# Patient Record
Sex: Male | Born: 1955 | Race: White | Hispanic: No | Marital: Married | Smoking: Former smoker
Health system: Southern US, Community
[De-identification: ages and names within clinical notes are randomized; demographics above are authoritative.]

## PROBLEM LIST (undated history)

## (undated) DIAGNOSIS — M1990 Unspecified osteoarthritis, unspecified site: Secondary | ICD-10-CM

## (undated) DIAGNOSIS — E039 Hypothyroidism, unspecified: Secondary | ICD-10-CM

## (undated) DIAGNOSIS — K6389 Other specified diseases of intestine: Secondary | ICD-10-CM

## (undated) DIAGNOSIS — C801 Malignant (primary) neoplasm, unspecified: Secondary | ICD-10-CM

## (undated) DIAGNOSIS — D1391 Familial adenomatous polyposis: Secondary | ICD-10-CM

## (undated) DIAGNOSIS — E785 Hyperlipidemia, unspecified: Secondary | ICD-10-CM

## (undated) DIAGNOSIS — I10 Essential (primary) hypertension: Secondary | ICD-10-CM

## (undated) DIAGNOSIS — R0981 Nasal congestion: Secondary | ICD-10-CM

## (undated) DIAGNOSIS — R011 Cardiac murmur, unspecified: Secondary | ICD-10-CM

## (undated) DIAGNOSIS — D126 Benign neoplasm of colon, unspecified: Secondary | ICD-10-CM

## (undated) DIAGNOSIS — D649 Anemia, unspecified: Secondary | ICD-10-CM

## (undated) HISTORY — DX: Benign neoplasm of colon, unspecified: D12.6

## (undated) HISTORY — PX: UPPER GASTROINTESTINAL ENDOSCOPY: SHX188

## (undated) HISTORY — PX: WISDOM TOOTH EXTRACTION: SHX21

## (undated) HISTORY — DX: Unspecified osteoarthritis, unspecified site: M19.90

## (undated) HISTORY — DX: Other specified diseases of intestine: K63.89

## (undated) HISTORY — DX: Familial adenomatous polyposis: D13.91

## (undated) HISTORY — DX: Nasal congestion: R09.81

## (undated) HISTORY — DX: Essential (primary) hypertension: I10

## (undated) HISTORY — DX: Hyperlipidemia, unspecified: E78.5

---

## 1996-08-06 HISTORY — PX: KNEE ARTHROSCOPY: SUR90

## 1996-12-04 ENCOUNTER — Encounter: Payer: Self-pay | Admitting: Family Medicine

## 1997-05-06 ENCOUNTER — Encounter: Payer: Self-pay | Admitting: Family Medicine

## 1998-05-06 ENCOUNTER — Encounter: Payer: Self-pay | Admitting: Family Medicine

## 1998-05-06 LAB — CONVERTED CEMR LAB: Hgb A1c MFr Bld: 7.3 %

## 1999-05-07 ENCOUNTER — Encounter: Payer: Self-pay | Admitting: Family Medicine

## 2000-03-06 ENCOUNTER — Encounter: Payer: Self-pay | Admitting: Family Medicine

## 2000-08-28 ENCOUNTER — Other Ambulatory Visit: Admission: RE | Admit: 2000-08-28 | Discharge: 2000-08-28 | Payer: Self-pay | Admitting: Gastroenterology

## 2000-08-28 ENCOUNTER — Encounter (INDEPENDENT_AMBULATORY_CARE_PROVIDER_SITE_OTHER): Payer: Self-pay

## 2000-09-06 ENCOUNTER — Encounter: Payer: Self-pay | Admitting: Family Medicine

## 2000-09-06 LAB — CONVERTED CEMR LAB
Hgb A1c MFr Bld: 6.3 %
Microalbumin U total vol: 3.1 mg/L

## 2001-03-06 ENCOUNTER — Encounter: Payer: Self-pay | Admitting: Family Medicine

## 2001-03-06 LAB — CONVERTED CEMR LAB: Hgb A1c MFr Bld: 6.1 %

## 2001-05-06 ENCOUNTER — Encounter: Payer: Self-pay | Admitting: Family Medicine

## 2001-05-06 LAB — CONVERTED CEMR LAB: Hgb A1c MFr Bld: 4.9 %

## 2001-11-04 ENCOUNTER — Encounter: Payer: Self-pay | Admitting: Family Medicine

## 2001-11-04 LAB — CONVERTED CEMR LAB: Microalbumin U total vol: 2.1 mg/L

## 2002-04-06 ENCOUNTER — Encounter: Payer: Self-pay | Admitting: Family Medicine

## 2003-03-07 ENCOUNTER — Encounter: Payer: Self-pay | Admitting: Family Medicine

## 2004-01-05 ENCOUNTER — Encounter: Payer: Self-pay | Admitting: Family Medicine

## 2004-06-23 ENCOUNTER — Ambulatory Visit: Payer: Self-pay | Admitting: Family Medicine

## 2004-07-06 ENCOUNTER — Encounter: Payer: Self-pay | Admitting: Family Medicine

## 2004-07-26 ENCOUNTER — Ambulatory Visit: Payer: Self-pay | Admitting: Family Medicine

## 2004-07-28 ENCOUNTER — Ambulatory Visit: Payer: Self-pay | Admitting: Family Medicine

## 2005-02-03 ENCOUNTER — Encounter: Payer: Self-pay | Admitting: Family Medicine

## 2005-02-03 LAB — CONVERTED CEMR LAB: Hgb A1c MFr Bld: 6.8 %

## 2005-02-13 ENCOUNTER — Ambulatory Visit: Payer: Self-pay | Admitting: Family Medicine

## 2005-11-04 ENCOUNTER — Encounter: Payer: Self-pay | Admitting: Family Medicine

## 2005-11-04 LAB — CONVERTED CEMR LAB: Microalbumin U total vol: 4.6 mg/L

## 2005-11-05 ENCOUNTER — Ambulatory Visit: Payer: Self-pay | Admitting: Family Medicine

## 2005-11-07 ENCOUNTER — Ambulatory Visit: Payer: Self-pay | Admitting: Family Medicine

## 2006-04-10 ENCOUNTER — Ambulatory Visit: Payer: Self-pay | Admitting: Family Medicine

## 2006-04-10 LAB — CONVERTED CEMR LAB
Hgb A1c MFr Bld: 6.6 %
PSA: 0.03 ng/mL

## 2006-04-12 ENCOUNTER — Ambulatory Visit: Payer: Self-pay | Admitting: Family Medicine

## 2006-06-26 ENCOUNTER — Ambulatory Visit: Payer: Self-pay | Admitting: Gastroenterology

## 2007-02-27 ENCOUNTER — Encounter: Payer: Self-pay | Admitting: Family Medicine

## 2007-02-27 DIAGNOSIS — E669 Obesity, unspecified: Secondary | ICD-10-CM

## 2007-02-27 DIAGNOSIS — L259 Unspecified contact dermatitis, unspecified cause: Secondary | ICD-10-CM | POA: Insufficient documentation

## 2007-02-27 DIAGNOSIS — D126 Benign neoplasm of colon, unspecified: Secondary | ICD-10-CM

## 2007-03-27 ENCOUNTER — Ambulatory Visit: Payer: Self-pay | Admitting: Family Medicine

## 2007-06-02 ENCOUNTER — Ambulatory Visit: Payer: Self-pay | Admitting: Family Medicine

## 2007-07-29 ENCOUNTER — Ambulatory Visit: Payer: Self-pay | Admitting: Family Medicine

## 2007-07-29 LAB — CONVERTED CEMR LAB: Hgb A1c MFr Bld: 7.4 % — ABNORMAL HIGH (ref 4.6–6.0)

## 2007-08-04 ENCOUNTER — Ambulatory Visit: Payer: Self-pay | Admitting: Family Medicine

## 2007-08-04 DIAGNOSIS — I10 Essential (primary) hypertension: Secondary | ICD-10-CM

## 2007-08-14 ENCOUNTER — Telehealth (INDEPENDENT_AMBULATORY_CARE_PROVIDER_SITE_OTHER): Payer: Self-pay | Admitting: *Deleted

## 2008-02-12 ENCOUNTER — Encounter: Admission: RE | Admit: 2008-02-12 | Discharge: 2008-02-12 | Payer: Self-pay | Admitting: Family Medicine

## 2008-02-12 ENCOUNTER — Ambulatory Visit: Payer: Self-pay | Admitting: Family Medicine

## 2008-04-08 ENCOUNTER — Ambulatory Visit: Payer: Self-pay | Admitting: Family Medicine

## 2008-04-08 LAB — CONVERTED CEMR LAB
Albumin: 3.5 g/dL (ref 3.5–5.2)
Alkaline Phosphatase: 65 units/L (ref 39–117)
BUN: 21 mg/dL (ref 6–23)
Cholesterol: 138 mg/dL (ref 0–200)
GFR calc Af Amer: 154 mL/min
Glucose, Bld: 144 mg/dL — ABNORMAL HIGH (ref 70–99)
HDL: 27.6 mg/dL — ABNORMAL LOW (ref >39.0)
Microalb Creat Ratio: 3.8 mg/g (ref 0.0–30.0)
Potassium: 4.2 meq/L (ref 3.5–5.1)
Total Protein: 7.9 g/dL (ref 6.0–8.3)
Triglycerides: 97 mg/dL (ref 0–149)
VLDL: 19 mg/dL (ref 0–40)

## 2008-04-13 ENCOUNTER — Ambulatory Visit: Payer: Self-pay | Admitting: Family Medicine

## 2008-04-30 ENCOUNTER — Ambulatory Visit: Payer: Self-pay | Admitting: Family Medicine

## 2008-04-30 LAB — CONVERTED CEMR LAB
OCCULT 1: NEGATIVE
OCCULT 2: NEGATIVE

## 2008-05-03 ENCOUNTER — Encounter (INDEPENDENT_AMBULATORY_CARE_PROVIDER_SITE_OTHER): Payer: Self-pay | Admitting: *Deleted

## 2008-06-07 ENCOUNTER — Ambulatory Visit: Payer: Self-pay | Admitting: Family Medicine

## 2008-07-09 ENCOUNTER — Ambulatory Visit: Payer: Self-pay | Admitting: Family Medicine

## 2008-07-09 LAB — CONVERTED CEMR LAB
GFR calc Af Amer: 154 mL/min
GFR calc non Af Amer: 127 mL/min
Glucose, Bld: 119 mg/dL — ABNORMAL HIGH (ref 70–99)
Potassium: 4.1 meq/L (ref 3.5–5.1)
Sodium: 139 meq/L (ref 135–145)

## 2008-07-22 ENCOUNTER — Ambulatory Visit: Payer: Self-pay | Admitting: Family Medicine

## 2008-09-03 ENCOUNTER — Encounter: Payer: Self-pay | Admitting: Family Medicine

## 2008-09-07 ENCOUNTER — Ambulatory Visit: Payer: Self-pay | Admitting: Family Medicine

## 2008-10-20 ENCOUNTER — Ambulatory Visit: Payer: Self-pay | Admitting: Family Medicine

## 2008-10-21 ENCOUNTER — Encounter: Payer: Self-pay | Admitting: Family Medicine

## 2009-03-02 ENCOUNTER — Ambulatory Visit: Payer: Self-pay | Admitting: Family Medicine

## 2009-03-02 LAB — CONVERTED CEMR LAB: Hgb A1c MFr Bld: 9.9 % — ABNORMAL HIGH (ref 4.6–6.5)

## 2009-03-08 ENCOUNTER — Ambulatory Visit: Payer: Self-pay | Admitting: Family Medicine

## 2009-04-19 ENCOUNTER — Ambulatory Visit: Payer: Self-pay | Admitting: Family Medicine

## 2009-04-24 LAB — CONVERTED CEMR LAB: ALT: 28 units/L (ref 0–53)

## 2009-06-09 ENCOUNTER — Ambulatory Visit: Payer: Self-pay | Admitting: Family Medicine

## 2009-06-16 ENCOUNTER — Ambulatory Visit: Payer: Self-pay | Admitting: Family Medicine

## 2009-09-13 ENCOUNTER — Ambulatory Visit: Payer: Self-pay | Admitting: Family Medicine

## 2009-09-13 LAB — CONVERTED CEMR LAB
Alkaline Phosphatase: 54 units/L (ref 39–117)
BUN: 11 mg/dL (ref 6–23)
Basophils Absolute: 0 10*3/uL (ref 0.0–0.1)
Basophils Relative: 0.2 % (ref 0.0–3.0)
Bilirubin, Direct: 0.1 mg/dL (ref 0.0–0.3)
CO2: 30 meq/L (ref 19–32)
Chloride: 107 meq/L (ref 96–112)
Creatinine, Ser: 0.7 mg/dL (ref 0.4–1.5)
Glucose, Bld: 133 mg/dL — ABNORMAL HIGH (ref 70–99)
HCT: 38.1 % — ABNORMAL LOW (ref 39.0–52.0)
Hemoglobin: 12.4 g/dL — ABNORMAL LOW (ref 13.0–17.0)
LDL Cholesterol: 56 mg/dL (ref 0–99)
Lymphs Abs: 1.3 10*3/uL (ref 0.7–4.0)
MCHC: 32.5 g/dL (ref 30.0–36.0)
Microalb Creat Ratio: 6.7 mg/g (ref 0.0–30.0)
Monocytes Relative: 8.2 % (ref 3.0–12.0)
Neutro Abs: 3 10*3/uL (ref 1.4–7.7)
RDW: 14 % (ref 11.5–14.6)
TSH: 2.62 microintl units/mL (ref 0.35–5.50)
Total CHOL/HDL Ratio: 3

## 2009-09-19 ENCOUNTER — Ambulatory Visit: Payer: Self-pay | Admitting: Family Medicine

## 2009-09-21 ENCOUNTER — Telehealth: Payer: Self-pay | Admitting: Family Medicine

## 2009-10-03 ENCOUNTER — Ambulatory Visit: Payer: Self-pay | Admitting: Family Medicine

## 2009-10-03 LAB — CONVERTED CEMR LAB
OCCULT 1: NEGATIVE
OCCULT 2: NEGATIVE

## 2009-10-10 ENCOUNTER — Encounter (INDEPENDENT_AMBULATORY_CARE_PROVIDER_SITE_OTHER): Payer: Self-pay | Admitting: *Deleted

## 2009-10-10 ENCOUNTER — Encounter: Payer: Self-pay | Admitting: Family Medicine

## 2009-12-19 ENCOUNTER — Ambulatory Visit: Payer: Self-pay | Admitting: Family Medicine

## 2009-12-22 ENCOUNTER — Ambulatory Visit: Payer: Self-pay | Admitting: Family Medicine

## 2010-03-14 ENCOUNTER — Encounter (INDEPENDENT_AMBULATORY_CARE_PROVIDER_SITE_OTHER): Payer: Self-pay | Admitting: *Deleted

## 2010-06-26 ENCOUNTER — Ambulatory Visit: Payer: Self-pay | Admitting: Family Medicine

## 2010-06-26 LAB — CONVERTED CEMR LAB: Hgb A1c MFr Bld: 10.9 % — ABNORMAL HIGH (ref 4.6–6.5)

## 2010-06-28 ENCOUNTER — Ambulatory Visit: Payer: Self-pay | Admitting: Family Medicine

## 2010-09-05 NOTE — Assessment & Plan Note (Signed)
Summary: F/U/CLE   Vital Signs:  Patient profile:   55 year old male Height:      69 inches Weight:      290.75 pounds BMI:     43.09 Temp:     76 degrees F oral Pulse rate:   76 / minute Pulse rhythm:   regular BP sitting:   118 / 74  (left arm) Cuff size:   large  Vitals Entered By: Sydell Axon LPN (June 28, 2010 8:54 AM) CC: follow-up visit   History of Present Illness: Pt here for recheck. He is still having difficulty with comitting to exercise and his diet is still not well controlled. A1C has gone up. He is down 20 pounds.  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 He has no complaints except a rash on his bottom. He has been using Lotrimin. He feels well although he has some congestion.  Allergies: 1)  Motrin (Ibuprofen)   Impression & Recommendations:  Problem # 1:  DIABETES MELLITUS, TYPE II (ICD-250.00)  His updated medication list for this problem includes:    Metformin Hcl 1000 Mg Tabs (Metformin hcl) .Marland Kitchen... 11/2  tabs by mouth two times a day    Amaryl 2 Mg Tabs (Glimepiride) .Marland Kitchen... Take 1 tablet by mouth two times a day    Lisinopril 5 Mg Tabs (Lisinopril) ..... One tab by mouth at night.    Onglyza 5 Mg Tabs (Saxagliptin hcl) .Marland Kitchen... Take one by mouth daily  Complete Medication List: 1)  Metformin Hcl 1000 Mg Tabs (Metformin hcl) .Marland Kitchen.. 11/2  tabs by mouth two times a day 2)  Crestor 10 Mg Tabs (Rosuvastatin calcium) .... Take 1 tablet by mouth at bedtime 3)  Amaryl 2 Mg Tabs (Glimepiride) .... Take 1 tablet by mouth two times a day 4)  Flonase 50 Mcg/act Susp (Fluticasone propionate) .... Spray 1 spray into both nostrils once a day 5)  Clotrimazole-betamethasone 1-0.05 % Crea (Clotrimazole-betamethasone) .... Use as directed two times a day 6)  Accu-chek Aviva Strp (Glucose blood) .... Use to test sugar two times a day and as needed 7)  Accu-chek Soft Touch Lancets Misc (Lancets) .... Use to test sugar two times a day  and as needed 8)  Lisinopril 5 Mg Tabs (Lisinopril) .... One tab by mouth at night. 9)  Onglyza 5 Mg Tabs (Saxagliptin hcl) .... Take one by mouth daily  Patient Instructions: 1)  RTC 2/12 for Comp Exam, labs prior Prescriptions: AMARYL 2 MG TABS (GLIMEPIRIDE) Take 1 tablet by mouth two times a day  #60 x 12   Entered and Authorized by:   Shaune Leeks MD   Signed by:   Shaune Leeks MD on 06/28/2010   Method used:   Electronically to        [facility]. [address] [phone]* (retail)       [address] [place], [place]  [id number]       Ph: [phone]       Fax: [phone]   RxID:   [id number] METFORMIN HCL 1000 MG TABS (METFORMIN HCL) 11/2  tabs by mouth two times a day  #90 x 12   Entered and Authorized by:   Shaune Leeks MD   Signed by:   Shaune Leeks MD on 06/28/2010   Method used:   Electronically to        [facility]. [facility] #  [id number]* (retail)       [address] [place], [place]  [id number]       Ph: [phone]       Fax: [phone]   RxID:   [id number]    Orders Added: 1)  Est. Patient Level III [37628]    Current Allergies (reviewed today): MOTRIN (IBUPROFEN)  Appended Document: F/U/CLE     Clinical Lists Changes  Problems: Assessed DIABETES MELLITUS, TYPE II as comment only - Increase Amaryl and Metformin over the next few weeks. Start monitoring sugar throughout the day as discussed. His updated medication list for this problem includes:    Metformin Hcl 1000 Mg Tabs (Metformin hcl) .Marland Kitchen... 11/2  tabs by mouth two times a day    Amaryl 2 Mg Tabs (Glimepiride) .Marland Kitchen... Take 1 tablet by mouth two times a day    Lisinopril 5 Mg Tabs (Lisinopril) ..... One tab by mouth at night.    Onglyza 5 Mg Tabs (Saxagliptin hcl) .Marland Kitchen... Take one by mouth daily  Observations: Added new observation of HEART EXAM: Normal rate and regular rhythm. S1 and S2 normal without gallop, murmur, click, rub or other extra  sounds. (06/28/2010 9:16) Added new observation of LUNG EXAM: Normal respiratory effort, chest expands symmetrically. Lungs are clear to auscultation, no crackles or wheezes. (06/28/2010 9:16) Added new observation of NECK EXAM: No deformities, masses, or tenderness noted. (06/28/2010 9:16) Added new observation of ORAL EXAM: Oral mucosa and oropharynx without lesions or exudates.  Teeth in good repair. (06/28/2010 9:16) Added new observation of NOSE EXAM: External nasal examination shows no deformity or inflammation. Nasal mucosa are pink and moist without lesions or exudates. (06/28/2010 9:16) Added new observation of EAR EXAM: External ear exam shows no significant lesions or deformities.  Otoscopic examination reveals clear canals, tympanic membranes are intact bilaterally without bulging, retraction, inflammation or discharge. Hearing is grossly normal bilaterally. R ear nml altho feels stopped up. (06/28/2010 9:16) Added new observation of EYE EXAM: Conjunctiva clear bilaterally.  (06/28/2010 9:16) Added new observation of HD/FACE INSP: Normocephalic and atraumatic without obvious abnormalities. No apparent alopecia or balding but thinning of the hair (06/28/2010 9:16) Added new observation of PEADULT: Shaune Leeks MD ~General`Gen appear ~Head`hd/face insp ~Eyes`Eye exam ~Ears`Ear exam ~Nose`Nose exam ~Mouth`Oral exam ~Neck`NECK EXAM ~Lungs`lung exam ~Heart`Heart exam (06/28/2010 9:16) Added new observation of GEN APPEAR: Well-developed,well-nourished,in no acute distress; alert,appropriate and cooperative throughout examination, obese but lighter. (06/28/2010 9:16)        Physical Exam  General:  Well-developed,well-nourished,in no acute distress; alert,appropriate and cooperative throughout examination, obese but lighter. Head:  Normocephalic and atraumatic without obvious abnormalities. No apparent alopecia or balding but thinning of the hair Eyes:  Conjunctiva clear  bilaterally.  Ears:  External ear exam shows no significant lesions or deformities.  Otoscopic examination reveals clear canals, tympanic membranes are intact bilaterally without bulging, retraction, inflammation or discharge. Hearing is grossly normal bilaterally. R ear nml altho feels stopped up. Nose:  External nasal examination shows no deformity or inflammation. Nasal mucosa are pink and moist without lesions or exudates. Mouth:  Oral mucosa and oropharynx without lesions or exudates.  Teeth in good repair. Neck:  No deformities, masses, or tenderness noted. Lungs:  Normal respiratory effort, chest expands symmetrically. Lungs are clear to auscultation, no crackles or wheezes. Heart:  Normal rate and regular rhythm. S1 and S2 normal without gallop, murmur, click, rub or other extra sounds.   Impression & Recommendations:  Problem #  1:  DIABETES MELLITUS, TYPE II (ICD-250.00) Increase Amaryl and Metformin over the next few weeks. Start monitoring sugar throughout the day as discussed. His updated medication list for this problem includes:    Metformin Hcl 1000 Mg Tabs (Metformin hcl) .Marland Kitchen... 11/2  tabs by mouth two times a day    Amaryl 2 Mg Tabs (Glimepiride) .Marland Kitchen... Take 1 tablet by mouth two times a day    Lisinopril 5 Mg Tabs (Lisinopril) ..... One tab by mouth at night.    Onglyza 5 Mg Tabs (Saxagliptin hcl) .Marland Kitchen... Take one by mouth daily  Complete Medication List: 1)  Metformin Hcl 1000 Mg Tabs (Metformin hcl) .Marland Kitchen.. 11/2  tabs by mouth two times a day 2)  Crestor 10 Mg Tabs (Rosuvastatin calcium) .... Take 1 tablet by mouth at bedtime 3)  Amaryl 2 Mg Tabs (Glimepiride) .... Take 1 tablet by mouth two times a day 4)  Flonase 50 Mcg/act Susp (Fluticasone propionate) .... Spray 1 spray into both nostrils once a day 5)  Clotrimazole-betamethasone 1-0.05 % Crea (Clotrimazole-betamethasone) .... Use as directed two times a day 6)  Accu-chek Aviva Strp (Glucose blood) .... Use to test sugar two  times a day and as needed 7)  Accu-chek Soft Touch Lancets Misc (Lancets) .... Use to test sugar two times a day and as needed 8)  Lisinopril 5 Mg Tabs (Lisinopril) .... One tab by mouth at night. 9)  Onglyza 5 Mg Tabs (Saxagliptin hcl) .... Take one by mouth daily

## 2010-09-05 NOTE — Assessment & Plan Note (Signed)
Summary: F/U AFTER LABS / LFW   Vital Signs:  Patient profile:   55 year old male Weight:      310 pounds Temp:     98.2 degrees F oral Pulse rate:   76 / minute Pulse rhythm:   regular BP sitting:   128 / 70  (left arm) Cuff size:   large  Vitals Entered By: Sydell Axon LPN (Dec 22, 2009 9:48 AM) CC: 3 Month follow-up after labs   History of Present Illness: Pt here for three month recheck. We stopped Actos and started Onglyza last time and he has indeed lost 8 pounds but has increased his A1C to 9.2. He feels better but sugar is needing to be  better. He feels well and has no complaints today.  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 One Tab By Mouth Two Times A Day 2)  Crestor 10 Mg Tabs (Rosuvastatin Calcium) .... Take 1 Tablet By Mouth At Bedtime 3)  Amaryl 2 Mg Tabs (Glimepiride) .... Take 1 /2tablet By Mouth At Supper 4)  Flonase 50 Mcg/act Susp (Fluticasone Propionate) .... Spray 1 Spray Into Both Nostrils Once A Day 5)  Clotrimazole-Betamethasone 1-0.05 %  Crea (Clotrimazole-Betamethasone) .... Use As Directed Two Times A Day 6)  Accu-Chek Aviva   Strp (Glucose Blood) .... Use To Test Sugar Two Times A Day and As Needed 7)  Accu-Chek Soft Touch Lancets   Misc (Lancets) .... Use To Test Sugar Two Times A Day and As Needed 8)  Lisinopril 5 Mg Tabs (Lisinopril) .... One Tab By Mouth At Night. 9)   Onglyza 5 Mg Tabs (Saxagliptin Hcl) .... 1/2 Tab By Mouth Once Daily For 6 Days and Then One Tab A Day.  Allergies: 1)  Motrin (Ibuprofen)  Physical Exam  General:  Well-developed,well-nourished,in no acute distress; alert,appropriate and cooperative throughout examination, obese. Head:  Normocephalic and atraumatic without obvious abnormalities. No apparent alopecia or balding but thinning of the hair Eyes:  Conjunctiva clear bilaterally.  Ears:  External ear exam shows no significant lesions or deformities.  Otoscopic examination reveals clear canals, tympanic membranes are intact bilaterally without bulging, retraction, inflammation or discharge. Hearing is grossly normal bilaterally. R ear nml altho feels stopped up. Nose:  External nasal examination shows no deformity or inflammation. Nasal mucosa are pink and moist without lesions or exudates. Mouth:  Oral mucosa and oropharynx without lesions or exudates.  Teeth in good repair. Neck:  No deformities, masses, or tenderness noted. Lungs:  Normal respiratory effort, chest expands symmetrically. Lungs are clear to auscultation, no crackles or wheezes. Heart:  Normal rate and regular rhythm. S1 and S2 normal without gallop, murmur, click, rub or other extra sounds.   Impression & Recommendations:  Problem # 1:  DIABETES MELLITUS, TYPE II (ICD-250.00) Has gone way up. Needs to be more careful with diet. Onglyza obviouslky not as powerful as Actos...will allow further time for wt  loss and assess in the Fall. Cont wt loss efforts. His updated medication list for this problem includes:    Metformin Hcl 1000 Mg Tabs (Metformin hcl) ..... One tab by mouth two times a day    Amaryl 2 Mg Tabs (Glimepiride) .Marland Kitchen... Take 1 /2tablet by mouth at supper    Lisinopril 5 Mg Tabs (Lisinopril) ..... One tab by mouth at night.    Onglyza 5 Mg Tabs (Saxagliptin hcl) .Marland Kitchen... Take one by mouth daily  Labs Reviewed: Creat: 0.7 (09/13/2009)   Microalbumin: 2.9  (04/10/2006)  Last Eye Exam: normal (10/10/2009) Reviewed HgBA1c results: 9.2 (12/19/2009)  7.7 (09/13/2009)  Problem # 2:  OBESITY (ICD-278.00) Assessment: Improved  8 pounds is good start. Cont efforts off Actos.  Ht: 69 (03/08/2009)   Wt: 310 (12/22/2009)   BMI: 47.13 (09/19/2009)  Problem # 3:  HYPERTENSION, BENIGN ESSENTIAL (ICD-401.1) Assessment: Unchanged Stable. His updated medication list for this problem includes:    Lisinopril 5 Mg Tabs (Lisinopril) ..... One tab by mouth at night.  BP today: 128/70 Prior BP: 130/68 (09/19/2009)  Labs Reviewed: K+: 4.2 (09/13/2009) Creat: : 0.7 (09/13/2009)   Chol: 116 (09/13/2009)   HDL: 39.60 (09/13/2009)   LDL: 56 (09/13/2009)   TG: 102.0 (09/13/2009)  Complete Medication List: 1)  Metformin Hcl 1000 Mg Tabs (Metformin hcl) .... One tab by mouth two times a day 2)  Crestor 10 Mg Tabs (Rosuvastatin calcium) .... Take 1 tablet by mouth at bedtime 3)  Amaryl 2 Mg Tabs (Glimepiride) .... Take 1 /2tablet by mouth at supper 4)  Flonase 50 Mcg/act Susp (Fluticasone propionate) .... Spray 1 spray into both nostrils once a day 5)  Clotrimazole-betamethasone 1-0.05 % Crea (Clotrimazole-betamethasone) .... Use as directed two times a day 6)  Accu-chek Aviva Strp (Glucose blood) .... Use to test sugar two times a day and as needed 7)  Accu-chek Soft Touch Lancets Misc (Lancets) .... Use to test sugar two times a day and as needed 8)  Lisinopril 5 Mg Tabs (Lisinopril) .... One tab by mouth at night. 9)  Onglyza 5 Mg Tabs (Saxagliptin hcl) .... Take one by mouth daily  Patient Instructions: 1)  Call in late Jun for appt in Nov. A1C prior.   Current Allergies (reviewed today): MOTRIN (IBUPROFEN)

## 2010-09-05 NOTE — Progress Notes (Signed)
Summary: Prior Authorization for Accu-check Aviva test strips  Phone Note From Pharmacy Call back at [phone]   Caller: [facility] [address] [phone]* Call For: Dr. Hetty Ely  Summary of Call: Received fax from pharmacy stating that PA is needed for Accu-Check Aviva Test Strips.  Called [employer] and was advised by Marian Sorrow that 459 test strips are covered per 90 days for the patient if he is on insulin, 153 test strips are covered per 90 days if the patient is not on insulin.  The patient is not on insulin.  No prior authorization offered for any additional quantity.  Patient and pharmacy notified. Initial call taken by: Linde Gillis CMA Duncan Dull),  September 21, 2009 4:26 PM

## 2010-09-05 NOTE — Letter (Signed)
Summary: Results Follow up Letter  [place] at [facility]  [address] [place], [place] [address]   Phone: [phone]  Fax: [phone]    10/10/2009 MRN: [id number]  Cory Harper [address] [place], [place]  [address]  Dear Cory Harper,  The following are the results of your recent test(s):  Test         Result    Pap Smear:        Normal _____  Not Normal _____ Comments: ______________________________________________________ Cholesterol: LDL(Bad cholesterol):         Your goal is less than:         HDL (Good cholesterol):       Your goal is more than: Comments:  ______________________________________________________ Mammogram:        Normal _____  Not Normal _____ Comments:  ___________________________________________________________________ Hemoccult:        Normal _X____  Not normal _______ Comments:  Please repeat in one year. _____________________________________________________________________ Other Tests:    We routinely do not discuss normal results over the telephone.  If you desire a copy of the results, or you have any questions about this information we can discuss them at your next office visit.   Sincerely,

## 2010-09-05 NOTE — Letter (Signed)
Summary: Homero Fellers Spaeth,O.D.,[facility] Greggory Keen Spaeth,O.D.,[facility],Note   Imported By: Beau Fanny 11/23/2009 16:00:16  _____________________________________________________________________  External Attachment:    Type:   Image     Comment:   External Document  Appended Document: [facility],Note     Clinical Lists Changes  Observations: Added new observation of DMEYEEXAMNXT: 10/2010 (11/23/2009 17:02) Added new observation of DMEYEEXMRES: normal (10/10/2009 17:03) Added new observation of EYE EXAM BY: Dr Marlyne Beards (10/10/2009 17:03) Added new observation of DIAB EYE EX: normal (10/10/2009 17:03)        Diabetes Management Exam:    Eye Exam:       Eye Exam done elsewhere          Date: 10/10/2009          Results: normal          Done by: Dr Marlyne Beards

## 2010-09-05 NOTE — Letter (Signed)
Summary: Nadara Eaton letter  [place] at [facility]  [address] [place], [place] [address]   Phone: [phone]  Fax: [phone]       03/14/2010 MRN: [id number]  KYRIAN STAGE [address] [place], [place]  [address]  Dear Mr. JAYSEN, [facility] and [facility] announce the retirement of Arta Silence, M.D., from full-time practice at the [facility] office effective February 02, 2010 and his plans of returning part-time.  It is important to Dr. Hetty Ely and to our practice that you understand that [facility] has seven physicians in our office for your health care needs.  We will continue to offer the same exceptional care that you have today.    Dr. Hetty Ely has spoken to many of you about his plans for retirement and returning part-time in the fall.   We will continue to work with you through the transition to schedule appointments for you in the office and meet the high standards that [facility] is committed to.   Again, it is with great pleasure that we share the news that Dr. Hetty Ely will return to [facility] in October of 2011 with a reduced schedule.    If you have any questions, or would like to request an appointment with one of our physicians, please call us at [phone] and press the option for Scheduling an appointment.  We take pleasure in providing you with excellent patient care and look forward to seeing you at your next office visit.  Our [facility] Physicians are:  Tillman Abide, M.D. Laurita Quint, M.D. Roxy Manns, M.D. Kerby Nora, M.D. Hannah Beat, M.D. Ruthe Mannan, M.D. We proudly welcomed Raechel Ache, M.D. and Eustaquio Boyden, M.D. to the practice in July/August 2011.  Sincerely,  [facility]

## 2010-09-05 NOTE — Assessment & Plan Note (Signed)
Summary: CPX/DLO   Vital Signs:  Patient profile:   55 year old male Weight:      318 pounds BMI:     47.13 Temp:     98.2 degrees F oral Pulse rate:   76 / minute Pulse rhythm:   regular BP sitting:   130 / 68  (left arm) Cuff size:   large  Vitals Entered By: Sydell Axon LPN (September 19, 2009 9:25 AM) CC: 30 Minute checkup, hemoccult cards given to patient, had a colonoscopy by Dr. Russella Dar several years ago per patient   History of Present Illness: Pt here for Comp Exam. He is concerned over weight gain. He is on Actos and knows weight loss is a possibility. He has an exercise bike. He uses it 15 mins a day. He does not get SOB however and does not sweat. He has no problems and no complaints.  He also has made no decision on colonoscopy.  He has poss familial polyposis.   Preventive Screening-Counseling & Management  Alcohol-Tobacco     Alcohol drinks/day: 0     Smoking Status: quit     Year Quit: 1993     Pack years: 2     Passive Smoke Exposure: no  Caffeine-Diet-Exercise     Caffeine use/day: 1     Does Patient Exercise: yes     Type of exercise: exercise bike     Exercise (avg: min/session):     Times/week: 3  Problems Prior to Update: 1)  Special Screening Malig Neoplasms Other Sites  (ICD-V76.49) 2)  Health Maintenance Exam  (ICD-V70.0) 3)  Special Screening Malignant Neoplasm of Prostate  (ICD-V76.44) 4)  Knee Pain, Right  (ICD-719.46) 5)  Hypertension, Benign Essential  (ICD-401.1) 6)  Obesity  (ICD-278.00) 7)  Eczema  (ICD-692.9) 8)  Colonic Polyps, Hx of  (ICD-V12.72) 9)  Hypercholesterolemia (205 TRIG 334)  (ICD-272.0) 10)  Adenocarcinoma, Colon, 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 One Tab By Mouth Two Times A Day 2)  Crestor 10 Mg Tabs (Rosuvastatin Calcium) .... Take 1 Tablet By Mouth At Bedtime 3)  Amaryl 2 Mg  Tabs (Glimepiride) .... Take 1 /2tablet By Mouth At Supper 4)  Flonase 50 Mcg/act Susp (Fluticasone Propionate) .... Spray 1 Spray Into Both Nostrils Once A Day 5)  Clotrimazole-Betamethasone 1-0.05 %  Crea (Clotrimazole-Betamethasone) .... Use As Directed Two Times A Day 6)  Accu-Chek Aviva   Strp (Glucose Blood) .... Use To Test Sugar Two Times A Day and As Needed 7)  Accu-Chek Soft Touch Lancets   Misc (Lancets) .... Use To Test Sugar Two Times A Day and As Needed 8)  Lisinopril 5 Mg Tabs (Lisinopril) .... One Tab By Mouth At Night. 9)  Actos 30 Mg Tabs (Pioglitazone Hcl) .... One Tab By Mouth Once Daily  Allergies: 1)  Motrin (Ibuprofen)  Past History:  Past Medical History: Last updated: 02/27/2007 Diabetes mellitus, type II Colonic polyps, hx of  Past Surgical History: Last updated: 02/27/2007 R Knee Arthroscopy  09/21/1998 Colonoscopy, multiple polyps, repeat 2 years 08/28/00 ECHO, LA mildly dilated, RV mildly dilated, 1+TR 10/11/2000 Doppler ECHO f/u wnl 09/30/00  Family History: Last updated: 09/19/2009 Father: A 85  Diabetes obese  Bilat Knee pain Mother:Died  60 with colon cancer  Sister A  53  divertics no polyps CV - HBP + Mother DM +  PGM, father, self Uterine cancer + Mother Colon cancer + Mother + Stroke- mother  Social History: Last updated: 09/07/2008 Marital Status: Married Children: None 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 Eyes:  Denies blurring, discharge, and eye pain. ENT:  Denies decreased hearing, earache, and ringing in ears. CV:  Denies chest pain or discomfort, difficulty breathing while lying down, fainting, palpitations, shortness of breath with exertion, and swelling of feet. Resp:  Denies cough, shortness of breath, and wheezing. GI:  Denies abdominal pain, bloody stools, change in bowel habits, constipation, dark tarry stools, diarrhea, indigestion, loss of appetite, nausea, vomiting, vomiting blood, and yellowish skin color. GU:  Denies discharge, dysuria, nocturia, and urinary frequency. MS:  Complains of joint pain; denies low back pain, muscle aches, cramps, and stiffness; knees. Derm:  Complains of dryness; denies itching; eczema. Neuro:  Complains of sensation of room spinning; denies numbness, poor balance, tingling, and tremors.  Physical Exam  General:  Well-developed,well-nourished,in no acute distress; alert,appropriate and cooperative throughout examination, obese. Head:  Normocephalic and atraumatic without obvious abnormalities. No apparent alopecia or balding but thinning of the hair Eyes:  Conjunctiva clear bilaterally.  Ears:  External ear exam shows no significant lesions or deformities.  Otoscopic examination reveals clear canals, tympanic membranes are intact bilaterally without bulging, retraction, inflammation or discharge. Hearing is grossly normal bilaterally. Nose:  External nasal examination shows no deformity or inflammation. Nasal mucosa are pink and moist without lesions or exudates. Mouth:  Oral mucosa and oropharynx without lesions or exudates.  Teeth in good repair. Neck:  No deformities, masses, or tenderness noted. Chest Wall:  No deformities, masses, tenderness or gynecomastia noted. Breasts:  No masses but mild physiologic  gynecomastia of obesity noted Lungs:  Normal respiratory effort, chest expands  symmetrically. Lungs are clear to auscultation, no crackles or wheezes. Heart:  Normal rate and regular rhythm. S1 and S2 normal without gallop, murmur, click, rub or other extra sounds. Abdomen:  Bowel sounds positive,abdomen soft and non-tender but obese and protuberant without masses, organomegaly or hernias noted. Significant panniculous. Rectal:  No external abnormalities noted. Normal sphincter tone. No rectal masses or tenderness. Gneg. Genitalia:  Testes bilaterally descended without nodularity, tenderness or masses. No scrotal masses or lesions. No penis lesions or urethral discharge. Prostate:  Prostate gland firm and smooth, no enlargement, nodularity, tenderness, mass, asymmetry or induration. 10gms. Msk:  No deformity or scoliosis noted of thoracic or lumbar spine.   Pulses:  R and L carotid,radial,femoral,dorsalis pedis and posterior tibial pulses are full and equal bilaterally Extremities:  No clubbing, cyanosis, edema, or deformity noted with normal full range of motion of all joints.  Mild hypertrophy of the superfuicial veins around the ankles bilat. Neurologic:  No cranial nerve deficits noted. Station and gait are normal. Sensory, motor and coordinative functions appear intact. Skin:  Intact without suspicious lesions or rashes Cervical Nodes:  No lymphadenopathy noted Inguinal Nodes:  No significant adenopathy Psych:  Cognition and judgment appear intact. Alert and cooperative with normal attention span and concentration. No apparent delusions, illusions, hallucinations  Diabetes Management Exam:    Foot Exam (with socks and/or shoes not present):       Sensory-Pinprick/Light touch:          Left medial foot (L-4): normal          Left dorsal foot (L-5): normal          Left lateral foot (S-1): normal          Right medial foot (L-4): normal          Right dorsal foot (L-5): normal          Right lateral foot (S-1): normal       Sensory-Monofilament:          Left foot:  normal          Right foot: normal       Inspection:          Left foot: normal          Right foot: normal       Nails:          Left foot: normal          Right foot: normal    Eye Exam:       Eye Exam done elsewhere          Date: 10/20/2008          Results: normal          Done by: Dr Marlyne Beards   Impression & Recommendations:  Problem # 1:  HEALTH MAINTENANCE EXAM (ICD-V70.0)  Encouraged to lose weight with longer more freq workouts and less intake.   Problem # 2:  DIABETES MELLITUS, TYPE II (ICD-250.00) Change Actos to Onglyza due to weight gain. He declined Byetta. His updated medication list for this problem includes:    Metformin Hcl 1000 Mg Tabs (Metformin hcl) ..... One tab by mouth two times a day    Amaryl 2 Mg Tabs (Glimepiride) .Marland Kitchen... Take 1 /2tablet by mouth at supper    Lisinopril 5 Mg Tabs (Lisinopril) ..... One tab by mouth at night.    Onglyza 5 Mg Tabs (Saxagliptin hcl) .Marland Kitchen... 1/2 tab by mouth once daily for 6 days and then one tab a day.  Labs Reviewed: Creat: 0.7 (09/13/2009)   Microalbumin: 2.9 (04/10/2006)  Last Eye Exam: normal (10/20/2008) Reviewed HgBA1c results: 7.7 (09/13/2009)  7.8 (06/09/2009)  Problem # 3:  SPECIAL SCREENING MALIGNANT NEOPLASM OF PROSTATE (ICD-V76.44) Assessment: Unchanged Stable PSA and exam.  Problem # 4:  KNEE PAIN, RIGHT (ICD-719.46) Assessment: Unchanged  Problem # 5:  HYPERTENSION, BENIGN ESSENTIAL (ICD-401.1) Assessment: Unchanged  His updated medication list for this problem includes:    Lisinopril 5 Mg Tabs (Lisinopril) ..... One tab by mouth at night.  BP today: 130/68 Prior BP: 110/64 (06/16/2009)  Labs Reviewed: K+: 4.2 (09/13/2009) Creat: : 0.7 (09/13/2009)   Chol: 116 (09/13/2009)   HDL: 39.60 (09/13/2009)   LDL: 56 (09/13/2009)   TG: 102.0 (09/13/2009)  Problem # 6:  OBESITY (ICD-278.00) Assessment: Deteriorated Discussed and Actos changed.  Problem # 7:  COLONIC POLYPS, HX OF  (ICD-V12.72) Assessment: Unchanged Again implored him to have colonoscopy.  Problem # 8:  HYPERCHOLESTEROLEMIA (205 TRIG 334) (ICD-272.0) Assessment: Unchanged Adequate. His updated medication list for this problem includes:    Crestor 10 Mg Tabs (Rosuvastatin calcium) .Marland Kitchen... Take 1 tablet  by mouth at bedtime  Labs Reviewed: SGOT: 25 (09/13/2009)   SGPT: 27 (09/13/2009)   HDL:39.60 (09/13/2009), 27.6 (04/08/2008)  LDL:56 (09/13/2009), 91 (04/08/2008)  Chol:116 (09/13/2009), 138 (04/08/2008)  Trig:102.0 (09/13/2009), 97 (04/08/2008)  Complete Medication List: 1)  Metformin Hcl 1000 Mg Tabs (Metformin hcl) .... One tab by mouth two times a day 2)  Crestor 10 Mg Tabs (Rosuvastatin calcium) .... Take 1 tablet by mouth at bedtime 3)  Amaryl 2 Mg Tabs (Glimepiride) .... Take 1 /2tablet by mouth at supper 4)  Flonase 50 Mcg/act Susp (Fluticasone propionate) .... Spray 1 spray into both nostrils once a day 5)  Clotrimazole-betamethasone 1-0.05 % Crea (Clotrimazole-betamethasone) .... Use as directed two times a day 6)  Accu-chek Aviva Strp (Glucose blood) .... Use to test sugar two times a day and as needed 7)  Accu-chek Soft Touch Lancets Misc (Lancets) .... Use to test sugar two times a day and as needed 8)  Lisinopril 5 Mg Tabs (Lisinopril) .... One tab by mouth at night. 9)  Onglyza 5 Mg Tabs (Saxagliptin hcl) .... 1/2 tab by mouth once daily for 6 days and then one tab a day.  Patient Instructions: 1)  RTC 3 mos, A1C prior 2)  STop Actos, start Onglyza 3)  Get eye exam in Mar 4)  hink about colonoscopy. Prescriptions: ONGLYZA 5 MG TABS (SAXAGLIPTIN HCL) 1/2 tab by mouth once daily for 6 days and then one tab a day.  #30 x 12   Entered and Authorized by:   Shaune Leeks MD   Signed by:   Shaune Leeks MD on 09/19/2009   Method used:   Electronically to        [facility]. [address] [phone]* (retail)       [address] [place], [place]  [id number]       Ph:  [phone]       Fax: [phone]   RxID:   [id number]   Current Allergies (reviewed today): MOTRIN (IBUPROFEN)

## 2010-09-22 ENCOUNTER — Other Ambulatory Visit: Payer: Self-pay | Admitting: Family Medicine

## 2010-09-22 ENCOUNTER — Encounter: Payer: Self-pay | Admitting: Family Medicine

## 2010-09-22 ENCOUNTER — Encounter (INDEPENDENT_AMBULATORY_CARE_PROVIDER_SITE_OTHER): Payer: Self-pay | Admitting: *Deleted

## 2010-09-22 ENCOUNTER — Other Ambulatory Visit (INDEPENDENT_AMBULATORY_CARE_PROVIDER_SITE_OTHER): Payer: BC Managed Care – PPO

## 2010-09-22 DIAGNOSIS — E7800 Pure hypercholesterolemia, unspecified: Secondary | ICD-10-CM

## 2010-09-22 DIAGNOSIS — M25569 Pain in unspecified knee: Secondary | ICD-10-CM

## 2010-09-22 DIAGNOSIS — Z125 Encounter for screening for malignant neoplasm of prostate: Secondary | ICD-10-CM

## 2010-09-22 DIAGNOSIS — I10 Essential (primary) hypertension: Secondary | ICD-10-CM

## 2010-09-22 DIAGNOSIS — E119 Type 2 diabetes mellitus without complications: Secondary | ICD-10-CM

## 2010-09-22 LAB — LIPID PANEL
Cholesterol: 109 mg/dL (ref 0–200)
HDL: 33.1 mg/dL — ABNORMAL LOW (ref >39.00)
Triglycerides: 111 mg/dL (ref 0.0–149.0)

## 2010-09-22 LAB — BASIC METABOLIC PANEL
BUN: 14 mg/dL (ref 6–23)
CO2: 28 mEq/L (ref 19–32)
Calcium: 9 mg/dL (ref 8.4–10.5)
Creatinine, Ser: 0.7 mg/dL (ref 0.4–1.5)
Glucose, Bld: 142 mg/dL — ABNORMAL HIGH (ref 70–99)

## 2010-09-22 LAB — HEPATIC FUNCTION PANEL
ALT: 34 U/L (ref 0–53)
Albumin: 3.5 g/dL (ref 3.5–5.2)
Bilirubin, Direct: 0.1 mg/dL (ref 0.0–0.3)
Total Protein: 6.5 g/dL (ref 6.0–8.3)

## 2010-09-22 LAB — HEMOGLOBIN A1C: Hgb A1c MFr Bld: 8.1 % — ABNORMAL HIGH (ref 4.6–6.5)

## 2010-09-22 LAB — PSA: PSA: 0.24 ng/mL (ref 0.10–4.00)

## 2010-09-22 LAB — MICROALBUMIN / CREATININE URINE RATIO
Creatinine,U: 26.8 mg/dL
Microalb Creat Ratio: 3.4 mg/g (ref 0.0–30.0)
Microalb, Ur: 0.9 mg/dL (ref 0.0–1.9)

## 2010-09-25 LAB — CONVERTED CEMR LAB: Vit D, 25-Hydroxy: 15 ng/mL — ABNORMAL LOW (ref 30–89)

## 2010-09-27 ENCOUNTER — Encounter (INDEPENDENT_AMBULATORY_CARE_PROVIDER_SITE_OTHER): Payer: BC Managed Care – PPO | Admitting: Family Medicine

## 2010-09-27 ENCOUNTER — Encounter: Payer: Self-pay | Admitting: Family Medicine

## 2010-09-27 DIAGNOSIS — E559 Vitamin D deficiency, unspecified: Secondary | ICD-10-CM | POA: Insufficient documentation

## 2010-09-27 DIAGNOSIS — Z0000 Encounter for general adult medical examination without abnormal findings: Secondary | ICD-10-CM

## 2010-09-27 DIAGNOSIS — E538 Deficiency of other specified B group vitamins: Secondary | ICD-10-CM | POA: Insufficient documentation

## 2010-10-03 NOTE — Assessment & Plan Note (Signed)
Summary: cpe [facility]   Vital Signs:  Patient profile:   55 year old male Weight:      292 pounds Temp:     98.1 degrees F oral Pulse rate:   84 / minute Pulse rhythm:   regular BP sitting:   120 / 72  (left arm) Cuff size:   large  Vitals Entered By: Sydell Axon LPN (September 27, 2010 9:45 AM) CC: 30 Minute checkup   History of Present Illness: Pt here for Comp Exam...he continues to exercise but has gained 2 pounds since last visit. He is trying hard to lose weight. He has been watching his sugar...was 26 when travelling to [employer].  His father has recently had pneumonia. He is having a tough time with breathing...on ventilator. 55 years old. 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  Preventive Screening-Counseling & Management  Alcohol-Tobacco     Alcohol drinks/day: 0     Smoking Status: quit     Year Quit: 1993     Pack years: 2     Passive Smoke Exposure: no  Caffeine-Diet-Exercise     Caffeine use/day: 1     Does Patient Exercise: yes     Type of exercise: exercise bike     Exercise (avg: min/session): <30     Times/week: 5  Problems Prior to Update: 1)  Special Screening Malig Neoplasms Other Sites  (ICD-V76.49) 2)  Health Maintenance Exam  (ICD-V70.0) 3)  Special Screening Malignant Neoplasm of Prostate  (ICD-V76.44) 4)  Knee Pain, Right  (ICD-719.46) 5)  Hypertension, Benign Essential  (ICD-401.1) 6)  Obesity  (ICD-278.00) 7)  Eczema  (ICD-692.9) 8)  Colonic Polyps, Hx of  (ICD-V12.72) 9)  Hypercholesterolemia (205 TRIG 334)  (ICD-272.0) 10)  Adenocarcinoma, Colon, Family Hx (MOTHER 60 YOA)  (ICD-V16.0) 11)  Diabetes Mellitus, Type II  (ICD-250.00)  Medications Prior to Update: 1)  Metformin Hcl 1000 Mg Tabs (Metformin Hcl) .Marland Kitchen.. 11/2  Tabs By Mouth Two Times A Day 2)  Crestor 10 Mg Tabs (Rosuvastatin Calcium) .... Take 1 Tablet By Mouth At Bedtime 3)  Amaryl 2 Mg Tabs (Glimepiride) .... Take 1 Tablet By Mouth Two  Times A Day 4)  Flonase 50 Mcg/act Susp (Fluticasone Propionate) .... Spray 1 Spray Into Both Nostrils Once A Day 5)  Clotrimazole-Betamethasone 1-0.05 %  Crea (Clotrimazole-Betamethasone) .... Use As Directed Two Times A Day 6)  Accu-Chek Aviva   Strp (Glucose Blood) .... Use To Test Sugar Two Times A Day and As Needed 7)  Accu-Chek Soft Touch Lancets   Misc (Lancets) .... Use To Test Sugar Two Times A Day and As Needed 8)  Lisinopril 5 Mg Tabs (Lisinopril) .... One Tab By Mouth At Night. 9)  Onglyza 5 Mg Tabs (Saxagliptin Hcl) .... Take One By Mouth Daily  Current Medications (verified): 1)  Metformin Hcl 1000 Mg Tabs (Metformin Hcl) .Marland Kitchen.. 11/2  Tabs By Mouth Two Times A Day 2)  Crestor 10 Mg Tabs (Rosuvastatin Calcium) .... Take 1 Tablet By Mouth At Bedtime 3)  Amaryl 2 Mg Tabs (Glimepiride) .... Take 1 Tablet By Mouth Two Times A Day 4)  Flonase 50 Mcg/act Susp (Fluticasone Propionate) .... Spray 1 Spray Into Both Nostrils Once A Day 5)  Clotrimazole-Betamethasone 1-0.05 %  Crea (Clotrimazole-Betamethasone) .... Use As Directed Two Times A Day 6)  Accu-Chek Aviva   Strp (Glucose Blood) .... Use To Test Sugar Two Times A Day and As Needed 7)  Accu-Chek Soft Touch Lancets   Misc (Lancets) .... Use To Test Sugar Two Times A Day and As Needed 8)  Lisinopril 5 Mg Tabs (Lisinopril) .... One Tab By Mouth At Night. 9)  Onglyza 5 Mg Tabs (Saxagliptin Hcl) .... Take One By Mouth Daily  Allergies: 1)  Motrin (Ibuprofen)  Past History:  Past Medical History: Last updated: 02/27/2007 Diabetes mellitus, type II Colonic polyps, hx of  Past Surgical History: Last updated: 02/27/2007 R Knee Arthroscopy  09/21/1998 Colonoscopy, multiple polyps, repeat 2 years 08/28/00 ECHO, LA mildly dilated, RV mildly dilated, 1+TR 10/11/2000 Doppler ECHO f/u wnl 09/30/00  Family History: Last updated: 09/27/2010 Father: A 86  Diabetes obese  Bilat Knee pain Mother:Died  60 with colon cancer  Sister A  54   divertics no polyps CV - HBP + Mother DM + PGM, father, self Uterine cancer + Mother Colon cancer + Mother + Stroke- mother  Social History: Last updated: 09/07/2008 Marital Status: Married Children: None 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 Eyes:  Denies blurring, discharge, and eye pain. ENT:  Denies decreased hearing, earache, and ringing in ears. CV:  Denies chest pain or discomfort, fainting, fatigue, palpitations, shortness of breath with exertion, swelling of feet, and swelling of hands. Resp:  Denies cough, shortness of breath, and wheezing. GI:  Denies abdominal pain, bloody stools, change in bowel habits, constipation, dark tarry stools, diarrhea, indigestion, loss of appetite, nausea, vomiting, vomiting blood, and yellowish skin color. GU:  Denies discharge, dysuria, incontinence, nocturia, and urinary frequency. MS:  Denies joint pain, low back pain, muscle aches, cramps, and stiffness. Derm:  Denies dryness, itching, and rash. Neuro:  Denies numbness, poor balance, tingling, and tremors.  Physical Exam  General:  Well-developed,well-nourished,in no acute distress; alert,appropriate and cooperative throughout examination, obese but lighter. Looks better. Head:  Normocephalic and atraumatic without obvious abnormalities. No apparent alopecia or balding but thinning of the hair Eyes:   Conjunctiva clear bilaterally.  Ears:  External ear exam shows no significant lesions or deformities.  Otoscopic examination reveals clear canals, tympanic membranes are intact bilaterally without bulging, retraction, inflammation or discharge. Hearing is grossly normal bilaterally. R ear nml altho feels stopped up. Nose:  External nasal examination shows no deformity or inflammation. Nasal mucosa are pink and moist without lesions or exudates. Mouth:  Oral mucosa and oropharynx without lesions or exudates.  Teeth in good repair. Neck:  No deformities, masses, or tenderness noted. Chest Wall:  No deformities, masses, tenderness or gynecomastia noted. Breasts:  No masses but mild physiologic  gynecomastia of obesity noted Lungs:  Normal respiratory effort, chest expands symmetrically. Lungs are clear to auscultation, no crackles or wheezes. Heart:  Normal rate and regular rhythm. S1 and S2 normal without gallop, murmur, click, rub or other extra sounds. Abdomen:  Bowel sounds positive,abdomen soft and non-tender but obese and protuberant without masses, organomegaly or hernias noted. Significant panniculous. Rectal:  No external abnormalities noted. Normal sphincter tone. No rectal masses or tenderness. Gneg. Genitalia:  Testes bilaterally descended without nodularity, tenderness or masses. No scrotal masses or lesions. No penis lesions or urethral discharge. Prostate:  Prostate gland firm and smooth, no enlargement, nodularity, tenderness, mass, asymmetry or induration. 10gms. Msk:  No deformity or scoliosis noted of thoracic or lumbar spine.   Pulses:  R and L carotid,radial,femoral,dorsalis pedis and posterior tibial pulses are full and equal bilaterally Extremities:  No clubbing, cyanosis, edema, or deformity noted with normal full range of motion of all joints.  Mild hypertrophy of the superfuicial veins around the ankles bilat. Neurologic:  No cranial nerve deficits noted. Station and gait are  normal. Sensory, motor and coordinative functions appear intact.  Diabetes Management Exam:    Foot Exam (with socks and/or shoes not present):       Sensory-Pinprick/Light touch:          Left medial foot (L-4): normal          Left dorsal foot (L-5): normal          Left lateral foot (S-1): normal          Right medial foot (L-4): normal          Right dorsal foot (L-5): normal          Right lateral foot (S-1): normal       Sensory-Monofilament:          Left foot: normal          Right foot: normal       Inspection:          Left foot: normal          Right foot: normal       Nails:          Left foot: thickened          Right foot: thickened   Impression & Recommendations:  Problem # 1:  HEALTH MAINTENANCE EXAM (ICD-V70.0) Assessment Comment Only  Reviewed preventive care protocols, scheduled due services, and updated immunizations.  Problem # 2:  SPECIAL SCREENING MALIGNANT NEOPLASM OF PROSTATE (ICD-V76.44) Assessment: Unchanged Stable PSA and exam.  Problem # 3:  HYPERTENSION, BENIGN ESSENTIAL (ICD-401.1) Assessment: Unchanged  His updated medication list for this problem includes:    Lisinopril 5 Mg Tabs (Lisinopril) ..... One tab by mouth at night.  BP today: 120/72 Prior BP: 118/74 (06/28/2010)  Labs Reviewed: K+: 4.5 (09/22/2010) Creat: : 0.7 (09/22/2010)   Chol: 109 (09/22/2010)   HDL: 33.10 (09/22/2010)   LDL: 54 (09/22/2010)   TG: 111.0 (09/22/2010)  Problem # 4:  OBESITY (ICD-278.00) Assessment: Unchanged  Stable since last visit (actually has regained 2 pounds) Encouraged to cont exercise and diet for weighy loss.  Ht: 69 (06/28/2010)   Wt: 292 (09/27/2010)   BMI: 43.09 (06/28/2010)  Problem # 5:  HYPERCHOLESTEROLEMIA (205 TRIG 334) (ICD-272.0) Assessment: Unchanged Good control on Crestor. Cont. His updated medication list for this problem includes:    Crestor 10 Mg Tabs (Rosuvastatin calcium) .Marland Kitchen... Take 1 tablet by mouth at bedtime  Labs  Reviewed: SGOT: 36 (09/22/2010)   SGPT: 34 (09/22/2010)   HDL:33.10 (09/22/2010), 39.60 (09/13/2009)  LDL:54 (09/22/2010), 56 (09/13/2009)  Chol:109 (09/22/2010), 116 (09/13/2009)  Trig:111.0 (09/22/2010), 102.0 (09/13/2009)  Problem # 6:  ADENOCARCINOMA, COLON, FAMILY HX (MOTHER 60 YOA) (ICD-V16.0) Assessment: Unchanged Still can't get him to get Colonoscopy.  Problem # 7:  DIABETES MELLITUS, TYPE II (ICD-250.00) Assessment: Improved Better  than last time but still high. Discussed approach. He wants to avoid insulin. His updated medication list for this problem includes:    Metformin Hcl 1000 Mg Tabs (Metformin hcl) .Marland Kitchen... 11/2  tabs by mouth two times a day    Amaryl 2 Mg Tabs (Glimepiride) .Marland Kitchen... Take 1 tablet by mouth two times a day    Lisinopril 5 Mg Tabs (Lisinopril) ..... One tab by mouth at night.    Onglyza 5 Mg Tabs (Saxagliptin hcl) .Marland Kitchen... Take one by mouth daily  Labs Reviewed: Creat: 0.7 (09/22/2010)   Microalbumin: 2.9 (04/10/2006)  Last Eye Exam: normal (10/10/2009) Reviewed HgBA1c results: 8.1 (09/22/2010)  10.9 (06/26/2010)  Complete Medication List: 1)  Metformin Hcl 1000 Mg Tabs (Metformin hcl) .Marland Kitchen.. 11/2  tabs by mouth two times a day 2)  Crestor 10 Mg Tabs (Rosuvastatin calcium) .... Take 1 tablet by mouth at bedtime 3)  Amaryl 2 Mg Tabs (Glimepiride) .... Take 1 tablet by mouth two times a day 4)  Flonase 50 Mcg/act Susp (Fluticasone propionate) .... Spray 1 spray into both nostrils once a day 5)  Clotrimazole-betamethasone 1-0.05 % Crea (Clotrimazole-betamethasone) .... Use as directed two times a day 6)  Accu-chek Aviva Strp (Glucose blood) .... Use to test sugar two times a day and as needed 7)  Accu-chek Soft Touch Lancets Misc (Lancets) .... Use to test sugar two times a day and as needed 8)  Lisinopril 5 Mg Tabs (Lisinopril) .... One tab by mouth at night. 9)  Onglyza 5 Mg Tabs (Saxagliptin hcl) .... Take one by mouth daily  Patient Instructions: 1)  RTC 6  mos, A1C prior 250.00 Vit D 268.9 2)  Encourage Colonoscopy again next time.   Orders Added: 1)  Est. Patient 40-64 years [[id number]]    Current Allergies (reviewed today): MOTRIN (IBUPROFEN)  Appended Document: cpe [facility]     Clinical Lists Changes  Problems: Added new problem of UNSPECIFIED VITAMIN D DEFICIENCY (ICD-268.9) Added new problem of VITAMIN B12 DEFICIENCY (ICD-266.2) Assessed VITAMIN B12 DEFICIENCY as new - Metformin probably depleting.Marland KitchenMarland KitchenStart Vit B12 oral replacement, 100mg  a day. Assessed UNSPECIFIED VITAMIN D DEFICIENCY as new - Start Vit D 1000Iu two times a day.        Impression & Recommendations:  Problem # 1:  VITAMIN B12 DEFICIENCY (ICD-266.2) Assessment New Metformin probably depleting.Marland KitchenMarland KitchenStart Vit B12 oral replacement, 100mg  a day.  Problem # 2:  UNSPECIFIED VITAMIN D DEFICIENCY (ICD-268.9) Assessment: New Start Vit D 1000Iu two times a day.  Complete Medication List: 1)  Metformin Hcl 1000 Mg Tabs (Metformin hcl) .Marland Kitchen.. 11/2  tabs by mouth two times a day 2)  Crestor 10 Mg Tabs (Rosuvastatin calcium) .... Take 1 tablet by mouth at bedtime 3)  Amaryl 2 Mg Tabs (Glimepiride) .... Take 1 tablet by mouth two times a day 4)  Flonase 50 Mcg/act Susp (Fluticasone propionate) .... Spray 1 spray into both nostrils once a day 5)  Clotrimazole-betamethasone 1-0.05 % Crea (Clotrimazole-betamethasone) .... Use as directed two times a day 6)  Accu-chek Aviva Strp (Glucose blood) .... Use to test sugar two times a day and as needed 7)  Accu-chek Soft Touch Lancets Misc (Lancets) .... Use to test sugar two times a day and as needed 8)  Lisinopril 5 Mg Tabs (Lisinopril) .... One tab by mouth at night. 9)  Onglyza 5 Mg Tabs (Saxagliptin hcl) .... Take one by mouth daily

## 2010-12-17 ENCOUNTER — Other Ambulatory Visit: Payer: Self-pay | Admitting: Family Medicine

## 2010-12-22 NOTE — Assessment & Plan Note (Signed)
Cory Harper                           GASTROENTEROLOGY OFFICE NOTE   Cory Harper, Cory Harper                       MRN:          [id number]  DATE:06/26/2006                            DOB:          08-07-1955    REFERRING PHYSICIAN:  Arta Silence, MD   REASON FOR REFERRAL:  Familial adenomatous polyposis syndrome.   HISTORY OF PRESENT ILLNESS:  [name] is a 55 year old white male who was  previously evaluated in early 2002 for a family history of colon cancer in  his mother at age 25.  He underwent colonoscopy in January 2002 which  revealed over 100 colon polyps that were spread throughout the colon and  were most concentrated in the cecum and ascending colon.  Several polyps  were removed by hot biopsy and were tubular adenomas.  I estimated all the  polyps to be under 8 mm in size.  Genetic testing confirmed familial  adenomatous polyposis syndrome, and despite several conversations with the  patient by me and his primary physician, Dr. Laurita Harper, he did not  return for followup after February 2002 and did not proceed with surgical  referral for total colectomy.  He was recently seen by Dr. Hetty Harper and was  referred back to me for reevaluation.  He has no colorectal complaints and  specifically denies any change in bowel habits, change in stool caliber,  melena, hematochezia, abdominal pain, rectal pain, or weight loss.  No other  family members with colon cancer besides his mother.  I talked with him and  his wife about why he did not proceed with our recommendations previously,  and he states that since he felt well, he really did not believe that he  needed surgery.  Furthermore, his mother had substantial complications  following colon surgery and had a colostomy and problems with an abscess,  and he appears to be very concerned about the risks of surgery.   PAST MEDICAL HISTORY:  1. Diabetes mellitus  2. Status post right  knee arthroscopy  3. Wisdom teeth extraction  4. Familial adenomatous polyposis syndrome  5. Hyperlipidemia  6. Obesity   CURRENT MEDICATIONS:  1. Avandia 8 mg daily.  2. Amaryl 2 mg daily p.r.n. elevated blood sugar.  3. Crestor 10 mg nightly.   MEDICATION ALLERGIES:  Intolerance to large-dose NSAIDS.   SOCIAL HISTORY:  He is married with no children.  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 He denies tobacco and alcohol product  usage.   REVIEW OF SYSTEMS:  As per the handwritten form.   PHYSICAL EXAMINATION:  GENERAL:  Obese white male in no acute distress.  VITAL SIGNS: Height 5 feet 11 inches, weight 306 pounds.  Blood pressure  120/78, pulse 64 and regular.  HEENT: Anicteric sclerae.  Oropharynx clear.  CHEST: Clear to auscultation bilaterally.  CARDIAC: Regular rate and rhythm without murmurs.  ABDOMEN:  Soft and nontender with normoactive bowel sounds.  No palpable  organomegaly, masses, or hernias.  RECTAL:  Examination deferred.  NEUROLOGIC: Alert and oriented x3.  Grossly nonfocal.  ASSESSMENT AND PLAN:  Familial adenomatous polyposis syndrome.  I had a  discussion with him and his wife for over 30 minutes explaining the natural  history of familial adenomatous polyposis syndrome and the potential life-  saving benefits of a proctocolectomy.  After the discussion, he agrees to  proceeding with surgical consultation at [facility] at  [facility].  We will arrange a surgical consultation in the  next few weeks. I recommended repeat colonoscopy and he would like to wait  on this until he see a surgeon to see if it is necessary.     Cory Harper. Russella Dar, MD, [facility]  Electronically Signed    MTS/MedQ  DD: 07/01/2006  DT: 07/01/2006  Job #: [id number]   cc:   Cory Silence, MD

## 2010-12-22 NOTE — Letter (Signed)
April 12, 2006     Venita Lick. Russella Dar, MD, FACG  [address]  [place], [place] [address]   RE:  Cory Harper, Cory Harper  MRN:  [id number]  /  DOB:  10-09-1955   Dear Judie Petit,   I am sending back Aarish Rockers, a 55 year old obese white male who you  scoped in 2002.  He had multiple polyps, probably familial polyposis  syndrome.  He really is against having surgery, but I thought it would be  best to talk with him again and at least have a colonoscopy redone to  recheck, which he, I think, is willing to do.  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   He has diabetes, which is improved at this point.  Obesity is at least  stable.  His cholesterol is under good control.   Medicines include Avandia 8 mg a day, Flonase nasal as needed, Amaryl 2 mg a  day, and Crestor 10 at night.   Thanks for seeing him.  I look forward to your evaluation.   Sincerely,      Arta Silence, MD   RNS/MedQ  DD:  04/12/2006  DT:  04/12/2006  Job #:  [id number]

## 2010-12-26 ENCOUNTER — Encounter: Payer: Self-pay | Admitting: Family Medicine

## 2010-12-28 ENCOUNTER — Ambulatory Visit (INDEPENDENT_AMBULATORY_CARE_PROVIDER_SITE_OTHER): Payer: BC Managed Care – PPO | Admitting: Family Medicine

## 2010-12-28 ENCOUNTER — Encounter: Payer: Self-pay | Admitting: Family Medicine

## 2010-12-28 DIAGNOSIS — E119 Type 2 diabetes mellitus without complications: Secondary | ICD-10-CM

## 2010-12-28 DIAGNOSIS — E669 Obesity, unspecified: Secondary | ICD-10-CM

## 2010-12-28 DIAGNOSIS — I10 Essential (primary) hypertension: Secondary | ICD-10-CM

## 2010-12-28 LAB — HEMOGLOBIN A1C: Hgb A1c MFr Bld: 8.4 % — ABNORMAL HIGH (ref 4.6–6.5)

## 2010-12-28 NOTE — Assessment & Plan Note (Signed)
Adequate. BP Readings from Last 3 Encounters:  12/28/10 128/80  09/27/10 120/72  06/28/10 118/74

## 2010-12-28 NOTE — Patient Instructions (Signed)
Will put today's A1C on form when returns. Will leaver form up front next Wed to be picked up.

## 2010-12-28 NOTE — Progress Notes (Signed)
  Subjective:    Patient ID: Cory Harper, male    DOB: 04-Sep-1955, 55 y.o.   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 He had his PE here in Feb and A1C had increased some from 7.2 in 111 to 8.1 in Feb 12. He has been watching things carefully. He has been exercising more. He has lost 2 pounds since Feb. Her has never had a sugar low experience. He feels well and has complaints.  He closed a truck door on this right index finger a week or so ago.     Review of Systems     Objective:   Physical Exam  Constitutional: He is oriented to person, place, and time. He appears well-developed and well-nourished. No distress.  HENT:  Head: Normocephalic and atraumatic.  Right Ear: External ear normal.  Left Ear: External ear normal.  Nose: Nose normal.  Mouth/Throat: Oropharynx is clear and moist.  Eyes: Conjunctivae and EOM are normal. Pupils are equal, round, and reactive to light. Right eye exhibits no discharge. Left eye exhibits no discharge. No scleral icterus.  Neck: Normal range of motion. Neck supple. No thyromegaly present.  Cardiovascular: Normal rate, regular rhythm, normal heart sounds and intact distal pulses.   No murmur heard. Pulmonary/Chest: Effort normal and breath sounds normal. No respiratory distress. He has no wheezes.  Abdominal: Soft. Bowel sounds are normal. He exhibits no distension and no mass. There is no tenderness. There is no rebound and no guarding.  Genitourinary: Rectum normal.  Musculoskeletal: Normal range of motion. He exhibits no edema.  Lymphadenopathy:    He has no cervical adenopathy.  Neurological: He is alert and oriented to person, place, and time. Coordination normal.  Skin: Skin is warm and dry. No rash noted. He is not diaphoretic.  Psychiatric: He has a normal mood and affect. His behavior is normal. Judgment and thought content normal.          Assessment & Plan:  DOT PE

## 2010-12-28 NOTE — Assessment & Plan Note (Signed)
Encouraged to cont weight loss and increased exercise regimen.

## 2010-12-28 NOTE — Assessment & Plan Note (Addendum)
Doing better. Needs A1C today to check control. Form to be signed. Has lost 2 pounds. Encouraged to continue the trend.

## 2011-03-14 ENCOUNTER — Other Ambulatory Visit: Payer: Self-pay | Admitting: Family Medicine

## 2011-03-16 ENCOUNTER — Other Ambulatory Visit: Payer: BC Managed Care – PPO

## 2011-03-21 ENCOUNTER — Ambulatory Visit: Payer: BC Managed Care – PPO | Admitting: Family Medicine

## 2011-04-26 ENCOUNTER — Other Ambulatory Visit: Payer: BC Managed Care – PPO

## 2011-05-03 ENCOUNTER — Ambulatory Visit: Payer: BC Managed Care – PPO | Admitting: Family Medicine

## 2011-05-29 ENCOUNTER — Other Ambulatory Visit: Payer: Self-pay | Admitting: Family Medicine

## 2011-05-29 DIAGNOSIS — E119 Type 2 diabetes mellitus without complications: Secondary | ICD-10-CM

## 2011-05-29 DIAGNOSIS — E559 Vitamin D deficiency, unspecified: Secondary | ICD-10-CM

## 2011-05-29 DIAGNOSIS — E538 Deficiency of other specified B group vitamins: Secondary | ICD-10-CM

## 2011-06-01 ENCOUNTER — Other Ambulatory Visit (INDEPENDENT_AMBULATORY_CARE_PROVIDER_SITE_OTHER): Payer: BC Managed Care – PPO

## 2011-06-01 DIAGNOSIS — E119 Type 2 diabetes mellitus without complications: Secondary | ICD-10-CM

## 2011-06-01 DIAGNOSIS — E538 Deficiency of other specified B group vitamins: Secondary | ICD-10-CM

## 2011-06-01 DIAGNOSIS — E559 Vitamin D deficiency, unspecified: Secondary | ICD-10-CM

## 2011-06-06 ENCOUNTER — Ambulatory Visit: Payer: BC Managed Care – PPO | Admitting: Family Medicine

## 2011-06-06 ENCOUNTER — Ambulatory Visit (INDEPENDENT_AMBULATORY_CARE_PROVIDER_SITE_OTHER): Payer: BC Managed Care – PPO | Admitting: Family Medicine

## 2011-06-06 ENCOUNTER — Encounter: Payer: Self-pay | Admitting: Family Medicine

## 2011-06-06 VITALS — BP 138/88 | HR 68 | Temp 98.1°F | Wt 288.5 lb

## 2011-06-06 DIAGNOSIS — E119 Type 2 diabetes mellitus without complications: Secondary | ICD-10-CM

## 2011-06-06 MED ORDER — METFORMIN HCL 1000 MG PO TABS: ORAL_TABLET | ORAL | Status: DC

## 2011-06-06 MED ORDER — GLIMEPIRIDE 2 MG PO TABS: 2.0000 mg | ORAL_TABLET | Freq: Two times a day (BID) | ORAL | Status: DC

## 2011-06-06 MED ORDER — LISINOPRIL 5 MG PO TABS: 5.0000 mg | ORAL_TABLET | Freq: Every day | ORAL | Status: DC

## 2011-06-06 NOTE — Patient Instructions (Addendum)
RTC end of Dec for recheck, A1C prior.  Refer to dietician for diet counselling. Do best to lose 30 pounds.

## 2011-06-06 NOTE — Assessment & Plan Note (Signed)
Continues out of control. 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  Must really get serious about exercise and diet. Will refer to Dietician. Bike min of thirty minutes a day, an hour a day, every day would be better.

## 2011-06-06 NOTE — Progress Notes (Signed)
  Subjective:    Patient ID: Cory Harper, male    DOB: [birth year], 55 y.o.   MRN: [id number]  HPIPt here for 6 month followup of DM. He checks his sugar once a day but typically always in the AM. Runs 110-120s. His A1C is in the 8s however. He has started using a stationary bike 10-15 mins a night. He has no trouble with his medications but needs refills.  He has never seen a dietician. 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  HENT: Negative for hearing loss, ear pain, congestion, sore throat, rhinorrhea, neck pain, neck stiffness, postnasal drip, sinus pressure, tinnitus and ear discharge.   Eyes: Negative for pain, discharge and visual disturbance.  Respiratory: Negative for cough, shortness of breath and wheezing.   Cardiovascular: Negative for chest pain and palpitations.       No SOB w/ exertion  Gastrointestinal:       No heartburn or swallowing problems.  Genitourinary:       No nocturia  Skin:       No itching or dryness.  Neurological:       No tingling or balance problems.  All other systems reviewed and are negative.       Objective:   Physical Exam  Constitutional: He appears well-developed and well-nourished. No distress.  HENT:  Head: Normocephalic and atraumatic.  Right Ear: External ear normal.  Left Ear: External ear normal.  Nose: Nose normal.  Mouth/Throat: Oropharynx is clear and moist.  Eyes: Conjunctivae and EOM are normal. Pupils are equal, round, and reactive to light. Right eye exhibits no discharge. Left eye exhibits no discharge.  Neck: Normal range of motion. Neck supple.  Cardiovascular: Normal rate and regular rhythm.   Pulmonary/Chest: Effort normal and breath sounds normal. He has no wheezes.  Lymphadenopathy:    He has no cervical adenopathy.  Skin:  He is not diaphoretic.          Assessment & Plan:

## 2011-06-29 ENCOUNTER — Ambulatory Visit: Payer: Self-pay | Admitting: Family Medicine

## 2011-07-07 ENCOUNTER — Ambulatory Visit: Payer: Self-pay | Admitting: Family Medicine

## 2011-07-23 ENCOUNTER — Other Ambulatory Visit: Payer: Self-pay | Admitting: Family Medicine

## 2011-07-23 DIAGNOSIS — E119 Type 2 diabetes mellitus without complications: Secondary | ICD-10-CM

## 2011-07-27 ENCOUNTER — Other Ambulatory Visit (INDEPENDENT_AMBULATORY_CARE_PROVIDER_SITE_OTHER): Payer: BC Managed Care – PPO

## 2011-07-27 DIAGNOSIS — E119 Type 2 diabetes mellitus without complications: Secondary | ICD-10-CM

## 2011-08-01 ENCOUNTER — Ambulatory Visit (INDEPENDENT_AMBULATORY_CARE_PROVIDER_SITE_OTHER): Payer: BC Managed Care – PPO | Admitting: Family Medicine

## 2011-08-01 ENCOUNTER — Encounter: Payer: Self-pay | Admitting: Family Medicine

## 2011-08-01 VITALS — BP 132/70 | HR 76 | Temp 98.2°F | Wt 281.0 lb

## 2011-08-01 DIAGNOSIS — E119 Type 2 diabetes mellitus without complications: Secondary | ICD-10-CM

## 2011-08-01 DIAGNOSIS — Z23 Encounter for immunization: Secondary | ICD-10-CM

## 2011-08-01 DIAGNOSIS — J069 Acute upper respiratory infection, unspecified: Secondary | ICD-10-CM | POA: Insufficient documentation

## 2011-08-01 DIAGNOSIS — Z86010 Personal history of colonic polyps: Secondary | ICD-10-CM

## 2011-08-01 NOTE — Patient Instructions (Addendum)
RTC with Dr Para March in 3 mos, A1C prior. For congestion, may continue Claritin. Take Guaifenesin (400mg ), take 11/2 tabs by mouth AM and NOON. Get GUAIFENESIN by  going to [facility] or [facility] and getting MUCOUS RELIEF EXPECTORANT/CONGESTION. DO NOT GET MUCINEX (Timed Release Guaifenesin)  Continue hard work of sugar control.  Decrease Metformin to 11/2 in AM and 1 at night. May decrease to 1 twice a day when  sugar is always below 130.

## 2011-08-01 NOTE — Assessment & Plan Note (Signed)
Still needs repeat colonoscopy. "I'll think about it."

## 2011-08-01 NOTE — Progress Notes (Signed)
  Subjective:    Patient ID: Cory Harper, male    DOB: 05-Jan-1956, 55 y.o.   MRN: [id number]  HPI Pt here with wife for diabetes followup. He has lost 7 pounds since being seen 31 Oct.  He has been going to the diabetes dietician and doing well. He has been trying hard with great results. He has had significant congestion for two weeks with no real focal problem. HE has been taking Claritin. He has not had a flu shot.   Review of Systems  Constitutional: Negative for fever, chills, diaphoresis, activity change, appetite change and fatigue.       General congestion.  HENT: Negative for hearing loss, ear pain, congestion, sore throat, rhinorrhea, neck pain, neck stiffness, postnasal drip, sinus pressure, tinnitus and ear discharge.   Eyes: Negative for pain, discharge and visual disturbance.  Respiratory: Positive for cough (mild nonproductive.). Negative for shortness of breath and wheezing.   Cardiovascular: Negative for chest pain and palpitations.       No SOB w/ exertion  Gastrointestinal:       No heartburn or swallowing problems.  Genitourinary:       No nocturia  Skin:       No itching or dryness.  Neurological:       No tingling or balance problems.  All other systems reviewed and are negative.  .     Objective:   Physical Exam  Constitutional: He appears well-developed and well-nourished. No distress.  HENT:  Head: Normocephalic and atraumatic.  Right Ear: External ear normal.  Left Ear: External ear normal.  Nose: Nose normal.  Mouth/Throat: Oropharynx is clear and moist.  Eyes: Conjunctivae and EOM are normal. Pupils are equal, round, and reactive to light. Right eye exhibits no discharge. Left eye exhibits no discharge.  Neck: Normal range of motion. Neck supple.  Cardiovascular: Normal rate and regular rhythm.   Pulmonary/Chest: Effort normal and breath sounds normal. He has no wheezes.  Lymphadenopathy:    He has no cervical adenopathy.  Skin: He is not  diaphoretic.          Assessment & Plan:

## 2011-08-01 NOTE — Assessment & Plan Note (Addendum)
Great improvement. Decrease Metformin per instructions. Cont efforts and don't let down. Lab Results  Component Value Date   HGBA1C 7.4* 07/27/2011  \Down from 8.8. Weight from 288 to today's 281!

## 2011-08-01 NOTE — Assessment & Plan Note (Signed)
See instructions

## 2011-08-07 ENCOUNTER — Ambulatory Visit: Payer: Self-pay | Admitting: Family Medicine

## 2011-08-07 DIAGNOSIS — C801 Malignant (primary) neoplasm, unspecified: Secondary | ICD-10-CM

## 2011-08-07 HISTORY — DX: Malignant (primary) neoplasm, unspecified: C80.1

## 2011-08-17 ENCOUNTER — Other Ambulatory Visit: Payer: Self-pay | Admitting: Internal Medicine

## 2011-08-17 MED ORDER — GLUCOSE BLOOD VI STRP: 1.0000 | ORAL_STRIP | Freq: Two times a day (BID) | Status: DC

## 2011-08-17 NOTE — Telephone Encounter (Signed)
Faxed Rx to pharmacy  

## 2011-09-10 ENCOUNTER — Other Ambulatory Visit: Payer: Self-pay | Admitting: Family Medicine

## 2011-09-11 ENCOUNTER — Other Ambulatory Visit: Payer: Self-pay | Admitting: Family Medicine

## 2011-09-11 MED ORDER — GLUCOSE BLOOD VI STRP: 1.0000 | ORAL_STRIP | Freq: Two times a day (BID) | Status: DC

## 2011-09-11 NOTE — Telephone Encounter (Signed)
Insurance will not refill test strips at [facility] on [address] in [place] on [address] Only has one test strip left.  Please call patient back.

## 2011-09-11 NOTE — Telephone Encounter (Signed)
I spoke with the patient and he says the strips were only filled #25 back in January and he is out of them now and the pharmacy is saying that his insurance will not pay right now.  I explained to him that he will need to contact his insurance to find out why they won't pay and what we can do to remedy the situation.  I re-sent the most recent Rx and told him to contact us if he needed [place] to do something different.

## 2011-09-12 ENCOUNTER — Telehealth: Payer: Self-pay | Admitting: *Deleted

## 2011-09-12 NOTE — Telephone Encounter (Signed)
Received fax from pharmacy requesting a prior authorization on patient's test strips. Called and spoke to [place] at [employer] and was advised that this would have to be handled thru patient's insurance BCBS. Called and spoke to [place] and was advised that there is not anything that Dr. Para March can do to change the patient's insurance plan. The plan only allows the patient to get #51 for a 30 day supply unless he is insulin dependent. Was advised that patient would have to use his medical plan which he would have to go thru a DME supplier such as [facility] to get his supplies and his deductible would apply. Called and explained all of this to the patient. Patient stated that he will contact his insurance company and work this out with them since there is not any type of prior authorization that would change what his insurance company allows.

## 2011-09-12 NOTE — Telephone Encounter (Signed)
Noted  

## 2011-10-23 ENCOUNTER — Other Ambulatory Visit (INDEPENDENT_AMBULATORY_CARE_PROVIDER_SITE_OTHER): Payer: BC Managed Care – PPO

## 2011-10-23 DIAGNOSIS — E119 Type 2 diabetes mellitus without complications: Secondary | ICD-10-CM

## 2011-10-30 ENCOUNTER — Encounter: Payer: Self-pay | Admitting: Family Medicine

## 2011-10-30 ENCOUNTER — Ambulatory Visit (INDEPENDENT_AMBULATORY_CARE_PROVIDER_SITE_OTHER): Payer: BC Managed Care – PPO | Admitting: Family Medicine

## 2011-10-30 VITALS — BP 114/70 | HR 73 | Temp 97.3°F | Wt 271.0 lb

## 2011-10-30 DIAGNOSIS — Z125 Encounter for screening for malignant neoplasm of prostate: Secondary | ICD-10-CM

## 2011-10-30 DIAGNOSIS — E119 Type 2 diabetes mellitus without complications: Secondary | ICD-10-CM

## 2011-10-30 NOTE — Patient Instructions (Signed)
Check your sugar a few times a week or if you are feeling bad.  Keep exercising.   Decrease to 1000 mg of metformin twice a day.   Schedule a physical in the summer.   Take care.

## 2011-10-30 NOTE — Progress Notes (Signed)
Diabetes:  Using medications without difficulties: yes Hypoglycemic episodes: no Hyperglycemic episodes:no Feet problems:no Blood Sugars averaging: ~125, occ lower A1c was 7.2.  Went through DM2 education.  Exercise: stationary bike.  Working on diet.    H/o familial polyposis per patient.  He had declined colectomy and specialty referral.  He agrees with still checking IFOBs.  We can do this later at a physical in 2013.    PMH and SH reviewed  Meds, vitals, and allergies reviewed.   ROS: See HPI.  Otherwise negative.    GEN: nad, alert and oriented HEENT: mucous membranes moist NECK: supple w/o LA CV: rrr. PULM: ctab, no inc wob ABD: soft, +bs EXT: no edema SKIN: no acute rash  Diabetic foot exam: Normal inspection No skin breakdown No calluses  Normal DP pulses Normal sensation to light touch and monofilament Nails normal

## 2011-10-31 ENCOUNTER — Encounter: Payer: Self-pay | Admitting: Family Medicine

## 2011-10-31 NOTE — Assessment & Plan Note (Signed)
A1c acceptable, continue diet and exercise, no change in meds.  Will recheck later in 2013.  He agrees.  Path/phys DM2 d/w pt. >25 min spent with face to face with patient, >50% counseling and/or coordinating care

## 2011-12-10 ENCOUNTER — Other Ambulatory Visit: Payer: Self-pay | Admitting: Family Medicine

## 2011-12-28 ENCOUNTER — Other Ambulatory Visit: Payer: Self-pay | Admitting: Family Medicine

## 2012-01-22 ENCOUNTER — Other Ambulatory Visit (INDEPENDENT_AMBULATORY_CARE_PROVIDER_SITE_OTHER): Payer: BC Managed Care – PPO

## 2012-01-22 DIAGNOSIS — E119 Type 2 diabetes mellitus without complications: Secondary | ICD-10-CM

## 2012-01-22 DIAGNOSIS — Z125 Encounter for screening for malignant neoplasm of prostate: Secondary | ICD-10-CM

## 2012-01-22 LAB — COMPREHENSIVE METABOLIC PANEL
Albumin: 3.4 g/dL — ABNORMAL LOW (ref 3.5–5.2)
Alkaline Phosphatase: 56 U/L (ref 39–117)
BUN: 21 mg/dL (ref 6–23)
Creatinine, Ser: 0.8 mg/dL (ref 0.4–1.5)
Glucose, Bld: 107 mg/dL — ABNORMAL HIGH (ref 70–99)
Total Bilirubin: 0.5 mg/dL (ref 0.3–1.2)

## 2012-01-22 LAB — HEMOGLOBIN A1C: Hgb A1c MFr Bld: 7.8 % — ABNORMAL HIGH (ref 4.6–6.5)

## 2012-01-22 LAB — LIPID PANEL
Cholesterol: 116 mg/dL (ref 0–200)
HDL: 37.2 mg/dL — ABNORMAL LOW (ref >39.00)
Triglycerides: 107 mg/dL (ref 0.0–149.0)
VLDL: 21.4 mg/dL (ref 0.0–40.0)

## 2012-01-22 LAB — PSA: PSA: 0.34 ng/mL (ref 0.10–4.00)

## 2012-01-29 ENCOUNTER — Encounter: Payer: Self-pay | Admitting: Family Medicine

## 2012-01-29 ENCOUNTER — Ambulatory Visit (INDEPENDENT_AMBULATORY_CARE_PROVIDER_SITE_OTHER): Payer: BC Managed Care – PPO | Admitting: Family Medicine

## 2012-01-29 VITALS — BP 120/70 | HR 75 | Temp 97.9°F | Wt 274.0 lb

## 2012-01-29 DIAGNOSIS — Z0000 Encounter for general adult medical examination without abnormal findings: Secondary | ICD-10-CM

## 2012-01-29 DIAGNOSIS — I10 Essential (primary) hypertension: Secondary | ICD-10-CM

## 2012-01-29 DIAGNOSIS — E7800 Pure hypercholesterolemia, unspecified: Secondary | ICD-10-CM

## 2012-01-29 DIAGNOSIS — Z1211 Encounter for screening for malignant neoplasm of colon: Secondary | ICD-10-CM

## 2012-01-29 DIAGNOSIS — E119 Type 2 diabetes mellitus without complications: Secondary | ICD-10-CM

## 2012-01-29 DIAGNOSIS — Z860100 Personal history of colon polyps, unspecified: Secondary | ICD-10-CM

## 2012-01-29 MED ORDER — METFORMIN HCL 1000 MG PO TABS: 1000.0000 mg | ORAL_TABLET | Freq: Two times a day (BID) | ORAL | Status: DC

## 2012-01-29 MED ORDER — GLIMEPIRIDE 2 MG PO TABS: 2.0000 mg | ORAL_TABLET | Freq: Two times a day (BID) | ORAL | Status: DC

## 2012-01-29 MED ORDER — LISINOPRIL 5 MG PO TABS: 5.0000 mg | ORAL_TABLET | Freq: Every day | ORAL | Status: DC

## 2012-01-29 MED ORDER — FLUTICASONE PROPIONATE 50 MCG/ACT NA SUSP: 2.0000 | Freq: Every day | NASAL | Status: DC

## 2012-01-29 NOTE — Patient Instructions (Addendum)
Recheck A1c in 4 months.  Then visit with Para March a few days later. Work on [employer] the meantime.  Go to the lab on the way out.  Take care.

## 2012-01-29 NOTE — Progress Notes (Signed)
CPE- See plan.  Routine anticipatory guidance given to patient.  See health maintenance. Tetanus 2009 Flu done yearly PSA not elevated.  We discussed recent recs.  No FH prostate cancer.  We can consider checking next year.   Colon cancer screening discussed.  FH polyposis.  He had declined specialty eval prev.  "I want to think about it."  We talked about risk of not having eval. He agreed with checking IFOB today.   Advance directive discussed, he'll discuss with wife.  He doesn't have formal papers.   Labs d/w pt.   Hypertension:    Using medication without problems or lightheadedness: yes Chest pain with exertion:no Edema:no Short of breath:no  Elevated Cholesterol: Using medications without problems:yes Muscle aches: no Diet compliance: yes, but up 3 lbs Exercise: a day on the bike  Diabetes:  Using medications without difficulties:yes Hypoglycemic episodes:no Hyperglycemic episodes:no Feet problems:no Blood Sugars averaging: ~110 in AM, improved over last few weeks.  eye exam within last year: yes, 2 weeks ago per patient He went to nutrition prev.  A1c 7.8.  D/w pt.  He's cutting back on carbs.    PMH and SH reviewed  Meds, vitals, and allergies reviewed.   ROS: See HPI.  Otherwise negative.    GEN: nad, alert and oriented HEENT: mucous membranes moist NECK: supple w/o LA CV: rrr. PULM: ctab, no inc wob ABD: soft, +bs EXT: no edema SKIN: no acute rash DRE deferred.

## 2012-01-30 DIAGNOSIS — Z0000 Encounter for general adult medical examination without abnormal findings: Secondary | ICD-10-CM | POA: Insufficient documentation

## 2012-01-30 DIAGNOSIS — E7800 Pure hypercholesterolemia, unspecified: Secondary | ICD-10-CM | POA: Insufficient documentation

## 2012-01-30 NOTE — Assessment & Plan Note (Signed)
Continue current meds and he'll work on weight loss.

## 2012-01-30 NOTE — Assessment & Plan Note (Signed)
Continue current meds, he needs to lose weight, exercise and diet.  He understands.  Recheck later in 2013.

## 2012-01-30 NOTE — Assessment & Plan Note (Signed)
Controlled, continue current meds.   

## 2012-01-30 NOTE — Assessment & Plan Note (Signed)
Encouraged GI eval.  He'll consider.

## 2012-02-01 ENCOUNTER — Ambulatory Visit: Payer: BC Managed Care – PPO

## 2012-02-01 DIAGNOSIS — Z1211 Encounter for screening for malignant neoplasm of colon: Secondary | ICD-10-CM

## 2012-02-06 ENCOUNTER — Telehealth: Payer: Self-pay | Admitting: *Deleted

## 2012-02-06 ENCOUNTER — Telehealth: Payer: Self-pay

## 2012-02-06 NOTE — Telephone Encounter (Signed)
Cory Harper with [facility] called and states that patient is scheduled for an appointment with Dr Russella Dar. However, he would like to switch to Dr Christella Hartigan since his wife sees Dr Christella Hartigan. Dr Russella Dar are you okay with patient switching?

## 2012-02-06 NOTE — Telephone Encounter (Signed)
Notified pt on 02/05/12 IFOB positive and transferred to [facility] to schedule GI appt. Pt calls this AM; pt did not schedule GI appt. Pt saw Dr Russella Dar 12 years ago diagnosed with multiple polyps. Saw Dr Russella Dar approx 5 years ago and was advised to go to [facility] or [facility] for surgery to have part of colon removed and reconnected to small intestine. Pt did not have done. Pt does not care to see Dr Russella Dar again. Pt wants to talk with Dr Para March if should see local doctor and have colonoscopy (last colonoscopy 12 years ago) or go to [facility] instead. Pts mother had colostomy with multiple problems.Please advise.

## 2012-02-06 NOTE — Telephone Encounter (Signed)
Patient notified as instructed by telephone. Was advised by patient that he would like a referral to a [facility] GI doctor. Advised patient that the referral coordinator will be in touch with him to get this scheduled.

## 2012-02-06 NOTE — Telephone Encounter (Signed)
I don't have a preference where he goes, but I would advise him to seek follow up.  I would at minimum have him see GI locally to see what they offer.  If they rec that he sees [facility] or [facility], I will defer to them.

## 2012-02-08 ENCOUNTER — Telehealth: Payer: Self-pay | Admitting: Gastroenterology

## 2012-02-08 NOTE — Telephone Encounter (Signed)
Please advise if you approve of the change to Dr. Christella Hartigan

## 2012-02-10 NOTE — Telephone Encounter (Signed)
OK with me. Sheri sent me another note with same request.

## 2012-02-10 NOTE — Telephone Encounter (Signed)
OK with me.

## 2012-02-11 NOTE — Telephone Encounter (Signed)
Left message for patient to call back  

## 2012-02-11 NOTE — Telephone Encounter (Signed)
Dr Christella Hartigan, Are you okay with patient switch to you?

## 2012-02-11 NOTE — Telephone Encounter (Signed)
Yes, thanks

## 2012-02-11 NOTE — Telephone Encounter (Signed)
Patient is scheduled for 03/11/12 1:30 with Dr. Christella Hartigan for heme positive stool

## 2012-02-11 NOTE — Telephone Encounter (Signed)
See other telephone note from 02/06/12

## 2012-03-11 ENCOUNTER — Encounter: Payer: Self-pay | Admitting: Gastroenterology

## 2012-03-11 ENCOUNTER — Ambulatory Visit (INDEPENDENT_AMBULATORY_CARE_PROVIDER_SITE_OTHER): Payer: BC Managed Care – PPO | Admitting: Gastroenterology

## 2012-03-11 VITALS — BP 148/60 | HR 84 | Ht 68.5 in | Wt 272.4 lb

## 2012-03-11 DIAGNOSIS — Z86010 Personal history of colonic polyps: Secondary | ICD-10-CM

## 2012-03-11 DIAGNOSIS — D126 Benign neoplasm of colon, unspecified: Secondary | ICD-10-CM

## 2012-03-11 DIAGNOSIS — R195 Other fecal abnormalities: Secondary | ICD-10-CM

## 2012-03-11 MED ORDER — MOVIPREP 100 G PO SOLR: 1.0000 | ORAL | Status: DC

## 2012-03-11 NOTE — Patient Instructions (Addendum)
You will be set up for a colonoscopy for high risk of colon polyps, colon cancer (LEC, moderate sedation).

## 2012-03-11 NOTE — Progress Notes (Signed)
HPI: This is a  pleasant 56 year old man who previously underwent colonoscopy with one of my partners Dr. Russella Dar in 2002. He was found to have around 100 polyps throughout his colon. 5 of these were removed to prove pathology which were adenomatous. This was when he was at the age of 89. His mother had colon cancer. 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 He was suggested in 2002 to meet with a surgeon to discuss total proctocolectomy. The note states that my partner spoke with him for 35-40 minutes. He was told to followup in 2 months. Unit of following up 5 years later with the same partner and he was again recommended to have a surgical consultation at the [place] [facility]. He agreed to go ahead with that surgical count consultation however he never went for it. My partner recommended a repeat colonoscopy as well however he declined that.   He had FOBT positive stool found last month, interested in having a colonoscopy.  He has had no overt GI bleeding, he has had no changes in his bowels.    Review of systems: Pertinent positive and negative review of systems were noted in the above HPI section. Complete review of systems was performed and was otherwise normal.    Past Medical History  Diagnosis Date  . Diabetes mellitus   . Colon polyps     he had declined f/u colonoscopy after inital colonoscopy  . Hypertension     Past Surgical History  Procedure Date  . Knee arthroscopy 1998    Right knee    Current Outpatient Prescriptions  Medication Sig Dispense Refill  . cholecalciferol (VITAMIN D) 1000 UNITS tablet Take 1,000 Units by mouth daily.      . clotrimazole-betamethasone (LOTRISONE) cream Apply topically 2 (two) times daily.        . CRESTOR 10 MG tablet take 1 tablet by mouth at bedtime  30 tablet  11  .  fluticasone (FLONASE) 50 MCG/ACT nasal spray Place 2 sprays into the nose daily.  16 g  12  . glimepiride (AMARYL) 2 MG tablet Take 1 tablet (2 mg total) by mouth 2 (two) times daily.  60 tablet  12  . glucose blood (ACCU-CHEK AVIVA PLUS) test strip 1 each by Other route 2 (two) times daily. Use as instructed  100 each  9  . lisinopril (PRINIVIL,ZESTRIL) 5 MG tablet Take 1 tablet (5 mg total) by mouth daily.  30 tablet  12  . metFORMIN (GLUCOPHAGE) 1000 MG tablet Take 1 tablet (1,000 mg total) by mouth 2 (two) times daily with a meal.  60 tablet  12  . saxagliptin HCl (ONGLYZA) 5 MG TABS tablet Take 5 mg by mouth daily.       . vitamin B-12 (CYANOCOBALAMIN) 1000 MCG tablet Take 1,000 mcg by mouth daily.        Allergies as of 03/11/2012 - Review Complete 03/11/2012  Allergen Reaction Noted  . Ibuprofen  05/22/2006    Family History  Problem Relation Age of Onset  . Uterine cancer Mother   . Hypertension Mother   . Stroke Mother   . Colon cancer Mother   . Diabetes Father   . Obesity Father   . Diabetes Paternal Grandmother   . Prostate cancer Neg Hx   . Colon polyps Mother  History   Social History  . Marital Status: Married    Spouse Name: N/A    Number of Children: 0  . Years of Education: N/A   Occupational History  . 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 Smoking status: Former Games developer  . Smokeless tobacco: Never Used   Comment: quit in 1993  . Alcohol Use: No  . Drug Use: No  . Sexually Active: Not on file   Other Topics Concern  . 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Married [phone] kids       Physical Exam: BP 148/60  Pulse 84  Ht 5' 8.5" (1.74 m)  Wt 272 lb 6 oz (123.548 kg)  BMI 40.81 kg/m2 Constitutional: generally well-appearing Psychiatric: alert and oriented x3 Eyes:  extraocular movements intact Mouth: oral pharynx moist, no lesions Neck: supple no lymphadenopathy Cardiovascular: heart regular rate and rhythm Lungs: clear to auscultation bilaterally Abdomen: soft, nontender, nondistended, no obvious ascites, no peritoneal signs, normal bowel sounds Extremities: no lower extremity edema bilaterally Skin: no lesions on visible extremities    Assessment and plan: 56 y.o. male at very high risk for colon cancer given his well-documented FAP  He was previously recommended to meet with a surgeon and was referred for surgical consultation however he never went for those appointments. It sounds as if he is extremely afraid of eating the remote possibility of having a colostomy or ileostomy bag. I again told him that abnormal certain referring him to a surgeon for consultation to consider total colectomy. I also recommend a repeat colonoscopy to restage his polyposis syndrome. Perhaps if he were to be proven that these polyps were still present, perhaps growing, perhaps transformed into cancer already he would actually follow our advice on seeing a surgeon.

## 2012-03-25 ENCOUNTER — Other Ambulatory Visit: Payer: BC Managed Care – PPO

## 2012-03-25 ENCOUNTER — Telehealth: Payer: Self-pay

## 2012-03-25 ENCOUNTER — Ambulatory Visit (AMBULATORY_SURGERY_CENTER): Payer: BC Managed Care – PPO | Admitting: Gastroenterology

## 2012-03-25 ENCOUNTER — Encounter: Payer: Self-pay | Admitting: Gastroenterology

## 2012-03-25 ENCOUNTER — Other Ambulatory Visit (INDEPENDENT_AMBULATORY_CARE_PROVIDER_SITE_OTHER): Payer: BC Managed Care – PPO

## 2012-03-25 VITALS — BP 137/76 | HR 95 | Temp 97.0°F | Resp 21 | Ht 68.0 in | Wt 272.0 lb

## 2012-03-25 DIAGNOSIS — D126 Benign neoplasm of colon, unspecified: Secondary | ICD-10-CM

## 2012-03-25 DIAGNOSIS — D371 Neoplasm of uncertain behavior of stomach: Secondary | ICD-10-CM

## 2012-03-25 DIAGNOSIS — K6389 Other specified diseases of intestine: Secondary | ICD-10-CM

## 2012-03-25 DIAGNOSIS — D378 Neoplasm of uncertain behavior of other specified digestive organs: Secondary | ICD-10-CM

## 2012-03-25 DIAGNOSIS — D375 Neoplasm of uncertain behavior of rectum: Secondary | ICD-10-CM

## 2012-03-25 DIAGNOSIS — R195 Other fecal abnormalities: Secondary | ICD-10-CM

## 2012-03-25 DIAGNOSIS — R933 Abnormal findings on diagnostic imaging of other parts of digestive tract: Secondary | ICD-10-CM

## 2012-03-25 DIAGNOSIS — C189 Malignant neoplasm of colon, unspecified: Secondary | ICD-10-CM

## 2012-03-25 LAB — CREATININE, SERUM: Creatinine, Ser: 0.7 mg/dL (ref 0.4–1.5)

## 2012-03-25 LAB — BUN: BUN: 10 mg/dL (ref 6–23)

## 2012-03-25 MED ORDER — SODIUM CHLORIDE 0.9 % IV SOLN: 500.0000 mL | INTRAVENOUS | Status: DC

## 2012-03-25 NOTE — Telephone Encounter (Signed)
  You have been scheduled for a CT scan of the abdomen and pelvis at [facility] ([address]---this is in the same building as [facility]).   You are scheduled on 03/28/12 at 130 pm . You should arrive 15 minutes prior to your appointment time for registration. Please follow the written instructions below on the day of your exam:  WARNING: IF YOU ARE ALLERGIC TO IODINE/X-RAY DYE, PLEASE NOTIFY RADIOLOGY IMMEDIATELY AT [phone]! YOU WILL BE GIVEN A 13 HOUR PREMEDICATION PREP.  1) Do not eat or drink anything after 930 am (4 hours prior to your test) 2) You have been given 2 bottles of oral contrast to drink. The solution may taste better if refrigerated, but do NOT add ice or any other liquid to this solution. Shake well before drinking.    Drink 1 bottle of contrast @ 1130 am (2 hours prior to your exam)  Drink 1 bottle of contrast @ 1230 pm (1 hour prior to your exam)  You may take any medications as prescribed with a small amount of water except for the following: Metformin, Glucophage, Glucovance, Avandamet, Riomet, Fortamet, Actoplus Met, Janumet, Glumetza or Metaglip. The above medications must be held the day of the exam AND 48 hours after the exam.  The purpose of you drinking the oral contrast is to aid in the visualization of your intestinal tract. The contrast solution may cause some diarrhea. Before your exam is started, you will be given a small amount of fluid to drink. Depending on your individual set of symptoms, you may also receive an intravenous injection of x-ray contrast/dye. Plan on being at [facility] for 30 minutes or long, depending on the type of exam you are having performed.  If you have any questions regarding your exam or if you need to reschedule, you may call the CT department at [phone] between the hours of 8:00 am and 5:00 pm, Monday-Friday.  ________________________________________________________________________

## 2012-03-25 NOTE — Telephone Encounter (Signed)
Pre visit letter mailed and EGD scheduled, referral to [facility] made and CT chest abd pelvis to be scheduled, message left with [facility] CT.  [name] in EPIC.

## 2012-03-25 NOTE — Patient Instructions (Addendum)

## 2012-03-25 NOTE — Progress Notes (Signed)
Patient did not experience any of the following events: a burn prior to discharge; a fall within the facility; wrong site/side/patient/procedure/implant event; or a hospital transfer or hospital admission upon discharge from the facility. (G8907) Patient did not have preoperative order for IV antibiotic SSI prophylaxis. (G8918)  

## 2012-03-25 NOTE — Op Note (Signed)
[facility] [address] [place], [address]   COLONOSCOPY PROCEDURE REPORT  PATIENT: Cory, Harper  MR#: [id number] BIRTHDATE: [birth year] , 55  yrs. old GENDER: Male ENDOSCOPIST: Rachael Fee, MD REFERRED XB:JYNWGN Duncan, M.D. 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  Now FOBT + stool.  MEDICATIONS: Fentanyl 75 mcg IV, Versed 8 mg IV, and These medications were titrated to patient response per physician's verbal order  DESCRIPTION OF PROCEDURE:   After the risks benefits and alternatives of the procedure were thoroughly explained, informed consent was obtained.  A digital rectal exam revealed no abnormalities of the rectum.   The LB [id number]  endoscope was introduced through the anus and advanced to the cecum, which was identified by both the appendix and ileocecal valve. No adverse events experienced.   The quality of the prep was good.  The instrument was then slowly withdrawn as the colon was fully examined.   COLON FINDINGS: There were at least 200 polyps throughout colon (spread evenly from cecum to most distal aspect of his rectum). The polyps were predominantly sessile, measuring 4-10mm across. There were some that were pedunculated and some that measured 2-3cm across.  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 This measured 4cm across, was firm and ulcerated.  Biopsies taken from the maligant appearing mass and sent to pathology. Retroflexed views revealed no abnormalities.  The time to cecum = 2.9 minutes  Withdrawal time= 6.5 minutes.  The scope was withdrawn and the procedure completed. COMPLICATIONS: There were no complications.  ENDOSCOPIC IMPRESSION: There were at least 200 polyps throughout  colon. 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  Biopsies taken from the malignant appearing mass  RECOMMENDATIONS: My office will set up staging workup including CT scan chest, abdomen, pelvis with IV and PO contrast.  Bloodwork with [name].  My office will set up referral to general surgeon to consider elective total proctocolectomy (as was recommended several years ago). My office will set up outpatient EGD to survey your UGI tract (FAP puts you at risk fro UGI cancers as well)  eSigned:  Rachael Fee, MD 03/25/2012 2:11 PM r

## 2012-03-26 ENCOUNTER — Telehealth: Payer: Self-pay | Admitting: *Deleted

## 2012-03-26 ENCOUNTER — Telehealth: Payer: Self-pay

## 2012-03-26 NOTE — Telephone Encounter (Signed)
Message copied by Donata Duff on Wed Mar 26, 2012  8:35 AM ------      Message from: Marnette Burgess      Created: Wed Mar 26, 2012  8:24 AM      Regarding: Referral       Patient is scheduled to see Dr. Abigail Miyamoto on 04/14/12 @ 11:30am, arrive @ 11:00am.  If you have any questions please call [phone].            Thank You,      Elane Fritz      ----- Message -----         From: Donata Duff, CMA         Sent: 03/25/2012   2:51 PM           To: Marnette Burgess            My office will set up staging workup including CT scan chest,      abdomen, pelvis with IV and PO contrast.  Bloodwork with [name].  My office will set up referral to general surgeon to consider elective      total proctocolectomy (as was recommended several years ago).      My office will set up outpatient EGD to survey your UGI tract (FAP      puts you at risk fro UGI cancers as well)                  Thanks for your help

## 2012-03-26 NOTE — Telephone Encounter (Signed)
Pt has been given instructions for [facility], CT, and previsit and EGD.  Pt had labs yesterday.  He will call back with any further questions

## 2012-03-26 NOTE — Telephone Encounter (Signed)
  Patient is scheduled to see Dr. Abigail Miyamoto on 04/14/12 @ 11:30am, arrive @ 11:00am. If you have any questions please call [phone].   Left message on machine to call back

## 2012-03-26 NOTE — Telephone Encounter (Signed)
  Follow up Call-  Call back number 03/25/2012  Post procedure Call Back phone  # [phone]  Permission to leave phone message Yes     Patient questions:  Do you have a fever, pain , or abdominal swelling? no Pain Score  0 *  Have you tolerated food without any problems? yes  Have you been able to return to your normal activities? yes  Do you have any questions about your discharge instructions: Diet   no Medications  no Follow up visit  no  Do you have questions or concerns about your Care? no  Actions: * If pain score is 4 or above: No action needed, pain <4.

## 2012-03-26 NOTE — Telephone Encounter (Signed)
See alternate phone note  

## 2012-03-28 ENCOUNTER — Ambulatory Visit (INDEPENDENT_AMBULATORY_CARE_PROVIDER_SITE_OTHER)
Admission: RE | Admit: 2012-03-28 | Discharge: 2012-03-28 | Disposition: A | Discharge status: Home / Self Care | Payer: BC Managed Care – PPO | Source: Ambulatory Visit | Attending: Gastroenterology | Admitting: Gastroenterology

## 2012-03-28 ENCOUNTER — Other Ambulatory Visit: Payer: BC Managed Care – PPO

## 2012-03-28 DIAGNOSIS — K6389 Other specified diseases of intestine: Secondary | ICD-10-CM

## 2012-03-28 MED ORDER — IOHEXOL 300 MG/ML  SOLN
100.0000 mL | Freq: Once | INTRAMUSCULAR | Status: AC | PRN
  Administered 2012-03-28: 100 mL via INTRAVENOUS

## 2012-03-31 ENCOUNTER — Other Ambulatory Visit: Payer: Self-pay

## 2012-03-31 DIAGNOSIS — C189 Malignant neoplasm of colon, unspecified: Secondary | ICD-10-CM

## 2012-03-31 DIAGNOSIS — E041 Nontoxic single thyroid nodule: Secondary | ICD-10-CM

## 2012-03-31 NOTE — Progress Notes (Signed)
Pt aware.

## 2012-04-04 ENCOUNTER — Ambulatory Visit (HOSPITAL_COMMUNITY)
Admission: RE | Admit: 2012-04-04 | Discharge: 2012-04-04 | Disposition: A | Discharge status: Home / Self Care | Payer: BC Managed Care – PPO | Source: Ambulatory Visit | Attending: Gastroenterology | Admitting: Gastroenterology

## 2012-04-04 ENCOUNTER — Other Ambulatory Visit: Payer: BC Managed Care – PPO

## 2012-04-04 DIAGNOSIS — E041 Nontoxic single thyroid nodule: Secondary | ICD-10-CM

## 2012-04-04 DIAGNOSIS — C189 Malignant neoplasm of colon, unspecified: Secondary | ICD-10-CM

## 2012-04-08 ENCOUNTER — Encounter: Payer: BC Managed Care – PPO | Admitting: Gastroenterology

## 2012-04-08 ENCOUNTER — Other Ambulatory Visit: Payer: Self-pay

## 2012-04-08 DIAGNOSIS — E041 Nontoxic single thyroid nodule: Secondary | ICD-10-CM

## 2012-04-08 NOTE — Progress Notes (Signed)
[facility] will call pt with appt I have notified to have labs

## 2012-04-09 ENCOUNTER — Other Ambulatory Visit (INDEPENDENT_AMBULATORY_CARE_PROVIDER_SITE_OTHER): Payer: BC Managed Care – PPO

## 2012-04-09 DIAGNOSIS — E041 Nontoxic single thyroid nodule: Secondary | ICD-10-CM

## 2012-04-09 LAB — TSH: TSH: 2.86 u[IU]/mL (ref 0.35–5.50)

## 2012-04-10 ENCOUNTER — Telehealth: Payer: Self-pay | Admitting: Gastroenterology

## 2012-04-10 ENCOUNTER — Ambulatory Visit
Admission: RE | Admit: 2012-04-10 | Discharge: 2012-04-10 | Disposition: A | Discharge status: Home / Self Care | Payer: BC Managed Care – PPO | Source: Ambulatory Visit | Attending: Gastroenterology | Admitting: Gastroenterology

## 2012-04-10 ENCOUNTER — Other Ambulatory Visit (HOSPITAL_COMMUNITY)
Admission: RE | Admit: 2012-04-10 | Discharge: 2012-04-10 | Disposition: A | Discharge status: Home / Self Care | Payer: BC Managed Care – PPO | Source: Ambulatory Visit | Attending: Interventional Radiology | Admitting: Interventional Radiology

## 2012-04-10 DIAGNOSIS — E049 Nontoxic goiter, unspecified: Secondary | ICD-10-CM | POA: Insufficient documentation

## 2012-04-10 DIAGNOSIS — E041 Nontoxic single thyroid nodule: Secondary | ICD-10-CM

## 2012-04-10 NOTE — Telephone Encounter (Signed)
Pt was given the phone number to [facility] radiology, he needs to schedule his biopsy for another time.

## 2012-04-10 NOTE — Telephone Encounter (Signed)
Left message on machine to call back  

## 2012-04-14 ENCOUNTER — Encounter (INDEPENDENT_AMBULATORY_CARE_PROVIDER_SITE_OTHER): Payer: Self-pay | Admitting: Surgery

## 2012-04-14 ENCOUNTER — Ambulatory Visit (INDEPENDENT_AMBULATORY_CARE_PROVIDER_SITE_OTHER): Payer: BC Managed Care – PPO | Admitting: Surgery

## 2012-04-14 VITALS — BP 134/78 | HR 70 | Temp 97.6°F | Resp 16 | Ht 71.0 in | Wt 274.2 lb

## 2012-04-14 DIAGNOSIS — D126 Benign neoplasm of colon, unspecified: Secondary | ICD-10-CM

## 2012-04-14 DIAGNOSIS — D497 Neoplasm of unspecified behavior of endocrine glands and other parts of nervous system: Secondary | ICD-10-CM

## 2012-04-14 DIAGNOSIS — D34 Benign neoplasm of thyroid gland: Secondary | ICD-10-CM | POA: Insufficient documentation

## 2012-04-14 NOTE — Progress Notes (Signed)
Subjective:     Patient ID: Cory Harper, male   DOB: 03/05/1956, 56 y.o.   MRN: [id number]  HPI  Cory Harper  08-08-1955 [id number]  Patient Care Team: [name], MD as PCP - General (Family Medicine) Rachael Fee, MD as Consulting Physician (Gastroenterology) [name], MD as Consulting Physician (General Surgery)  This patient is a 56 y.o.male who presents today for surgical evaluation at the request of Dr. Christella Hartigan.   Reason for evaluation: Familial adenomatous polyposis.  Very suspicious cecal lesion for cancer.  Re\re request for consideration of surgery.  Patient is a pleasant obese bus driver.  Found to have 100s of polyps on a colonoscopy over a decade ago.  Was recommended by Dr. Russella Dar with the [facility] to consider surgery.  He held off.  Was recommended to see a surgeon again in a few years later.  He held off.  Switched gastroenterologists since Dr. Christella Hartigan took care of his wife.  Colonoscope was done.  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  CT scan very suspicious for cancer.  He was sent to our group for surgical evaluation.  Due to insurance issues, out new colorectal surgeon, Dr. Maisie Fus, was not immediately available.  I fit him into clinic today.  He comes today with his wife and sister.  He wished to have them involved in discussion. His sister had a colonoscopy a few years ago that was completely clear.  Due for colonoscopy five year followup in 2014.  There are mother had an issue with emergency surgery and colostomy.  Struggled with an abscess and a lot of ostomy problems.  I think this is much of the reason why he has been hesitant to consider surgery  Patient has a bowel movement 1-2 a day.  Denies much in the way of rectal bleeding.  He claims he can walk a mile without much problems.  No inflammatory bowel disease, irritable bowel syndrome, allergy such as Celiac Sprue, dietary/dairy problems, colitis, ulcers nor gastritis.  No recent sick  contacts/gastroenteritis.  No travel outside the country.  No changes in diet.  He was found to have a thyroid nodule on a CT of the chest/abdomen/pelvis.  Ultrasound confirmed a.  Biopsy done.  Consistent with follicular lesion.  2.2 cm in size   Patient Active Problem List  Diagnosis  . DIABETES MELLITUS, TYPE II  . OBESITY  . HYPERTENSION, BENIGN ESSENTIAL  . ECZEMA  . COLONIC POLYPS, HX OF  . VITAMIN B12 DEFICIENCY  . UNSPECIFIED VITAMIN D DEFICIENCY  . Routine general medical examination at a health care facility  . Pure hypercholesterolemia    Past Medical History  Diagnosis Date  . Diabetes mellitus   . Colon polyps     he had declined f/u colonoscopy after inital colonoscopy  . Hypertension   . Arthritis   . Nasal congestion   . Colonic mass     Past Surgical History  Procedure Date  . Knee arthroscopy 1998    Right knee    History   Social History  . Marital Status: Married    Spouse Name: N/A    Number of Children: 0  . Years of Education: N/A   Occupational History  . 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 Smoking status: Former Smoker    Quit date: 08/07/1991  .  Smokeless tobacco: Never Used   Comment: quit in 1993  . Alcohol Use: No  . Drug Use: No  . Sexually Active: Not on file   Other Topics Concern  . 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 Uterine cancer Mother   . Hypertension Mother   . Stroke Mother   . Colon cancer Mother   . Colon polyps Mother   . Cancer Mother     colon, endometrial  . Diabetes Father   . Obesity Father   . Pneumonia Father   . Cancer Father     skin - squamous   . Diabetes Paternal Grandmother   . Prostate cancer Neg Hx     Current Outpatient Prescriptions    Medication Sig Dispense Refill  . cholecalciferol (VITAMIN D) 1000 UNITS tablet Take 1,000 Units by mouth daily.      . clotrimazole-betamethasone (LOTRISONE) cream Apply topically 2 (two) times daily.        . CRESTOR 10 MG tablet take 1 tablet by mouth at bedtime  30 tablet  11  . fluticasone (FLONASE) 50 MCG/ACT nasal spray Place 2 sprays into the nose daily.  16 g  12  . glimepiride (AMARYL) 2 MG tablet Take 1 tablet (2 mg total) by mouth 2 (two) times daily.  60 tablet  12  . glucose blood (ACCU-CHEK AVIVA PLUS) test strip 1 each by Other route 2 (two) times daily. Use as instructed  100 each  9  . lisinopril (PRINIVIL,ZESTRIL) 5 MG tablet Take 1 tablet (5 mg total) by mouth daily.  30 tablet  12  . metFORMIN (GLUCOPHAGE) 1000 MG tablet Take 1 tablet (1,000 mg total) by mouth 2 (two) times daily with a meal.  60 tablet  12  . saxagliptin HCl (ONGLYZA) 5 MG TABS tablet Take 5 mg by mouth daily.       . vitamin B-12 (CYANOCOBALAMIN) 1000 MCG tablet Take 1,000 mcg by mouth daily.         Allergies  Allergen Reactions  . Ibuprofen     Joint swelling    BP 134/78  Pulse 70  Temp 97.6 F (36.4 C) (Temporal)  Resp 16  Ht 5\' 11"  (1.803 m)  Wt 274 lb 4 oz (124.399 kg)  BMI 38.25 kg/m2  Ct Chest W Contrast  03/28/2012  **ADDENDUM** CREATED: 03/28/2012 15:56:28  Addendum for clarification:  As noted in the CT chest findings/impression, there is a 2.0 cm left thyroid nodule, unrelated to the presenting complaint. However, if not previously characterized, a thyroid ultrasound is suggested for further evaluation.  **END ADDENDUM** SIGNED BY: Charline Bills, M.D.   03/28/2012  *RADIOLOGY REPORT*  Clinical Data:  Heme positive stool, recent colonoscopy with two suspicious polyps.  CT CHEST, ABDOMEN AND PELVIS WITH CONTRAST  Technique:  Multidetector CT imaging of the chest, abdomen and pelvis was performed following the standard protocol during bolus administration of intravenous contrast.   Contrast: OMNIPAQUE IOHEXOL 300 MG/ML  SOLN  Comparison:  None.  CT CHEST  Findings:  Lungs are clear.  No suspicious pulmonary nodules.  No pleural effusion or pneumothorax.  The visualized thyroid is notable for a 2.0 cm left thyroid nodule.  The heart is normal in size.  No pericardial effusion.  No suspicious mediastinal, hilar, or axillary lymphadenopathy.  Degenerative changes of the  thoracic spine.  IMPRESSION: No evidence of metastatic disease in the chest.  2.0 cm left thyroid nodule.  CT ABDOMEN AND PELVIS  Findings:  Liver, spleen, and pancreas are within normal limits.  Low density bilateral adrenal nodules, favored to reflect adrenal adenomas, although strictly indeterminate. Right adrenal nodule measures 3.1 x 1.9 cm.  Dominant left adrenal nodule measures 1.7 x 1.3 cm.  Multiple left renal sinus cysts.  Right kidney is unremarkable.  No hydronephrosis.  No evidence of bowel obstruction.  Normal appendix.  Eccentric wall thickening involving the ascending colon (series 3/image 60; coronal image 63), suspicious for primary colonic adenocarcinoma.  Associated small ileocolic lymph nodes measuring up to 8 mm short axis (series 3/image 53).  Additional small retroperitoneal/pelvic lymph nodes which do not meet pathologic CT size criteria.  No evidence of abdominal aortic aneurysm.  No abdominopelvic ascites.  Mild prostatomegaly, measuring 5.4 cm in transverse dimension.  Bladder is mildly thick-walled although underdistended.  Degenerative changes of the lumbar spine.  Multiple probable bone islands in the bilateral pelvis.  IMPRESSION: Eccentric wall thickening involving the ascending colon, suspicious for primary colonic adenocarcinoma.  Associated small ileocolic lymph nodes measuring up to 8 mm short axis.  Otherwise, no evidence of metastatic disease in the abdomen/pelvis.  Original Report Authenticated By: Charline Bills, M.D.    US Soft Tissue Head/neck  04/04/2012  *RADIOLOGY REPORT*   Clinical Data: Thyroid nodule.  THYROID ULTRASOUND  Technique: Ultrasound examination of the thyroid gland and adjacent soft tissues was performed.  Comparison:  Chest CT 03/28/2012.  Findings:  Right thyroid lobe:  41 mm x 15 mm x 18 mm.  3 mm hypoechoic minuscule nodule in the inferior pole.  Mildly heterogeneous echotexture. Left thyroid lobe:  45 mm x 21 mm x 22 mm.  Mildly heterogeneous echotexture. Isthmus:  6 mm.  Focal nodules:  22 mm x 20 mm x 18 mm solid nodule in the inferior left thyroid lobe.  Internal vascular flow is present.  This meets criteria for thyroid biopsy.  Lymphadenopathy:  There is a prominent lymph node in the right neck deep to the sternocleidomastoid measuring 29 mm x 11 mm x 6 mm within normal fatty hilum.  No other enlarged lymph nodes and this probably within normal limits.  IMPRESSION: Solid left inferior pole thyroid nodule measuring 22 x 20 x 18 mm. Ultrasound-guided biopsy recommended.   Original Report Authenticated By: Andreas Newport, M.D.    Ct Abdomen Pelvis W Contrast  03/28/2012  **ADDENDUM** CREATED: 03/28/2012 15:56:28  Addendum for clarification:  As noted in the CT chest findings/impression, there is a 2.0 cm left thyroid nodule, unrelated to the presenting complaint. However, if not previously characterized, a thyroid ultrasound is suggested for further evaluation.  **END ADDENDUM** SIGNED BY: Charline Bills, M.D.   03/28/2012  *RADIOLOGY REPORT*  Clinical Data:  Heme positive stool, recent colonoscopy with two suspicious polyps.  CT CHEST, ABDOMEN AND PELVIS WITH CONTRAST  Technique:  Multidetector CT imaging of the chest, abdomen and pelvis was performed following the standard protocol during bolus administration of intravenous contrast.  Contrast: OMNIPAQUE IOHEXOL 300 MG/ML  SOLN  Comparison:  None.  CT CHEST  Findings:  Lungs are clear.  No suspicious pulmonary nodules.  No pleural effusion or pneumothorax.  The visualized thyroid is notable for a 2.0  cm left thyroid nodule.  The heart is normal in size.  No pericardial effusion.  No suspicious mediastinal, hilar, or axillary lymphadenopathy.  Degenerative changes  of the thoracic spine.  IMPRESSION: No evidence of metastatic disease in the chest.  2.0 cm left thyroid nodule.  CT ABDOMEN AND PELVIS  Findings:  Liver, spleen, and pancreas are within normal limits.  Low density bilateral adrenal nodules, favored to reflect adrenal adenomas, although strictly indeterminate. Right adrenal nodule measures 3.1 x 1.9 cm.  Dominant left adrenal nodule measures 1.7 x 1.3 cm.  Multiple left renal sinus cysts.  Right kidney is unremarkable.  No hydronephrosis.  No evidence of bowel obstruction.  Normal appendix.  Eccentric wall thickening involving the ascending colon (series 3/image 60; coronal image 63), suspicious for primary colonic adenocarcinoma.  Associated small ileocolic lymph nodes measuring up to 8 mm short axis (series 3/image 53).  Additional small retroperitoneal/pelvic lymph nodes which do not meet pathologic CT size criteria.  No evidence of abdominal aortic aneurysm.  No abdominopelvic ascites.  Mild prostatomegaly, measuring 5.4 cm in transverse dimension.  Bladder is mildly thick-walled although underdistended.  Degenerative changes of the lumbar spine.  Multiple probable bone islands in the bilateral pelvis.  IMPRESSION: Eccentric wall thickening involving the ascending colon, suspicious for primary colonic adenocarcinoma.  Associated small ileocolic lymph nodes measuring up to 8 mm short axis.  Otherwise, no evidence of metastatic disease in the abdomen/pelvis.  Original Report Authenticated By: Charline Bills, M.D.    US Thyroid Biopsy  04/10/2012  *RADIOLOGY REPORT*  Clinical data:  Thyroid nodule noted on recent CT chest and ultrasound studies.  ULTRASOUND-GUIDED THYROID ASPIRATION BIOPSY  Technique:  Survey ultrasound was performed and the dominant lesion in the inferior left lobe was localized.   An appropriate skin entry site was determined.  Skin was marked, then prepped with Betadine, draped in usual sterile fashion, and infiltrated locally with 1% lidocaine.  Under real-time ultrasound guidance, 4  passes were made into the lesion with 25 gauge needles.  The patient tolerated procedure well, with no immediate complications.  IMPRESSION 1.  Technically successful ultrasound-guided thyroid aspiration biopsy   Original Report Authenticated By: Osa Craver, M.D.      Review of Systems  Constitutional: Negative for fever, chills and diaphoresis.  HENT: Negative for nosebleeds, sore throat, facial swelling, mouth sores, trouble swallowing and ear discharge.   Eyes: Negative for photophobia, discharge and visual disturbance.  Respiratory: Negative for choking, chest tightness, shortness of breath and stridor.   Cardiovascular: Negative for chest pain and palpitations.  Gastrointestinal: Negative for nausea, vomiting, abdominal pain, diarrhea, constipation, blood in stool, abdominal distention, anal bleeding and rectal pain.  Genitourinary: Negative for dysuria, urgency, difficulty urinating and testicular pain.  Musculoskeletal: Negative for myalgias, back pain, arthralgias and gait problem.  Skin: Negative for color change, pallor, rash and wound.  Neurological: Negative for dizziness, speech difficulty, weakness, numbness and headaches.  Hematological: Negative for adenopathy. Does not bruise/bleed easily.  Psychiatric/Behavioral: Negative for hallucinations, confusion and agitation.       Objective:   Physical Exam  Constitutional: He is oriented to person, place, and time. He appears well-developed and well-nourished. No distress.  HENT:  Head: Normocephalic.  Mouth/Throat: Oropharynx is clear and moist. No oropharyngeal exudate.  Eyes: Conjunctivae and EOM are normal. Pupils are equal, round, and reactive to light. No scleral icterus.  Neck: Normal range of motion. Neck  supple. No tracheal tenderness present. Carotid bruit is not present. No tracheal deviation and no edema present. No thyromegaly present.       Thick neck.  Hard to feel discrete mass  Cardiovascular:  Normal rate, regular rhythm and intact distal pulses.   Pulmonary/Chest: Effort normal and breath sounds normal. No stridor. No respiratory distress.  Abdominal: Soft. He exhibits no distension and no mass. There is no tenderness. There is no rebound and no guarding. Hernia confirmed negative in the right inguinal area and confirmed negative in the left inguinal area.       Morbidly obese with moderate panniculus.  Beltline at umbilicus  Musculoskeletal: Normal range of motion. He exhibits no tenderness.  Lymphadenopathy:    He has no cervical adenopathy.       Right: No inguinal adenopathy present.       Left: No inguinal adenopathy present.  Neurological: He is alert and oriented to person, place, and time. No cranial nerve deficit. He exhibits normal muscle tone. Coordination normal.  Skin: Skin is warm and dry. No rash noted. He is not diaphoretic. No erythema. No pallor.  Psychiatric: He has a normal mood and affect. His behavior is normal. Judgment and thought content normal.       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  He needs total proctocolectomy     Plan:     I spent >9minutes reviewing his data, examining him, discussing with colleagues, discussing with the patient and family.  Numerous questions were answered.  He is getting appropriate screening of his upper digestive tract by Dr. Christella Hartigan to rule out any foregut polyp/cancer.  The patient requires total proctocolectomy.  I think he is a reasonable laparoscopic candidate even though he is morbidly obese.  I will do this in conjunction with her new colorectal surgeon, Dr. [name], with whom I discussed this case at length.   I discussed the indications risks benefits alternatives and procedure in detail with the patient, his wife, his sister:  The anatomy & physiology of the digestive tract was discussed.  The pathophysiology was discussed.  Natural history risks without surgery was discussed.   I worked to give an overview of the disease and the frequent need to have multispecialty involvement.  I feel the risks of no intervention will lead to serious problems that outweigh the operative risks; therefore, I recommended a total proctocolectomy to remove the pathology.  Laparoscopic & open techniques were discussed.  We will work to preserve anal & pelvic floor function without sacrificing cure.  Plan to do ileal pouch anal anastomosis.  He will need at least temporary loop ileostomy diversion of the anastomosis.  I did discuss risks of possible permanent ileostomy.  Possible issues of pouchitis etc. Postoperatively were discussed as well.  Risks such as bleeding, infection, abscess, leak, reoperation, possible ostomy, hernia, heart attack, death, and other risks were discussed.  I noted a good likelihood this will help address the problem.   Goals of post-operative recovery were discussed as well.  We will work to minimize complications.  An educational handout on the pathology was given as well.  It is reasonable to get a second opinion at the other night major academic centers, especially [facility].  Questions were answered.  The patient, wife & sister  express understanding & wish to consider surgery.  The sister seems to get the best.  The wife the least.  He wants to pray about it first.  He promised me he will call to give me his answer.  The thyroid nodule is a follicular lesion.  He would benefit from left  thyroid lobectomy to ensure it is not a cancer (vs adenoma).  I will table this issue until the familial polyposis is addressed.  Not a good idea to try and do both surgeries at the same time, but this should not be ignored.   Will try to discuss w him more later.

## 2012-04-14 NOTE — Patient Instructions (Addendum)
See the Handout(s) we gave you.  Consider surgery.  Please call our office at [phone] if you wish to schedule surgery or if you have further questions / concerns.    What is Familial Adenomatous Polyposis (FAP)?  FAP is an inherited colorectal cancer syndrome and accounts for 1 percent of all cases of colorectal cancer. The "F" stands for familial, meaning it runs in families; "A" stands for adenomatous, the type of polyps detected in the colon and small intestine that can turn into cancer; and "P" stands for polyposis, or the condition of having lots of colon polyps. The gene for FAP is on the long arm of chromosome 5 and is called the APC gene. Patients with FAP develop hundreds to thousands of colon polyps, usually starting in the teens. All patients will develop colorectal cancer from the colon polyps usually by age 72. Patients with FAP must have the colon, and sometimes the rectum, removed to prevent colon cancer. Since the abnormal gene that causes FAP is present in all of the body's cells, other organs may develop growths.  In more than 80 percent of patients with FAP, polyps form in the stomach and small intestine. The polyps found in the upper portion of the stomach are called fundic gland polyps. Some patients have none, while others may have a carpeting of these polyps. These polyps should be biopsied once to confirm their microscopic makeup. Fundic gland polyps do not turn into cancer and usually do not require any special treatment. The polyps found in the bottom of the stomach (the antrum) most often are precancerous polyps called adenomas. They should be biopsied and completely removed because of the risk of cancer.  Polyps in the duodenum (the first part of the small intestine) are adenomas and also can turn into cancer. In fact, duodenal cancer is the second leading cause of cancer deaths in patients with FAP once the colon has been removed. The overall risk of duodenal cancer  in FAP is about 4 percent. If patients have severe or advanced duodenal polyposis, the risk can be as high as 25 percent. Duodenal polyps can be very subtle and hard to detect, or they may be obvious and cover a large segment of the duodenum. Often, they are found at the opening of the bile duct and the duodenum, called the papilla. Because of the great number of polyps, they usually are not removed. However, they need to be checked regularly for the remainder of the patient's life. Biopsy of any large or concerning duodenal polyps and the papilla, even if the papilla appears normal, should be performed regularly. The other organs that form tumors include the skin, bones, eyes (congenital hypertrophy of the retinal pigment epithelium CHRPE), thyroid and abdomen (desmoid tumors). Extra teeth may also form in patients with FAP. Gardner's syndrome and FAP are due to an inherited mutation in the APC gene. Attenuated FAP is a variant of FAP where there are fewer polyps in the colon. The colorectal polyps and cancer in attenuated FAP usually occur later in life than in FAP; however, the risk of colon cancer and upper gastrointestinal polyps is the same as in FAP. How is FAP Diagnosed? Family History  The first step in making the diagnosis of FAP is based upon the family history of colorectal polyps and cancer. The majority (60 percent to 70 percent) of patients with FAP have inherited the gene from one of their parents. Therefore, consecutive generations may have FAP. Taking a careful family  history is crucial in diagnosing FAP. In about 30 percent of FAP patients, the abnormal gene was produced at the time of conception. In these patients, no family history of FAP would be found. All of the next generations are at risk of inheriting the newly mutated gene.  Genetic Testing A significant breakthrough in the diagnosis of FAP was made with the discovery of the gene on chromosome 5. Now, family members at risk of  FAP may have their blood taken and analyzed for the FAP gene mutation (called APC). The APC gene mutation is able to be detected in more than 80 percent of people with FAP. In 20 percent of people with FAP, the gene abnormality cannot be detected with the blood test. Gene tests do not detect cancer or polyps themselves. They only provide evidence that a person has acquired the gene mutation and has FAP. There are two widely used methods for detecting mutations: One is a protein truncation test, and the other is direct DNA sequencing. Gene testing should first be done on a family member with FAP. If the mutation is found, other family members can be tested. If the mutation is not found, blood testing is not helpful to test family members to see if they have FAP. Interpreting the results of the FAP genetic test is tricky; therefore, gene testing should only be performed by experts in FAP in the genetic counseling setting. There are many advantages of gene testing. There are also important psychologic and family issues that must be discussed before and after gene tests are performed. It is important to remember that gene tests do not detect cancer or polyps; they only provide evidence that a person has acquired the gene mutation and has FAP. Symptoms Because 30 percent of people with FAP have a new (spontaneous) mutation and no family history of polyposis, they may not know they have FAP until symptoms of polyps or cancer develop. Symptoms may include bleeding from the rectum, change in bowel habits, abdominal pain, low blood counts or unexplained weight loss. An examination of the colon to determine the cause of symptoms may reveal polyposis. Colon Examinations  There are many ways to examine the colon. These include the use of a thin flexible tube called a scope, or by an x-ray examination called a barium enema. If hundreds to thousands of colorectal polyps are detected on either of these examinations, the  diagnosis of FAP is suspected. To confirm the diagnosis of FAP, a scope test should be performed. The scope is the best test to evaluate the lining of the colon, because biopsy or removal of some polyps can be done during the procedure. The short scope that examines only the lower one or two feet of the colon is called a flexible sigmoidoscope. The examination with the flexible sigmoidoscope takes about five to ten minutes and requires an enema or two just before the scope is inserted. No sedation is given. The examination of the whole six feet of the colon with a longer scope is called a colonoscopy. For the colonoscopy, patients drink a solution to cleanse the colon the night before the test. The doctor usually gives a sedative through the vein just before inserting the scope. The exam takes approximately twenty minutes and is usually not uncomfortable. Someone must drive the patient home (because of the sedative). They leave shortly after the procedure and can resume light duties the rest of the day. What is the treatment when polyposis is found? Surgery Since colonic  polyps form by the hundreds and thousands in FAP, they cannot be removed individually. Surgical removal of the colon is the only effective treatment. While the thought of having surgery may be upsetting, it must be done to prevent colon cancer from forming.  Polyps begin forming as young as puberty. When polyps are detected, a colorectal surgeon should be involved in the care of the patient to help guide the timing of surgery. There have been advances in the way that operations are performed. Depending on the situation, any of the following operations may be performed with the aid of small holes in the abdomen called laparoscopy or through the standard abdominal incision called laparotomy. The most common surgical treatments are:  Total colectomy and ileorectal anastamosis (IRA)  Patients with few polyps in the rectum usually undergo this  procedure. During the operation, the surgeon removes the patient's colon but leaves five inches of the rectum. The small intestine, or ileum, is then surgically joined to the upper rectum. Afterward, the patient has normal bowel function.  Colectomy with ileoanal pouch (restorative proctocolectomy)  This surgical procedure is successful in selected FAP patients. The surgeon removes the colon and the rectum, leaving the anal canal and the anal sphincter muscles. A new rectum is made from the small intestine and attached to the anal canal. Sometimes a temporary opening in the abdomen, called an ileostomy or stoma, is created. After the first operation has healed, the ileostomy is closed, restoring normal bowel function. Proctocolectomy and ileostomy  This procedure is recommended for patients with rectal cancer who cannot have the other operations. Both the colon and rectum are removed, and a permanent ileostomy is created. Patients then wear a bag to collect wastes that leave the body through the ileostomy. How long is the hospital stay? The length of stay in the hospital depends on the type of surgery performed. The usual stay is less than one week.  What about recovery? After hospital discharge, recuperation at home is four to six weeks. Patients usually return to work or school in six to eight weeks.Your doctor will discuss this with you at your post-operative visit four to six weeks after surgery. What lifestyle changes can be expected? Most patients are able to eat normal diets and lead normal lives following surgery. Some people notice more frequent bowel movements. Otherwise, their lives will be perfectly normal. Their sexual and social activities are unaffected. None of the procedures affects a man's ability to father children nor a woman's ability to have a normal pregnancy. However, the way in which a baby is delivered may be affected by the type of surgery and should be discussed with the  surgeon. Medicine While surgery is most common for treating the colon polyps, it does not cure the disease. Polyps continue to form in the pouch, rectum or small intestine. A variety of medications have been developed to cause the regression (shrink existing polyps) or prevention of recurrent polyps. While these medications help control the colorectal polyps, they should only be used in conjunction with ongoing check ups and do not replace the usual care of patients with FAP. The first medication that was found to shrink colon polyps in patients with FAP was an anti-inflammatory medicine called sulindac. It is a commonly used anti-arthritis medicine called NSAID (nonsteroidal anti-inflammatory drug). While it is not approved by the Food and Drug Administration (FDA) for the treatment of FAP, it has been used successfully in pill form in the [place] and as an  enema in [place] by many gastroenterologists. Unfortunately, many patients experience side effects from sulindac and cannot continue to take it. The majority of the side effects are related the stomach, including discomfort and ulcer. An arthritis medication called celecoxib was approved by the FDA for the treatment of colon polyps in patients with FAP. The medicine is called a COX-2 inhibitor. It has less of the gastrointestinal side effects than the traditional NSAIDs. In a six-month study, celebrex was found to shrink existing colorectal polyps in patients with FAP. It was also well-tolerated. The dose is 400 mg twice a day. This medication can be used to help control adenomatous polyps on the colon and rectum, in addition to regular colorectal checks and polyp removal when necessary. What testing is needed to keep patients with FAP or at risk for FAP healthy? Patients with FAP or family members at risk for FAP should have regular checkups of the colon, rectum or pouch and the upper gastrointestinal tract. This is called surveillance. These checks  begin in early adolescence and continue even after surgery or until there is clear evidence that someone has not inherited the FAP gene. Colon Checks If a relative has had genetic testing and does not carry the family mutation, the person does not have FAP and does not need checkups. For relatives at risk of FAP (brothers, sisters, children of patients with FAP) and patients who have inherited the family FAP mutation, colon checks with a flexible sigmoidoscope or colonoscope should begin at approximately 55 years of age. Examinations are generally performed yearly. The frequency of examinations may change based upon the size and number of polyps detected at the examination. Once polyps are detected, a surgeon with expertise in FAP should be involved in the patient's care. If no polyps are detected, yearly colonoscopies should continue approximately until age 64. After that age, patients should continue with routine colorectal cancer screening. If patients have attenuated FAP, colon checkups may be required after the age of 25. After the colon has been removed, checks of the remaining rectum or pouch continue for the rest of the patient's life. The reason is that polyps will continue to form in the rectum or the pouch after surgery. If polyps are found, they are burned out before turning cancerous. The examination is usually performed with a flexible sigmoidoscope. The interval for the examination is one year, but may vary from three to12 months depending on the number and size of the polyps detected. Sometimes the polyps in the rectum are so numerous that the doctor may recommend the rectum be surgically removed to minimize the risk of cancer. Stomach and Duodenum Since upper gastrointestinal polyps occur in up to 80 percent of patients with FAP, checks of the stomach, duodenum and especially the papilla are required. [facility] specialists recommend the initial upper endoscopic examination be done at the  time of colon surgery, or about the age of 26.  Both a forward and a side viewing endoscopic examination with biopsy of the papilla should be performed. The interval for these examinations is three years if the exam is normal or typical FAP polyps are found. If there are more advanced polyps detected on the examination, then the upper endoscopy should be done more frequently. The end-viewing endoscope is used to examine the upper GI tract. The papilla cannot be seen adequately with this instrument.  The side-viewing duodenoscope allows careful examination of the papilla. Prevention Through Participation in a Registry The goal of the Inherited Colorectal Cancer  Registries [employer]) at [facility] is to prevent needless deaths from colon cancer. To reach this goal, the registries staff tries to make sure that patients receive early diagnosis and treatment. The registries accomplish this by screening family members of patients who may be at risk for colorectal cancer. The staff also ensures that patients diagnosed with the disease are aware of the necessary screenings and follow-up visits to their physicians. Because colorectal cancer and polyps have such a strong hereditary link, keeping track of patients and their family members through a registry saves lives. Referrals to the registry vary. Patients often make their own appointments. A community physician may send a patient to one of [facility]'s colorectal physicians, or a patient may hear about the registries from other family members who are [facility] patients. A registries team member then will ask the patient a series of questions about his or her family, in particular which family members have developed polyps or colorectal cancer. With the answers to those questions, the registries coordinator will determine that patient's family history and construct a family tree. This family tree can help determine other family members' risk for  developing colon cancer. There are many advantages to being included in the registries. Patients and their family members will receive annual screening letters, booklets and newsletters. The registries team also can answer questions, make appointments, and share new research developments and results from clinical trials with patients and families in the registries.

## 2012-04-15 ENCOUNTER — Encounter: Payer: Self-pay | Admitting: Gastroenterology

## 2012-04-15 ENCOUNTER — Ambulatory Visit (AMBULATORY_SURGERY_CENTER): Payer: BC Managed Care – PPO | Admitting: *Deleted

## 2012-04-15 VITALS — Ht 71.0 in | Wt 270.0 lb

## 2012-04-15 DIAGNOSIS — D126 Benign neoplasm of colon, unspecified: Secondary | ICD-10-CM

## 2012-04-22 ENCOUNTER — Ambulatory Visit (AMBULATORY_SURGERY_CENTER): Payer: BC Managed Care – PPO | Admitting: Gastroenterology

## 2012-04-22 ENCOUNTER — Encounter: Payer: Self-pay | Admitting: Gastroenterology

## 2012-04-22 VITALS — BP 149/77 | HR 84 | Temp 97.7°F | Resp 25 | Ht 71.0 in | Wt 270.0 lb

## 2012-04-22 DIAGNOSIS — K2980 Duodenitis without bleeding: Secondary | ICD-10-CM

## 2012-04-22 DIAGNOSIS — R933 Abnormal findings on diagnostic imaging of other parts of digestive tract: Secondary | ICD-10-CM

## 2012-04-22 DIAGNOSIS — D126 Benign neoplasm of colon, unspecified: Secondary | ICD-10-CM

## 2012-04-22 DIAGNOSIS — D1330 Benign neoplasm of unspecified part of small intestine: Secondary | ICD-10-CM

## 2012-04-22 LAB — GLUCOSE, CAPILLARY
Glucose-Capillary: 138 mg/dL — ABNORMAL HIGH (ref 70–99)
Glucose-Capillary: 122 mg/dL — ABNORMAL HIGH (ref 70–99)

## 2012-04-22 MED ORDER — SODIUM CHLORIDE 0.9 % IV SOLN: 500.0000 mL | INTRAVENOUS | Status: DC

## 2012-04-22 NOTE — Op Note (Signed)
[facility] [address] [place], [address]   ENDOSCOPY PROCEDURE REPORT  PATIENT: Cory, Harper  MR#: [id number] BIRTHDATE: 06/06/1956 , 55  yrs. old GENDER: Male ENDOSCOPIST: Rachael Fee, MD PROCEDURE DATE:  04/22/2012 PROCEDURE:  EGD w/ biopsy ASA CLASS:     Class III INDICATIONS:  known FAP. MEDICATIONS: Fentanyl 50 mcg IV, Versed 6 mg IV, and These medications were titrated to patient response per physician's verbal order TOPICAL ANESTHETIC: Cetacaine Spray  DESCRIPTION OF PROCEDURE: After the risks benefits and alternatives of the procedure were thoroughly explained, informed consent was obtained.  The LB GIF-H180 [id number] endoscope was introduced through the mouth and advanced to the second portion of the duodenum. Without limitations.  The instrument was slowly withdrawn as the mucosa was fully examined.   The mucosa was slightly irregular (not noeplastic appearing) in duodenal bulb.  Biopsies were taken from the bulb and sent to pathology.  No discrete duodenal lesions, with good views of major papilla.  Retroflexed views revealed no abnormalities.     The scope was then withdrawn from the patient and the procedure completed.  COMPLICATIONS: There were no complications. ENDOSCOPIC IMPRESSION: Slightly irregular duodenal mucosa, biopsied.  This did not appearing neoplastic, but was biopsied to be certain.Marland Kitchen  RECOMMENDATIONS: Await final pathology. He will need repeat upper endoscopy (preferably with side viewing duodenoscope) in 3 years given FAP. He has not yet called Dr.  Michaell Cowing to schedule the surgery but is still planning to.  I encouraged him to make that call.  REPEAT EXAM:  eSigned:  Rachael Fee, MD 04/22/2012 3:02 PM    cc: Crawford Givens, MD; Karie Soda, MD

## 2012-04-22 NOTE — Progress Notes (Signed)
Patient did not experience any of the following events: a burn prior to discharge; a fall within the facility; wrong site/side/patient/procedure/implant event; or a hospital transfer or hospital admission upon discharge from the facility. (G8907) Patient did not have preoperative order for IV antibiotic SSI prophylaxis. (G8918)  

## 2012-04-22 NOTE — Patient Instructions (Signed)
YOU HAD AN ENDOSCOPIC PROCEDURE TODAY AT THE [facility]: Refer to the procedure report that was given to you for any specific questions about what was found during the examination.  If the procedure report does not answer your questions, please call your gastroenterologist to clarify.  If you requested that your care partner not be given the details of your procedure findings, then the procedure report has been included in a sealed envelope for you to review at your convenience later.  YOU SHOULD EXPECT: Some feelings of bloating in the abdomen. Passage of more gas than usual.  Walking can help get rid of the air that was put into your GI tract during the procedure and reduce the bloating. If you had a lower endoscopy (such as a colonoscopy or flexible sigmoidoscopy) you may notice spotting of blood in your stool or on the toilet paper. If you underwent a bowel prep for your procedure, then you may not have a normal bowel movement for a few days.  DIET: Your first meal following the procedure should be a light meal and then it is ok to progress to your normal diet.  A half-sandwich or bowl of soup is an example of a good first meal.  Heavy or fried foods are harder to digest and may make you feel nauseous or bloated.  Likewise meals heavy in dairy and vegetables can cause extra gas to form and this can also increase the bloating.  Drink plenty of fluids but you should avoid alcoholic beverages for 24 hours.  ACTIVITY: Your care partner should take you home directly after the procedure.  You should plan to take it easy, moving slowly for the rest of the day.  You can resume normal activity the day after the procedure however you should NOT DRIVE or use heavy machinery for 24 hours (because of the sedation medicines used during the test).    SYMPTOMS TO REPORT IMMEDIATELY: A gastroenterologist can be reached at any hour.  During normal business hours, 8:30 AM to 5:00 PM Monday through Friday,  call [phone].  After hours and on weekends, please call the GI answering service at [phone] who will take a message and have the physician on call contact you.  Following upper endoscopy (EGD)  Vomiting of blood or coffee ground material  New chest pain or pain under the shoulder blades  Painful or persistently difficult swallowing  New shortness of breath  Fever of 100F or higher  Black, tarry-looking stools  FOLLOW UP: If any biopsies were taken you will be contacted by phone or by letter within the next 1-3 weeks.  Call your gastroenterologist if you have not heard about the biopsies in 3 weeks.  Our staff will call the home number listed on your records the next business day following your procedure to check on you and address any questions or concerns that you may have at that time regarding the information given to you following your procedure. This is a courtesy call and so if there is no answer at the home number and we have not heard from you through the emergency physician on call, we will assume that you have returned to your regular daily activities without incident.  SIGNATURES/CONFIDENTIALITY: You and/or your care partner have signed paperwork which will be entered into your electronic medical record.  These signatures attest to the fact that that the information above on your After Visit Summary has been reviewed and is understood.  Full responsibility of   the confidentiality of this discharge information lies with you and/or your care-partner. 

## 2012-04-23 ENCOUNTER — Telehealth: Payer: Self-pay | Admitting: *Deleted

## 2012-04-23 ENCOUNTER — Telehealth (INDEPENDENT_AMBULATORY_CARE_PROVIDER_SITE_OTHER): Payer: Self-pay

## 2012-04-23 NOTE — Telephone Encounter (Signed)
  Follow up Call-  Call back number 04/22/2012 03/25/2012  Post procedure Call Back phone  # [phone]  Permission to leave phone message Yes Yes     Patient questions:  Do you have a fever, pain , or abdominal swelling? no Pain Score  0 *  Have you tolerated food without any problems? yes  Have you been able to return to your normal activities? yes  Do you have any questions about your discharge instructions: Diet   no Medications  no Follow up visit  no  Do you have questions or concerns about your Care? no  Actions: * If pain score is 4 or above: No action needed, pain <4.

## 2012-04-23 NOTE — Telephone Encounter (Signed)
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 The pt does wish to proceed with scheduling both surgeries to be done at the same time by Dr Michaell Cowing but the pt is waiting on some dates from his sister that lives out of town. The pt wants his sister to come stay with him while he has surgery so he is working on getting those dates. I advised pt that I was going to turn his orders into scheduling so they can start working on finding a date b/c this is very long surgery and it will take some time getting Dr Michaell Cowing with another surgeon together for 7.5 hours on the same day. The pt does want to talk to Dr Michaell Cowing about his thyroid surgery. I will notify Dr Michaell Cowing to contact pt over the phone. The pt understands.

## 2012-04-23 NOTE — Telephone Encounter (Signed)
Called and discussed with the patient concerning recommendations for total proctocolectomy with ileal pouch anal anastomosis and diverting loop ileostomy.  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  I recommended thyroid lobectomy with possibility of total thyroidectomy if cancer noted at a later time.  He wished just to get all of it done at once.  He is discussed with family.  He has been praying.  He feels comfortable with [place] managing this for him.  He wishes to proceed with surgery.  I discussed the procedure again in detail to him.  Questions were answered.  Risks benefits alternatives were discussed.  Possibility of ileostomy being permanent discussed although the hope is that it will be temporary.  He wishes to proceed with surgery.

## 2012-04-25 ENCOUNTER — Telehealth (INDEPENDENT_AMBULATORY_CARE_PROVIDER_SITE_OTHER): Payer: Self-pay

## 2012-04-25 NOTE — Telephone Encounter (Signed)
Emailed request for her to do a pre op marking on the pt before his sx on 10/15 at [facility].

## 2012-04-29 ENCOUNTER — Other Ambulatory Visit: Payer: BC Managed Care – PPO

## 2012-04-29 ENCOUNTER — Other Ambulatory Visit: Payer: Self-pay

## 2012-04-29 DIAGNOSIS — K900 Celiac disease: Secondary | ICD-10-CM

## 2012-04-30 ENCOUNTER — Telehealth: Payer: Self-pay | Admitting: Family Medicine

## 2012-04-30 LAB — CELIAC PANEL 10
Gliadin IgG: 21 U/mL — ABNORMAL HIGH (ref <20)
Tissue Transglut Ab: 26.1 U/mL — ABNORMAL HIGH (ref <20)
Tissue Transglutaminase Ab, IgA: 19.3 U/mL (ref <20)

## 2012-04-30 NOTE — Telephone Encounter (Signed)
I would have him get through the surgery and get the routine post op visits with the surgical team.  Push his DM2 f/u visit with me back to 11/13.  visit.  Thanks.

## 2012-04-30 NOTE — Telephone Encounter (Signed)
The patient called and stated he has surgery scheduled for October 15th.  He is hoping to find out if you want him to come in prior to surgery, or post surgery.  I'll be happy to move his appt back if you would rather see him as a surgery follow up. Thanks!

## 2012-05-01 NOTE — Telephone Encounter (Signed)
Patient advised.  Lab and OV rescheduled to November.

## 2012-05-07 ENCOUNTER — Encounter (HOSPITAL_COMMUNITY): Payer: Self-pay | Admitting: Pharmacy Technician

## 2012-05-13 ENCOUNTER — Encounter (HOSPITAL_COMMUNITY): Payer: Self-pay

## 2012-05-13 ENCOUNTER — Other Ambulatory Visit (INDEPENDENT_AMBULATORY_CARE_PROVIDER_SITE_OTHER): Payer: Self-pay | Admitting: Surgery

## 2012-05-13 ENCOUNTER — Encounter (HOSPITAL_COMMUNITY)
Admission: RE | Admit: 2012-05-13 | Discharge: 2012-05-13 | Disposition: A | Discharge status: Home / Self Care | Payer: BC Managed Care – PPO | Source: Ambulatory Visit | Attending: Surgery | Admitting: Surgery

## 2012-05-13 HISTORY — DX: Cardiac murmur, unspecified: R01.1

## 2012-05-13 LAB — CBC
Platelets: 284 10*3/uL (ref 150–400)
RBC: 4.32 MIL/uL (ref 4.22–5.81)
RDW: 14.4 % (ref 11.5–15.5)
WBC: 5.7 10*3/uL (ref 4.0–10.5)

## 2012-05-13 LAB — BASIC METABOLIC PANEL
CO2: 27 mEq/L (ref 19–32)
Chloride: 103 mEq/L (ref 96–112)
Creatinine, Ser: 0.75 mg/dL (ref 0.50–1.35)
GFR calc Af Amer: >90 mL/min (ref >90)
Potassium: 4 mEq/L (ref 3.5–5.1)
Sodium: 139 mEq/L (ref 135–145)

## 2012-05-13 LAB — SURGICAL PCR SCREEN
MRSA, PCR: NEGATIVE
Staphylococcus aureus: POSITIVE — AB

## 2012-05-13 NOTE — Pre-Procedure Instructions (Signed)
Chest CT   EPIC  8/13.  DOES NOT DESIRE TO HAVE TYPE AND SCREEN DRAWN TODAY.  Instructed to call office today to verify if bowel prep is needed pre op

## 2012-05-13 NOTE — Patient Instructions (Addendum)
20 RUBLE WILDEN  05/13/2012   Your procedure is scheduled on:  05/20/12  Tuesday  Surgery [id number]  Report to [facility] at  0515     AM.  Call this number if you have problems the morning of surgery: [phone]      Remember: DO NOT TAKE DIABETES MEDICINE MORNING OF SURGERY  Do not eat food( if office confirms you need bowel prep) after  midnight        as directed by office    DO NOT  drink    Any fluids :After Midnight. Monday NIGHT   NOTHING IN YOUR MOUTH AFTER MIDNIGHT   Take these medicines the morning of surgery with A SIP OF WATER: Claritin  if needed   Flonase if needed   .  Contacts, dentures or partial plates can not be worn to surgery  Leave suitcase in the car. After surgery it may be brought to your room.  For patients admitted to the hospital, checkout time is 11:00 AM day of  discharge.             SPECIAL INSTRUCTIONS- SEE [facility] PREPARING FOR SURGERY INSTRUCTION SHEET-     DO NOT WEAR JEWELRY, LOTIONS, POWDERS, OR PERFUMES.  WOMEN-- DO NOT SHAVE LEGS OR UNDERARMS FOR 12 HOURS BEFORE SHOWERS. MEN MAY SHAVE FACE.  Patients discharged the day of surgery will not be allowed to drive home. IF going home the day of surgery, you must have a driver and someone to stay with you for the first 24 hours  Name and phone number of your driver: June  Wife                                                                         Please read over the following fact sheets that you were given: MRSA Information, Incentive Spirometry Sheet, Blood Transfusion Sheet  Information                                                                                   Cory Harper  PST [phone]  [phone] 05/13/2012

## 2012-05-13 NOTE — Consult Note (Signed)
WOC ostomy consult  Stoma type/location: Patient seen per Dr. Gordy Savers request for preoperative stoma site selection.  Patient scheduled for laparoscopic ileostomy surgery on Tuesday, October15th.  Wife with, will be assisting patient in his care in the early postoperative phase.  Patient with good understanding of surgery from preop visit with Dr. Michaell Cowing and has explored some ostomy websites. Site selected after viewing patient in the sitting and standing positions and patient is able to visualize mark.  Patient is obese and there is a fairly significant abdominal fold.  Patient wears a belt in the umbilical line. Stoma site selected is 7cm above the umbilicus and 10 cm to the right.  Mark made with skin marking pen and covered with a thin film transparent dressing. Education provided: A&P reviewed, also stoma and pouch characteristics.  Questions about diet, resumption of activity and reconnection surgery answered and were appropriate. My card with contact information is provided for the patient and his wife for their use between now and their surgery date. An educational booklet is provided for their perusal.  Wife indicated that mornings will be best for teaching sessions as she will try to return to work in the afternoons if her husband is stable.  Both are amenable to [facility] assistance in the early post-op period at home.   I look forward to following this patient with you and to assisting them with [name]'s ostomy post operatively. Thanks, Ladona Mow, MSN, RN, [facility], CWOCN [phone])

## 2012-05-13 NOTE — Progress Notes (Signed)
Dr Michaell Cowing-  Do you want a consent for the second posted procedure- thyroid lobectomy.  Please place order  [place]   PST appt 05/13/12

## 2012-05-13 NOTE — Pre-Procedure Instructions (Signed)
Seen by ostomy nurse after PST visit with nursse

## 2012-05-13 NOTE — Progress Notes (Signed)
05/13/12 1003  OBSTRUCTIVE SLEEP APNEA  Have you ever been diagnosed with sleep apnea through a sleep study? No  Do you snore loudly (loud enough to be heard through closed doors)?  0  Do you often feel tired, fatigued, or sleepy during the daytime? 0  Has anyone observed you stop breathing during your sleep? 0  Do you have, or are you being treated for high blood pressure? 1  BMI more than 35 kg/m2? 1  Age over 55 years old? 1  Neck circumference greater than 40 cm/18 inches? 1  Gender: 1  Obstructive Sleep Apnea Score 5   Score 4 or greater  Results sent to PCP

## 2012-05-14 ENCOUNTER — Telehealth (INDEPENDENT_AMBULATORY_CARE_PROVIDER_SITE_OTHER): Payer: Self-pay | Admitting: General Surgery

## 2012-05-14 MED ORDER — POLYETHYLENE GLYCOL 3350 17 GM/SCOOP PO POWD: 17.0000 g | Freq: Once | ORAL | Status: DC

## 2012-05-14 MED ORDER — NEOMYCIN SULFATE 500 MG PO TABS: 500.0000 mg | ORAL_TABLET | Freq: Three times a day (TID) | ORAL | Status: AC

## 2012-05-14 MED ORDER — METRONIDAZOLE 500 MG PO TABS: 500.0000 mg | ORAL_TABLET | Freq: Three times a day (TID) | ORAL | Status: AC

## 2012-05-14 NOTE — Telephone Encounter (Signed)
Prescriptions called to [facility]. Patient aware- he requested prep instructions to be sent to his wife's fax # [phone]. Fax sent and confirmation received. He will call with any additional questions/needs.

## 2012-05-14 NOTE — Telephone Encounter (Signed)
Message copied by Liliana Cline on Wed May 14, 2012  9:05 AM ------      Message from: Ardeth Sportsman      Created: Tue May 13, 2012  1:35 PM       Yes.            Miralax full prep            PO ABx (Neomycin & Flagyl)            ----- Message -----         From: Liliana Cline, CMA         Sent: 05/13/2012  12:30 PM           To: Ardeth Sportsman, MD            Does he need a bowel prep for surgery on 05/20/12?            Thanks             [employer]

## 2012-05-20 ENCOUNTER — Encounter (HOSPITAL_COMMUNITY): Payer: Self-pay | Admitting: Anesthesiology

## 2012-05-20 ENCOUNTER — Encounter (HOSPITAL_COMMUNITY)
Admission: RE | Disposition: A | Discharge status: Home / Self Care | Payer: Self-pay | Source: Ambulatory Visit | Attending: Surgery

## 2012-05-20 ENCOUNTER — Inpatient Hospital Stay (HOSPITAL_COMMUNITY)
Admission: RE | Admit: 2012-05-20 | Discharge: 2012-05-26 | DRG: 149 | Disposition: A | Discharge status: Home / Self Care | Payer: BC Managed Care – PPO | Source: Ambulatory Visit | Attending: Surgery | Admitting: Surgery

## 2012-05-20 ENCOUNTER — Ambulatory Visit (HOSPITAL_COMMUNITY): Payer: BC Managed Care – PPO | Admitting: Anesthesiology

## 2012-05-20 ENCOUNTER — Encounter (HOSPITAL_COMMUNITY): Payer: Self-pay | Admitting: *Deleted

## 2012-05-20 DIAGNOSIS — D34 Benign neoplasm of thyroid gland: Secondary | ICD-10-CM

## 2012-05-20 DIAGNOSIS — K620 Anal polyp: Secondary | ICD-10-CM | POA: Diagnosis present

## 2012-05-20 DIAGNOSIS — I10 Essential (primary) hypertension: Secondary | ICD-10-CM | POA: Diagnosis present

## 2012-05-20 DIAGNOSIS — D126 Benign neoplasm of colon, unspecified: Secondary | ICD-10-CM | POA: Diagnosis present

## 2012-05-20 DIAGNOSIS — K621 Rectal polyp: Secondary | ICD-10-CM | POA: Diagnosis present

## 2012-05-20 DIAGNOSIS — Z6837 Body mass index (BMI) 37.0-37.9, adult: Secondary | ICD-10-CM

## 2012-05-20 DIAGNOSIS — Z91199 Patient's noncompliance with other medical treatment and regimen due to unspecified reason: Secondary | ICD-10-CM

## 2012-05-20 DIAGNOSIS — C189 Malignant neoplasm of colon, unspecified: Secondary | ICD-10-CM

## 2012-05-20 DIAGNOSIS — E669 Obesity, unspecified: Secondary | ICD-10-CM | POA: Diagnosis present

## 2012-05-20 DIAGNOSIS — Z860100 Personal history of colon polyps, unspecified: Secondary | ICD-10-CM

## 2012-05-20 DIAGNOSIS — Z9119 Patient's noncompliance with other medical treatment and regimen: Secondary | ICD-10-CM

## 2012-05-20 DIAGNOSIS — E119 Type 2 diabetes mellitus without complications: Secondary | ICD-10-CM | POA: Diagnosis present

## 2012-05-20 DIAGNOSIS — D129 Benign neoplasm of anus and anal canal: Secondary | ICD-10-CM

## 2012-05-20 DIAGNOSIS — D128 Benign neoplasm of rectum: Secondary | ICD-10-CM

## 2012-05-20 DIAGNOSIS — C180 Malignant neoplasm of cecum: Principal | ICD-10-CM | POA: Diagnosis present

## 2012-05-20 DIAGNOSIS — Z01812 Encounter for preprocedural laboratory examination: Secondary | ICD-10-CM

## 2012-05-20 HISTORY — PX: COLON RESECTION: SHX5231

## 2012-05-20 HISTORY — PX: BOWEL RESECTION: SHX1257

## 2012-05-20 HISTORY — PX: THYROID LOBECTOMY: SHX420

## 2012-05-20 HISTORY — PX: ILEOSTOMY: SHX1783

## 2012-05-20 LAB — GLUCOSE, CAPILLARY
Glucose-Capillary: 149 mg/dL — ABNORMAL HIGH (ref 70–99)
Glucose-Capillary: 269 mg/dL — ABNORMAL HIGH (ref 70–99)
Glucose-Capillary: 258 mg/dL — ABNORMAL HIGH (ref 70–99)

## 2012-05-20 LAB — TYPE AND SCREEN
ABO/RH(D): O POS
Antibody Screen: NEGATIVE

## 2012-05-20 LAB — CBC
HCT: 36.2 % — ABNORMAL LOW (ref 39.0–52.0)
MCHC: 32.3 g/dL (ref 30.0–36.0)
RDW: 14.6 % (ref 11.5–15.5)

## 2012-05-20 LAB — ABO/RH: ABO/RH(D): O POS

## 2012-05-20 LAB — CREATININE, SERUM
Creatinine, Ser: 0.69 mg/dL (ref 0.50–1.35)
GFR calc non Af Amer: >90 mL/min (ref >90)

## 2012-05-20 SURGERY — COLON RESECTION LAPAROSCOPIC
Anesthesia: General | Site: Rectum | Wound class: Clean Contaminated

## 2012-05-20 MED ORDER — CISATRACURIUM BESYLATE (PF) 10 MG/5ML IV SOLN
INTRAVENOUS | Status: DC | PRN
  Administered 2012-05-20: 4 mg via INTRAVENOUS
  Administered 2012-05-20: 20 mg via INTRAVENOUS
  Administered 2012-05-20: 10 mg via INTRAVENOUS
  Administered 2012-05-20: 4 mg via INTRAVENOUS
  Administered 2012-05-20 (×3): 10 mg via INTRAVENOUS

## 2012-05-20 MED ORDER — HEPARIN SODIUM (PORCINE) 5000 UNIT/ML IJ SOLN
5000.0000 [IU] | Freq: Three times a day (TID) | INTRAMUSCULAR | Status: DC
  Administered 2012-05-21 – 2012-05-26 (×14): 5000 [IU] via SUBCUTANEOUS
  Filled 2012-05-20 (×19): qty 1

## 2012-05-20 MED ORDER — LISINOPRIL 5 MG PO TABS
5.0000 mg | ORAL_TABLET | Freq: Every day | ORAL | Status: DC
  Administered 2012-05-20 – 2012-05-23 (×4): 5 mg via ORAL
  Filled 2012-05-20 (×5): qty 1

## 2012-05-20 MED ORDER — SUFENTANIL CITRATE 50 MCG/ML IV SOLN
INTRAVENOUS | Status: DC | PRN
  Administered 2012-05-20 (×3): 10 ug via INTRAVENOUS
  Administered 2012-05-20: 20 ug via INTRAVENOUS
  Administered 2012-05-20 (×10): 10 ug via INTRAVENOUS

## 2012-05-20 MED ORDER — MEPERIDINE HCL 50 MG/ML IJ SOLN: 6.2500 mg | INTRAMUSCULAR | Status: DC | PRN

## 2012-05-20 MED ORDER — HYDROMORPHONE HCL PF 1 MG/ML IJ SOLN
0.5000 mg | INTRAMUSCULAR | Status: DC | PRN
  Administered 2012-05-20: 1 mg via INTRAVENOUS
  Administered 2012-05-21: 2 mg via INTRAVENOUS
  Administered 2012-05-21 (×3): 1 mg via INTRAVENOUS
  Administered 2012-05-21: 2 mg via INTRAVENOUS
  Administered 2012-05-21 – 2012-05-24 (×7): 1 mg via INTRAVENOUS
  Filled 2012-05-20: qty 1
  Filled 2012-05-20: qty 2
  Filled 2012-05-20 (×4): qty 1
  Filled 2012-05-20 (×2): qty 2
  Filled 2012-05-20 (×7): qty 1

## 2012-05-20 MED ORDER — OXYCODONE HCL 5 MG PO TABS: 5.0000 mg | ORAL_TABLET | ORAL | Status: DC | PRN

## 2012-05-20 MED ORDER — ACETAMINOPHEN 500 MG PO TABS
1000.0000 mg | ORAL_TABLET | Freq: Three times a day (TID) | ORAL | Status: DC
  Administered 2012-05-20 – 2012-05-26 (×17): 1000 mg via ORAL
  Filled 2012-05-20 (×22): qty 2

## 2012-05-20 MED ORDER — LIP MEDEX EX OINT
1.0000 "application " | TOPICAL_OINTMENT | Freq: Two times a day (BID) | CUTANEOUS | Status: DC
  Administered 2012-05-20 – 2012-05-26 (×11): 1 via TOPICAL
  Filled 2012-05-20: qty 7

## 2012-05-20 MED ORDER — LIDOCAINE HCL (CARDIAC) 20 MG/ML IV SOLN
INTRAVENOUS | Status: DC | PRN
  Administered 2012-05-20: 100 mg via INTRAVENOUS

## 2012-05-20 MED ORDER — INSULIN ASPART 100 UNIT/ML ~~LOC~~ SOLN
SUBCUTANEOUS | Status: AC
  Filled 2012-05-20: qty 1

## 2012-05-20 MED ORDER — DEXTROSE 5 % IV SOLN
2.0000 g | INTRAVENOUS | Status: AC
  Administered 2012-05-20: 2 g via INTRAVENOUS
  Filled 2012-05-20: qty 2

## 2012-05-20 MED ORDER — LACTATED RINGERS IV SOLN
INTRAVENOUS | Status: DC
  Administered 2012-05-20: 1000 mL via INTRAVENOUS
  Administered 2012-05-21 – 2012-05-24 (×4): via INTRAVENOUS

## 2012-05-20 MED ORDER — MAGIC MOUTHWASH
15.0000 mL | Freq: Four times a day (QID) | ORAL | Status: DC | PRN
  Administered 2012-05-22: 15 mL via ORAL
  Filled 2012-05-20: qty 15

## 2012-05-20 MED ORDER — LOPERAMIDE HCL 2 MG PO TABS: 2.0000 mg | ORAL_TABLET | Freq: Four times a day (QID) | ORAL | Status: DC | PRN

## 2012-05-20 MED ORDER — PROMETHAZINE HCL 25 MG/ML IJ SOLN: 12.5000 mg | Freq: Four times a day (QID) | INTRAMUSCULAR | Status: DC | PRN

## 2012-05-20 MED ORDER — LACTATED RINGERS IV SOLN: INTRAVENOUS | Status: DC

## 2012-05-20 MED ORDER — BUPIVACAINE-EPINEPHRINE 0.25% -1:200000 IJ SOLN
INTRAMUSCULAR | Status: DC | PRN
  Administered 2012-05-20: 50 mL

## 2012-05-20 MED ORDER — LACTATED RINGERS IV SOLN
INTRAVENOUS | Status: DC | PRN
  Administered 2012-05-20 (×3): via INTRAVENOUS

## 2012-05-20 MED ORDER — ALVIMOPAN 12 MG PO CAPS
12.0000 mg | ORAL_CAPSULE | Freq: Two times a day (BID) | ORAL | Status: DC
  Administered 2012-05-21 – 2012-05-25 (×10): 12 mg via ORAL
  Filled 2012-05-20 (×12): qty 1

## 2012-05-20 MED ORDER — METRONIDAZOLE IN NACL 5-0.79 MG/ML-% IV SOLN
INTRAVENOUS | Status: AC
  Filled 2012-05-20: qty 100

## 2012-05-20 MED ORDER — NEOSTIGMINE METHYLSULFATE 1 MG/ML IJ SOLN
INTRAMUSCULAR | Status: DC | PRN
  Administered 2012-05-20: 4 mg via INTRAVENOUS

## 2012-05-20 MED ORDER — ALBUTEROL SULFATE (5 MG/ML) 0.5% IN NEBU: 2.5000 mg | INHALATION_SOLUTION | Freq: Four times a day (QID) | RESPIRATORY_TRACT | Status: DC | PRN

## 2012-05-20 MED ORDER — BUPIVACAINE-EPINEPHRINE 0.25% -1:200000 IJ SOLN
INTRAMUSCULAR | Status: AC
  Filled 2012-05-20: qty 1

## 2012-05-20 MED ORDER — INSULIN ASPART 100 UNIT/ML ~~LOC~~ SOLN
0.0000 [IU] | Freq: Every day | SUBCUTANEOUS | Status: DC
  Administered 2012-05-20 – 2012-05-21 (×2): 3 [IU] via SUBCUTANEOUS

## 2012-05-20 MED ORDER — LACTATED RINGERS IV SOLN
INTRAVENOUS | Status: DC | PRN
  Administered 2012-05-20 (×4): via INTRAVENOUS

## 2012-05-20 MED ORDER — INSULIN ASPART 100 UNIT/ML ~~LOC~~ SOLN
0.0000 [IU] | Freq: Three times a day (TID) | SUBCUTANEOUS | Status: DC
  Administered 2012-05-21: 8 [IU] via SUBCUTANEOUS
  Administered 2012-05-21: 5 [IU] via SUBCUTANEOUS

## 2012-05-20 MED ORDER — LACTATED RINGERS IV BOLUS (SEPSIS): 1000.0000 mL | Freq: Three times a day (TID) | INTRAVENOUS | Status: AC | PRN

## 2012-05-20 MED ORDER — INSULIN ASPART 100 UNIT/ML ~~LOC~~ SOLN
SUBCUTANEOUS | Status: DC | PRN
  Administered 2012-05-20: 5 [IU] via SUBCUTANEOUS
  Administered 2012-05-20: 8 [IU] via SUBCUTANEOUS

## 2012-05-20 MED ORDER — LORATADINE 10 MG PO TABS
10.0000 mg | ORAL_TABLET | Freq: Every day | ORAL | Status: DC
  Administered 2012-05-20 – 2012-05-26 (×7): 10 mg via ORAL
  Filled 2012-05-20 (×7): qty 1

## 2012-05-20 MED ORDER — PROPOFOL 10 MG/ML IV BOLUS
INTRAVENOUS | Status: DC | PRN
  Administered 2012-05-20: 200 mg via INTRAVENOUS

## 2012-05-20 MED ORDER — PROMETHAZINE HCL 25 MG/ML IJ SOLN: 6.2500 mg | INTRAMUSCULAR | Status: DC | PRN

## 2012-05-20 MED ORDER — DIPHENHYDRAMINE HCL 50 MG/ML IJ SOLN: 12.5000 mg | Freq: Four times a day (QID) | INTRAMUSCULAR | Status: DC | PRN

## 2012-05-20 MED ORDER — ACETAMINOPHEN 10 MG/ML IV SOLN
INTRAVENOUS | Status: AC
  Filled 2012-05-20: qty 100

## 2012-05-20 MED ORDER — ALUM & MAG HYDROXIDE-SIMETH 200-200-20 MG/5ML PO SUSP: 30.0000 mL | Freq: Four times a day (QID) | ORAL | Status: DC | PRN

## 2012-05-20 MED ORDER — HYDROMORPHONE HCL PF 1 MG/ML IJ SOLN
INTRAMUSCULAR | Status: DC | PRN
  Administered 2012-05-20 (×5): .4 mg via INTRAVENOUS

## 2012-05-20 MED ORDER — BUPIVACAINE 0.25 % ON-Q PUMP DUAL CATH 300 ML
300.0000 mL | INJECTION | Status: DC
  Filled 2012-05-20: qty 300

## 2012-05-20 MED ORDER — MIDAZOLAM HCL 5 MG/5ML IJ SOLN
INTRAMUSCULAR | Status: DC | PRN
  Administered 2012-05-20: 2 mg via INTRAVENOUS

## 2012-05-20 MED ORDER — BIOTENE DRY MOUTH MT LIQD
15.0000 mL | Freq: Two times a day (BID) | OROMUCOSAL | Status: DC
  Administered 2012-05-20 – 2012-05-23 (×7): 15 mL via OROMUCOSAL

## 2012-05-20 MED ORDER — ONDANSETRON HCL 4 MG/2ML IJ SOLN
INTRAMUSCULAR | Status: DC | PRN
  Administered 2012-05-20: 4 mg via INTRAVENOUS

## 2012-05-20 MED ORDER — 0.9 % SODIUM CHLORIDE (POUR BTL) OPTIME
TOPICAL | Status: DC | PRN
  Administered 2012-05-20: 1000 mL

## 2012-05-20 MED ORDER — LACTATED RINGERS IR SOLN
Status: DC | PRN
  Administered 2012-05-20: 3000 mL

## 2012-05-20 MED ORDER — LORAZEPAM BOLUS VIA INFUSION
0.5000 mg | Freq: Three times a day (TID) | INTRAVENOUS | Status: DC | PRN
  Filled 2012-05-20: qty 1

## 2012-05-20 MED ORDER — HEPARIN SODIUM (PORCINE) 5000 UNIT/ML IJ SOLN
5000.0000 [IU] | Freq: Once | INTRAMUSCULAR | Status: AC
  Administered 2012-05-20: 5000 [IU] via SUBCUTANEOUS
  Filled 2012-05-20: qty 1

## 2012-05-20 MED ORDER — ALVIMOPAN 12 MG PO CAPS
12.0000 mg | ORAL_CAPSULE | Freq: Once | ORAL | Status: AC
  Administered 2012-05-20: 12 mg via ORAL
  Filled 2012-05-20: qty 1

## 2012-05-20 MED ORDER — HYDROMORPHONE HCL PF 1 MG/ML IJ SOLN
INTRAMUSCULAR | Status: AC
  Filled 2012-05-20: qty 1

## 2012-05-20 MED ORDER — ACETAMINOPHEN 10 MG/ML IV SOLN
INTRAVENOUS | Status: DC | PRN
  Administered 2012-05-20: 1000 mg via INTRAVENOUS

## 2012-05-20 MED ORDER — FLUTICASONE PROPIONATE 50 MCG/ACT NA SUSP
2.0000 | Freq: Every day | NASAL | Status: DC
  Administered 2012-05-21 – 2012-05-26 (×6): 2 via NASAL
  Filled 2012-05-20: qty 16

## 2012-05-20 MED ORDER — GLYCOPYRROLATE 0.2 MG/ML IJ SOLN
INTRAMUSCULAR | Status: DC | PRN
  Administered 2012-05-20: .6 mg via INTRAVENOUS

## 2012-05-20 MED ORDER — HYDROCORTISONE ACE-PRAMOXINE 2.5-1 % RE CREA
1.0000 "application " | TOPICAL_CREAM | Freq: Four times a day (QID) | RECTAL | Status: DC | PRN
  Filled 2012-05-20: qty 30

## 2012-05-20 MED ORDER — DEXAMETHASONE SODIUM PHOSPHATE 10 MG/ML IJ SOLN
INTRAMUSCULAR | Status: DC | PRN
  Administered 2012-05-20: 10 mg via INTRAVENOUS

## 2012-05-20 MED ORDER — HYDROMORPHONE HCL PF 1 MG/ML IJ SOLN
0.2500 mg | INTRAMUSCULAR | Status: DC | PRN
  Administered 2012-05-20 (×4): 0.5 mg via INTRAVENOUS

## 2012-05-20 MED ORDER — SODIUM CHLORIDE 0.9 % IV SOLN
INTRAVENOUS | Status: AC
  Administered 2012-05-20: 10:00:00 via INTRAPERITONEAL
  Filled 2012-05-20: qty 6

## 2012-05-20 MED ORDER — METRONIDAZOLE IN NACL 5-0.79 MG/ML-% IV SOLN
500.0000 mg | Freq: Four times a day (QID) | INTRAVENOUS | Status: AC
  Administered 2012-05-20 – 2012-05-21 (×3): 500 mg via INTRAVENOUS
  Filled 2012-05-20 (×3): qty 100

## 2012-05-20 MED ORDER — METRONIDAZOLE IN NACL 5-0.79 MG/ML-% IV SOLN
500.0000 mg | INTRAVENOUS | Status: AC
  Administered 2012-05-20 (×2): 500 mg via INTRAVENOUS

## 2012-05-20 MED ORDER — GLIMEPIRIDE 2 MG PO TABS
2.0000 mg | ORAL_TABLET | Freq: Two times a day (BID) | ORAL | Status: DC
  Administered 2012-05-21 – 2012-05-26 (×11): 2 mg via ORAL
  Filled 2012-05-20 (×14): qty 1

## 2012-05-20 MED ORDER — SUCCINYLCHOLINE CHLORIDE 20 MG/ML IJ SOLN
INTRAMUSCULAR | Status: DC | PRN
  Administered 2012-05-20: 100 mg via INTRAVENOUS

## 2012-05-20 MED ORDER — METOPROLOL TARTRATE 12.5 MG HALF TABLET
12.5000 mg | ORAL_TABLET | Freq: Two times a day (BID) | ORAL | Status: DC | PRN
  Filled 2012-05-20: qty 1

## 2012-05-20 MED ORDER — KETAMINE HCL 10 MG/ML IJ SOLN
INTRAMUSCULAR | Status: DC | PRN
  Administered 2012-05-20: 5 mg via INTRAVENOUS
  Administered 2012-05-20 (×2): 10 mg via INTRAVENOUS
  Administered 2012-05-20: 30 mg via INTRAVENOUS
  Administered 2012-05-20 (×6): 10 mg via INTRAVENOUS
  Administered 2012-05-20: 5 mg via INTRAVENOUS

## 2012-05-20 MED ORDER — LACTATED RINGERS IV SOLN: INTRAVENOUS | Status: DC | PRN

## 2012-05-20 MED ORDER — BUPIVACAINE 0.5 % ON-Q PUMP DUAL CATH 300 ML
300.0000 mL | INJECTION | Status: DC
  Filled 2012-05-20: qty 300

## 2012-05-20 MED ORDER — BUPIVACAINE-EPINEPHRINE PF 0.25-1:200000 % IJ SOLN
INTRAMUSCULAR | Status: AC
  Filled 2012-05-20: qty 30

## 2012-05-20 SURGICAL SUPPLY — 124 items
APPLICATOR COTTON TIP 6IN STRL (MISCELLANEOUS) IMPLANT
APPLIER CLIP 5 13 M/L LIGAMAX5 (MISCELLANEOUS)
APPLIER CLIP ROT 10 11.4 M/L (STAPLE)
ATTRACTOMAT 16X20 MAGNETIC DRP (DRAPES) ×4 IMPLANT
BAG URINE DRAINAGE (UROLOGICAL SUPPLIES) IMPLANT
BENZOIN TINCTURE PRP APPL 2/3 (GAUZE/BANDAGES/DRESSINGS) IMPLANT
BLADE EXTENDED COATED 6.5IN (ELECTRODE) ×4 IMPLANT
BLADE HEX COATED 2.75 (ELECTRODE) ×12 IMPLANT
BLADE SURG 15 STRL LF DISP TIS (BLADE) ×3 IMPLANT
BLADE SURG 15 STRL SS (BLADE) ×1
BLADE SURG SZ10 CARB STEEL (BLADE) ×4 IMPLANT
CABLE HIGH FREQUENCY MONO STRZ (ELECTRODE) ×4 IMPLANT
CANISTER SUCTION 2500CC (MISCELLANEOUS) ×4 IMPLANT
CATH FOLEY SILVER 30CC 28FR (CATHETERS) IMPLANT
CATH ROBINSON RED A/P 20FR (CATHETERS) ×4 IMPLANT
CELLS DAT CNTRL 66122 CELL SVR (MISCELLANEOUS) IMPLANT
CHLORAPREP W/TINT 10.5 ML (MISCELLANEOUS) ×4 IMPLANT
CHLORAPREP W/TINT 26ML (MISCELLANEOUS) ×8 IMPLANT
CLIP APPLIE 5 13 M/L LIGAMAX5 (MISCELLANEOUS) IMPLANT
CLIP APPLIE ROT 10 11.4 M/L (STAPLE) IMPLANT
CLIP TI LARGE 6 (CLIP) IMPLANT
CLIP TI MEDIUM 6 (CLIP) ×12 IMPLANT
CLIP TI WIDE RED SMALL 6 (CLIP) ×8 IMPLANT
CLOTH BEACON ORANGE TIMEOUT ST (SAFETY) ×8 IMPLANT
CLSR STERI-STRIP ANTIMIC 1/2X4 (GAUZE/BANDAGES/DRESSINGS) ×4 IMPLANT
COVER MAYO STAND STRL (DRAPES) ×4 IMPLANT
DECANTER SPIKE VIAL GLASS SM (MISCELLANEOUS) ×4 IMPLANT
DISSECTOR ROUND CHERRY 3/8 STR (MISCELLANEOUS) IMPLANT
DRAIN CHANNEL RND F F (WOUND CARE) ×4 IMPLANT
DRAPE LAPAROSCOPIC ABDOMINAL (DRAPES) ×4 IMPLANT
DRAPE LG THREE QUARTER DISP (DRAPES) ×12 IMPLANT
DRAPE PED LAPAROTOMY (DRAPES) ×4 IMPLANT
DRAPE WARM FLUID 44X44 (DRAPE) ×8 IMPLANT
DRESSING SURGICEL FIBRLLR 1X2 (HEMOSTASIS) IMPLANT
DRSG PAD ABDOMINAL 8X10 ST (GAUZE/BANDAGES/DRESSINGS) IMPLANT
DRSG SURGICEL FIBRILLAR 1X2 (HEMOSTASIS)
DRSG TEGADERM 2-3/8X2-3/4 SM (GAUZE/BANDAGES/DRESSINGS) ×12 IMPLANT
DRSG TEGADERM 4X4.75 (GAUZE/BANDAGES/DRESSINGS) ×4 IMPLANT
ELECT REM PT RETURN 9FT ADLT (ELECTROSURGICAL) ×4
ELECTRODE REM PT RTRN 9FT ADLT (ELECTROSURGICAL) ×3 IMPLANT
ENDOLOOP SUT PDS II  0 18 (SUTURE) ×2
ENDOLOOP SUT PDS II 0 18 (SUTURE) ×6 IMPLANT
EVACUATOR SILICONE 100CC (DRAIN) ×4 IMPLANT
FILTER SMOKE EVAC LAPAROSHD (FILTER) IMPLANT
GAUZE SPONGE 4X4 16PLY XRAY LF (GAUZE/BANDAGES/DRESSINGS) ×4 IMPLANT
GELPOINT ADV PLATFORM (ENDOMECHANICALS)
GLOVE BIOGEL PI IND STRL 7.0 (GLOVE) ×3 IMPLANT
GLOVE BIOGEL PI INDICATOR 7.0 (GLOVE) ×1
GLOVE ECLIPSE 8.0 STRL XLNG CF (GLOVE) ×16 IMPLANT
GLOVE INDICATOR 8.0 STRL GRN (GLOVE) ×12 IMPLANT
GLOVE SURG ORTHO 8.0 STRL STRW (GLOVE) ×4 IMPLANT
GOWN PREVENTION PLUS XXLARGE (GOWN DISPOSABLE) ×8 IMPLANT
GOWN STRL NON-REIN LRG LVL3 (GOWN DISPOSABLE) IMPLANT
GOWN STRL REIN XL XLG (GOWN DISPOSABLE) ×32 IMPLANT
HAND ACTIVATED (MISCELLANEOUS) IMPLANT
KIT BASIN OR (CUSTOM PROCEDURE TRAY) ×8 IMPLANT
LEGGING LITHOTOMY PAIR STRL (DRAPES) ×4 IMPLANT
LIGASURE IMPACT 36 18CM CVD LR (INSTRUMENTS) IMPLANT
NS IRRIG 1000ML POUR BTL (IV SOLUTION) ×16 IMPLANT
PACK BASIC VI WITH GOWN DISP (CUSTOM PROCEDURE TRAY) ×4 IMPLANT
PACK GENERAL/GYN (CUSTOM PROCEDURE TRAY) ×4 IMPLANT
PEN SKIN MARKING BROAD (MISCELLANEOUS) ×4 IMPLANT
PENCIL BUTTON HOLSTER BLD 10FT (ELECTRODE) ×8 IMPLANT
PLATFORM STD W/COL CELL SVR (ENDOMECHANICALS) IMPLANT
RELOAD PROXIMATE 100 BLUE (MISCELLANEOUS) ×6 IMPLANT
RELOAD PROXIMATE 100MM BLUE (MISCELLANEOUS) ×2
RELOAD PROXIMATE 75MM BLUE (ENDOMECHANICALS) ×8 IMPLANT
RTRCTR WOUND ALEXIS 18CM MED (MISCELLANEOUS)
SCISSORS LAP 5X35 DISP (ENDOMECHANICALS) ×4 IMPLANT
SEALER TISSUE G2 CVD JAW 35 (ENDOMECHANICALS) IMPLANT
SEALER TISSUE G2 CVD JAW 45CM (ENDOMECHANICALS)
SET IRRIG TUBING LAPAROSCOPIC (IRRIGATION / IRRIGATOR) ×4 IMPLANT
SHEARS HARMONIC 9CM CVD (BLADE) ×4 IMPLANT
SLEEVE SURGEON STRL (DRAPES) ×20 IMPLANT
SLEEVE XCEL OPT CAN 5 100 (ENDOMECHANICALS) ×4 IMPLANT
SLEEVE Z-THREAD 5X100MM (TROCAR) ×4 IMPLANT
SPONGE GAUZE 4X4 12PLY (GAUZE/BANDAGES/DRESSINGS) IMPLANT
SPONGE LAP 18X18 X RAY DECT (DISPOSABLE) ×12 IMPLANT
STAPLER CIRC CVD 29MM 37CM (STAPLE) ×4 IMPLANT
STAPLER CUT CVD 40MM BLUE (STAPLE) ×4 IMPLANT
STAPLER CUT RELOAD BLUE (STAPLE) ×4 IMPLANT
STAPLER PROXIMATE 100MM BLUE (MISCELLANEOUS) ×4 IMPLANT
STAPLER PROXIMATE 75MM BLUE (STAPLE) ×4 IMPLANT
STAPLER VISISTAT 35W (STAPLE) ×4 IMPLANT
STRIP CLOSURE SKIN 1/2X4 (GAUZE/BANDAGES/DRESSINGS) ×4 IMPLANT
SUCTION POOLE TIP (SUCTIONS) ×4 IMPLANT
SUT ETHILON 2 0 PS N (SUTURE) ×4 IMPLANT
SUT MNCRL AB 4-0 PS2 18 (SUTURE) ×8 IMPLANT
SUT PDS AB 1 CTX 36 (SUTURE) ×8 IMPLANT
SUT PDS AB 1 TP1 96 (SUTURE) IMPLANT
SUT PROLENE 0 CT 2 (SUTURE) ×4 IMPLANT
SUT PROLENE 2 0 CT2 30 (SUTURE) IMPLANT
SUT PROLENE 2 0 KS (SUTURE) IMPLANT
SUT SILK 2 0 (SUTURE) ×2
SUT SILK 2 0 SH (SUTURE) ×4 IMPLANT
SUT SILK 2 0 SH CR/8 (SUTURE) ×8 IMPLANT
SUT SILK 2 0SH CR/8 30 (SUTURE) IMPLANT
SUT SILK 2-0 18XBRD TIE 12 (SUTURE) ×6 IMPLANT
SUT SILK 2-0 30XBRD TIE 12 (SUTURE) IMPLANT
SUT SILK 3 0 (SUTURE) ×1
SUT SILK 3 0 SH CR/8 (SUTURE) IMPLANT
SUT SILK 3-0 18XBRD TIE 12 (SUTURE) ×3 IMPLANT
SUT VIC AB 2-0 SH 18 (SUTURE) ×4 IMPLANT
SUT VIC AB 3-0 SH 18 (SUTURE) ×12 IMPLANT
SUT VICRYL 2 0 18  UND BR (SUTURE)
SUT VICRYL 2 0 18 UND BR (SUTURE) IMPLANT
SYR 30ML LL (SYRINGE) IMPLANT
SYR BULB IRRIGATION 50ML (SYRINGE) ×8 IMPLANT
SYRINGE IRR TOOMEY STRL 70CC (SYRINGE) IMPLANT
SYS LAPSCP GELPORT 120MM (MISCELLANEOUS) ×4
SYSTEM LAPSCP GELPORT 120MM (MISCELLANEOUS) ×3 IMPLANT
TOWEL OR 17X26 10 PK STRL BLUE (TOWEL DISPOSABLE) ×16 IMPLANT
TRAY FOLEY CATH 14FRSI W/METER (CATHETERS) ×4 IMPLANT
TRAY LAP CHOLE (CUSTOM PROCEDURE TRAY) ×4 IMPLANT
TROCAR XCEL BLADELESS 5X75MML (TROCAR) ×4 IMPLANT
TROCAR Z-THREAD FIOS 11X100 BL (TROCAR) IMPLANT
TROCAR Z-THREAD FIOS 12X100MM (TROCAR) IMPLANT
TROCAR Z-THREAD FIOS 5X100MM (TROCAR) ×4 IMPLANT
TROCAR Z-THREAD SLEEVE 11X100 (TROCAR) IMPLANT
TUBING FILTER THERMOFLATOR (ELECTROSURGICAL) ×4 IMPLANT
TUNNELER SHEATH ON-Q 16GX12 DP (PAIN MANAGEMENT) IMPLANT
WATER STERILE IRR 1500ML POUR (IV SOLUTION) ×4 IMPLANT
YANKAUER SUCT BULB TIP 10FT TU (MISCELLANEOUS) ×8 IMPLANT
YANKAUER SUCT BULB TIP NO VENT (SUCTIONS) ×4 IMPLANT

## 2012-05-20 NOTE — Anesthesia Preprocedure Evaluation (Addendum)
Anesthesia Evaluation  Patient identified by MRN, date of birth, ID band Patient awake    Reviewed: Allergy & Precautions, H&P , NPO status , Patient's Chart, lab work & pertinent test results  Airway Mallampati: II TM Distance: >3 FB Neck ROM: Full    Dental No notable dental hx.    Pulmonary neg pulmonary ROS,  breath sounds clear to auscultation  Pulmonary exam normal       Cardiovascular hypertension, Pt. on medications negative cardio ROS  Rhythm:Regular Rate:Normal     Neuro/Psych negative neurological ROS  negative psych ROS   GI/Hepatic negative GI ROS, Neg liver ROS,   Endo/Other  negative endocrine ROSdiabetes, Oral Hypoglycemic Agents  Renal/GU negative Renal ROS  negative genitourinary   Musculoskeletal negative musculoskeletal ROS (+)   Abdominal   Peds negative pediatric ROS (+)  Hematology negative hematology ROS (+)   Anesthesia Other Findings   Reproductive/Obstetrics negative OB ROS                          Anesthesia Physical Anesthesia Plan  ASA: II  Anesthesia Plan: General   Post-op Pain Management:    Induction: Intravenous  Airway Management Planned: Oral ETT  Additional Equipment:   Intra-op Plan:   Post-operative Plan: Extubation in OR  Informed Consent: I have reviewed the patients History and Physical, chart, labs and discussed the procedure including the risks, benefits and alternatives for the proposed anesthesia with the patient or authorized representative who has indicated his/her understanding and acceptance.   Dental advisory given  Plan Discussed with: CRNA  Anesthesia Plan Comments:         Anesthesia Quick Evaluation

## 2012-05-20 NOTE — Anesthesia Procedure Notes (Signed)
Procedure Name: Intubation Date/Time: 05/20/2012 8:17 AM Performed by: Leroy Libman L Patient Re-evaluated:Patient Re-evaluated prior to inductionOxygen Delivery Method: Circle system utilized Preoxygenation: Pre-oxygenation with 100% oxygen Intubation Type: IV induction Ventilation: Mask ventilation without difficulty and Oral airway inserted - appropriate to patient size Laryngoscope Size: Miller and 3 Grade View: Grade II Tube type: Reinforced Tube size: 8.0 mm Number of attempts: 1 Airway Equipment and Method: Stylet Placement Confirmation: ETT inserted through vocal cords under direct vision,  breath sounds checked- equal and bilateral and positive ETCO2 Secured at: 22 cm Tube secured with: Tape Dental Injury: Teeth and Oropharynx as per pre-operative assessment

## 2012-05-20 NOTE — Anesthesia Postprocedure Evaluation (Signed)
  Anesthesia Post-op Note  Patient: Cory Harper  Procedure(s) Performed: Procedure(s) (LRB): COLON RESECTION LAPAROSCOPIC (N/A) LOW ANTERIOR BOWEL RESECTION (Left) THYROID LOBECTOMY (Left)  Patient Location: PACU  Anesthesia Type: General  Level of Consciousness: awake and alert   Airway and Oxygen Therapy: Patient Spontanous Breathing  Post-op Pain: mild  Post-op Assessment: Post-op Vital signs reviewed, Patient's Cardiovascular Status Stable, Respiratory Function Stable, Patent Airway and No signs of Nausea or vomiting  Post-op Vital Signs: stable  Complications: No apparent anesthesia complications

## 2012-05-20 NOTE — Op Note (Signed)
Cory Harper, Cory Harper NO.:  [id number]  MEDICAL RECORD NO.:  [id number]  LOCATION:  1229                         FACILITY:  [facility]  PHYSICIAN:  Ardeth Sportsman, MD     DATE OF BIRTH:  01-13-1956  DATE OF PROCEDURE:  05/20/2012 DATE OF DISCHARGE:                              OPERATIVE REPORT   PRIMARY CARE PHYSICIAN:  Dwana Curd. Para March, M.D.  GASTROENTEROLOGIST:  Rachael Fee, MD and Venita Lick. Russella Dar, MD, Clementeen Graham.  SURGEON:  Ardeth Sportsman, MD, FACS.  ASSISTANT:  Alicia C. Maisie Fus, MD.  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 3. Left inferior thyroid lobe nodule (biopsy consistent with     follicular lesion). 4. Anal canal polyp.  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 3. Left inferior thyroid lobe nodule (biopsy consistent with     follicular lesion). 4. Anal canal polyp.  PROCEDURE PERFORMED: 1. Laparoscopically-assisted total proctocolectomy. 2. Ileal pouch - anal anastomosis. 3. Diverting loop ileostomy. 4. Excision of anal canal polyp. 5. Left thyroid lobectomy.  ANESTHESIA: 1. General anesthesia. 2. Local anesthetic in a field block on all port sites. 3. On-Q continuous bupivacaine pain pump in the preperitoneal plane.  DRAINS:  A 19-French Blake drain rest in the true pelvis, comes out of left lower quadrant port site.  ESTIMATED BLOOD LOSS:  100 mL.  COMPLICATIONS:  None apparent.  SPECIMENS: 1. Abdominal colon (cecum to proximal rectum). 2. Remaining rectal mucosa (mid to distal rectum) stitch in the     proximal end. 3. Anal canal polyp in the right anterior lateral anal canal around 2     o'clock in lithotomy position, 2 cm from the anal verge. 4. Anastomotic rings.  The glue stitches in the ileal pouch, ring     without stitch is the new distal rectal margin and finally left     thyroid lobe, stitch in left superior pole.  INDICATIONS:  Mr. Dombrowski is a  56 year old morbidly obese male, diagnosed with familial polyposis.  He has declined surgery for some time until recently he finally was diagnosed with a cecal cancer.  He was sent for surgical referral.  Discussion with Dr. [name], colorectal surgeon in the group.  We made recommendation for total proctocolectomy. He is interested in ileal pouch reconstruction to minimize the chance of permanent ileostomy.  TECHNIQUE:  Risks, benefits, and alternatives were discussed.  Questions answered, and he agreed to proceed.  He was also found to have a left thyroid nodule on the chest CT after diagnosis of cancer.  Ultrasound confirmed the nodule.  Biopsy showed a follicular lesion.  It is 2.2 cm in size.  Because of his history of noncompliance and not proceeding with surgery, his primary care physician, and his gastroenterologist wished to have surgery done concurrently.  I noted increased risk of wound infection with this, but I recommend consideration surgery.  The patient wished to have it done at the same time as well.  Technique of removal was discussed.  Risks, benefits, and alternatives discussed.  Questions answered, and he agreed to proceed.  OPERATIVE FINDINGS:  He had an obvious bulky lesion on  the mesentery side of his cecum probably at least 5 cm in size.  Rest of the colon had polyps, but no obvious large cancer within it.  He has a 29 EEA-stapled anastomosis of a J-shaped ileal pouch x15 cm. It is 1.5 cm from the anal verge just above the sphincters.  He has a diverting loop ileostomy.  It is in the proximal ileum.  He had an 8-mm pedunculated polyp in the left anterior anal canal, hopefully benign.  He had a soft 2-cm nodule in the inferior pole of his left thyroid lobe. No obvious abnormalities felt on the right lobe.  DESCRIPTION OF PROCEDURE:  Informed consent was confirmed.  The patient underwent general anesthesia without any difficulty.  He was  positioned in low lithotomy with arms tucked.  He had a Foley catheter placed.  He was placed on Alvimopan preoperatively for the anti-ileus protocol.  He received preoperative heparin.  He received IV Rocephin and Flagyl. He had already had a mechanical and oral antibiotic bowel prep.  His SCD compression devices were active during the entire case.  Surgical time-out confirmed our plan.  I placed a 5-mm port in the left upper abdomen using optical entry technique with the patient steep reverse Trendelenburg and left side up. Entry was clean.  I induced carbon dioxide insufflation.  I placed 5-mm ports in bilateral lower quadrants and through the superior part of the umbilicus.  We placed another port in the right upper quadrant paramedian region at the site of the premarked ileostomy site. Actually, the right lower quadrant port was more in the right suprapubic region.  We focused attention towards the cecum in the right colon since that is where the obvious cancer was.  We allowed the small bowel to fall towards the pelvis and left abdomen.  We elevated the cecum and could locate the ileocolic vascular pedicle.  I scored the mesentery and got into the retromesenteric plane.  We elevated the proximal colon mesentery off the Retroperitoneum - off the right kidney, duodenal sweep, pancreatic head, etc.  It was an avascular plane, and could mainly do this using blunt dissection along with some focused bipolar EnSeal system. We followed that laterally towards the right paracolic gutter up towards the hepatic flexure, then the right to the mid transverse colon as well.  We followed the ileal colic pedicle all the way down to its base.  I skeletonized the peritoneum around it and transected that using the bipolar system.  Hemostasis excellent.  We followed that more proximally and ligated the right colic as well.  We came up to the pancreatic neck and elevated the transverse mesocolon to  ligate the right branch of the middle colic artery.  We mobilized the right colon in a lateral to medial fashion.  We mobilized the cecum off its attachments off the pelvic sidewall and pericolic gutter and followed that up towards the hepatic flexure.  Came around and freed that up to the proximal transverse colon.  We then moved over towards the patient's right side and began to free the greater omentum off the transverse colon, all the way to the splenic flexure, first in the avascular plane and then got into the deeper plane and in the lesser sac and got the deeper fold off as well.  With that, we could see the middle colic artery pedicle rather well.  We decided to proceed with left colon mobilization.  We positioned more head down.  Elevated the sigmoid colon, and  found the inferior mesenteric artery IMA pedicle.  I scored the peritoneum from the base of that towards the right peritoneal reflection of the right rectum.  I elevated the pedicle anteriorly.  Initially, I think we are getting in the sigmoid mesentery and we came a little more deeper, elevated to find the left ureter and kept that posterior in the retroperitoneal region. His inferior mesenteric artery was little more tight, but after seeing the left ureter, we skeletonized and ligated the inferior mesenteric artery using the bipolar EnSeal system.  I also ligated it with a 0 PDS endo-loop to good result.  We followed the mesentery more proximally until we got to the inferior mesenteric vein and isolated and ligated with the bipolar system as well.  With that, we elevated that in a medial lateral fashion and elevated from the splenic flexure all the way down to the sacral promontory into the mesorectum.  The presacral plane was rather inflamed and stuck, so I could get into that as easily.  We decided to hold off on that and focus on left colon mobilization.    With good medial lateral mobilization and freeing off the  colon off the left kidney and ureter and other retroperitoneal structures, we decided to mobilize the left colon in lateral to medial fashion.  I took the redundant sigmoid colon with the adhesions to the left anterior abdominal wall.  I freed those off and then able to free off the attachments to the white line of Toldt along the left pericolic gutter up towards the splenic flexure. Then, we were able to come around and mobilized the splenic flexure completely, and find the inferior pancreatic ridge and freed the distal and mid transverse colon off its attachments off the inferior pancreatic ridge. With that, we could see and isolate the transverse mesocolon.  We began to gradually ligate that after elevating ensuring the only thing elevated was the transverse colon we took the left middle colic and right middle colic branches with EnSeal bipolar.  We took care to isolate and see that, and with that we had complete mobilization of the entire colon and entire ligation of all the major pedicles.  We tried to focus on a good high ligation, but avoid getting too close to the superior mesenteric artery in the midline.  Next, we began with pelvic dissection.  We tried to mobilize the rectum, but the remaining colon was little bit bulky and hard to keep out of the way.  We therefore decided to transect that.  We placed a GelPort through a Pfannenstiel incision through an 8-cm transverse incision.  We found the rectum and elevated the mesorectum and create a window in the mesorectum at the junction between the proximal and mid rectum.  I used a contour stapler and transected off of that.  We transected the ileum off the cecum, we took a 2 cm very distal ileal margin since it was a cecal cancer.  With that, we were able to remove the abdominal colon and proximal rectum out.  That allowed [place] to see the pelvis better.  We returned to dissection.  We primarily did it laparoscopically by getting  elevated and freeing the mesorectum off its attachments on the presacral plane.  They were somewhat sticking and inflamed, but gradually freed those off, and I will follow that.  We came around anteriorly and freed the peritoneal reflection on the left side of the left rectum down to the peritoneal flexion.  His peritoneal  reflexion was quite low.  Eventually, he came around anteriorly as well.  We focussed on dissection posteriorly then off the lateral stalks, then anteriorly.  Gradually, it took time, we were able to mark it out and get below the mesorectum down to the pelvic floor posteriorly.  I had used a fair amount of bipolar system with the EnSeal, but eventually freed all that off.  We used our hand to make sure we were feeling as well as rectal examination until we were certain that we were at the level of the levators, and had freed off completely.  We stapled off the remaining rectum using a contour stapler, flush at the level of the pelvic floor to good result.  We assured hemostasis.  We focussed on construction of the ileal pouch.  The terminal ileum was viable.  We selected an area about 20 cm proximally to the end that seemed to reach the most down towards the pelvis.  We were concerned we may not fully reach down, so we ended up doing some peritoneal releases on the terminal ileal Mesentery.  They were superficial arcs about every 8 cm following down initially more towards the outer rim and then coming more proximally until we got to more the base of the pedicle.  We did that on both sides.  I gave [place] a few more cm of stretch into the pelvis.  We went back  laparoscopically.  I ended up mobilizing the whole small bowel pedicle by mobilizing the peritoneum at the base of its takeoff and just freeing that off and then stretching out the whole small bowel mesentery from the SMA pedicle down especially the ileal side that allowed [place] and gave several more cm as well.  With  that, it seemed like the pouch would better reach down.  We eviscerated.  We set up the pouch by using firings of 100 linear GIA staplers x3 on the anti-mesenteric side to create a J-pouch x15 cm in length.  We imbricated the staple line at the distal end of the terminal ileum, part of the pouch and then placed a few silk stitches also on the staple line to help reinforce it.  We closed the common staple defect using a #1 Prolene around the 29 EEA anvil.  I scrubbed down into the pelvis and Dr. Maisie Fus remained above she was able to place the pouch down easily.  She made sure that the ileal mesentery laid straight and there was no small bowel trapped within it. The mesentery was primarily posterior and the serosa  nterior as it came down.  After some gentle rectal dilation, I could feel the remaining anal canal polyp. It was pedunculated.  I elevated and transected that off from its base sharply.  I brought the EEA stapler up into the short rectal stump carefully, just above the sphincters to the very, very short remaining stump.  I brought the spike out just posterior to the staple line and under direct visualization.  We attached the anvil of the ileal pouch to the spike of the stapler.  I brought it down and clamped.  She made sure that the mesentery was not twisted or turned.  I agreed.  I held the clamp for 60 seconds.  I fired, held the fired for 30 seconds and released.  I got 2 excellent anastomotic rings, and sent those off.  I did digital exam and I could feel the staple line 1.5 cm from the anal verge just above  the Sphincters, consistent with an excellent staple line for a good distal anastomosis.  There was no bleeding.  I scrubbed back above.  We chose an area of ileum to help bring up the loop ileostomy.  Unfortunately, had to go about a foot and a half more proximally than we initially anticipated because the distal ileum would not reach up since I had to go for the  pouch.  Also he was morbidly obese, in the supraumbilical location provided the challenge, but eventually, we were able to bring a loop up after creating a defect through the right supraumbilical paramedian defect and opened the fascia in a cruciate fashion.  We worked hard to avoid over dilating it.  We end up putting a red Robinson catheter as a bridge on the mesentery of the loop to help keep it from falling back in.  We did copious irrigation, inspection, and ensured hemostasis.  I did a final irrigation of antibiotic soaked irrigation (clindamycin/gentamicin).  We will allowed that soak for several minutes.  We laparoscopically placed the drain as noted above and went down the pelvis.  We evacuated carbon dioxide and removed the ports.  I closed the Pfannenstiel incision.  Using 0 Vicryl on the posterior rectal fascia to help bring the muscle towards midline since he had about 4-cm diastasis recti down there.  That helped closed the posterior rectus transversely.  We then closed the anterior rectus fascia transversely using #1 running PDS to good result, and we then closed the skin using interrupted running 4-0 Monocryl stitches.  We irrigated with antibiotic-soaked solution with each layer of closure, and I did do Betadine-soaked umbilical tapes as wicks in the corners and the center of the wound.  I closed the rest of the port sites using 4-0 Monocryl stitch, and the drain was secured using 2-0 silk stitch.  Sterile dressings applied.  Neck, we created the ileostomy.  We opened up the bowel transversely, leaving a short and distal ileum opening and then everting a larger proximal loop up to have more of a button brooked up rosebud shape.  We did that with some interrupted Vicryl stitches around the periphery.  We trimmed the Red Robinson and folded upon itself, and put some silk stitches so that it created a short ring above the loop ileostomy, but still acted as a bolster.  I  placed an ileostomy bag.  Next, the patient was repositioned supine with arms tucked.  We re- scrubbed and re-prepped and draped.  Then, I focused on the left thyroidectomy.  We measured all the area and created a curvilinear incision in a natural collar fold about a fingerbreadth above the clavicles.  I split the platysma transversely.  We created subplatysmal planes using blunt and focussed cautery dissection.  We placed a Mahorner retractor to help keep the wound open.  We split through the midline of the strap muscles on the sternothyroid and sternohyoid until we got to the isthmus of the thyroid.  I focused on the left side.  I was able to free the strap muscles off the anterior thyroid on the left side, using a focused blunt dissection as well as a little bit of cautery.  Once came around more laterally, I switched over to Harmonic only.  I was able to feel the obvious nodule in the left inferior thyroid lobe.  I was able to elevate much of the thyroid as the left thyroid lobe was rather small.  I was able to mobilize up  and began to free the strap muscles laterally off the superior pole.  I came around in a lateral to medial fashion, eventually isolated and saw the vascular pedicle going to the superior thyroid.  We carefully skeletonized those and ligated those, close to the thyroid using clips on the neck side and then Harmonic Scalpel flushed with the thyroid.  I saw an excellent candidate for a mildly enlarged parathyroids up in this region and kept that away on the neck side and stay close to the thyroid and used Harmonic dissection.  Gradually it came around that.  I then freed the medial from the superior thyroid lobe off its attachments to the thyroid cartilage and trachea, and freed that off primarily bluntly as well as some focused Harmonic dissection.  No parameter lobe of significance.  With that, I am better mobilize the superior thyroid lobe, and help elevated that  up.  I focused on the inferior thyroid lobe, I can bring that up and stay close it freed off the posterior part of the inferior thyroid lobe using careful blunt dissection and ultrasonic dissection.  I saw a good candidate for the inferior parathyroid lobe and freed that off its attachments to the inferior thyroid lobe and peeled that off carefully. Eventually, he came down more deeply.  I saw an excellent candidate for the laryngeal nerve on the left side.  I kept that posterior away. Eventually, skeletonized and freed the attachments as I came up around the trachea and followed that up towards its junction more the thyroid cartilage and came to the ligament of Berry.  I carefully freed that off using Harmonic Scalpel, and with that, released the entire left thyroid lobe off to the midline.  Of note, he did not really have that dominant middle thyroid vein that I can see and I was able to control that well with just a single clip and harmonic.  I measured and peeled the isthmus off all the way over towards its junction with the right lobe.  I used a clamp and clamped that off and used Harmonic Scalpel transected the isthmus with it.  I then suture ligatured the medial right thyroid lobe using a 2-0 silk surgical ligature to good result.  I did just briefly feel the right thyroid lobe and felt no abnormalities or lesions.  I had avoided dissecting that region since there was no abnormality in the left thyroid inferior lobe seen, benign, so I did not proceed with total thyroidectomy.  I did careful irrigation in the neck and hemostasis was excellent.  We placed fibular type material for hemostasis.  I reapproximated the strap muscles along the midline using interrupted Vicryl stitches.  I reapproximated the platysmas transversely using interrupted Vicryl stitches.  I closed the skin using a running 4-0 Monocryl stitch and Steri-Strips.  Sterile dressings applied.  The patient is being  extubated, and going to recovery room.  The ileostomy is viable.  I discussed postop care in detail with the patient.  I am about to discuss it with the family as well.     Ardeth Sportsman, MD     SCG/MEDQ  D:  05/20/2012  T:  05/20/2012  Job:  [id number]  cc:   Dwana Curd. Para March, M.D. Fax: [phone]  Rachael Fee, MD [address] [place], [place] [address]  Venita Lick. Russella Dar, MD, FACG [address] [place] [address]  Vanita Panda, MD [facility]

## 2012-05-20 NOTE — Preoperative (Signed)
Beta Blockers   Reason not to administer Beta Blockers:Not Applicable 

## 2012-05-20 NOTE — Transfer of Care (Signed)
Immediate Anesthesia Transfer of Care Note  Patient: Cory Harper  Procedure(s) Performed: Procedure(s) (LRB) with comments: COLON RESECTION LAPAROSCOPIC (N/A) - Laparoscopic Proctocolectomy, Ileal Pouch Anal Anastomoisis, Loop Ileostomy LOW ANTERIOR BOWEL RESECTION (Left) THYROID LOBECTOMY (Left) - LEFT THYROID LOBECTOMY  Patient Location: PACU  Anesthesia Type: General  Level of Consciousness: awake, alert  and oriented  Airway & Oxygen Therapy: Patient Spontanous Breathing and Patient connected to face mask oxygen  Post-op Assessment: Report given to PACU RN and Post -op Vital signs reviewed and stable  Post vital signs: Reviewed and stable  Complications: No apparent anesthesia complications

## 2012-05-20 NOTE — H&P (Signed)
Cory Harper   03-01-1956 [id number]   Patient Care Team: [name], MD as PCP - General (Family Medicine) Rachael Fee, MD as Consulting Physician (Gastroenterology) [name], MD as Consulting Physician (General Surgery)   This patient is a 56 y.o.male who presents today for surgical evaluation at the request of Dr. Christella Hartigan.    Reason for evaluation: Familial adenomatous polyposis.  Very suspicious cecal lesion for cancer.  Consideration of surgery.  Thyroid mass   Patient is a pleasant obese bus driver.  Found to have 100s of polyps on a colonoscopy over a decade ago.  Was recommended by Dr. Russella Dar with the [facility] to consider surgery.  He held off.  Was recommended to see a surgeon again in a few years later.  He held off.  Switched gastroenterologists since Dr. Christella Hartigan took care of his wife.  Colonoscope was done.  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  CT scan very suspicious for cancer.  He was sent to our group for surgical evaluation.  Due to insurance issues, out new colorectal surgeon, Dr. Maisie Fus, was not immediately available.  I fit him into clinic today.  He comes today with his wife and sister.  He wished to have them involved in discussion. His sister had a colonoscopy a few years ago that was completely clear.  Due for colonoscopy five year followup in 2014.  There are mother had an issue with emergency surgery and colostomy.  Struggled with an abscess and a lot of ostomy problems.  I think this is much of the reason why he has been hesitant to consider surgery   Patient has a bowel movement 1-2 a day.  Denies much in the way of rectal bleeding.  He claims he can walk a mile without much problems.  No inflammatory bowel disease, irritable bowel syndrome, allergy such as Celiac Sprue, dietary/dairy problems, colitis, ulcers nor gastritis.  No recent sick contacts/gastroenteritis.  No travel outside the country.  No changes in diet.  He was found to have a  thyroid nodule on a CT of the chest/abdomen/pelvis.  Ultrasound confirmed a Solid left inferior pole thyroid nodule measuring 22 x 20 x 18 mm..  Biopsy done.  Consistent with follicular lesion.      Patient Active Problem List   Diagnosis   .  DIABETES MELLITUS, TYPE II   .  OBESITY   .  HYPERTENSION, BENIGN ESSENTIAL   .  ECZEMA   .  COLONIC POLYPS, HX OF   .  VITAMIN B12 DEFICIENCY   .  UNSPECIFIED VITAMIN D DEFICIENCY   .  Routine general medical examination at a health care facility   .  Pure hypercholesterolemia         Past Medical History   Diagnosis  Date   .  Diabetes mellitus     .  Colon polyps         he had declined f/u colonoscopy after inital colonoscopy   .  Hypertension     .  Arthritis     .  Nasal congestion     .  Colonic mass           Past Surgical History   Procedure  Date   .  Knee arthroscopy  1998       Right knee         History       Social History   .  Marital Status:  Married  Spouse Name:  N/A       Number of Children:  0   .  Years of Education:  N/A       Occupational History   .  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  Smoking status:  Former Smoker       Quit date:  08/07/1991   .  Smokeless tobacco:  Never Used     Comment: quit in 1993   .  Alcohol Use:  No   .  Drug Use:  No   .  Sexually Active:  Not on file       Other Topics  Concern   .  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  Uterine cancer  Mother     .  Hypertension  Mother     .  Stroke  Mother     .  Colon cancer  Mother     .  Colon polyps  Mother     .  Cancer  Mother         colon, endometrial   .  Diabetes  Father     .  Obesity  Father     .  Pneumonia  Father       .  Cancer  Father         skin - squamous    .  Diabetes  Paternal Grandmother     .  Prostate cancer  Neg Hx           Current Outpatient Prescriptions   Medication  Sig  Dispense  Refill   .  cholecalciferol (VITAMIN D) 1000 UNITS tablet  Take 1,000 Units by mouth daily.         .  clotrimazole-betamethasone (LOTRISONE) cream  Apply topically 2 (two) times daily.           .  CRESTOR 10 MG tablet  take 1 tablet by mouth at bedtime   30 tablet   11   .  fluticasone (FLONASE) 50 MCG/ACT nasal spray  Place 2 sprays into the nose daily.   16 g   12   .  glimepiride (AMARYL) 2 MG tablet  Take 1 tablet (2 mg total) by mouth 2 (two) times daily.   60 tablet   12   .  glucose blood (ACCU-CHEK AVIVA PLUS) test strip  1 each by Other route 2 (two) times daily. Use as instructed   100 each   9   .  lisinopril (PRINIVIL,ZESTRIL) 5 MG tablet  Take 1 tablet (5 mg total) by mouth daily.   30 tablet   12   .  metFORMIN (GLUCOPHAGE) 1000 MG tablet  Take 1 tablet (1,000 mg total) by mouth 2 (two) times daily with a meal.   60 tablet   12   .  saxagliptin HCl (ONGLYZA) 5 MG TABS tablet  Take 5 mg by mouth daily.          .  vitamin B-12 (CYANOCOBALAMIN) 1000 MCG tablet  Take 1,000 mcg by  mouth daily.               Allergies   Allergen  Reactions   .  Ibuprofen         Joint swelling        BP 134/78  Pulse 70  Temp 97.6 F (36.4 C) (Temporal)  Resp 16  Ht 5\' 11"  (1.803 m)  Wt 274 lb 4 oz (124.399 kg)  BMI 38.25 kg/m2   Ct Chest W Contrast   03/28/2012  **ADDENDUM** CREATED: 03/28/2012 15:56:28  Addendum for clarification:  As noted in the CT chest findings/impression, there is a 2.0 cm left thyroid nodule, unrelated to the presenting complaint. However, if not previously characterized, a thyroid ultrasound is suggested for further evaluation.  **END ADDENDUM** SIGNED BY: Charline Bills, M.D.    03/28/2012  *RADIOLOGY REPORT*  Clinical Data:  Heme positive stool, recent colonoscopy  with two suspicious polyps.  CT CHEST, ABDOMEN AND PELVIS WITH CONTRAST  Technique:  Multidetector CT imaging of the chest, abdomen and pelvis was performed following the standard protocol during bolus administration of intravenous contrast.  Contrast: OMNIPAQUE IOHEXOL 300 MG/ML  SOLN  Comparison:  None.  CT CHEST  Findings:  Lungs are clear.  No suspicious pulmonary nodules.  No pleural effusion or pneumothorax.  The visualized thyroid is notable for a 2.0 cm left thyroid nodule.  The heart is normal in size.  No pericardial effusion.  No suspicious mediastinal, hilar, or axillary lymphadenopathy.  Degenerative changes of the thoracic spine.  IMPRESSION: No evidence of metastatic disease in the chest.  2.0 cm left thyroid nodule.  CT ABDOMEN AND PELVIS  Findings:  Liver, spleen, and pancreas are within normal limits.  Low density bilateral adrenal nodules, favored to reflect adrenal adenomas, although strictly indeterminate. Right adrenal nodule measures 3.1 x 1.9 cm. Dominant left adrenal nodule measures 1.7 x 1.3 cm.  Multiple left renal sinus cysts.  Right kidney is unremarkable.  No hydronephrosis.  No evidence of bowel obstruction.  Normal appendix.  Eccentric wall thickening involving the ascending colon (series 3/image 60; coronal image 63), suspicious for primary colonic adenocarcinoma.  Associated small ileocolic lymph nodes measuring up to 8 mm short axis (series 3/image 53).  Additional small retroperitoneal/pelvic lymph nodes which do not meet pathologic CT size criteria.  No evidence of abdominal aortic aneurysm.  No abdominopelvic ascites.  Mild prostatomegaly, measuring 5.4 cm in transverse dimension.  Bladder is mildly thick-walled although underdistended.  Degenerative changes of the lumbar spine.  Multiple probable bone islands in the bilateral pelvis.  IMPRESSION: Eccentric wall thickening involving the ascending colon, suspicious for primary colonic adenocarcinoma.  Associated small  ileocolic lymph nodes measuring up to 8 mm short axis.  Otherwise, no evidence of metastatic disease in the abdomen/pelvis.  Original Report Authenticated By: Charline Bills, M.D.     US Soft Tissue Head/neck   04/04/2012  *RADIOLOGY REPORT*  Clinical Data: Thyroid nodule.  THYROID ULTRASOUND  Technique: Ultrasound examination of the thyroid gland and adjacent soft tissues was performed.  Comparison:  Chest CT 03/28/2012.  Findings:  Right thyroid lobe:  41 mm x 15 mm x 18 mm.  3 mm hypoechoic minuscule nodule in the inferior pole.  Mildly heterogeneous echotexture. Left thyroid lobe:  45 mm x 21 mm x 22 mm.  Mildly heterogeneous echotexture. Isthmus:  6 mm.  Focal nodules:  22 mm x 20 mm x 18 mm solid nodule in the inferior left thyroid lobe.  Internal  vascular flow is present.  This meets criteria for thyroid biopsy.  Lymphadenopathy:  There is a prominent lymph node in the right neck deep to the sternocleidomastoid measuring 29 mm x 11 mm x 6 mm within normal fatty hilum.  No other enlarged lymph nodes and this probably within normal limits.  IMPRESSION: Solid left inferior pole thyroid nodule measuring 22 x 20 x 18 mm. Ultrasound-guided biopsy recommended.   Original Report Authenticated By: Andreas Newport, M.D.     Ct Abdomen Pelvis W Contrast   03/28/2012  **ADDENDUM** CREATED: 03/28/2012 15:56:28  Addendum for clarification:  As noted in the CT chest findings/impression, there is a 2.0 cm left thyroid nodule, unrelated to the presenting complaint. However, if not previously characterized, a thyroid ultrasound is suggested for further evaluation.  **END ADDENDUM** SIGNED BY: Charline Bills, M.D.    03/28/2012  *RADIOLOGY REPORT*  Clinical Data:  Heme positive stool, recent colonoscopy with two suspicious polyps.  CT CHEST, ABDOMEN AND PELVIS WITH CONTRAST  Technique:  Multidetector CT imaging of the chest, abdomen and pelvis was performed following the standard protocol during bolus  administration of intravenous contrast.  Contrast: OMNIPAQUE IOHEXOL 300 MG/ML  SOLN  Comparison:  None.  CT CHEST  Findings:  Lungs are clear.  No suspicious pulmonary nodules.  No pleural effusion or pneumothorax.  The visualized thyroid is notable for a 2.0 cm left thyroid nodule.  The heart is normal in size.  No pericardial effusion.  No suspicious mediastinal, hilar, or axillary lymphadenopathy.  Degenerative changes of the thoracic spine.  IMPRESSION: No evidence of metastatic disease in the chest.  2.0 cm left thyroid nodule.  CT ABDOMEN AND PELVIS  Findings:  Liver, spleen, and pancreas are within normal limits.  Low density bilateral adrenal nodules, favored to reflect adrenal adenomas, although strictly indeterminate. Right adrenal nodule measures 3.1 x 1.9 cm. Dominant left adrenal nodule measures 1.7 x 1.3 cm.  Multiple left renal sinus cysts.  Right kidney is unremarkable.  No hydronephrosis.  No evidence of bowel obstruction.  Normal appendix.  Eccentric wall thickening involving the ascending colon (series 3/image 60; coronal image 63), suspicious for primary colonic adenocarcinoma.  Associated small ileocolic lymph nodes measuring up to 8 mm short axis (series 3/image 53).  Additional small retroperitoneal/pelvic lymph nodes which do not meet pathologic CT size criteria.  No evidence of abdominal aortic aneurysm.  No abdominopelvic ascites.  Mild prostatomegaly, measuring 5.4 cm in transverse dimension.  Bladder is mildly thick-walled although underdistended.  Degenerative changes of the lumbar spine.  Multiple probable bone islands in the bilateral pelvis.  IMPRESSION: Eccentric wall thickening involving the ascending colon, suspicious for primary colonic adenocarcinoma.  Associated small ileocolic lymph nodes measuring up to 8 mm short axis.  Otherwise, no evidence of metastatic disease in the abdomen/pelvis.  Original Report Authenticated By: Charline Bills, M.D.     US Thyroid  Biopsy   04/10/2012  *RADIOLOGY REPORT*  Clinical data:  Thyroid nodule noted on recent CT chest and ultrasound studies.  ULTRASOUND-GUIDED THYROID ASPIRATION BIOPSY  Technique:  Survey ultrasound was performed and the dominant lesion in the inferior left lobe was localized.  An appropriate skin entry site was determined.  Skin was marked, then prepped with Betadine, draped in usual sterile fashion, and infiltrated locally with 1% lidocaine.  Under real-time ultrasound guidance, 4  passes were made into the lesion with 25 gauge needles.  The patient tolerated procedure well, with no immediate complications.  IMPRESSION 1.  Technically successful ultrasound-guided thyroid aspiration biopsy   Original Report Authenticated By: Osa Craver, M.D.          Review of Systems  Constitutional: Negative for fever, chills and diaphoresis.  HENT: Negative for nosebleeds, sore throat, facial swelling, mouth sores, trouble swallowing and ear discharge.   Eyes: Negative for photophobia, discharge and visual disturbance.  Respiratory: Negative for choking, chest tightness, shortness of breath and stridor.   Cardiovascular: Negative for chest pain and palpitations.  Gastrointestinal: Negative for nausea, vomiting, abdominal pain, diarrhea, constipation, blood in stool, abdominal distention, anal bleeding and rectal pain.  Genitourinary: Negative for dysuria, urgency, difficulty urinating and testicular pain.  Musculoskeletal: Negative for myalgias, back pain, arthralgias and gait problem.  Skin: Negative for color change, pallor, rash and wound.  Neurological: Negative for dizziness, speech difficulty, weakness, numbness and headaches.  Hematological: Negative for adenopathy. Does not bruise/bleed easily.  Psychiatric/Behavioral: Negative for hallucinations, confusion and agitation.          Objective:     Physical Exam  Constitutional: He is oriented to person, place, and time. He appears  well-developed and well-nourished. No distress.  HENT:   Head: Normocephalic.   Mouth/Throat: Oropharynx is clear and moist. No oropharyngeal exudate.  Eyes: Conjunctivae and EOM are normal. Pupils are equal, round, and reactive to light. No scleral icterus.  Neck: Normal range of motion. Neck supple. No tracheal tenderness present. Carotid bruit is not present. No tracheal deviation and no edema present. No thyromegaly present.       Thick neck.  Hard to feel discrete mass  Cardiovascular: Normal rate, regular rhythm and intact distal pulses.   Pulmonary/Chest: Effort normal and breath sounds normal. No stridor. No respiratory distress.  Abdominal: Soft. He exhibits no distension and no mass. There is no tenderness. There is no rebound and no guarding. Hernia confirmed negative in the right inguinal area and confirmed negative in the left inguinal area.       Morbidly obese with moderate panniculus.  Beltline at umbilicus  Musculoskeletal: Normal range of motion. He exhibits no tenderness.  Lymphadenopathy:    He has no cervical adenopathy.       Right: No inguinal adenopathy present.       Left: No inguinal adenopathy present.  Neurological: He is alert and oriented to person, place, and time. No cranial nerve deficit. He exhibits normal muscle tone. Coordination normal.  Skin: Skin is warm and dry. No rash noted. He is not diaphoretic. No erythema. No pallor.  Psychiatric: He has a normal mood and affect. His behavior is normal. Judgment and thought content normal.          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   He needs total proctocolectomy       Plan:       I spent >69minutes reviewing his data, examining him, discussing with colleagues, discussing with the patient and family.  Numerous questions were answered. He s/p appropriate screening of his upper digestive tract  by Dr. Christella Hartigan to rule out any foregut polyp/cancer.   The patient requires total proctocolectomy.  I think he is a reasonable laparoscopic candidate even though he is morbidly obese.  I will do this in conjunction with her new colorectal surgeon, Dr. [name], with whom I discussed this case at length.  I discussed the indications risks benefits alternatives and  procedure in detail with the patient, his wife, his sister:   The anatomy & physiology of the digestive tract was discussed.  The pathophysiology was discussed.  Natural history risks without surgery was discussed.   I worked to give an overview of the disease and the frequent need to have multispecialty involvement.  I feel the risks of no intervention will lead to serious problems that outweigh the operative risks; therefore, I recommended a total proctocolectomy to remove the pathology.  Laparoscopic & open techniques were discussed.  We will work to preserve anal & pelvic floor function without sacrificing cure.  Plan to do ileal pouch anal anastomosis.  He will need at least temporary loop ileostomy diversion of the anastomosis.  I did discuss risks of possible permanent ileostomy.  Possible issues of pouchitis etc. Postoperatively were discussed as well.   Risks such as bleeding, infection, abscess, leak, reoperation, possible ostomy, hernia, heart attack, death, and other risks were discussed.  I noted a good likelihood this will help address the problem.   Goals of post-operative recovery were discussed as well.  We will work to minimize complications.  An educational handout on the pathology was given as well.  It is reasonable to get a second opinion at the other night major academic centers, especially [facility].  Questions were answered.  The patient, wife & sister  express understanding & wish to consider surgery.  The sister seems to get the best.  The wife the least.  He wants to pray about it first.  He promised me he will call to  give me his answer.   The thyroid nodule is a follicular lesion.  He would benefit from left thyroid lobectomy to ensure it is not a cancer (vs adenoma).  His PCP & GI med wish concurrent surgery since pt at risk for lost to f/u.  The anatomy and physiology of the thyroid gland and organs of the neck were discussed.  Pathophysiology of thyroid problems were discussed.  Options were discussed, and I made a recommendation to remove part (and possibly all) of the thyroid gland to treat the pathology.  Risks of bleeding, infection, injury to other organs including nerves, reoperation, death, and other risks were discussed.   I noted a good likelihood this will help address the problem.  While there are risks, I feel the risks of nonoperative management are greater; therefore, I feel surgery offers the best option. Educational material was given to help further explain the topics & concerns from our discussion.  We will work to minimize complications.

## 2012-05-20 NOTE — Brief Op Note (Signed)
05/20/2012  3:52 PM  PATIENT:  Cory Harper  56 y.o. male  Patient Care Team: [name], MD as PCP - General (Family Medicine) Rachael Fee, MD as Consulting Physician (Gastroenterology) [name], MD as Consulting Physician (General Surgery)   PRE-OPERATIVE DIAGNOSIS:  thyroid nodule Familial adenomatous polyposis (FAP), cecal mass (probable cancer)  POST-OPERATIVE DIAGNOSIS: thyroid nodule Familial adenomatous polyposis (FAP), cecal mass (probable cancer)  PROCEDURE:  Procedure(s) (LRB) with comments: COLON RESECTION LAPAROSCOPIC (N/A) - Laparoscopic Proctocolectomy, Ileal Pouch Anal Anastomoisis, Loop Ileostomy THYROID LOBECTOMY (Left) - LEFT THYROID LOBECTOMY Excision of anal canal polyp  SURGEON:  Surgeon(s) and Role:    * Ardeth Sportsman, MD - Primary    * [name], MD - Assisting  ANESTHESIA:   local and general  EBL:  Total I/O In: 5000 [I.V.:5000] Out: 1500 [Urine:1025; Blood:475]  BLOOD ADMINISTERED:none  DRAINS: (19) Blake drain(s) in the pelvis   LOCAL MEDICATIONS USED:  BUPIVICAINE   SPECIMEN:  Source of Specimen:  1. Abdominal colon to prox rectum  2. Mid/distal rectum  3.  Anal canal polyp  4. Anastomtic rings (stitch in ileal pouch) 5. Left thyroid lobe  DISPOSITION OF SPECIMEN:  PATHOLOGY  COUNTS:  YES  TOURNIQUET:  * No tourniquets in log *  DICTATION: .Other Dictation: Dictation Number [id number]  PLAN OF CARE: Admit to inpatient   PATIENT DISPOSITION:  PACU - hemodynamically stable.   Delay start of Pharmacological VTE agent (>24hrs) due to surgical blood loss or risk of bleeding: no

## 2012-05-21 ENCOUNTER — Encounter (HOSPITAL_COMMUNITY): Payer: Self-pay | Admitting: Surgery

## 2012-05-21 LAB — GLUCOSE, CAPILLARY: Glucose-Capillary: 263 mg/dL — ABNORMAL HIGH (ref 70–99)

## 2012-05-21 LAB — BASIC METABOLIC PANEL
BUN: 10 mg/dL (ref 6–23)
Chloride: 101 mEq/L (ref 96–112)
Creatinine, Ser: 0.78 mg/dL (ref 0.50–1.35)
GFR calc Af Amer: >90 mL/min (ref >90)

## 2012-05-21 LAB — CBC
HCT: 34.6 % — ABNORMAL LOW (ref 39.0–52.0)
MCH: 28.1 pg (ref 26.0–34.0)
MCHC: 32.1 g/dL (ref 30.0–36.0)
MCV: 87.6 fL (ref 78.0–100.0)
RDW: 14.8 % (ref 11.5–15.5)

## 2012-05-21 LAB — HEMOGLOBIN A1C
Hgb A1c MFr Bld: 7.7 % — ABNORMAL HIGH (ref <5.7)
Mean Plasma Glucose: 174 mg/dL — ABNORMAL HIGH (ref <117)

## 2012-05-21 NOTE — Progress Notes (Signed)
CARE MANAGEMENT NOTE 05/21/2012  Patient:  Cory Harper, Cory Harper   Account Number:  [id number]  Date Initiated:  05/21/2012  Documentation initiated by:  Jovan Colligan  Subjective/Objective Assessment:   patient s/p surg low bp requiring iv volume challenge, sdu     Action/Plan:   home   Anticipated DC Date:  05/22/2012   Anticipated DC Plan:  HOME/SELF CARE  In-house referral  NA      DC Planning Services  NA      [facility] Choice  NA   Choice offered to / List presented to:  NA   DME arranged  NA      DME agency  NA     HH arranged  NA      HH agency  NA   Status of service:  In process, will continue to follow Medicare Important Message given?   (If response is "NO", the following Medicare IM given date fields will be blank) Date Medicare IM given:   Date Additional Medicare IM given:    Discharge Disposition:    Per UR Regulation:  Reviewed for med. necessity/level of care/duration of stay  If discussed at Long Length of Stay Meetings, dates discussed:    Comments:  [id number] Earlene Plater, RN, BSN, CCM: CHART REVIEWED AND UPDATED. NO DISCHARGE NEEDS PRESENT AT THIS TIME. CASE MANAGEMENT [phone]

## 2012-05-21 NOTE — Consult Note (Addendum)
WOC ostomy consult  Stoma type/location: right upper quadrant ileostomy with rod (red, rubber catheter) Stomal assessment/size: visualized through pouch, not measured Peristomal assessment: Not visualized Output Small amount of serosanguinous drainage Ostomy pouching: 1pc.Karaya pouch from surgery in place  Education provided: Patient in good spirits, wife at bedside.  Patient is headed off unit at the moment and will transfer to 5W following that.  I will plan for 1st pouch change tomorrow morning, before noon.  Supplies ordered and at bedside: Skin barrier #2, Pouch #649, barrier ring [id number]. I will follow along with you. Thanks, Ladona Mow, MSN, RN, [facility], CWOCN [phone])

## 2012-05-21 NOTE — Progress Notes (Signed)
[facility] [id number] [birth year]   Subjective:  Sore - IV meds help Family at bedside No events in Stepdown  Objective:  Vital signs:  Filed Vitals:   05/21/12 0200 05/21/12 0300 05/21/12 0400 05/21/12 0504  BP: 101/53 142/81 116/74   Pulse: 87 84 81   Temp:   98.5 F (36.9 C)   TempSrc:   Oral   Resp: 20 16 8    Height:      Weight:    267 lb 13.7 oz (121.5 kg)  SpO2: 97% 99% 97%     Last BM Date: 05/19/12  Intake/Output   Yesterday:  10/15 0701 - 10/16 0700 In: 4098.1 [I.V.:6868.8; IV Piggyback:200] Out: 3350 [Urine:2050; Drains:825; Blood:475] This shift:  Total I/O In: 875 [I.V.:675; IV Piggyback:200] Out: 950 [Urine:650; Drains:300]  Bowel function:  Flatus: n  BM: n  Physical Exam:  General: Pt awake/alert/oriented x4 in no acute distress Eyes: PERRL, normal EOM.  Sclera clear.  No icterus Neuro: CN II-XII intact w/o focal sensory/motor deficits. Lymph: No head/neck/groin lymphadenopathy Psych:  No delerium/psychosis/paranoia HENT: Normocephalic, Mucus membranes moist.  No thrush Neck: Supple, No tracheal deviation Chest: No chest wall pain w good excursion CV:  Pulses intact.  Regular rhythm Abdomen: Soft.  Nondistended.  Mildly tender at incisions only.  No incarcerated hernias.  Ileostomy viable - scant serosanguinous fluid in bag Ext:  SCDs BLE.  No mjr edema.  No cyanosis Skin: No petechiae / purpurae  Problem List:  Principal Problem:  *Familial adenomatous polyposis coli Active Problems:  DIABETES MELLITUS, TYPE II  Obesity (BMI 30-39.9)  HYPERTENSION, BENIGN ESSENTIAL  Follicular lesion of left thyroid gland  Cecal cancer  Anal polyp s/p polypectomy   Assessment  [name]  56 y.o. male  1 Day Post-Op  Procedure(s): COLON RESECTION LAPAROSCOPIC LOW ANTERIOR BOWEL RESECTION THYROID LOBECTOMY  Stabilizing  Plan:  -transfer to surgical floor -ostomy care/training -DM control w SSI & give one of the PO DM  meds -VTE prophylaxis- SCDs, etc -mobilize as tolerated to help recovery -f/u pathology  Ardeth Sportsman, M.D., F.A.C.S. Gastrointestinal and Minimally Invasive Surgery [facility] [address] [place], [place] [address] [phone] Main / Paging [phone] Voice Mail   05/21/2012  CARE TEAM:  PCP: Crawford Givens, MD  Outpatient Care Team: Patient Care Team: [name], MD as PCP - General (Family Medicine) Rachael Fee, MD as Consulting Physician (Gastroenterology) [name], MD as Consulting Physician (General Surgery)  Inpatient Treatment Team: Treatment Team: Attending Provider: Ardeth Sportsman, MD; Rounding Team: Bishop Limbo, MD; Registered Nurse: Oda Kilts, RN   Results:   Labs: Results for orders placed during the hospital encounter of 05/20/12 (from the past 48 hour(s))  ABO/RH     Status: Normal   Collection Time   05/20/12  5:30 AM      Component Value Range Comment   ABO/RH(D) O POS     TYPE AND SCREEN     Status: Normal   Collection Time   05/20/12  5:55 AM      Component Value Range Comment   ABO/RH(D) O POS      Antibody Screen NEG      Sample Expiration 05/23/2012     GLUCOSE, CAPILLARY     Status: Abnormal   Collection Time   05/20/12  6:01 AM      Component Value Range Comment   Glucose-Capillary 149 (*) 70 - 99 mg/dL  GLUCOSE, CAPILLARY     Status: Abnormal   Collection Time   05/20/12 11:15 AM      Component Value Range Comment   Glucose-Capillary 269 (*) 70 - 99 mg/dL    Comment 1 Documented in Chart     GLUCOSE, CAPILLARY     Status: Abnormal   Collection Time   05/20/12  2:12 PM      Component Value Range Comment   Glucose-Capillary 239 (*) 70 - 99 mg/dL   GLUCOSE, CAPILLARY     Status: Abnormal   Collection Time   05/20/12  3:55 PM      Component Value Range Comment   Glucose-Capillary 196 (*) 70 - 99 mg/dL    Comment 1 Documented in Chart      Comment 2 Notify RN     CBC      Status: Abnormal   Collection Time   05/20/12  5:58 PM      Component Value Range Comment   WBC 11.5 (*) 4.0 - 10.5 K/uL    RBC 4.17 (*) 4.22 - 5.81 MIL/uL    Hemoglobin 11.7 (*) 13.0 - 17.0 g/dL    HCT 09.8 (*) 11.9 - 52.0 %    MCV 86.8  78.0 - 100.0 fL    MCH 28.1  26.0 - 34.0 pg    MCHC 32.3  30.0 - 36.0 g/dL    RDW 14.7  82.9 - 56.2 %    Platelets 264  150 - 400 K/uL   CREATININE, SERUM     Status: Normal   Collection Time   05/20/12  5:58 PM      Component Value Range Comment   Creatinine, Ser 0.69  0.50 - 1.35 mg/dL    GFR calc non Af Amer >90  >90 mL/min    GFR calc Af Amer >90  >90 mL/min   HEMOGLOBIN A1C     Status: Abnormal   Collection Time   05/20/12  5:58 PM      Component Value Range Comment   Hemoglobin A1C 7.7 (*) <5.7 %    Mean Plasma Glucose 174 (*) <117 mg/dL   GLUCOSE, CAPILLARY     Status: Abnormal   Collection Time   05/20/12  9:24 PM      Component Value Range Comment   Glucose-Capillary 258 (*) 70 - 99 mg/dL   BASIC METABOLIC PANEL     Status: Abnormal   Collection Time   05/21/12  3:15 AM      Component Value Range Comment   Sodium 135  135 - 145 mEq/L    Potassium 4.1  3.5 - 5.1 mEq/L    Chloride 101  96 - 112 mEq/L    CO2 25  19 - 32 mEq/L    Glucose, Bld 248 (*) 70 - 99 mg/dL    BUN 10  6 - 23 mg/dL    Creatinine, Ser 1.30  0.50 - 1.35 mg/dL    Calcium 8.1 (*) 8.4 - 10.5 mg/dL    GFR calc non Af Amer >90  >90 mL/min    GFR calc Af Amer >90  >90 mL/min   CBC     Status: Abnormal   Collection Time   05/21/12  3:15 AM      Component Value Range Comment   WBC 6.6  4.0 - 10.5 K/uL    RBC 3.95 (*) 4.22 - 5.81 MIL/uL    Hemoglobin 11.1 (*) 13.0 - 17.0 g/dL    HCT  34.6 (*) 39.0 - 52.0 %    MCV 87.6  78.0 - 100.0 fL    MCH 28.1  26.0 - 34.0 pg    MCHC 32.1  30.0 - 36.0 g/dL    RDW 62.1  30.8 - 65.7 %    Platelets 282  150 - 400 K/uL     Imaging / Studies: No results found.  Medications / Allergies: per  chart  Antibiotics: Anti-infectives     Start     Dose/Rate Route Frequency Ordered Stop   05/20/12 2000   metroNIDAZOLE (FLAGYL) IVPB 500 mg        500 mg 100 mL/hr over 60 Minutes Intravenous Every 6 hours 05/20/12 1739 05/21/12 1359   05/20/12 0715   clindamycin (CLEOCIN) 900 mg, gentamicin (GARAMYCIN) 240 mg in sodium chloride 0.9 % 1,000 mL for intraperitoneal lavage         Intraperitoneal To Surgery 05/20/12 0706 05/20/12 1013   05/20/12 0515   metroNIDAZOLE (FLAGYL) IVPB 500 mg        500 mg 100 mL/hr over 60 Minutes Intravenous 60 min pre-op 05/20/12 0515 05/20/12 1419   05/20/12 0515   cefTRIAXone (ROCEPHIN) 2 g in dextrose 5 % 50 mL IVPB     Comments: Pharmacy may adjust dosing strength per protocol      2 g 100 mL/hr over 30 Minutes Intravenous On call to O.R. 05/20/12 8469 05/20/12 0820

## 2012-05-22 LAB — GLUCOSE, CAPILLARY
Glucose-Capillary: 211 mg/dL — ABNORMAL HIGH (ref 70–99)
Glucose-Capillary: 299 mg/dL — ABNORMAL HIGH (ref 70–99)
Glucose-Capillary: 237 mg/dL — ABNORMAL HIGH (ref 70–99)

## 2012-05-22 MED ORDER — INSULIN ASPART 100 UNIT/ML ~~LOC~~ SOLN
0.0000 [IU] | Freq: Three times a day (TID) | SUBCUTANEOUS | Status: DC
  Administered 2012-05-22 (×2): 7 [IU] via SUBCUTANEOUS
  Administered 2012-05-22: 11 [IU] via SUBCUTANEOUS
  Administered 2012-05-23: 4 [IU] via SUBCUTANEOUS
  Administered 2012-05-23: 7 [IU] via SUBCUTANEOUS
  Administered 2012-05-23: 4 [IU] via SUBCUTANEOUS
  Administered 2012-05-24: 3 [IU] via SUBCUTANEOUS
  Administered 2012-05-24: 4 [IU] via SUBCUTANEOUS
  Administered 2012-05-24: 3 [IU] via SUBCUTANEOUS
  Administered 2012-05-25: 4 [IU] via SUBCUTANEOUS
  Administered 2012-05-25 (×2): 3 [IU] via SUBCUTANEOUS
  Administered 2012-05-26: 4 [IU] via SUBCUTANEOUS

## 2012-05-22 MED ORDER — INSULIN ASPART 100 UNIT/ML ~~LOC~~ SOLN: 2.0000 [IU] | Freq: Every day | SUBCUTANEOUS | Status: DC

## 2012-05-22 MED ORDER — INSULIN ASPART 100 UNIT/ML ~~LOC~~ SOLN
2.0000 [IU] | Freq: Every day | SUBCUTANEOUS | Status: DC
  Administered 2012-05-22: 8 [IU] via SUBCUTANEOUS
  Administered 2012-05-23 – 2012-05-25 (×3): 6 [IU] via SUBCUTANEOUS

## 2012-05-22 NOTE — Progress Notes (Signed)
Cory Harper [id number] July 03, 1956   Subjective:  Less sore - IV meds help Wife at bedside Transferred to floor  Objective:  Vital signs:  Filed Vitals:   05/21/12 1500 05/21/12 1704 05/21/12 2119 05/22/12 0605  BP: 111/58  114/69 115/67  Pulse: 68  84 85  Temp:  98.1 F (36.7 C) 98.1 F (36.7 C) 98.7 F (37.1 C)  TempSrc:  Oral Oral Oral  Resp: 9  20 18   Height:      Weight:      SpO2: 100%  95% 95%    Last BM Date: 05/19/12  Intake/Output   Yesterday:  10/16 0701 - 10/17 0700 In: 2065 [P.O.:240; I.V.:1725; IV Piggyback:100] Out: 3580 [Urine:3150; Drains:380; Stool:50] This shift:  Total I/O In: 892.5 [I.V.:892.5] Out: 1390 [Urine:1250; Drains:140]  Bowel function:  Flatus: Y  BM: n  Physical Exam:  General: Pt awake/alert/oriented x4 in no acute distress Eyes: PERRL, normal EOM.  Sclera clear.  No icterus Neuro: CN II-XII intact w/o focal sensory/motor deficits. Lymph: No head/neck/groin lymphadenopathy Psych:  No delerium/psychosis/paranoia HENT: Normocephalic, Mucus membranes moist.  No thrush Neck: Supple, No tracheal deviation Chest: No chest wall pain w good excursion CV:  Pulses intact.  Regular rhythm Abdomen: Soft.  Morbidly obese.  Nondistended.  Mildly tender at incisions only.  No incarcerated hernias.  Ileostomy viable - scant serosanguinous fluid in bag Ext:  SCDs BLE.  No mjr edema.  No cyanosis Skin: No petechiae / purpurae  Problem List:  Principal Problem:  *Familial adenomatous polyposis coli Active Problems:  DIABETES MELLITUS, TYPE II  Obesity (BMI 30-39.9)  HYPERTENSION, BENIGN ESSENTIAL  Follicular lesion of left thyroid gland  Cecal cancer  Anal polyp s/p polypectomy   Assessment  Cory Harper  56 y.o. male  2 Days Post-Op  Procedure(s): COLON RESECTION LAPAROSCOPIC LOW ANTERIOR BOWEL RESECTION THYROID LOBECTOMY  Stabilizing  Plan:  -adv diet gradually -ostomy care/training -DM control w SSI & give one  of the PO DM meds -VTE prophylaxis- SCDs, etc -mobilize as tolerated to help recovery -f/u pathology  Ardeth Sportsman, M.D., F.A.C.S. Gastrointestinal and Minimally Invasive Surgery [facility] [address], [address] [place], [place] [address] [phone] Main / Paging [phone] Voice Mail   05/22/2012  CARE TEAM:  PCP: Crawford Givens, MD  Outpatient Care Team: Patient Care Team: [name], MD as PCP - General (Family Medicine) Rachael Fee, MD as Consulting Physician (Gastroenterology) [name], MD as Consulting Physician (General Surgery)  Inpatient Treatment Team: Treatment Team: Attending Provider: Ardeth Sportsman, MD; Registered Nurse: Oda Kilts, RN; Registered Nurse: Tristan Schroeder, RN   Results:   Labs: Results for orders placed during the hospital encounter of 05/20/12 (from the past 48 hour(s))  GLUCOSE, CAPILLARY     Status: Abnormal   Collection Time   05/20/12 11:15 AM      Component Value Range Comment   Glucose-Capillary 269 (*) 70 - 99 mg/dL    Comment 1 Documented in Chart     GLUCOSE, CAPILLARY     Status: Abnormal   Collection Time   05/20/12  2:12 PM      Component Value Range Comment   Glucose-Capillary 239 (*) 70 - 99 mg/dL   GLUCOSE, CAPILLARY     Status: Abnormal   Collection Time   05/20/12  3:55 PM      Component Value Range Comment   Glucose-Capillary 196 (*) 70 - 99  mg/dL    Comment 1 Documented in Chart      Comment 2 Notify RN     CBC     Status: Abnormal   Collection Time   05/20/12  5:58 PM      Component Value Range Comment   WBC 11.5 (*) 4.0 - 10.5 K/uL    RBC 4.17 (*) 4.22 - 5.81 MIL/uL    Hemoglobin 11.7 (*) 13.0 - 17.0 g/dL    HCT 40.9 (*) 81.1 - 52.0 %    MCV 86.8  78.0 - 100.0 fL    MCH 28.1  26.0 - 34.0 pg    MCHC 32.3  30.0 - 36.0 g/dL    RDW 91.4  78.2 - 95.6 %    Platelets 264  150 - 400 K/uL   CREATININE, SERUM     Status: Normal   Collection Time    05/20/12  5:58 PM      Component Value Range Comment   Creatinine, Ser 0.69  0.50 - 1.35 mg/dL    GFR calc non Af Amer >90  >90 mL/min    GFR calc Af Amer >90  >90 mL/min   HEMOGLOBIN A1C     Status: Abnormal   Collection Time   05/20/12  5:58 PM      Component Value Range Comment   Hemoglobin A1C 7.7 (*) <5.7 %    Mean Plasma Glucose 174 (*) <117 mg/dL   GLUCOSE, CAPILLARY     Status: Abnormal   Collection Time   05/20/12  9:24 PM      Component Value Range Comment   Glucose-Capillary 258 (*) 70 - 99 mg/dL   BASIC METABOLIC PANEL     Status: Abnormal   Collection Time   05/21/12  3:15 AM      Component Value Range Comment   Sodium 135  135 - 145 mEq/L    Potassium 4.1  3.5 - 5.1 mEq/L    Chloride 101  96 - 112 mEq/L    CO2 25  19 - 32 mEq/L    Glucose, Bld 248 (*) 70 - 99 mg/dL    BUN 10  6 - 23 mg/dL    Creatinine, Ser 2.13  0.50 - 1.35 mg/dL    Calcium 8.1 (*) 8.4 - 10.5 mg/dL    GFR calc non Af Amer >90  >90 mL/min    GFR calc Af Amer >90  >90 mL/min   CBC     Status: Abnormal   Collection Time   05/21/12  3:15 AM      Component Value Range Comment   WBC 6.6  4.0 - 10.5 K/uL    RBC 3.95 (*) 4.22 - 5.81 MIL/uL    Hemoglobin 11.1 (*) 13.0 - 17.0 g/dL    HCT 08.6 (*) 57.8 - 52.0 %    MCV 87.6  78.0 - 100.0 fL    MCH 28.1  26.0 - 34.0 pg    MCHC 32.1  30.0 - 36.0 g/dL    RDW 46.9  62.9 - 52.8 %    Platelets 282  150 - 400 K/uL   GLUCOSE, CAPILLARY     Status: Abnormal   Collection Time   05/21/12  8:07 AM      Component Value Range Comment   Glucose-Capillary 263 (*) 70 - 99 mg/dL    Comment 1 Documented in Chart      Comment 2 Notify RN     GLUCOSE, CAPILLARY  Status: Abnormal   Collection Time   05/21/12 12:07 PM      Component Value Range Comment   Glucose-Capillary 219 (*) 70 - 99 mg/dL    Comment 1 Documented in Chart      Comment 2 Notify RN     GLUCOSE, CAPILLARY     Status: Abnormal   Collection Time   05/21/12  3:55 PM      Component Value Range  Comment   Glucose-Capillary 228 (*) 70 - 99 mg/dL   GLUCOSE, CAPILLARY     Status: Abnormal   Collection Time   05/21/12 10:03 PM      Component Value Range Comment   Glucose-Capillary 287 (*) 70 - 99 mg/dL    Comment 1 Notify RN       Imaging / Studies: No results found.  Medications / Allergies: per chart  Antibiotics: Anti-infectives     Start     Dose/Rate Route Frequency Ordered Stop   05/20/12 2000   metroNIDAZOLE (FLAGYL) IVPB 500 mg        500 mg 100 mL/hr over 60 Minutes Intravenous Every 6 hours 05/20/12 1739 05/21/12 1010   05/20/12 0715   clindamycin (CLEOCIN) 900 mg, gentamicin (GARAMYCIN) 240 mg in sodium chloride 0.9 % 1,000 mL for intraperitoneal lavage         Intraperitoneal To Surgery 05/20/12 0706 05/20/12 1013   05/20/12 0515   metroNIDAZOLE (FLAGYL) IVPB 500 mg        500 mg 100 mL/hr over 60 Minutes Intravenous 60 min pre-op 05/20/12 0515 05/20/12 1419   05/20/12 0515   cefTRIAXone (ROCEPHIN) 2 g in dextrose 5 % 50 mL IVPB     Comments: Pharmacy may adjust dosing strength per protocol      2 g 100 mL/hr over 30 Minutes Intravenous On call to O.R. 05/20/12 4098 05/20/12 0820

## 2012-05-22 NOTE — Consult Note (Signed)
WOC ostomy consult  Stoma type/location: RUQ loop ileostomy with functional limb os at 6 o'clock.  Red rubber catheter rod in place. Stomal assessment/size: 1 and 1/4 inch, edematous.  Red, moist and functioning Peristomal assessment: intact, clear, crease at 6 o'clock Treatment options for stomal/peristomal skin:  Output 250 ml dark green effluent.  Pouch has over-filled at this time and is leaking. Ostomy pouching: 2pc. , 2 and 3/4 inch pouching system used today with 1 and 1/2 barrier rings (one circumferentially around stoma, second half used as an additional barrier from 3-9 o'clock..  Would expect to size downward to a 2 and 1/4 inch pouching system with future pouching changes and may even consider a 1-piece pouch depending on depth of crease as edema resolves. Education provided: Patient and wife taught that pouches will need to be emptied when one-third to one-half full and that at this time, it is important to notify staff when filled to this level.  Emptying technique not reviewed at this time as pouch is leaking, patient is fatigued (has been up in chair and walking since 8am and company has just left).  Pouch and stoma characteristics reviewed including edematous nature of current stoma; educational booklet left in room.  Demonstration of Lock and Roll pouch closure provided, patient able to give return demonstration prior to my leaving room.    WOC team will follow and will see tomorrow. Thanks, Ladona Mow, MSN, RN, [facility], CWOCN [phone])

## 2012-05-22 NOTE — Progress Notes (Signed)
Inpatient Diabetes Program Recommendations  AACE/ADA: New Consensus Statement on Inpatient Glycemic Control (2013)  Target Ranges:  Prepandial:   less than 140 mg/dL      Peak postprandial:   less than 180 mg/dL (1-2 hours)      Critically ill patients:  140 - 180 mg/dL   Reason for Visit: Hyperglycemia  Patient is a pleasant obese bus driver. Found to have 100s of polyps on a colonoscopy over a decade ago. Was recommended by Dr. Russella Dar with the [facility] to consider surgery. He held off. Was recommended to see a surgeon again in a few years later. He held off. Switched gastroenterologists since Dr. Christella Hartigan took care of his wife. Colonoscope was done. 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 CT scan very suspicious for cancer. He was sent to our group for surgical evaluation. Due to insurance issues, out new colorectal surgeon, Dr. Maisie Fus, was not immediately available. I fit him into clinic today. He comes today with his wife and sister. He wished to have them involved in discussion. His sister had a colonoscopy a few years ago that was completely clear. Due for colonoscopy five year followup in 2014. There are mother had an issue with emergency surgery and colostomy. Struggled with an abscess and a lot of ostomy problems. I think this is much of the reason why he has been hesitant to consider surgery.   Hx: Diabetes - non-insulin dependent.  Home meds: Onglyza 5 mg QD,  metformin 1000 mg bid and Amaryl 2 mg QD.  Checks blood sugars at home and states they usually run in the 100s - 150s.   Results for MYLO, CHOI (MRN [id number]) as of 05/22/2012 12:33  Ref. Range 05/21/2012 12:07 05/21/2012 15:55 05/21/2012 22:03 05/22/2012 07:35 05/22/2012 11:54  Glucose-Capillary Latest Range: 70-99 mg/dL 409 (H) 811 (H) 914 (H) 211 (H) 299 (H)   Results for BRONSEN, SERANO (MRN [id number]) as of 05/22/2012 12:33  Ref. Range 05/21/2012 03:15  Sodium Latest Range: 135-145 mEq/L 135  Potassium Latest Range:  3.5-5.1 mEq/L 4.1  Chloride Latest Range: 96-112 mEq/L 101  CO2 Latest Range: 19-32 mEq/L 25  BUN Latest Range: 6-23 mg/dL 10  Creatinine Latest Range: 0.50-1.35 mg/dL 0.86  Calcium Latest Range: 8.4-10.5 mg/dL 8.1 (L)  GFR calc non Af Amer Latest Range: >90 mL/min >90  GFR calc Af Amer Latest Range: >90 mL/min >90  Glucose Latest Range: 70-99 mg/dL 578 (H)    Recommendations:  Add small dose of basal insulin - Lantus 10 units QHS. May need meal coverage insulin when po intake improves.  Will follow.

## 2012-05-23 ENCOUNTER — Other Ambulatory Visit: Payer: BC Managed Care – PPO

## 2012-05-23 LAB — CBC
Hemoglobin: 9.8 g/dL — ABNORMAL LOW (ref 13.0–17.0)
MCHC: 31.8 g/dL (ref 30.0–36.0)
Platelets: 261 10*3/uL (ref 150–400)

## 2012-05-23 LAB — GLUCOSE, CAPILLARY
Glucose-Capillary: 166 mg/dL — ABNORMAL HIGH (ref 70–99)
Glucose-Capillary: 171 mg/dL — ABNORMAL HIGH (ref 70–99)

## 2012-05-23 LAB — BASIC METABOLIC PANEL
BUN: 9 mg/dL (ref 6–23)
Calcium: 8.6 mg/dL (ref 8.4–10.5)
GFR calc Af Amer: >90 mL/min (ref >90)
GFR calc non Af Amer: >90 mL/min (ref >90)
Potassium: 3.6 mEq/L (ref 3.5–5.1)
Sodium: 138 mEq/L (ref 135–145)

## 2012-05-23 MED ORDER — ATORVASTATIN CALCIUM 20 MG PO TABS
20.0000 mg | ORAL_TABLET | Freq: Every day | ORAL | Status: DC
  Administered 2012-05-23 – 2012-05-25 (×3): 20 mg via ORAL
  Filled 2012-05-23 (×4): qty 1

## 2012-05-23 MED ORDER — VITAMIN D3 25 MCG (1000 UNIT) PO TABS
1000.0000 [IU] | ORAL_TABLET | Freq: Two times a day (BID) | ORAL | Status: DC
Start: 2012-05-23 — End: 2012-05-26
  Administered 2012-05-23 – 2012-05-26 (×7): 1000 [IU] via ORAL
  Filled 2012-05-23 (×9): qty 1

## 2012-05-23 MED ORDER — BUPIVACAINE 0.25 % ON-Q PUMP DUAL CATH 300 ML
300.0000 mL | INJECTION | Status: DC
  Filled 2012-05-23 (×2): qty 300

## 2012-05-23 MED ORDER — METFORMIN HCL 500 MG PO TABS
500.0000 mg | ORAL_TABLET | Freq: Two times a day (BID) | ORAL | Status: DC
  Administered 2012-05-23 – 2012-05-26 (×7): 500 mg via ORAL
  Filled 2012-05-23 (×10): qty 1

## 2012-05-23 MED ORDER — LOPERAMIDE HCL 2 MG PO CAPS: 2.0000 mg | ORAL_CAPSULE | Freq: Three times a day (TID) | ORAL | Status: DC | PRN

## 2012-05-23 MED ORDER — OXYCODONE HCL 5 MG PO TABS
5.0000 mg | ORAL_TABLET | ORAL | Status: DC | PRN
  Administered 2012-05-25 – 2012-05-26 (×2): 10 mg via ORAL
  Filled 2012-05-23 (×2): qty 2

## 2012-05-23 MED ORDER — LOPERAMIDE HCL 2 MG PO CAPS
2.0000 mg | ORAL_CAPSULE | Freq: Every day | ORAL | Status: DC
  Administered 2012-05-23 – 2012-05-25 (×3): 2 mg via ORAL
  Filled 2012-05-23 (×4): qty 1

## 2012-05-23 MED ORDER — LINAGLIPTIN 5 MG PO TABS
5.0000 mg | ORAL_TABLET | Freq: Every day | ORAL | Status: DC
  Administered 2012-05-23 – 2012-05-26 (×4): 5 mg via ORAL
  Filled 2012-05-23 (×4): qty 1

## 2012-05-23 NOTE — Consult Note (Signed)
WOC ostomy follow up Stoma type/location: RUQ, loop ileostomy with support rod in place (red robinson cath) Stomal assessment/size: edematous, pink and moist Peristomal assessment: pouch intact at the time of assessment 2x today, bedside nurse contacted me this am and said she had changed pouch several times due to leakage at 7 and 9oclock.  I instructed pt to change pt over to 1pc flexible with 2- 2" barrier rings placed on the back of the wafer for gentle convexity and to build up crease to the right side where he is leaking. She placed this pouching system around 11 am.  I have assessed the system at 12 and at 3pm today and this appears to be working better.  I have left small Eakin and 2 convex barrier rings to try should this system begin to leak and explained rationale and pouching procedure to bedside nurse  Output: high volume liquid output, had to be emptied 2x while I was in the room.   Ostomy pouching: 1pc with 2" barrier ring x 2 applied per bedside nursing Education provided:  Demonstrated lock and roll closure to the pt and had him open and close pouch. I emptied the pouch into graduate for him, as this was difficult while in the bed.    WOC team will follow along Cory Harper Marlena Clipper, [place] [phone]

## 2012-05-23 NOTE — Progress Notes (Signed)
Cory Harper [id number] [birth year]   Subjective:  More sore after being more active - meds help Wife at bedside Walking in hallways  Objective:  Vital signs:  Filed Vitals:   05/22/12 2149 05/22/12 2212 05/22/12 2236 05/23/12 0629  BP: 122/72 124/76 114/72 125/76  Pulse: 70  67 74  Temp: 98.4 F (36.9 C)  98.6 F (37 C) 97.8 F (36.6 C)  TempSrc: Oral  Oral Oral  Resp: 18  18 18   Height:      Weight:      SpO2: 95%  95% 94%    Last BM Date: 05/19/12  Intake/Output   Yesterday:  10/17 0701 - 10/18 0700 In: 2059.6 [P.O.:240; I.V.:1819.6] Out: 2165 [Urine:800; Drains:490; Stool:875] This shift:  Total I/O In: 919.6 [I.V.:919.6] Out: 300 [Drains:100; Stool:200]  Bowel function:  Flatus: Y  BM: YES - liquid effluent  Physical Exam:  General: Pt awake/alert/oriented x4 in no acute distress Eyes: PERRL, normal EOM.  Sclera clear.  No icterus Neuro: CN II-XII intact w/o focal sensory/motor deficits. Lymph: No head/neck/groin lymphadenopathy Psych:  No delerium/psychosis/paranoia HENT: Normocephalic, Mucus membranes moist.  No thrush Neck: Supple, No tracheal deviation Chest: No chest wall pain w good excursion CV:  Pulses intact.  Regular rhythm Abdomen: Soft.  Morbidly obese.  Nondistended.  Mildly tender at incisions only.  No incarcerated hernias.  Ileostomy mildly dusky - gas / bilious effluent in bag Ext:  SCDs BLE.  No mjr edema.  No cyanosis Skin: No petechiae / purpurae  Problem List:  Principal Problem:  *Familial adenomatous polyposis coli Active Problems:  DIABETES MELLITUS, TYPE II  Obesity (BMI 30-39.9)  HYPERTENSION, BENIGN ESSENTIAL  Follicular lesion of left thyroid gland  Cecal cancer  Anal polyp s/p polypectomy   Assessment  Cory Harper  56 y.o. male  3 Days Post-Op  Procedure(s): COLON RESECTION LAPAROSCOPIC LOW ANTERIOR BOWEL RESECTION THYROID LOBECTOMY  Stabilizing  Plan:  -adv diet gradually -ostomy  care/training -ease into anti-diarrheal regimen -DM control w SSI & give more of the PO DM meds -VTE prophylaxis- SCDs, etc -mobilize as tolerated to help recovery -f/u pathology  Ardeth Sportsman, M.D., F.A.C.S. Gastrointestinal and Minimally Invasive Surgery [facility] [address] [place], [place] [address] [phone] Main / Paging [phone] Voice Mail   05/23/2012  CARE TEAM:  PCP: Crawford Givens, MD  Outpatient Care Team: Patient Care Team: [name], MD as PCP - General (Family Medicine) Rachael Fee, MD as Consulting Physician (Gastroenterology) [name], MD as Consulting Physician (General Surgery)  Inpatient Treatment Team: Treatment Team: Attending Provider: Ardeth Sportsman, MD; Registered Nurse: Oda Kilts, RN; Registered Nurse: Tristan Schroeder, RN; Technician: Vella Raring, NT; Registered Nurse: Bennetta Laos, RN   Results:   Labs: Results for orders placed during the hospital encounter of 05/20/12 (from the past 48 hour(s))  GLUCOSE, CAPILLARY     Status: Abnormal   Collection Time   05/21/12  8:07 AM      Component Value Range Comment   Glucose-Capillary 263 (*) 70 - 99 mg/dL    Comment 1 Documented in Chart      Comment 2 Notify RN     GLUCOSE, CAPILLARY     Status: Abnormal   Collection Time   05/21/12 12:07 PM      Component Value Range Comment   Glucose-Capillary 219 (*) 70 - 99 mg/dL    Comment 1 Documented in Chart  Comment 2 Notify RN     GLUCOSE, CAPILLARY     Status: Abnormal   Collection Time   05/21/12  3:55 PM      Component Value Range Comment   Glucose-Capillary 228 (*) 70 - 99 mg/dL   GLUCOSE, CAPILLARY     Status: Abnormal   Collection Time   05/21/12 10:03 PM      Component Value Range Comment   Glucose-Capillary 287 (*) 70 - 99 mg/dL    Comment 1 Notify RN     GLUCOSE, CAPILLARY     Status: Abnormal   Collection Time   05/22/12  7:35 AM       Component Value Range Comment   Glucose-Capillary 211 (*) 70 - 99 mg/dL   GLUCOSE, CAPILLARY     Status: Abnormal   Collection Time   05/22/12 11:54 AM      Component Value Range Comment   Glucose-Capillary 299 (*) 70 - 99 mg/dL   GLUCOSE, CAPILLARY     Status: Abnormal   Collection Time   05/22/12  4:39 PM      Component Value Range Comment   Glucose-Capillary 232 (*) 70 - 99 mg/dL   GLUCOSE, CAPILLARY     Status: Abnormal   Collection Time   05/22/12  9:42 PM      Component Value Range Comment   Glucose-Capillary 237 (*) 70 - 99 mg/dL   BASIC METABOLIC PANEL     Status: Abnormal   Collection Time   05/23/12  4:10 AM      Component Value Range Comment   Sodium 138  135 - 145 mEq/L    Potassium 3.6  3.5 - 5.1 mEq/L    Chloride 101  96 - 112 mEq/L    CO2 31  19 - 32 mEq/L    Glucose, Bld 156 (*) 70 - 99 mg/dL    BUN 9  6 - 23 mg/dL    Creatinine, Ser 1.61  0.50 - 1.35 mg/dL    Calcium 8.6  8.4 - 09.6 mg/dL    GFR calc non Af Amer >90  >90 mL/min    GFR calc Af Amer >90  >90 mL/min   CBC     Status: Abnormal   Collection Time   05/23/12  4:10 AM      Component Value Range Comment   WBC 6.8  4.0 - 10.5 K/uL    RBC 3.47 (*) 4.22 - 5.81 MIL/uL    Hemoglobin 9.8 (*) 13.0 - 17.0 g/dL    HCT 04.5 (*) 40.9 - 52.0 %    MCV 88.8  78.0 - 100.0 fL    MCH 28.2  26.0 - 34.0 pg    MCHC 31.8  30.0 - 36.0 g/dL    RDW 81.1  91.4 - 78.2 %    Platelets 261  150 - 400 K/uL     Imaging / Studies: No results found.  Medications / Allergies: per chart  Antibiotics: Anti-infectives     Start     Dose/Rate Route Frequency Ordered Stop   05/20/12 2000   metroNIDAZOLE (FLAGYL) IVPB 500 mg        500 mg 100 mL/hr over 60 Minutes Intravenous Every 6 hours 05/20/12 1739 05/21/12 1010   05/20/12 0715   clindamycin (CLEOCIN) 900 mg, gentamicin (GARAMYCIN) 240 mg in sodium chloride 0.9 % 1,000 mL for intraperitoneal lavage         Intraperitoneal To Surgery 05/20/12 0706 05/20/12 1013  05/20/12 0515   metroNIDAZOLE (FLAGYL) IVPB 500 mg        500 mg 100 mL/hr over 60 Minutes Intravenous 60 min pre-op 05/20/12 0515 05/20/12 1419   05/20/12 0515   cefTRIAXone (ROCEPHIN) 2 g in dextrose 5 % 50 mL IVPB     Comments: Pharmacy may adjust dosing strength per protocol      2 g 100 mL/hr over 30 Minutes Intravenous On call to O.R. 05/20/12 1601 05/20/12 0820

## 2012-05-24 LAB — BASIC METABOLIC PANEL
BUN: 9 mg/dL (ref 6–23)
Calcium: 8.5 mg/dL (ref 8.4–10.5)
Creatinine, Ser: 0.67 mg/dL (ref 0.50–1.35)
GFR calc Af Amer: >90 mL/min (ref >90)
GFR calc non Af Amer: >90 mL/min (ref >90)
Glucose, Bld: 121 mg/dL — ABNORMAL HIGH (ref 70–99)
Potassium: 3.6 mEq/L (ref 3.5–5.1)

## 2012-05-24 LAB — GLUCOSE, CAPILLARY: Glucose-Capillary: 171 mg/dL — ABNORMAL HIGH (ref 70–99)

## 2012-05-24 MED ORDER — HYDROMORPHONE HCL PF 1 MG/ML IJ SOLN
0.5000 mg | INTRAMUSCULAR | Status: DC | PRN
  Administered 2012-05-24: 1 mg via INTRAVENOUS
  Filled 2012-05-24: qty 1

## 2012-05-24 NOTE — Progress Notes (Signed)
Cory Harper [id number] 11-22-1955   Subjective:  Less sore after being more active - meds help Wife at bedside Trying solid food Ileostomy leaked several times yesterday Walking in hallways  Objective:  Vital signs:  Filed Vitals:   05/23/12 0931 05/23/12 1400 05/23/12 2200 05/24/12 0535  BP: 119/65 126/65 121/75 117/71  Pulse: 71 87 84 72  Temp: 98.7 F (37.1 C) 98.6 F (37 C) 98.6 F (37 C) 97.8 F (36.6 C)  TempSrc: Oral Oral Oral Oral  Resp: 18 18 18 18   Height:      Weight:      SpO2: 98% 95% 98% 96%    Last BM Date: 05/23/12  Intake/Output   Yesterday:  10/18 0701 - 10/19 0700 In: 2680 [P.O.:1080; I.V.:1600] Out: 3690 [Urine:2400; Drains:115; Stool:1175] This shift:  Total I/O In: 1240 [P.O.:240; I.V.:1000] Out: 1140 [Urine:750; Drains:40; Stool:350]  Bowel function:  Flatus: Y  BM: YES - liquid effluent.  Slightly thicker  Physical Exam:  General: Pt awake/alert/oriented x4 in no acute distress Eyes: PERRL, normal EOM.  Sclera clear.  No icterus Neuro: CN II-XII intact w/o focal sensory/motor deficits. Lymph: No head/neck/groin lymphadenopathy Psych:  No delerium/psychosis/paranoia HENT: Normocephalic, Mucus membranes moist.  No thrush Neck: Supple, No tracheal deviation Chest: No chest wall pain w good excursion CV:  Pulses intact.  Regular rhythm Abdomen: Soft.  Morbidly obese.  Nondistended.  Mildly tender at incisions only.  No incarcerated hernias.  Ileostomy mildly dusky - gas / bilious effluent in bag Ext:  SCDs BLE.  No mjr edema.  No cyanosis Skin: No petechiae / purpurae  Problem List:  Principal Problem:  *Familial adenomatous polyposis coli Active Problems:  DIABETES MELLITUS, TYPE II  Obesity (BMI 30-39.9)  HYPERTENSION, BENIGN ESSENTIAL  Follicular lesion of left thyroid gland  Cecal cancer  Anal polyp s/p polypectomy   Assessment  Cory Harper  56 y.o. male  4 Days Post-Op  Procedure(s): COLON RESECTION  LAPAROSCOPIC LOW ANTERIOR BOWEL RESECTION THYROID LOBECTOMY  Stabilizing  Plan:  -adv diet gradually -ostomy care/training -ease into anti-diarrheal regimen. QHS imodium only for now -DM better controlled w SSI & more of the PO DM meds.  Hold off on Lantus -VTE prophylaxis- SCDs, etc -mobilize as tolerated to help recovery -f/u pathology - prelim results:  T3N0 cecal cancer.  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 Gastrointestinal and Minimally Invasive Surgery [facility] [address] [place], [place] [address] [phone] Main / Paging [phone] Voice Mail   05/24/2012  CARE TEAM:  PCP: Crawford Givens, MD  Outpatient Care Team: Patient Care Team: [name], MD as PCP - General (Family Medicine) Rachael Fee, MD as Consulting Physician (Gastroenterology) [name], MD as Consulting Physician (General Surgery)  Inpatient Treatment Team: Treatment Team: Attending Provider: Ardeth Sportsman, MD; Registered Nurse: Oda Kilts, RN; Registered Nurse: Tristan Schroeder, RN; Technician: Vella Raring, NT; Registered Nurse: Bennetta Laos, RN; Respiratory Therapist: Renold Genta, RRT; Registered Nurse: Susy Manor, Student-RN; Registered Nurse: Heather Roberts, RN; Registered Nurse: Adriana Simas, RN   Results:   Labs: Results for orders placed during the hospital encounter of 05/20/12 (from the past 48 hour(s))  GLUCOSE, CAPILLARY     Status: Abnormal   Collection Time   05/22/12  7:35 AM      Component Value Range Comment   Glucose-Capillary 211 (*) 70 - 99 mg/dL  GLUCOSE, CAPILLARY     Status: Abnormal   Collection Time   05/22/12 11:54 AM      Component Value Range Comment   Glucose-Capillary 299 (*) 70 - 99 mg/dL   GLUCOSE, CAPILLARY     Status: Abnormal   Collection Time   05/22/12  4:39 PM      Component Value Range Comment   Glucose-Capillary 232  (*) 70 - 99 mg/dL   GLUCOSE, CAPILLARY     Status: Abnormal   Collection Time   05/22/12  9:42 PM      Component Value Range Comment   Glucose-Capillary 237 (*) 70 - 99 mg/dL   BASIC METABOLIC PANEL     Status: Abnormal   Collection Time   05/23/12  4:10 AM      Component Value Range Comment   Sodium 138  135 - 145 mEq/L    Potassium 3.6  3.5 - 5.1 mEq/L    Chloride 101  96 - 112 mEq/L    CO2 31  19 - 32 mEq/L    Glucose, Bld 156 (*) 70 - 99 mg/dL    BUN 9  6 - 23 mg/dL    Creatinine, Ser 7.82  0.50 - 1.35 mg/dL    Calcium 8.6  8.4 - 95.6 mg/dL    GFR calc non Af Amer >90  >90 mL/min    GFR calc Af Amer >90  >90 mL/min   CBC     Status: Abnormal   Collection Time   05/23/12  4:10 AM      Component Value Range Comment   WBC 6.8  4.0 - 10.5 K/uL    RBC 3.47 (*) 4.22 - 5.81 MIL/uL    Hemoglobin 9.8 (*) 13.0 - 17.0 g/dL    HCT 21.3 (*) 08.6 - 52.0 %    MCV 88.8  78.0 - 100.0 fL    MCH 28.2  26.0 - 34.0 pg    MCHC 31.8  30.0 - 36.0 g/dL    RDW 57.8  46.9 - 62.9 %    Platelets 261  150 - 400 K/uL   GLUCOSE, CAPILLARY     Status: Abnormal   Collection Time   05/23/12  7:24 AM      Component Value Range Comment   Glucose-Capillary 166 (*) 70 - 99 mg/dL   GLUCOSE, CAPILLARY     Status: Abnormal   Collection Time   05/23/12 11:41 AM      Component Value Range Comment   Glucose-Capillary 252 (*) 70 - 99 mg/dL   GLUCOSE, CAPILLARY     Status: Abnormal   Collection Time   05/23/12  5:11 PM      Component Value Range Comment   Glucose-Capillary 171 (*) 70 - 99 mg/dL   GLUCOSE, CAPILLARY     Status: Abnormal   Collection Time   05/23/12  9:50 PM      Component Value Range Comment   Glucose-Capillary 184 (*) 70 - 99 mg/dL   BASIC METABOLIC PANEL     Status: Abnormal   Collection Time   05/24/12  4:03 AM      Component Value Range Comment   Sodium 137  135 - 145 mEq/L    Potassium 3.6  3.5 - 5.1 mEq/L    Chloride 102  96 - 112 mEq/L    CO2 30  19 - 32 mEq/L    Glucose, Bld  121 (*) 70 - 99 mg/dL    BUN 9  6 - 23 mg/dL    Creatinine, Ser 1.61  0.50 - 1.35 mg/dL    Calcium 8.5  8.4 - 09.6 mg/dL    GFR calc non Af Amer >90  >90 mL/min    GFR calc Af Amer >90  >90 mL/min     Imaging / Studies: No results found.  Medications / Allergies: per chart  Antibiotics: Anti-infectives     Start     Dose/Rate Route Frequency Ordered Stop   05/20/12 2000   metroNIDAZOLE (FLAGYL) IVPB 500 mg        500 mg 100 mL/hr over 60 Minutes Intravenous Every 6 hours 05/20/12 1739 05/21/12 1010   05/20/12 0715   clindamycin (CLEOCIN) 900 mg, gentamicin (GARAMYCIN) 240 mg in sodium chloride 0.9 % 1,000 mL for intraperitoneal lavage         Intraperitoneal To Surgery 05/20/12 0706 05/20/12 1013   05/20/12 0515   metroNIDAZOLE (FLAGYL) IVPB 500 mg        500 mg 100 mL/hr over 60 Minutes Intravenous 60 min pre-op 05/20/12 0515 05/20/12 1419   05/20/12 0515   cefTRIAXone (ROCEPHIN) 2 g in dextrose 5 % 50 mL IVPB     Comments: Pharmacy may adjust dosing strength per protocol      2 g 100 mL/hr over 30 Minutes Intravenous On call to O.R. 05/20/12 0454 05/20/12 0820

## 2012-05-25 LAB — BASIC METABOLIC PANEL
CO2: 28 mEq/L (ref 19–32)
Calcium: 8.7 mg/dL (ref 8.4–10.5)
Chloride: 101 mEq/L (ref 96–112)
Glucose, Bld: 134 mg/dL — ABNORMAL HIGH (ref 70–99)
Potassium: 3.6 mEq/L (ref 3.5–5.1)
Sodium: 137 mEq/L (ref 135–145)

## 2012-05-25 LAB — GLUCOSE, CAPILLARY
Glucose-Capillary: 149 mg/dL — ABNORMAL HIGH (ref 70–99)
Glucose-Capillary: 171 mg/dL — ABNORMAL HIGH (ref 70–99)

## 2012-05-25 NOTE — Progress Notes (Signed)
5 Days Post-Op  Subjective: He looks and feels well.  No leakage.  Tolerating diet  Objective: Vital signs in last 24 hours: Temp:  [97.9 F (36.6 C)-98.4 F (36.9 C)] 97.9 F (36.6 C) (10/20 0430) Pulse Rate:  [65-86] 65  (10/20 0430) Resp:  [18] 18  (10/20 0430) BP: (113-129)/(52-78) 113/52 mmHg (10/20 0430) SpO2:  [98 %-99 %] 98 % (10/20 0430) Last BM Date: 05/24/12  Intake/Output from previous day: 10/19 0701 - 10/20 0700 In: 1325.8 [P.O.:480; I.V.:845.8] Out: 3275 [Urine:1975; Drains:150; Stool:1150] Intake/Output this shift: Total I/O In: 240 [P.O.:240] Out: -   General appearance: alert, cooperative and no distress Neck: incision looks good, no infection Resp: nonlabored Cardio: normal rate, regular GI: soft, minimal tenderness, ND, incisions okay.  ONQ pump removed.  JP ss output, he does have some more solid output in ileostomy, ostomy looks okay, no leakage  Lab Results:   Basename 05/23/12 0410  WBC 6.8  HGB 9.8*  HCT 30.8*  PLT 261   BMET  Basename 05/25/12 0445 05/24/12 0403  NA 137 137  K 3.6 3.6  CL 101 102  CO2 28 30  GLUCOSE 134* 121*  BUN 9 9  CREATININE 0.68 0.67  CALCIUM 8.7 8.5   PT/INR No results found for this basename: LABPROT:2,INR:2 in the last 72 hours ABG No results found for this basename: PHART:2,PCO2:2,PO2:2,HCO3:2 in the last 72 hours  Studies/Results: No results found.  Anti-infectives: Anti-infectives     Start     Dose/Rate Route Frequency Ordered Stop   05/20/12 2000   metroNIDAZOLE (FLAGYL) IVPB 500 mg        500 mg 100 mL/hr over 60 Minutes Intravenous Every 6 hours 05/20/12 1739 05/21/12 1010   05/20/12 0715   clindamycin (CLEOCIN) 900 mg, gentamicin (GARAMYCIN) 240 mg in sodium chloride 0.9 % 1,000 mL for intraperitoneal lavage         Intraperitoneal To Surgery 05/20/12 0706 05/20/12 1013   05/20/12 0515   metroNIDAZOLE (FLAGYL) IVPB 500 mg        500 mg 100 mL/hr over 60 Minutes Intravenous 60 min pre-op  05/20/12 0515 05/20/12 1419   05/20/12 0515   cefTRIAXone (ROCEPHIN) 2 g in dextrose 5 % 50 mL IVPB     Comments: Pharmacy may adjust dosing strength per protocol      2 g 100 mL/hr over 30 Minutes Intravenous On call to O.R. 05/20/12 0515 05/20/12 0820          Assessment/Plan: s/p Procedure(s) (LRB) with comments: COLON RESECTION LAPAROSCOPIC (N/A) - Laparoscopic Proctocolectomy, Ileal Pouch Anal Anastomoisis, Loop Ileostomy LOW ANTERIOR BOWEL RESECTION (Left) THYROID LOBECTOMY (Left) - LEFT THYROID LOBECTOMY continue regular diet.  Monitor ileostomy output.  Main issue will be ostomy care and instruction but may be okay for discharge tomorrow with ostomy instruction.  LOS: 5 days    Lodema Pilot DAVID 05/25/2012

## 2012-05-26 LAB — BASIC METABOLIC PANEL
BUN: 10 mg/dL (ref 6–23)
Calcium: 8.8 mg/dL (ref 8.4–10.5)
Creatinine, Ser: 0.74 mg/dL (ref 0.50–1.35)
GFR calc Af Amer: >90 mL/min (ref >90)
GFR calc non Af Amer: >90 mL/min (ref >90)
Glucose, Bld: 137 mg/dL — ABNORMAL HIGH (ref 70–99)

## 2012-05-26 NOTE — Care Management Note (Signed)
    Page 1 of 2   05/26/2012     10:24:30 AM   CARE MANAGEMENT NOTE 05/26/2012  Patient:  OAKLEN, GALLIHUGH   Account Number:  [id number]  Date Initiated:  05/21/2012  Documentation initiated by:  DAVIS,RHONDA  Subjective/Objective Assessment:   patient s/p surg low bp requiring iv volume challenge, sdu     Action/Plan:   home   Anticipated DC Date:  05/26/2012   Anticipated DC Plan:  HOME W HOME HEALTH SERVICES  In-house referral  NA      DC Planning Services  CM consult      [facility] Choice  HOME HEALTH   Choice offered to / List presented to:  C-1 Patient   DME arranged  NA      DME agency  NA     HH arranged  HH-1 RN      [facility] agency  [facility]   Status of service:  Completed, signed off Medicare Important Message given?   (If response is "NO", the following Medicare IM given date fields will be blank) Date Medicare IM given:   Date Additional Medicare IM given:    Discharge Disposition:  HOME W HOME HEALTH SERVICES  Per UR Regulation:  Reviewed for med. necessity/level of care/duration of stay  If discussed at Long Length of Stay Meetings, dates discussed:    Comments:  05-26-12 Lorenda Ishihara RN CM 1000 Spoke with patient at bedside regarding home care needs. Will need RN for drain and ostomy care. Wife states other family have used [facility] in the past and patient wanted to use them. Contacted Darl Pikes with [facility] to arrange. She will f/u with patient.  [id number] Earlene Plater, RN, BSN, CCM: CHART REVIEWED AND UPDATED. NO DISCHARGE NEEDS PRESENT AT THIS TIME. CASE MANAGEMENT [phone]

## 2012-05-26 NOTE — Discharge Summary (Signed)
Physician Discharge Summary  Patient ID: Cory Harper MRN: [id number] DOB/AGE: Jan 15, 1956 56 y.o.  Admit date: 05/20/2012 Discharge date: 05/26/2012  Admission Diagnoses: Principal Problem:  *Familial adenomatous polyposis coli Active Problems:  DIABETES MELLITUS, TYPE II  Obesity (BMI 30-39.9)  HYPERTENSION, BENIGN ESSENTIAL  Follicular lesion of left thyroid gland  Cecal cancer  Anal polyp s/p polypectomy  Discharge Diagnoses:  Principal Problem:  *Familial adenomatous polyposis coli Active Problems:  DIABETES MELLITUS, TYPE II  Obesity (BMI 30-39.9)  HYPERTENSION, BENIGN ESSENTIAL  Follicular lesion of left thyroid gland  Cecal cancer  Anal polyp s/p polypectomy   Discharged Condition: good  Hospital Course:   Postoperatively, the patient was placed on an anti-ileus protocol.  The patient mobilized and advanced to a solid diet gradually.  Pain was well-controlled and transitioned off IV medications.  He had ileostomy training - output was controlled with the help of occasional antidiarrheals  By the time of discharge, the patient was walking well the hallways, eating food well, having flatus.  Pain was-controlled on an oral regimen.  He had ileostomy training.  Based on meeting DC criteria and recovering well, I felt it was safe for the patient to be discharged home with close followup.  Instructions were discussed in detail.  They are written as well.   Consults: None  Significant Diagnostic Studies:   Treatments: surgery: PROCEDURE PERFORMED:  1. Laparoscopically-assisted total proctocolectomy.  2. Ileal pouch - anal anastomosis.  3. Diverting loop ileostomy.  4. Excision of anal canal polyp.  5. Left thyroid lobectomy.   Discharge Exam: Blood pressure 130/76, pulse 72, temperature 98.1 F (36.7 C), temperature source Oral, resp. rate 18, height 5\' 11"  (1.803 m), weight 267 lb 13.7 oz (121.5 kg), SpO2 99.00%.  General: Pt awake/alert/oriented x4 in no  acute distress  Eyes: PERRL, normal EOM. Sclera clear. No icterus  Neuro: CN II-XII intact w/o focal sensory/motor deficits.  Lymph: No head/neck/groin lymphadenopathy  Psych: No delerium/psychosis/paranoia  HENT: Normocephalic, Mucus membranes moist. No thrush  Neck: Supple, No tracheal deviation  Chest: No chest wall pain w good excursion  CV: Pulses intact. Regular rhythm  Abdomen: Soft. Morbidly obese. Nondistended. Mildly tender at incisions only. No incarcerated hernias. Ileostomy more pink dusky - gas / thick bilious effluent in bag  Ext: SCDs BLE. No mjr edema. No cyanosis  Skin: No petechiae / purpurae   Disposition: Final discharge disposition not confirmed  Discharge Orders    Future Appointments: Provider: Department: Dept Phone: Center:   06/02/2012 9:15 AM Ardeth Sportsman, MD [facility] [phone] None   06/19/2012 8:45 AM [facility] Chris[facility] Netters [phone] [facility]   06/24/2012 8:30 AM [name], MD [facility] [phone] [facility]     Future Orders Please Complete By Expires   Diet - low sodium heart healthy      Increase activity slowly          Medication List     As of 05/26/2012  7:44 AM    STOP taking these medications         polyethylene glycol powder powder   Commonly known as: GLYCOLAX/MIRALAX      TAKE these medications         cholecalciferol 1000 UNITS tablet   Commonly known as: VITAMIN D   Take 1,000 Units by mouth 2 (two) times daily at 10 AM and 5 PM.      CRESTOR 10 MG tablet   Generic drug: rosuvastatin   take 1 tablet  by mouth at bedtime      fluticasone 50 MCG/ACT nasal spray   Commonly known as: FLONASE   Place 2 sprays into the nose daily.      glimepiride 2 MG tablet   Commonly known as: AMARYL   Take 1 tablet (2 mg total) by mouth 2 (two) times daily.      lisinopril 5 MG tablet   Commonly known as: PRINIVIL,ZESTRIL   Take 5 mg by mouth at bedtime.      loperamide 2 MG tablet    Commonly known as: IMODIUM A-D   Take 1-2 tablets (2-4 mg total) by mouth 4 (four) times daily as needed for diarrhea or loose stools (use if emptying ileostomy more than 6x/day).      loratadine 10 MG tablet   Commonly known as: CLARITIN   Take 10 mg by mouth daily as needed.      metFORMIN 1000 MG tablet   Commonly known as: GLUCOPHAGE   Take 1 tablet (1,000 mg total) by mouth 2 (two) times daily with a meal.      ONGLYZA 5 MG Tabs tablet   Generic drug: saxagliptin HCl   Take 5 mg by mouth daily with supper.      oxyCODONE 5 MG immediate release tablet   Commonly known as: Oxy IR/ROXICODONE   Take 1-2 tablets (5-10 mg total) by mouth every 4 (four) hours as needed for pain.      vitamin B-12 1000 MCG tablet   Commonly known as: CYANOCOBALAMIN   Take 500 mcg by mouth daily.           Follow-up Information    Follow up with Kimika Streater C., MD. In 2 weeks. (As needed)    Contact information:   [address] [place] [address] [phone]          Signed: Ardeth Sportsman. 05/26/2012, 7:44 AM

## 2012-05-26 NOTE — Consult Note (Signed)
WOC ostomy consult  Stoma type/location: RUQ, loop ileostomy with support rod in place (red robinson cath)  Stomal assessment/size: edematous, pink and moist Peristomal assessment: 1 and 1/4 inches Treatment options for stomal/peristomal skin: ensure correct sizing. Output thickening brown stool Ostomy pouching: 1pc. With 2 flat aarrier rings  Education provided: demonstration of pouch change; fluid replacement, activity increases, reinforcement of emptying. Ready for discharge.  Established with [facility] post acute sampling program.  Supplies to be delivered on Wednesday. Patient and wife have my contact info. Thanks, Ladona Mow, MSN, RN, [facility], CWOCN [phone])

## 2012-05-29 ENCOUNTER — Ambulatory Visit: Payer: BC Managed Care – PPO | Admitting: Family Medicine

## 2012-05-31 ENCOUNTER — Emergency Department (HOSPITAL_COMMUNITY)
Admission: EM | Admit: 2012-05-31 | Discharge: 2012-06-01 | Disposition: A | Discharge status: Home / Self Care | Payer: BC Managed Care – PPO | Attending: Emergency Medicine | Admitting: Emergency Medicine

## 2012-05-31 ENCOUNTER — Encounter (HOSPITAL_COMMUNITY): Payer: Self-pay | Admitting: *Deleted

## 2012-05-31 DIAGNOSIS — Z860100 Personal history of colon polyps, unspecified: Secondary | ICD-10-CM | POA: Insufficient documentation

## 2012-05-31 DIAGNOSIS — R011 Cardiac murmur, unspecified: Secondary | ICD-10-CM | POA: Insufficient documentation

## 2012-05-31 DIAGNOSIS — E119 Type 2 diabetes mellitus without complications: Secondary | ICD-10-CM | POA: Insufficient documentation

## 2012-05-31 DIAGNOSIS — Y929 Unspecified place or not applicable: Secondary | ICD-10-CM | POA: Insufficient documentation

## 2012-05-31 DIAGNOSIS — E785 Hyperlipidemia, unspecified: Secondary | ICD-10-CM | POA: Insufficient documentation

## 2012-05-31 DIAGNOSIS — Z87891 Personal history of nicotine dependence: Secondary | ICD-10-CM | POA: Insufficient documentation

## 2012-05-31 DIAGNOSIS — Z432 Encounter for attention to ileostomy: Secondary | ICD-10-CM | POA: Insufficient documentation

## 2012-05-31 DIAGNOSIS — T8140XA Infection following a procedure, unspecified, initial encounter: Secondary | ICD-10-CM

## 2012-05-31 DIAGNOSIS — X58XXXA Exposure to other specified factors, initial encounter: Secondary | ICD-10-CM | POA: Insufficient documentation

## 2012-05-31 DIAGNOSIS — I10 Essential (primary) hypertension: Secondary | ICD-10-CM | POA: Insufficient documentation

## 2012-05-31 DIAGNOSIS — Y939 Activity, unspecified: Secondary | ICD-10-CM | POA: Insufficient documentation

## 2012-05-31 DIAGNOSIS — Z79899 Other long term (current) drug therapy: Secondary | ICD-10-CM | POA: Insufficient documentation

## 2012-05-31 NOTE — ED Notes (Signed)
Pt had ileostomy placed on 10/15; went home 10/21; c/o leaking around ostomy site that began on 10/25 and has worsened today; pt also c/o irritation to the skin around the area; Upon arrival greenish brown bile drainage noted around ostomy pouch and down abdomen; pt denies N/V or abd pain.

## 2012-05-31 NOTE — ED Provider Notes (Signed)
History     CSN: [id number]  Arrival date & time 05/31/12  2045   First MD Initiated Contact with Patient 05/31/12 2145      Chief Complaint  Patient presents with  . Post-op Problem    (Consider location/radiation/quality/duration/timing/severity/associated sxs/prior treatment) HPI  Pt to the ER with complaints of ostomy bag leaking which has made his skin irritated and he is now having a hard time getting his Holster Bag to stick to his skin. He called his home health nurse who says that he may need and [place] bag but they can not come till Monday and he doesn't feel as though he can wait that long. He had a cecal to rectal anastomosis after the removal of his colon for suspected cancer. Dr. Michaell Cowing did the surgery. He has not have any systemic symptoms of nausea, vomiting fevers or abdominal pains. nad vss  Past Medical History  Diagnosis Date  . Diabetes mellitus   . Colon polyps     he had declined f/u colonoscopy after inital colonoscopy  . Hypertension   . Arthritis   . Nasal congestion   . Colonic mass   . Hyperlipidemia   . Heart murmur     Past Surgical History  Procedure Date  . Knee arthroscopy 1998    Right knee  . Upper gastrointestinal endoscopy   . Wisdom tooth extraction   . Colon resection 05/20/2012    Procedure: COLON RESECTION LAPAROSCOPIC;  Surgeon: Ardeth Sportsman, MD;  Location: [facility];  Service: General;  Laterality: N/A;  Laparoscopic Proctocolectomy, Ileal Pouch Anal Anastomoisis, Loop Ileostomy  . Bowel resection 05/20/2012    Procedure: LOW ANTERIOR BOWEL RESECTION;  Surgeon: Ardeth Sportsman, MD;  Location: [facility];  Service: General;  Laterality: Left;  . Thyroid lobectomy 05/20/2012    Procedure: THYROID LOBECTOMY;  Surgeon: Ardeth Sportsman, MD;  Location: [facility];  Service: General;  Laterality: Left;  LEFT THYROID LOBECTOMY  . Ileostomy 05/20/12    Family History  Problem Relation Age of Onset  . Uterine cancer Mother   . Hypertension  Mother   . Stroke Mother   . Colon cancer Mother   . Colon polyps Mother   . Cancer Mother     colon, endometrial  . Diabetes Father   . Obesity Father   . Pneumonia Father   . Cancer Father     skin - squamous   . Diabetes Paternal Grandmother   . Prostate cancer Neg Hx     History  Substance Use Topics  . Smoking status: Former Smoker    Quit date: 08/07/1991  . Smokeless tobacco: Never Used   Comment: quit in 1993  . Alcohol Use: No      Review of Systems   Review of Systems  Gen: no weight loss, fevers, chills, night sweats  Eyes: no discharge or drainage, no occular pain or visual changes  Nose: no epistaxis or rhinorrhea  Mouth: no dental pain, no sore throat  Neck: no neck pain  Lungs:No wheezing, coughing or hemoptysis CV: no chest pain, palpitations, dependent edema or orthopnea  Abd: no abdominal pain, nausea, vomiting, ileostomy skin irritation  GU: no dysuria or gross hematuria  MSK:  No abnormalities  Neuro: no headache, no focal neurologic deficits  Skin: no abnormalities Psyche: negative.    Allergies  Ibuprofen  Home Medications   Current Outpatient Rx  Name Route Sig Dispense Refill  . ACETAMINOPHEN 500 MG PO TABS Oral Take 1,000  mg by mouth every 6 (six) hours as needed. For pain    . VITAMIN D 1000 UNITS PO TABS Oral Take 1,000 Units by mouth 2 (two) times daily at 10 AM and 5 PM.     . CRESTOR 10 MG PO TABS  take 1 tablet by mouth at bedtime 30 tablet 11    Dispense as written.  Marland Kitchen FLUTICASONE PROPIONATE 50 MCG/ACT NA SUSP Nasal Place 2 sprays into the nose daily. 16 g 12  . GLIMEPIRIDE 2 MG PO TABS Oral Take 1 tablet (2 mg total) by mouth 2 (two) times daily. 60 tablet 12  . LISINOPRIL 5 MG PO TABS Oral Take 5 mg by mouth at bedtime.     Marland Kitchen LOPERAMIDE HCL 2 MG PO TABS Oral Take 1-2 tablets (2-4 mg total) by mouth 4 (four) times daily as needed for diarrhea or loose stools (use if emptying ileostomy more than 6x/day). 40 tablet 2  .  LORATADINE 10 MG PO TABS Oral Take 10 mg by mouth daily as needed.    Marland Kitchen METFORMIN HCL 1000 MG PO TABS Oral Take 1 tablet (1,000 mg total) by mouth 2 (two) times daily with a meal. 60 tablet 12  . SAXAGLIPTIN HCL 5 MG PO TABS Oral Take 5 mg by mouth daily with supper.     Marland Kitchen VITAMIN B-12 1000 MCG PO TABS Oral Take 500 mcg by mouth daily.     . OXYCODONE HCL 5 MG PO TABS Oral Take 1-2 tablets (5-10 mg total) by mouth every 4 (four) hours as needed for pain. 50 tablet 0    BP 120/69  Pulse 84  Temp 98.3 F (36.8 C) (Oral)  Resp 16  Ht 5\' 11"  (1.803 m)  Wt 262 lb (118.842 kg)  BMI 36.54 kg/m2  SpO2 97%  Physical Exam  Nursing note and vitals reviewed. Constitutional: He appears well-developed and well-nourished. No distress.  HENT:  Head: Normocephalic and atraumatic.  Eyes: Pupils are equal, round, and reactive to light.  Neck: Normal range of motion. Neck supple.  Cardiovascular: Normal rate and regular rhythm.   Pulmonary/Chest: Effort normal.  Abdominal: Soft.    Neurological: He is alert.  Skin: Skin is warm and dry.    ED Course  Procedures (including critical care time)  Labs Reviewed - No data to display No results found.   1. Post op infection       MDM  We changed bag to an [employer]. Pt advised to consult with Dr. Michaell Cowing on Monday and to have Home health come on Monday as well for reassessment.  No systemic symptoms present to suggest infection.  Pt has been advised of the symptoms that warrant their return to the ED. Patient has voiced understanding and has agreed to follow-up with the PCP or specialist.         Dorthula Matas, PA 05/31/12 2350

## 2012-06-01 NOTE — ED Provider Notes (Signed)
Medical screening examination/treatment/procedure(s) were performed by non-physician practitioner and as supervising physician I was immediately available for consultation/collaboration.  Suzanne Garbers, MD 06/01/12 0450 

## 2012-06-02 ENCOUNTER — Encounter (INDEPENDENT_AMBULATORY_CARE_PROVIDER_SITE_OTHER): Payer: BC Managed Care – PPO | Admitting: Surgery

## 2012-06-02 ENCOUNTER — Telehealth (INDEPENDENT_AMBULATORY_CARE_PROVIDER_SITE_OTHER): Payer: Self-pay

## 2012-06-02 NOTE — Telephone Encounter (Addendum)
Pt's wife calling with concern for skin breakdown and inability for ostomy pouch to stay on.  He will not get out of the chair because of the leakage.  He will not eat well because of the leakage.  She is very concerned with husband's overall health.  Appt is on 06/04/12.  Explained to wife that the home health nurse has already called and we are waiting to hear from Dr. Michaell Cowing.  She understands.

## 2012-06-02 NOTE — Telephone Encounter (Signed)
Cory Harper from [facility] called stating the pt is having a lot of skin irritation below ileostomy site due to leakage. They have tried multiple things and are also in communication with [facility] ostomy nurse. Cory Harper wanted to know if using a low pressure wound vac on a very low setting could be an option. I advised her Dr Michaell Cowing would have to review this and determine if this would be safe. Pt has appt 06-04-12 with Dr Michaell Cowing. I advised her I will send msg to Dr Michaell Cowing and ask him or his assistant to call her after the area has been seen by Dr Jerelyn Charles. She can be reached thru [facility] [phone].

## 2012-06-02 NOTE — Telephone Encounter (Signed)
Britta Mccreedy spoke with patient's wife again. He is not eating or drinking. He will not get up and move around because his ostomy is leaking. Dr Michaell Cowing advised to see Page Spiro with [facility] medical about the ostomy. If patient will not eat or drink he will need to go to the ER for IV fluids and evaluation. Patient's wife made aware per Britta Mccreedy. They will follow up with Olegario Messier and go to the ER if needed.

## 2012-06-03 ENCOUNTER — Telehealth: Payer: Self-pay | Admitting: Oncology

## 2012-06-03 NOTE — Telephone Encounter (Signed)
S/W pt he states he will call back on tomorrow after speaking w/Dr. Michaell Cowing.

## 2012-06-04 ENCOUNTER — Ambulatory Visit (INDEPENDENT_AMBULATORY_CARE_PROVIDER_SITE_OTHER): Payer: BC Managed Care – PPO | Admitting: Surgery

## 2012-06-04 ENCOUNTER — Encounter (INDEPENDENT_AMBULATORY_CARE_PROVIDER_SITE_OTHER): Payer: Self-pay | Admitting: Surgery

## 2012-06-04 VITALS — BP 128/60 | HR 80 | Temp 97.0°F | Resp 16 | Ht 71.0 in | Wt 237.0 lb

## 2012-06-04 DIAGNOSIS — K620 Anal polyp: Secondary | ICD-10-CM

## 2012-06-04 DIAGNOSIS — D497 Neoplasm of unspecified behavior of endocrine glands and other parts of nervous system: Secondary | ICD-10-CM

## 2012-06-04 DIAGNOSIS — Z932 Ileostomy status: Secondary | ICD-10-CM | POA: Insufficient documentation

## 2012-06-04 DIAGNOSIS — C180 Malignant neoplasm of cecum: Secondary | ICD-10-CM

## 2012-06-04 DIAGNOSIS — K621 Rectal polyp: Secondary | ICD-10-CM

## 2012-06-04 DIAGNOSIS — D126 Benign neoplasm of colon, unspecified: Secondary | ICD-10-CM

## 2012-06-04 DIAGNOSIS — K9413 Enterostomy malfunction: Secondary | ICD-10-CM

## 2012-06-04 LAB — COMPREHENSIVE METABOLIC PANEL
BUN: 32 mg/dL — ABNORMAL HIGH (ref 6–23)
CO2: 23 mEq/L (ref 19–32)
Calcium: 9.5 mg/dL (ref 8.4–10.5)
Chloride: 91 mEq/L — ABNORMAL LOW (ref 96–112)
Creat: 1.16 mg/dL (ref 0.50–1.35)
Glucose, Bld: 217 mg/dL — ABNORMAL HIGH (ref 70–99)

## 2012-06-04 LAB — CBC
HCT: 39.2 % (ref 39.0–52.0)
MCV: 85.4 fL (ref 78.0–100.0)
RBC: 4.59 MIL/uL (ref 4.22–5.81)
WBC: 11.2 10*3/uL — ABNORMAL HIGH (ref 4.0–10.5)

## 2012-06-04 NOTE — Progress Notes (Signed)
Subjective:     Patient ID: Cory Harper, male   DOB: September 09, 1955, 56 y.o.   MRN: [id number]  HPI  Cory Harper  January 11, 1956 [id number]  Patient Care Team: [name], MD as PCP - General (Family Medicine) Rachael Fee, MD as Consulting Physician (Gastroenterology) [name], MD as Consulting Physician (General Surgery)  This patient is a 56 y.o.male who presents today for surgical evaluation.   PROCEDURE PERFORMED 05/20/2012 1. Laparoscopically-assisted total proctocolectomy.  2. Ileal pouch - anal anastomosis.  3. Diverting loop ileostomy.  4. Excision of anal canal polyp.  5. Left thyroid lobectomy.    The patient comes in today with his wife feeling okay.  Initially did well at home.  Then began to have leaking of the ileostomy bag.  It is been a struggle the past few days.  Went to outpatient ostomy clinic.  Getting new packages sent.  Using Imodium three times a day.  Consistency thick.  Wife concerned he is not eating or drinking enough.  He feels like he is doing okay.  No fevers or chills.  Urinating fine.  No lightheadedness or dizziness.  Walking 20 minutes a couple times a day.  Drain output is more than and volume going down slowly.  Mild perianal irritation.  No drainage.  No blood.  Noticed started getting a little course on his throat while he was home.  Not severe.  No shortness of breath.  No difficulty with swallowing.    Patient Active Problem List  Diagnosis  . DIABETES MELLITUS, TYPE II  . Obesity (BMI 30-39.9)  . HYPERTENSION, BENIGN ESSENTIAL  . ECZEMA  . Familial adenomatous polyposis coli  . VITAMIN B12 DEFICIENCY  . UNSPECIFIED VITAMIN D DEFICIENCY  . Routine general medical examination at a health care facility  . Pure hypercholesterolemia  . 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 Cecal cancer  . Anal polyp s/p polypectomy  . Ileostomy dysfunction - leaking    Past Medical History  Diagnosis Date  . Diabetes  mellitus   . Colon polyps     he had declined f/u colonoscopy after inital colonoscopy  . Hypertension   . Arthritis   . Nasal congestion   . Colonic mass   . Hyperlipidemia   . Heart murmur     Past Surgical History  Procedure Date  . Knee arthroscopy 1998    Right knee  . Upper gastrointestinal endoscopy   . Wisdom tooth extraction   . Colon resection 05/20/2012    Procedure: COLON RESECTION LAPAROSCOPIC;  Surgeon: Ardeth Sportsman, MD;  Location: [facility];  Service: General;  Laterality: N/A;  Laparoscopic Proctocolectomy, Ileal Pouch Anal Anastomoisis, Loop Ileostomy  . Bowel resection 05/20/2012    Procedure: LOW ANTERIOR BOWEL RESECTION;  Surgeon: Ardeth Sportsman, MD;  Location: [facility];  Service: General;  Laterality: Left;  . Thyroid lobectomy 05/20/2012    Procedure: THYROID LOBECTOMY;  Surgeon: Ardeth Sportsman, MD;  Location: [facility];  Service: General;  Laterality: Left;  LEFT THYROID LOBECTOMY  . Ileostomy 05/20/12  . Colon surgery     History   Social History  . Marital Status: Married    Spouse Name: N/A    Number of Children: 0  . Years of Education: N/A   Occupational History  . 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 Smoking status: Former Smoker    Quit date: 08/07/1991  . Smokeless tobacco: Never Used   Comment: quit in 1993  . Alcohol Use: No  . Drug Use: No  . Sexually Active: Not on file   Other Topics Concern  . 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 Uterine cancer Mother   . Hypertension Mother   . Stroke Mother   . Colon cancer Mother   . Colon polyps Mother   . Cancer Mother     colon, endometrial  . Diabetes Father   . Obesity Father   . Pneumonia Father   . Cancer Father     skin -  squamous   . Diabetes Paternal Grandmother   . Prostate cancer Neg Hx     Current Outpatient Prescriptions  Medication Sig Dispense Refill  . ACCU-CHEK AVIVA PLUS test strip       . acetaminophen (TYLENOL) 500 MG tablet Take 1,000 mg by mouth every 6 (six) hours as needed. For pain      . cholecalciferol (VITAMIN D) 1000 UNITS tablet Take 1,000 Units by mouth 2 (two) times daily at 10 AM and 5 PM.       . CRESTOR 10 MG tablet take 1 tablet by mouth at bedtime  30 tablet  11  . fluticasone (FLONASE) 50 MCG/ACT nasal spray Place 2 sprays into the nose daily.  16 g  12  . glimepiride (AMARYL) 2 MG tablet Take 1 tablet (2 mg total) by mouth 2 (two) times daily.  60 tablet  12  . lisinopril (PRINIVIL,ZESTRIL) 5 MG tablet Take 5 mg by mouth at bedtime.       Marland Kitchen loperamide (IMODIUM A-D) 2 MG tablet Take 1-2 tablets (2-4 mg total) by mouth 4 (four) times daily as needed for diarrhea or loose stools (use if emptying ileostomy more than 6x/day).  40 tablet  2  . loratadine (CLARITIN) 10 MG tablet Take 10 mg by mouth daily as needed.      . metFORMIN (GLUCOPHAGE) 1000 MG tablet Take 1 tablet (1,000 mg total) by mouth 2 (two) times daily with a meal.  60 tablet  12  . oxyCODONE (OXY IR/ROXICODONE) 5 MG immediate release tablet Take 1-2 tablets (5-10 mg total) by mouth every 4 (four) hours as needed for pain.  50 tablet  0  . saxagliptin HCl (ONGLYZA) 5 MG TABS tablet Take 5 mg by mouth daily with supper.       . vitamin B-12 (CYANOCOBALAMIN) 1000 MCG tablet Take 500 mcg by mouth daily.          Allergies  Allergen Reactions  . Ibuprofen     Joint swelling    BP 128/60  Pulse 80  Temp 97 F (36.1 C) (Temporal)  Resp 16  Ht 5\' 11"  (1.803 m)  Wt 237 lb (107.502 kg)  BMI 33.05 kg/m2  REPORT OF SURGICAL PATHOLOGY FINAL DIAGNOSIS Diagnosis 1. Colon, segmental resection for tumor - COLONIC ADENOCARCINOMA EXTENDING INTO THE PERICECAL CONNECTIVE TISSUE. - MARGINS NOT INVOLVED. - FORTY-NINE  BENIGN LYMPH NODES (0/49). - MULTIPLE ADENOMATOUS POLYPS. - BENIGN APPENDIX WITH FIBROUS OBLITERATION OF THE LUMEN. 2. Rectum, resection, stitch marks proximal end - ADENOMATOUS POLYPS. - MARGINS NOT INVOLVED. - FIVE BENIGN LYMPH NODES (0/5). 3. Polyp, anal - BENIGN ANORECTAL POLYP. -  NO ADENOMATOUS CHANGE, DYSPLASIA OR MALIGNANCY. 4. Colon, resection margin (donut), blue stitch ileol pouch: non-stitch distal rectum - BENIGN ILEUM. - BENIGN ANORECTAL TISSUE. - NO ADENOMATOUS CHANGE OR MALIGNANCY. 5. Thyroid, lobectomy, left - HURTHLE CELL NEOPLASM. - MARGINS NOT INVOLVED. Microscopic Comment 1. COLON AND RECTUM Specimen: Abdominal colon Procedure: Resection Tumor site: Cecum at level of ileocecal valve Specimen integrity: Intact Macroscopic tumor perforation: No Invasive tumor: Maximum size: 5 cm Histologic type(s): Colorectal adenocarcinoma with partial mucinous features 1 of 3 FINAL for BRALYN, FOLKERT ([id number]) Microscopic Comment(continued) Histologic grade and differentiation: G2: moderately differentiated/low grade Microscopic extension of invasive tumor: Into pericecal connective tissue Lymph-Vascular invasion: Not identified Peri-neural invasion: Not identified Tumor deposit(s) (discontinuous extramural extension): No Resection margins: Proximal margin: Free of tumor Distal margin: Free of tumor Circumferential (radial) (posterior ascending, posterior descending; lateral and posterior mid-rectum; and entire lower 1/3 rectum): Free of tumor Treatment effect (neoadjuvant therapy): No Number of lymph nodes examined 49 (plus 5 benign nodes form part 2, rectum) ; Number positive 0 Additional polyp(s): Numerous adenomatous polyps Non-neoplastic findings: Fibrous obliteration of appendiceal lumen Pathologic Staging: pT3, pN0, pMX Ancillary studies: Can be performed if requested. Case discussed with Dr. Michaell Cowing on 05/23/12. (JDP:kh 05-22-12) 5. The nodule is composed  of follicules which are lined by Hurthle cells. The peripheral capsule is variable and focally inapparent. No areas of capsular or vascular invasion are identified and the findings are consistent with low grade Hurthle cell neoplasm. Dr. Colonel Bald has reviewed part 5 of this case and agrees. Jimmy Picket MD Pathologist, Electronic Signature (Case signed 05/26/2012) Specimen Zanasia Hickson and Clinical Information Specimen(s) Obtained: 1. Colon, segmental resection for tumor 2. Rectum, resection, stitch marks proximal end 3. Polyp, anal 4. Colon, resection margin (donut), blue stitch ileol pouch: non-stitch distal rectum 5. Thyroid, lobectomy, left Specimen Clinical Information 1. cecal mass, FAP, thyroid nodule, (je) 5. hx of thyroid mass (je) Avis Mcmahill 1. Specimen: Abdominal colon, received in formalin Specimen integrity: Intact Specimen length: The specimen includes 4 cm of terminal ileum and 118 cm of colon Tumor location: The primary lesion is located in the cecum at the level of the ileocecal valve Tumor size: The primary lesion consists of a sessile, ulcerated red brown mass measuring 5 x 4.2 x 1.2 cm Percent of bowel circumference involved: 70% Tumor distance to margins: Proximal: 4 cm Distal: 109 cm Macroscopic extent of tumor invasion: Tumor invades through the muscularis propria into the subserosal adipose tissue or the non-peritonealized pericolic or perirectal soft tissues but does not involve the visceral (serosa): 2 of 3 FINAL for ABE, SCHOOLS ([id number]) Hoorain Kozakiewicz(continued) Total presumed lymph nodes: There are 52 rubbery tan red ovoid nodules tentatively identified as lymph nodes varying in size from 0.3 to 1.4 cm in greatest dimension Extramural satellite tumor nodules: Not grossly identified Mucosal polyp(s): There are innumerable polyps present throughout the entire segment varying in size from 0.2 to 1.2 cm. The cecum and proximal ascending colon is virtually carpeted with  polyps. The polyps grossly appear to be superficial without invasion grossly identified. Additional findings: The remainder of the uninvolved mucosa is glistening and tan. The appendix is present and measures 6.8 cm in length x 0.7 cm in diameter. Block summary: 29 blocks submitted A = proximal margin B = distal margin C = proximal cecal tumor to uninvolved mucosa transition D = distal cecal tumor to uninvolved mucosa transition E, F = deep cecal extension G = tissue for molecular testing H - R = sections  of polyps sequentially labeled from proximal to distal S = appendix T - Z = 38 lymph nodes from proximal half of segment ZA, ZB, ZC = 14 lymph nodes from distal half of segment (GRP:caf 05/21/12) 2. Received in formalin is a previously opened segment of colon, clinically rectum, which measures 10 cm in length. The attached mesorectum is intact. There is a suture attached marking the distal margin. The mucosa is glistening and tan. There are six sessile soft tan mucosal polyps present measuring 0.4 to 0.6 cm in greatest dimension. The polyps extend to within 1 cm of the proximal margin and 1 cm of the distal margin. Within the attached fat there are eight rubbery tan red ovoid nodules tentatively identified as lymph nodes measuring 0.3 to 0.7 cm in greatest dimension. Sections are submitted in seven cassettes. A = proximal margin B = distal margin C, D, E = polyps F, G = lymph nodes 3. Received in formalin is a 1 x 0.5 x 0.5 cm rubbery tan polypoid portion of mucosa. The base is inked and the specimen is sectioned and entirely submitted in one cassette. 4. Received in formalin are two intestinal anastomotic rings, one of which is identified as the ileal pouch with a blue suture. The ring without a suture is identified as distal rectum. Each piece measures 2 cm in diameter and each is 1.2 cm in length. The mucosa is glistening and tan. Sections are submitted in two cassettes. A =  sections from ileal ring B = sections from rectal ring 5. Received in formalin is a 4.6 x 2.7 x 2.1 cm, 8.5 gram portion of thyroid, clinically left lobe. There is a suture attached marking the superior aspect. The margins are inked prior to sectioning. At the inferior pole there is a 2.5 x 1.7 x 1.5 cm well circumscribed rubbery ovoid nodule. The cut surface of the nodule is mottled tan red to hemorrhagic. The surrounding parenchyma is homogeneous red brown. Sections are submitted in seven cassettes. A = isthmus margin B - G = entire nodule (GRP:caf 05/21/12) Report signed out from the following location(s) [facility] [address], [place], [place] [address]. CLIA #: [id number],   Review of Systems  Constitutional: Negative for fever, chills, diaphoresis, appetite change and fatigue.  HENT: Positive for voice change. Negative for sore throat, mouth sores, trouble swallowing and neck pain.   Eyes: Negative for photophobia and visual disturbance.  Respiratory: Negative for choking and shortness of breath.   Cardiovascular: Negative for chest pain and palpitations.  Gastrointestinal: Negative for nausea, vomiting, abdominal pain, constipation, blood in stool, abdominal distention, anal bleeding and rectal pain.  Genitourinary: Negative for dysuria, urgency, difficulty urinating and testicular pain.  Musculoskeletal: Negative for myalgias, arthralgias and gait problem.  Skin: Positive for rash. Negative for color change.  Neurological: Negative for dizziness, syncope, speech difficulty, weakness, light-headedness, numbness and headaches.  Hematological: Negative for adenopathy.  Psychiatric/Behavioral: Negative for hallucinations, confusion and agitation.       Objective:   Physical Exam  Constitutional: He is oriented to person, place, and time. He appears well-developed and well-nourished. No distress.  HENT:  Head: Normocephalic.  Mouth/Throat: Uvula is midline,  oropharynx is clear and moist and mucous membranes are normal. No oropharyngeal exudate, posterior oropharyngeal edema or posterior oropharyngeal erythema.       Mild hoarseness  Eyes: Conjunctivae normal and EOM are normal. Pupils are equal, round, and reactive to light. No scleral icterus.  Neck: Normal range of  motion. No tracheal deviation present.    Cardiovascular: Normal rate, normal heart sounds and intact distal pulses.   Pulmonary/Chest: Effort normal. No respiratory distress.  Abdominal: Soft. He exhibits no distension and no mass. There is no tenderness. There is no rebound, no guarding and no CVA tenderness. No hernia. Hernia confirmed negative in the right inguinal area and confirmed negative in the left inguinal area.         Incisions clean with normal healing ridges.  No hernias  Musculoskeletal: Normal range of motion. He exhibits no tenderness.  Neurological: He is alert and oriented to person, place, and time. No cranial nerve deficit. He exhibits normal muscle tone. Coordination normal.  Skin: Skin is warm and dry. No rash noted. He is not diaphoretic.  Psychiatric: He has a normal mood and affect. His behavior is normal.       Assessment:     Two weeks status post abdominal colectomy ileal pouch and diverting loop ileostomy and left thyroid lobectomy.  Recovering relatively well except for leaking at stoma.    Plan:     Increase activity as tolerated to regular activity.  Do not push through pain.  Diet as tolerated. Bowel regimen to avoid problems.  Continue Imodium 3 times a day as that seems to keep the consistency well.  Continue outpatient ostomy care through [facility] supply.  I gave further handouts/packages on ileostomy care  I stressed proper nutrition.  Stay hydrated with plenty of liquids to avoid dehydration.  I stressed some supplemental shakes as well.  CBC, CMET.  Evaluate rule out anemia/renal insufficiency/electrolyte  abnormalities/nutritional issues  Followup with medical oncology to see if post adjuvant chemotherapy would be of benefit.  Keep stoma for at least three months.  Probable enema/flexible endoscopy evaluation to make sure pouch is well healed prior to ileostomy takedown.  Will discuss pathology on Hurthle cell neoplasm.  Return to clinic 2 weeks.   Instructions discussed.  Followup with primary care physician for other health issues as would normally be done.  Questions answered.  The patient expressed understanding and appreciation

## 2012-06-04 NOTE — Patient Instructions (Addendum)
Ostomy Support Information An ostomy is an opening in the belly (abdominal wall) made by surgery. Ostomates are people who have had this procedure. The opening (stoma) allows the kidney or bowel to discharge waste. An external pouch covers the stoma to collect waste. Pouches are are a simple bag and are odor free. Different companies have disposable or reusable pouches to fit one's lifestyle. An ostomy can either be temporary or permanent.  THERE ARE THREE MAIN TYPES OF OSTOMIES  Colostomy. A colostomy is a surgically created opening in the large intestine (colon).  Ileostomy. An ileostomy is a surgically created opening in the small intestine.  Urostomy. A urostomy is a surgically created opening to divert urine away from the bladder. FREQUENTLY ASKED QUESTIONS Will I need to be on a special diet? Most people return to their normal diet when they have recovered from surgery. Be sure to chew your food well, eat a well-balanced diet and drink plenty of fluids. If you experience problems with a certain food, wait a couple of weeks and try it again. Will there be odor and noises? Pouching systems are designed to be odor-proof or odor-resistant. There are deodorants that can be used in the pouch. Medications are also available to help reduce odor. Limit gas-producing foods and carbonated beverages. You will experience less gas and fewer noises as you heal from surgery. How much time will it take to care for my ostomy? At first, you may spend a lot of time learning about your ostomy and how to take care of it. As you become more comfortable and skilled at changing the pouching system, it will take very little time to care for it.  Will I be able to return to work? People with ostomies can perform most jobs. As soon as you have healed from surgery, you should be able to return to work. Heavy lifting (more than 10 pounds) may be discouraged.  What about intimacy? Sexual relationships and intimacy are  important and fulfilling aspects of your life. They should continue after ostomy surgery. Intimacy-related concerns should be discussed openly between you and your partner.  Can I wear regular clothing? You do not need to wear special clothing. Ostomy pouches are fairly flat and barely noticeable. Elastic undergarments will not hurt the stoma or prevent the ostomy from functioning.  Can I participate in sports? An ostomy should not limit your involvement in sports. Many people with ostomies are runners, skiers, swimmers or participate in other active lifestyles. Talk with your caregiver first before doing heavy physical activity. PROFESSIONAL HELP  Resources are available if you need help or have questions about your ostomy.   Specially trained nurses called Wound, Ostomy Continence Nurses (WOCN) are available for consultation in most major medical centers.  The United Ostomy Association (UOA) is a group made up of many local chapters throughout the [place]. These local groups hold meetings and provide support to prospective and existing ostomates. They sponsor educational events and have qualified visitors to make personal or telephone visits. Contact the UOA for the chapter nearest you and for other educational publications.  More detailed information can be found in Colostomy Guide, a publication of the United Ostomy Association (UOA). Contact UOA at 1-800-826-0826 or visit their web site at www.uoaa.org. The website contains links to other sites, suppliers and resources. Document Released: 07/26/2003 Document Revised: 10/15/2011 Document Reviewed: 11/24/2008 ExitCare Patient Information 2013 [employer].  

## 2012-06-05 ENCOUNTER — Telehealth: Payer: Self-pay | Admitting: Oncology

## 2012-06-05 NOTE — Telephone Encounter (Signed)
C/D 06/05/12 for appt 06/16/12

## 2012-06-05 NOTE — Telephone Encounter (Signed)
S/W pt in re NP appt on 11/11 w/Dr. Truett Perna.  Welcome packet mailed.

## 2012-06-06 ENCOUNTER — Telehealth (INDEPENDENT_AMBULATORY_CARE_PROVIDER_SITE_OTHER): Payer: Self-pay | Admitting: General Surgery

## 2012-06-06 DIAGNOSIS — Z9889 Other specified postprocedural states: Secondary | ICD-10-CM

## 2012-06-06 DIAGNOSIS — C180 Malignant neoplasm of cecum: Secondary | ICD-10-CM

## 2012-06-06 NOTE — Telephone Encounter (Signed)
Message copied by Liliana Cline on Fri Jun 06, 2012  8:19 AM ------      Message from: Ardeth Sportsman      Created: Fri Jun 06, 2012  7:34 AM       Repeat BMET Monday AM - have pt drink plenty of liquids.  No extra potassium supplements

## 2012-06-06 NOTE — Telephone Encounter (Signed)
Patient made aware. Will drink plenty of fluids over the weekend and have labs drawn Monday morning.

## 2012-06-06 NOTE — Telephone Encounter (Signed)
Message copied by Liliana Cline on Fri Jun 06, 2012  9:22 AM ------      Message from: Cathi Roan      Created: Fri Jun 06, 2012  9:17 AM      Regarding: Non Urgent Message      Contact: [phone]       Amy with [facility] calling to get order for 1 more visit. If Dr. Michaell Cowing is agreeable per patient request.

## 2012-06-06 NOTE — Telephone Encounter (Signed)
Called Amy and made her aware it is okay to see patient again. She will call us if she needs anything else.

## 2012-06-09 ENCOUNTER — Ambulatory Visit: Payer: BC Managed Care – PPO

## 2012-06-09 ENCOUNTER — Inpatient Hospital Stay: Payer: BC Managed Care – PPO | Admitting: Oncology

## 2012-06-09 ENCOUNTER — Telehealth (INDEPENDENT_AMBULATORY_CARE_PROVIDER_SITE_OTHER): Payer: Self-pay | Admitting: General Surgery

## 2012-06-09 LAB — BASIC METABOLIC PANEL
CO2: 25 mEq/L (ref 19–32)
Calcium: 9.2 mg/dL (ref 8.4–10.5)
Chloride: 83 mEq/L — ABNORMAL LOW (ref 96–112)
Potassium: 5.8 mEq/L — ABNORMAL HIGH (ref 3.5–5.3)
Sodium: 116 mEq/L — ABNORMAL LOW (ref 135–145)

## 2012-06-09 NOTE — Telephone Encounter (Signed)
Patient stated this weekend he started having some problems with gas and heartburn. He called the on call MD and spoke with Dr Ezzard Standing who advised he try Mylanta. Patient states this is helping. I made him aware that if this is working to keep doing it. He will call if he gets worse.

## 2012-06-10 ENCOUNTER — Telehealth (INDEPENDENT_AMBULATORY_CARE_PROVIDER_SITE_OTHER): Payer: Self-pay | Admitting: General Surgery

## 2012-06-10 DIAGNOSIS — E860 Dehydration: Secondary | ICD-10-CM

## 2012-06-10 DIAGNOSIS — Z9049 Acquired absence of other specified parts of digestive tract: Secondary | ICD-10-CM

## 2012-06-10 NOTE — Telephone Encounter (Signed)
Called [facility] and was instructed to fax additional requested orders to [phone]. Order faxed to [facility] and confirmation received.

## 2012-06-10 NOTE — Telephone Encounter (Signed)
Message copied by Liliana Cline on Tue Jun 10, 2012  8:01 AM ------      Message from: Ardeth Sportsman      Created: Tue Jun 10, 2012  7:38 AM       pls set up home health 2 liters IVF infusion boluses at home today & Thursday (2L each day)            Repeat labs tomorrow            Encourage Gatorade intake            If emptying > 6 times a day from ileostomy, then increase imodium to QID                  ----- Message -----         From: [facility]         Sent: 06/09/2012   8:11 PM           To: Ardeth Sportsman, MD

## 2012-06-10 NOTE — Telephone Encounter (Signed)
Thanks

## 2012-06-10 NOTE — Telephone Encounter (Signed)
Patient aware of abnormal labs. He will get repeated tomorrow. Order placed. He is aware to drink gatorade and he knows [facility] has order to give IV fluids today and Thursday. He is changing his bag up to 4 x a day but this is due to leaking and not increased output. He will call with any problems.

## 2012-06-11 ENCOUNTER — Telehealth (INDEPENDENT_AMBULATORY_CARE_PROVIDER_SITE_OTHER): Payer: Self-pay | Admitting: General Surgery

## 2012-06-11 LAB — BASIC METABOLIC PANEL
BUN: 11 mg/dL (ref 6–23)
CO2: 25 mEq/L (ref 19–32)
Chloride: 94 mEq/L — ABNORMAL LOW (ref 96–112)
Creat: 0.83 mg/dL (ref 0.50–1.35)

## 2012-06-11 NOTE — Telephone Encounter (Signed)
Message copied by Liliana Cline on Wed Jun 11, 2012  2:30 PM ------      Message from: Ardeth Sportsman      Created: Wed Jun 11, 2012  2:18 PM      Regarding: f/u labs OK       Continue plan of biweekly labs/2L NS boluses                  ----- Message -----         From: [facility]         Sent: 06/11/2012   1:54 PM           To: Ardeth Sportsman, MD

## 2012-06-11 NOTE — Telephone Encounter (Signed)
Patient made aware of lab results and need for continuation of labs/IV fluids until labs stabilize back to normal.

## 2012-06-11 NOTE — Telephone Encounter (Signed)
Order faxed to [facility] for biweekly IVF infusions with 2 liters of normal saline and biweekly BMET to be drawn. They are to call with any questions- order faxed to [facility] at [phone].

## 2012-06-12 ENCOUNTER — Telehealth (INDEPENDENT_AMBULATORY_CARE_PROVIDER_SITE_OTHER): Payer: Self-pay | Admitting: General Surgery

## 2012-06-12 NOTE — Telephone Encounter (Signed)
Pt called to let [facility] know he prefers to go to [place] for his labs to be drawn, than to have the [facility] staff do it.  He wants the results back more quickly.  Arranged for orders to be sent to [facility] for twice weekly blood drawns.

## 2012-06-13 ENCOUNTER — Telehealth (INDEPENDENT_AMBULATORY_CARE_PROVIDER_SITE_OTHER): Payer: Self-pay | Admitting: General Surgery

## 2012-06-13 DIAGNOSIS — E860 Dehydration: Secondary | ICD-10-CM

## 2012-06-13 DIAGNOSIS — Z9049 Acquired absence of other specified parts of digestive tract: Secondary | ICD-10-CM

## 2012-06-13 NOTE — Telephone Encounter (Signed)
Standing order for BMET biweekly sent to [facility] per patient request.

## 2012-06-16 ENCOUNTER — Other Ambulatory Visit: Payer: Self-pay | Admitting: *Deleted

## 2012-06-16 ENCOUNTER — Encounter: Payer: Self-pay | Admitting: Oncology

## 2012-06-16 ENCOUNTER — Other Ambulatory Visit (INDEPENDENT_AMBULATORY_CARE_PROVIDER_SITE_OTHER): Payer: Self-pay | Admitting: Surgery

## 2012-06-16 ENCOUNTER — Telehealth: Payer: Self-pay | Admitting: Oncology

## 2012-06-16 ENCOUNTER — Ambulatory Visit: Payer: BC Managed Care – PPO

## 2012-06-16 ENCOUNTER — Ambulatory Visit (HOSPITAL_BASED_OUTPATIENT_CLINIC_OR_DEPARTMENT_OTHER): Payer: BC Managed Care – PPO | Admitting: Oncology

## 2012-06-16 VITALS — BP 104/64 | HR 77 | Temp 97.3°F | Resp 18 | Ht 70.0 in | Wt 236.3 lb

## 2012-06-16 DIAGNOSIS — D369 Benign neoplasm, unspecified site: Secondary | ICD-10-CM

## 2012-06-16 DIAGNOSIS — C180 Malignant neoplasm of cecum: Secondary | ICD-10-CM

## 2012-06-16 DIAGNOSIS — E119 Type 2 diabetes mellitus without complications: Secondary | ICD-10-CM

## 2012-06-16 DIAGNOSIS — C73 Malignant neoplasm of thyroid gland: Secondary | ICD-10-CM

## 2012-06-16 LAB — BASIC METABOLIC PANEL
BUN: 18 mg/dL (ref 6–23)
Chloride: 96 mEq/L (ref 96–112)
Creat: 1.07 mg/dL (ref 0.50–1.35)

## 2012-06-16 NOTE — Progress Notes (Signed)
Checks CBG daily-ranges 70's to 110 Mild irritation around stoma site-improving with assistance of [facility] nurse  Reports some skin breakdown buttocks from hospitalization-healing with topical cream

## 2012-06-16 NOTE — Progress Notes (Signed)
Met with patient and explained role of nurse navigator.  Referral made to genetics for h/o familial adenomatous polyposis syndrome.  Resource information and contact phone numbers given to patient.  Will continue to follow as needed.

## 2012-06-16 NOTE — Progress Notes (Signed)
[facility] New Patient Consult   Referring MD: Birdie Grothaus 56 y.o.  [birth year]    Reason for Referral: Colon cancer, familial polyposis     HPI: He was discovered to have multiple colon polyps in 2002 and was diagnosed with familial adenomatous polyposis. He was referred to a surgeon to discuss a total proctocolectomy, but he did not followup. He did not undergo a followup colonoscopies as recommended.  He was found to have a Hemoccult positive stool in July of this year and was referred to Dr. Christella Hartigan. He underwent an upper endoscopy on 04/22/2012. The mucosa was slightly irregular in the duodenal bulb. Biopsies were obtained. A colonoscopy on 03/25/2012 revealed at least 200 polyps throughout the colon from the cecum to the most distal aspect of the rectum. The polyps were predominantly sessile a malignant mass was noted at the ileocecal valve measuring 4 cm that was firm and ulcerated. Biopsies were taken from the mass. The biopsies from the duodenum on 04/22/2012 revealed inflamed small bowel mucosa. The biopsy from the cecum revealed superficial fragments of a tubular villous adenoma with high-grade dysplasia and areas suspicious for adenocarcinoma.   He was referred for staging CTs on 03/28/2012. The lungs appear clear with no suspicious pulmonary nodules or pleural effusion. A 2 cm left thyroid nodule was noted. The liver appeared normal. Low-density bilateral adrenal nodules were felt to adrenal acentric wall thickening was noted involving the a sending colon suspicious for a primary colonic adenocarcinoma. Associated small ileocolic lymph nodes measured up to 8 mm. Additional small retroperitoneal/pelvic lymph nodes did not meet pathologic CT size criteria. Mild prostatomegaly.  A thyroid ultrasound confirmed a solid left thyroid nodule. A prominent lymph node was noted in the right neck deep to the sternocleidomastoid muscle with normal fatty hilum. A  biopsy the thyroid nodule revealed a follicular lesion of undetermined significance. A carpal cell neoplasm could not be excluded.  He was referred to Dr. Michaell Cowing and was taken to the operating room on 05/20/2012 and underwent a laparoscopic assisted total proctocolectomy, ileal pouch-anal anastomosis, diverting loop ileostomy, excision of anal canal polyp, and a left thyroid lobectomy. A mass was noted at the cecum measuring 5 cm. Other colon polyps were noted. An 8 mm pedunculated polyp was noted in the left anterior anal canal.  The pathology ([id number]) revealed a colonic adenocarcinoma at the cecum extending into the peri-cecal connective tissue. The margins were negative. There were 49 benign lymph nodes. Multiple adenomatous polyps were noted in the colon. The anal biopsy revealed a benign anorectal polyp. No adenomatous change, dysplasia, or malignancy. 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  He reports an uneventful operative recovery. No complaint today. Past Medical History  Diagnosis Date  . Diabetes mellitus   .  familial polyposis   2002       . Hypertension   . Arthritis   . Nasal congestion     Hrthle cell neoplasm-left thyroid   2013   . Hyperlipidemia   . Heart murmur     Past Surgical History  Procedure Date  . Knee arthroscopy 1998    Right knee  . Upper gastrointestinal endoscopy  04/22/2012   . Wisdom tooth extraction   . Colon resection 05/20/2012    Procedure: COLON RESECTION LAPAROSCOPIC;  Surgeon: Ardeth Sportsman, MD;  Location: [facility] ORS;  Service: General;  Laterality: N/A;  Laparoscopic Proctocolectomy, Ileal Pouch Anal Anastomoisis, Loop Ileostomy  .  Bowel resection 05/20/2012    Procedure: LOW ANTERIOR BOWEL RESECTION;  Surgeon: Ardeth Sportsman, MD;  Location: [facility] ORS;  Service: General;  Laterality: Left;  . Thyroid lobectomy 05/20/2012    Procedure: THYROID LOBECTOMY;  Surgeon: Ardeth Sportsman, MD;  Location: [facility]  ORS;  Service: General;  Laterality: Left;  LEFT THYROID LOBECTOMY  . Ileostomy 05/20/12  .      Family History  Problem Relation Age of Onset  . Uterine cancer Mother   . Hypertension Mother   . Stroke Mother  70   . Colon cancer Mother   . Colon polyps Mother   .         Marland Kitchen Diabetes Father   . Obesity Father   . Pneumonia Father   . Cancer Father     skin - squamous , non-Hodgkin's lymphoma   . Diabetes Paternal Grandmother   . Prostate cancer Neg Hx   One sister, no children, no other family history of cancer   Current outpatient prescriptions:ACCU-CHEK AVIVA PLUS test strip, , Disp: , Rfl: ;  cholecalciferol (VITAMIN D) 1000 UNITS tablet, Take 1,000 Units by mouth 2 (two) times daily at 10 AM and 5 PM. , Disp: , Rfl: ;  CRESTOR 10 MG tablet, take 1 tablet by mouth at bedtime, Disp: 30 tablet, Rfl: 11;  fluticasone (FLONASE) 50 MCG/ACT nasal spray, Place 2 sprays into the nose daily., Disp: 16 g, Rfl: 12 glimepiride (AMARYL) 2 MG tablet, Take 1 tablet (2 mg total) by mouth 2 (two) times daily., Disp: 60 tablet, Rfl: 12;  lisinopril (PRINIVIL,ZESTRIL) 5 MG tablet, Take 5 mg by mouth at bedtime. , Disp: , Rfl: ;  loperamide (IMODIUM A-D) 2 MG tablet, Take 1-2 tablets (2-4 mg total) by mouth 4 (four) times daily as needed for diarrhea or loose stools (use if emptying ileostomy more than 6x/day)., Disp: 40 tablet, Rfl: 2 loratadine (CLARITIN) 10 MG tablet, Take 10 mg by mouth daily as needed., Disp: , Rfl: ;  metFORMIN (GLUCOPHAGE) 1000 MG tablet, Take 1 tablet (1,000 mg total) by mouth 2 (two) times daily with a meal., Disp: 60 tablet, Rfl: 12;  saxagliptin HCl (ONGLYZA) 5 MG TABS tablet, Take 5 mg by mouth daily with supper. , Disp: , Rfl: ;  vitamin B-12 (CYANOCOBALAMIN) 1000 MCG tablet, Take 500 mcg by mouth daily. , Disp: , Rfl:  acetaminophen (TYLENOL) 500 MG tablet, Take 1,000 mg by mouth every 6 (six) hours as needed. For pain, Disp: , Rfl: ;  oxyCODONE (OXY IR/ROXICODONE) 5 MG  immediate release tablet, Take 1-2 tablets (5-10 mg total) by mouth every 4 (four) hours as needed for pain., Disp: 50 tablet, Rfl: 0  Allergies:  Allergies  Allergen Reactions  . Ibuprofen     Joint swelling    Social History: He lives in [place]. He works as a Surveyor, mining.no tobacco or alcohol use. No risk factor for HIV or hepatitis. No transfusion history.     History  Smoking status  . Former Smoker  . Quit date: 08/07/1991  Smokeless tobacco  . Never Used    Comment: quit in 1993     ROS:   Positives include: intentional 40 pound weight loss over the past year with dieting and exercise   A complete ROS was otherwise negative.  Physical Exam:  Blood pressure 104/64, pulse 77, temperature 97.3 F (36.3 C), temperature source Oral, resp. rate 18, height 5\' 10"  (1.778 m), weight 236 lb 4.8 oz (107.185 kg).  HEENT:  oropharynx without visible mass, neck without mass Lungs:  clear bilaterally  Cardiac:  regular rate and rhythm  Abdomen:  No hepatosplenomegaly, nontender, no mass, healed incisions GU:  testes without mass, left testicle is slightly larger than the right side   Vascular:  no leg edema, bilateral low leg varicosities  Lymph nodes:  no cervical, supraclavicular, axillary, or inguinal nodes  Neurologic:  alert and oriented, the motor exam appears intact in the upper and lower Jevity's Skin:  no rash    LAB:  CBC  Lab Results  Component Value Date   WBC 11.2* 06/04/2012   HGB 12.8* 06/04/2012   HCT 39.2 06/04/2012   MCV 85.4 06/04/2012   PLT 598* 06/04/2012     CMP      Component Value Date/Time   NA 126* 06/10/2012 0907   K 5.7* 06/10/2012 0907   CL 94* 06/10/2012 0907   CO2 25 06/10/2012 0907   GLUCOSE 162* 06/10/2012 0907   BUN 11 06/10/2012 0907   CREATININE 0.83 06/10/2012 0907   CREATININE 0.74 05/26/2012 0345   CALCIUM 9.0 06/10/2012 0907   PROT 7.0 06/04/2012 1150   ALBUMIN 3.8 06/04/2012 1150   AST 14 06/04/2012 1150    ALT 12 06/04/2012 1150   ALKPHOS 71 06/04/2012 1150   BILITOT 0.5 06/04/2012 1150   GFRNONAA >90 05/26/2012 0345   GFRAA >90 05/26/2012 0345    [name] on 04/04/2012-5.0   Radiology: as per history of present illness     Assessment/Plan:    1. 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 Familial polyposis with multiple adenomatous polyps noted on the colectomy specimen 05/20/2012  3. 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 Diabetes     Disposition:    Mr. Mengel was diagnosed with a stage II adenocarcinoma of the cecum in addition to multiple adenomatous polyps. He appears to have familial adenomatous polyposis. This was diagnosed in approximately 2002.  We discussed the diagnosis of stage II colon cancer, prognosis, and adjuvant treatment options. We reviewed the details of the surgical pathology report. The tumor does not have "high-risk "features such as a limited number of sampled lymph nodes, lymphovascular invasion, or bowel perforation. He has a good prognosis for a long-term disease-free survival. We discussed the controversy surrounding the use of adjuvant systemic chemotherapy in this setting. We discussed the small potential absolute benefit associated with chemotherapy in his case. I do not recommend chemotherapy and he is comfortable with this decision.  He has familial polyposis and will continue surveillance endoscopies with Dr. Christella Hartigan. I am not aware of in association with thyroid tumors.I am also not aware prognostic data in patients with familial polyposis compared to a sporadic colon cancers.  We will ask his primary physician to obtain a baseline [name]. He will return for an office visit and [name] in 6 months.  Julyssa Kyer 06/16/2012, 4:42 PM

## 2012-06-16 NOTE — Telephone Encounter (Signed)
Gave pt appt for lab and MD on May 2014 , pt opt to not see genetic counselor

## 2012-06-16 NOTE — Progress Notes (Signed)
Checked in new patient. °

## 2012-06-16 NOTE — Progress Notes (Signed)
Patient stated he does not want a referral to Genetics after all.  Dr. Truett Perna is aware.

## 2012-06-17 ENCOUNTER — Telehealth: Payer: Self-pay | Admitting: Gastroenterology

## 2012-06-17 ENCOUNTER — Telehealth (INDEPENDENT_AMBULATORY_CARE_PROVIDER_SITE_OTHER): Payer: Self-pay | Admitting: General Surgery

## 2012-06-17 NOTE — Telephone Encounter (Signed)
Message copied by Liliana Cline on Tue Jun 17, 2012  9:35 AM ------      Message from: Ardeth Sportsman      Created: Mon Jun 16, 2012  8:33 PM       Sodium levels improving.  Potassium still high, but not critical yet.  Continue 2 L twice a week IV fluid resuscitation.

## 2012-06-17 NOTE — Telephone Encounter (Signed)
Pt sister wants to know what test the pt had done to show familial polyposis.  I researched [facility] and the pt had a colon in 2002 that showed over 100 polyps and was then sent for genetic testing.  Pt will let his sister know so that she can tell her GI dr.

## 2012-06-17 NOTE — Telephone Encounter (Signed)
Patient made aware sodium improving and we will keep doing what we are doing with IV fluids and biweekly lab checks.

## 2012-06-17 NOTE — Telephone Encounter (Signed)
Left message on machine to call back  

## 2012-06-18 ENCOUNTER — Telehealth (INDEPENDENT_AMBULATORY_CARE_PROVIDER_SITE_OTHER): Payer: Self-pay | Admitting: General Surgery

## 2012-06-18 ENCOUNTER — Other Ambulatory Visit (INDEPENDENT_AMBULATORY_CARE_PROVIDER_SITE_OTHER): Payer: Self-pay | Admitting: Surgery

## 2012-06-18 ENCOUNTER — Telehealth: Payer: Self-pay

## 2012-06-18 ENCOUNTER — Encounter (INDEPENDENT_AMBULATORY_CARE_PROVIDER_SITE_OTHER): Payer: Self-pay | Admitting: Surgery

## 2012-06-18 ENCOUNTER — Ambulatory Visit (INDEPENDENT_AMBULATORY_CARE_PROVIDER_SITE_OTHER): Payer: BC Managed Care – PPO | Admitting: Surgery

## 2012-06-18 VITALS — BP 100/58 | HR 84 | Temp 97.3°F | Resp 16 | Ht 70.0 in | Wt 235.2 lb

## 2012-06-18 DIAGNOSIS — E871 Hypo-osmolality and hyponatremia: Secondary | ICD-10-CM

## 2012-06-18 DIAGNOSIS — D497 Neoplasm of unspecified behavior of endocrine glands and other parts of nervous system: Secondary | ICD-10-CM

## 2012-06-18 DIAGNOSIS — C180 Malignant neoplasm of cecum: Secondary | ICD-10-CM

## 2012-06-18 DIAGNOSIS — D126 Benign neoplasm of colon, unspecified: Secondary | ICD-10-CM

## 2012-06-18 DIAGNOSIS — D34 Benign neoplasm of thyroid gland: Secondary | ICD-10-CM

## 2012-06-18 DIAGNOSIS — K9413 Enterostomy malfunction: Secondary | ICD-10-CM

## 2012-06-18 DIAGNOSIS — K620 Anal polyp: Secondary | ICD-10-CM

## 2012-06-18 LAB — BASIC METABOLIC PANEL
Calcium: 9.6 mg/dL (ref 8.4–10.5)
Glucose, Bld: 95 mg/dL (ref 70–99)
Sodium: 137 mEq/L (ref 135–145)

## 2012-06-18 NOTE — Patient Instructions (Addendum)
Continue to drink plenty of fluids, especially electrolyte fluids such as Gatorade.  Avoid dehydration.  Continue lab checks and IV fluid boluses twice a week.  Plan scope sigmoidoscopy & enema studies to make sure her new ileal pouch is healing well just prior to surgery.  Plan takedown of loop ileostomy in late January if you continue to improve.  Loop Ileostomy Reversal A loop ileostomy reversal is surgery that reverses a temporary loop ileostomy. The small intestine is disconnected from the opening in the abdomen (stoma). It is then reconnected inside the body. A stoma and bag are no longer needed. Bowel movements can resume through the rectum. A reversal may be done after another part of your bowel has had time to heal and your caregiver feels that you are healthy enough to have surgery. LET YOUR CAREGIVER KNOW ABOUT:  Allergies to food or medicine.  Medicines taken, including vitamins, health supplements, herbs, eyedrops, over-the-counter medicines, and creams.  Use of steroids (by mouth or creams).  Previous problems with anesthetics or numbing medicines.  History of bleeding problems or blood clots.  Previous surgery.  Other health problems, including diabetes and kidney problems.  Possibility of pregnancy, if this applies. RISKS AND COMPLICATIONS  Reaction to anesthesia.  Infection inside the abdominal cavity (abscess).  Wound infection where the stoma was.  Blood clot.  Bleeding.  Scarring.  Slow recovery of intestinal function (ileus).  Intestinal blockage.  Intestinal leakage.  Narrowing of the joined area. BEFORE THE PROCEDURE It is important to follow your caregiver's instructions prior to your procedure. This helps to avoid complications. Steps before your procedure may include:  A physical exam, rectal exam, X-rays, and other procedures.  Chemotherapy or radiation therapy if your ileostomy was done as part of a cancer surgery.  A review of the  procedure, the anesthesia being used, and what to expect after the procedure. You may be asked to:  Stop taking certain medicines for several days prior to your procedure. This may include blood thinners (such as aspirin).  Take certain medicines, such as stool softeners.  Follow a special diet for several days prior to the procedure.  Avoid eating and drinking after midnight the night before the procedure. This will help you to avoid complications from the anesthesia.  Quit smoking. Smoking increases the chances of a healing problem after your procedure. PROCEDURE You will be given medicine that makes you sleep (general anesthetic).The surgeon will make a small cut (incision) around the stoma. The surgeon will then stitch or staple the 2 ends of the intestine back together, leaving the joined intestine inside the abdominal cavity. AFTER THE PROCEDURE  You will be given pain medicine.  Your caregivers will slowly increase your diet and movement.  You can expect to be in the hospital for about 3 to 5 days.  You should arrange for someone to help you with activities at home while you recover. Document Released: 07/12/2011 Document Revised: 10/15/2011 Document Reviewed: 07/12/2011 [facility] Patient Information 2013 [place], [place].   Sodium (Na) This is a test used to check whether your sodium concentration is within normal limits. It helps check electrolyte balance and kidney function. It also helps to monitor long-standing or sudden onset of high sodium levels (hypernatremia) or low sodium levels (hyponatremia). The test may be done if you have dehydration, edema, problems with blood pressure, or nonspecific symptoms. It may be done as part of a routine lab evaluation. It may also be done to monitor certain chronic conditions, such  as high or low blood pressure. This test measures the level of sodium in the blood. Sodium is a mineral that is vital to normal body function. It is an  electrolyte. This is a positively charged molecule. It works with other electrolytes, such as potassium, chloride, and total carbon dioxide (CO2), to help regulate the amount of fluid in the body. Sodium is present in all body fluids. However, it is found in the highest concentration in the blood and in the fluid outside the body's cells. We get sodium in our diet from table salt (sodium chloride or NaCl) and from most of the foods we eat. Most people have an adequate intake of sodium. The body uses what it needs and the kidneys excrete the rest in the urine. The body maintains sodium concentration within a very narrow range. It does this by producing hormones that can increase (natriuretic peptides) or decrease (aldosterone) sodium losses in urine. The body produces a hormone that prevents water losses (antidiuretic hormone [ADH]) and controls thirst. Even a 1% increase in blood sodium will make you thirsty. This causes you to drink water. This returns your sodium level to normal. Abnormal blood sodium is usually due to a problem with one of these systems. When the level of sodium in the blood changes, the water content in your body also changes. These changes can be associated with dehydration or excess fluid (edema), especially in the legs. PREPARATION FOR TEST A blood sample is obtained by inserting a needle into a vein in the arm. NORMAL FINDINGS  Adults: 136 to 145 mEq/L (136 to 145 mmol/L)  Adults older than 90 years: 132 to 146 mEq/L (132 to 146 mmol/L)  Premature infants, cord blood: 116 to 140 mEq/L (116 to 140 mmol/L)  Premature infants at 48 hours: 128 to 148 mEq/L (128 to 148 mmol/L)  Full term neonates, cord blood: 126 to 166 mEq/L (126 to 166 mmol/L)  Full term neonates: 133 to 146 mEq/L (133 to 146 mmol/L)  Children: 138 to 146 mEq/L (138 to 146 mmol/L) Ranges for normal findings may vary among different laboratories and hospitals. You should always check with your caregiver after  having lab work or other tests done to discuss the meaning of your test results and whether your values are considered within normal limits. MEANING OF TEST  Your caregiver will go over the test results with you. Your caregiver will discuss the importance and meaning of your results. He or she will also discuss treatment options and additional tests, if needed. OBTAINING THE TEST RESULTS It is your responsibility to obtain your test results. Ask the lab or department performing the test when and how you will get your results. Document Released: 08/25/2004 Document Revised: 10/15/2011 Document Reviewed: 07/04/2008 [facility] Patient Information 2013 [place], [place].

## 2012-06-18 NOTE — Progress Notes (Signed)
Subjective:     Patient ID: KAIN MILOSEVIC, male   DOB: 10/23/1955, 56 y.o.   MRN: [id number]  HPI   DAMARIEN NYMAN  1955/10/06 [id number]  Patient Care Team: [name], MD as PCP - General (Family Medicine) Rachael Fee, MD as Consulting Physician (Gastroenterology) [name], MD as Consulting Physician (General Surgery)  This patient is a 56 y.o.male who presents today for surgical evaluation.   PROCEDURE PERFORMED 05/20/2012 1. Laparoscopically-assisted total proctocolectomy.  2. Ileal J pouch - anal anastomosis.  3. Diverting loop ileostomy.  4. Excision of anal canal polyp.  5. Left thyroid lobectomy.    The patient comes in today with his wife feeling much better.   No fevers or chills.  Urinating fine.  No lightheadedness or dizziness.  Walking a couple times a day.    Minimal perianal irritation.  Using a cream but still has a mild rash.  No drainage.  No blood.  Emptying the ileostomy bag three times a day.  Down to using Imodium only twice a day.  Had a period of low sodium level.  Getting twice a week saline infusions, 2 L at a time.  No shortness of breath.  No difficulty with swallowing.  Back to singing in church.  Talking well.  Feels better overall.  They saw medical oncology.  Dr. Truett Perna did not feel the patient required any post adjuvant therapy since he was T3 N0  Patient Active Problem List  Diagnosis  . DIABETES MELLITUS, TYPE II  . Obesity (BMI 30-39.9)  . HYPERTENSION, BENIGN ESSENTIAL  . ECZEMA  . Familial adenomatous polyposis coli  . VITAMIN B12 DEFICIENCY  . UNSPECIFIED VITAMIN D DEFICIENCY  . Pure hypercholesterolemia  . 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 Cecal cancer  . Anal polyp s/p polypectomy  . Ileostomy dysfunction - leaking  . Hyponatremia with ileostomy    Past Medical History  Diagnosis Date  . Diabetes mellitus   . Colon polyps     he had declined f/u colonoscopy after inital colonoscopy  .  Hypertension   . Arthritis   . Nasal congestion   . Colonic mass   . Hyperlipidemia   . Heart murmur     Past Surgical History  Procedure Date  . Knee arthroscopy 1998    Right knee  . Upper gastrointestinal endoscopy   . Wisdom tooth extraction   . Colon resection 05/20/2012    Procedure: COLON RESECTION LAPAROSCOPIC;  Surgeon: Ardeth Sportsman, MD;  Location: [facility];  Service: General;  Laterality: N/A;  Laparoscopic Proctocolectomy, Ileal Pouch Anal Anastomoisis, Loop Ileostomy  . Bowel resection 05/20/2012    Procedure: LOW ANTERIOR BOWEL RESECTION;  Surgeon: Ardeth Sportsman, MD;  Location: [facility];  Service: General;  Laterality: Left;  . Thyroid lobectomy 05/20/2012    Procedure: THYROID LOBECTOMY;  Surgeon: Ardeth Sportsman, MD;  Location: [facility];  Service: General;  Laterality: Left;  LEFT THYROID LOBECTOMY  . Ileostomy 05/20/12  . Colon surgery     History   Social History  . Marital Status: Married    Spouse Name: June    Number of Children: 0  . Years of Education: N/A   Occupational History  . 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 Smoking status: Former Engineer, civil (consulting)  date: 08/07/1991  . Smokeless tobacco: Never Used     Comment: quit in 1993  . Alcohol Use: No  . Drug Use: No  . Sexually Active: Not on file   Other Topics Concern  . 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 Uterine cancer Mother   . Hypertension Mother   . Stroke Mother   . Colon cancer Mother   . Colon polyps Mother   . Cancer Mother     colon, endometrial  . Diabetes Father   . Obesity Father   . Pneumonia Father   . Cancer Father     skin - squamous   . Diabetes Paternal Grandmother   . Prostate cancer Neg Hx     Current Outpatient  Prescriptions  Medication Sig Dispense Refill  . ACCU-CHEK AVIVA PLUS test strip       . cholecalciferol (VITAMIN D) 1000 UNITS tablet Take 1,000 Units by mouth 2 (two) times daily at 10 AM and 5 PM.       . CRESTOR 10 MG tablet take 1 tablet by mouth at bedtime  30 tablet  11  . fluticasone (FLONASE) 50 MCG/ACT nasal spray Place 2 sprays into the nose daily.  16 g  12  . glimepiride (AMARYL) 2 MG tablet Take 1 tablet (2 mg total) by mouth 2 (two) times daily.  60 tablet  12  . lisinopril (PRINIVIL,ZESTRIL) 5 MG tablet Take 5 mg by mouth at bedtime.       Marland Kitchen loperamide (IMODIUM A-D) 2 MG tablet Take 1-2 tablets (2-4 mg total) by mouth 4 (four) times daily as needed for diarrhea or loose stools (use if emptying ileostomy more than 6x/day).  40 tablet  2  . loratadine (CLARITIN) 10 MG tablet Take 10 mg by mouth daily as needed.      . metFORMIN (GLUCOPHAGE) 1000 MG tablet Take 1 tablet (1,000 mg total) by mouth 2 (two) times daily with a meal.  60 tablet  12  . saxagliptin HCl (ONGLYZA) 5 MG TABS tablet Take 5 mg by mouth daily with supper.       . vitamin B-12 (CYANOCOBALAMIN) 1000 MCG tablet Take 500 mcg by mouth daily.       Marland Kitchen acetaminophen (TYLENOL) 500 MG tablet Take 1,000 mg by mouth every 6 (six) hours as needed. For pain         Allergies  Allergen Reactions  . Ibuprofen     Joint swelling    BP 100/58  Pulse 84  Temp 97.3 F (36.3 C) (Temporal)  Resp 16  Ht 5\' 10"  (1.778 m)  Wt 235 lb 3.2 oz (106.686 kg)  BMI 33.75 kg/m2  REPORT OF SURGICAL PATHOLOGY FINAL DIAGNOSIS Diagnosis 1. Colon, segmental resection for tumor - COLONIC ADENOCARCINOMA EXTENDING INTO THE PERICECAL CONNECTIVE TISSUE. - MARGINS NOT INVOLVED. - FORTY-NINE BENIGN LYMPH NODES (0/49). - MULTIPLE ADENOMATOUS POLYPS. - BENIGN APPENDIX WITH FIBROUS OBLITERATION OF THE LUMEN. 2. Rectum, resection, stitch marks proximal end - ADENOMATOUS POLYPS. - MARGINS NOT INVOLVED. - FIVE BENIGN LYMPH NODES (0/5). 3.  Polyp, anal - BENIGN ANORECTAL POLYP. - NO ADENOMATOUS CHANGE, DYSPLASIA OR MALIGNANCY. 4. Colon, resection margin (donut), blue stitch ileol pouch: non-stitch distal rectum - BENIGN ILEUM. - BENIGN ANORECTAL TISSUE. - NO ADENOMATOUS CHANGE OR MALIGNANCY. 5. Thyroid, lobectomy, left - HURTHLE CELL NEOPLASM. -  MARGINS NOT INVOLVED. Microscopic Comment 1. COLON AND RECTUM Specimen: Abdominal colon Procedure: Resection Tumor site: Cecum at level of ileocecal valve Specimen integrity: Intact Macroscopic tumor perforation: No Invasive tumor: Maximum size: 5 cm Histologic type(s): Colorectal adenocarcinoma with partial mucinous features 1 of 3 FINAL for THEOPLIS, GARCIAGARCIA ([id number]) Microscopic Comment(continued) Histologic grade and differentiation: G2: moderately differentiated/low grade Microscopic extension of invasive tumor: Into pericecal connective tissue Lymph-Vascular invasion: Not identified Peri-neural invasion: Not identified Tumor deposit(s) (discontinuous extramural extension): No Resection margins: Proximal margin: Free of tumor Distal margin: Free of tumor Circumferential (radial) (posterior ascending, posterior descending; lateral and posterior mid-rectum; and entire lower 1/3 rectum): Free of tumor Treatment effect (neoadjuvant therapy): No Number of lymph nodes examined 49 (plus 5 benign nodes form part 2, rectum) ; Number positive 0 Additional polyp(s): Numerous adenomatous polyps Non-neoplastic findings: Fibrous obliteration of appendiceal lumen Pathologic Staging: pT3, pN0, pMX Ancillary studies: Can be performed if requested. Case discussed with Dr. Michaell Cowing on 05/23/12. (JDP:kh 05-22-12) 5. The nodule is composed of follicules which are lined by Hurthle cells. The peripheral capsule is variable and focally inapparent. No areas of capsular or vascular invasion are identified and the findings are consistent with low grade Hurthle cell neoplasm. Dr. Colonel Bald has  reviewed part 5 of this case and agrees. Jimmy Picket MD Pathologist, Electronic Signature (Case signed 05/26/2012) Specimen Lennell Shanks and Clinical Information Specimen(s) Obtained: 1. Colon, segmental resection for tumor 2. Rectum, resection, stitch marks proximal end 3. Polyp, anal 4. Colon, resection margin (donut), blue stitch ileol pouch: non-stitch distal rectum 5. Thyroid, lobectomy, left Specimen Clinical Information 1. cecal mass, FAP, thyroid nodule, (je) 5. hx of thyroid mass (je) Lexxie Winberg 1. Specimen: Abdominal colon, received in formalin Specimen integrity: Intact Specimen length: The specimen includes 4 cm of terminal ileum and 118 cm of colon Tumor location: The primary lesion is located in the cecum at the level of the ileocecal valve Tumor size: The primary lesion consists of a sessile, ulcerated red brown mass measuring 5 x 4.2 x 1.2 cm Percent of bowel circumference involved: 70% Tumor distance to margins: Proximal: 4 cm Distal: 109 cm Macroscopic extent of tumor invasion: Tumor invades through the muscularis propria into the subserosal adipose tissue or the non-peritonealized pericolic or perirectal soft tissues but does not involve the visceral (serosa): 2 of 3 FINAL for OKIE, BOGACZ ([id number]) Skyann Ganim(continued) Total presumed lymph nodes: There are 52 rubbery tan red ovoid nodules tentatively identified as lymph nodes varying in size from 0.3 to 1.4 cm in greatest dimension Extramural satellite tumor nodules: Not grossly identified Mucosal polyp(s): There are innumerable polyps present throughout the entire segment varying in size from 0.2 to 1.2 cm. The cecum and proximal ascending colon is virtually carpeted with polyps. The polyps grossly appear to be superficial without invasion grossly identified. Additional findings: The remainder of the uninvolved mucosa is glistening and tan. The appendix is present and measures 6.8 cm in length x 0.7 cm in  diameter. Block summary: 29 blocks submitted A = proximal margin B = distal margin C = proximal cecal tumor to uninvolved mucosa transition D = distal cecal tumor to uninvolved mucosa transition E, F = deep cecal extension G = tissue for molecular testing H - R = sections of polyps sequentially labeled from proximal to distal S = appendix T - Z = 38 lymph nodes from proximal half of segment ZA, ZB, ZC = 14 lymph nodes from distal half of segment (GRP:caf 05/21/12) 2. Received in  formalin is a previously opened segment of colon, clinically rectum, which measures 10 cm in length. The attached mesorectum is intact. There is a suture attached marking the distal margin. The mucosa is glistening and tan. There are six sessile soft tan mucosal polyps present measuring 0.4 to 0.6 cm in greatest dimension. The polyps extend to within 1 cm of the proximal margin and 1 cm of the distal margin. Within the attached fat there are eight rubbery tan red ovoid nodules tentatively identified as lymph nodes measuring 0.3 to 0.7 cm in greatest dimension. Sections are submitted in seven cassettes. A = proximal margin B = distal margin C, D, E = polyps F, G = lymph nodes 3. Received in formalin is a 1 x 0.5 x 0.5 cm rubbery tan polypoid portion of mucosa. The base is inked and the specimen is sectioned and entirely submitted in one cassette. 4. Received in formalin are two intestinal anastomotic rings, one of which is identified as the ileal pouch with a blue suture. The ring without a suture is identified as distal rectum. Each piece measures 2 cm in diameter and each is 1.2 cm in length. The mucosa is glistening and tan. Sections are submitted in two cassettes. A = sections from ileal ring B = sections from rectal ring 5. Received in formalin is a 4.6 x 2.7 x 2.1 cm, 8.5 gram portion of thyroid, clinically left lobe. There is a suture attached marking the superior aspect. The margins are inked prior to  sectioning. At the inferior pole there is a 2.5 x 1.7 x 1.5 cm well circumscribed rubbery ovoid nodule. The cut surface of the nodule is mottled tan red to hemorrhagic. The surrounding parenchyma is homogeneous red brown. Sections are submitted in seven cassettes. A = isthmus margin B - G = entire nodule (GRP:caf 05/21/12) Report signed out from the following location(s) [facility] [address], [place], [place] [address]. CLIA #: [id number],   Review of Systems  Constitutional: Negative for fever, chills, diaphoresis, appetite change and fatigue.  HENT: Negative for sore throat, mouth sores, trouble swallowing, neck pain, neck stiffness, voice change and sinus pressure.   Eyes: Negative for photophobia and visual disturbance.  Respiratory: Negative for choking and shortness of breath.   Cardiovascular: Negative for chest pain and palpitations.  Gastrointestinal: Negative for nausea, vomiting, abdominal pain, diarrhea, constipation, blood in stool, abdominal distention, anal bleeding and rectal pain.  Genitourinary: Negative for dysuria, urgency, difficulty urinating and testicular pain.  Musculoskeletal: Negative for myalgias, arthralgias and gait problem.  Skin: Positive for rash. Negative for color change and wound.  Neurological: Negative for dizziness, syncope, speech difficulty, weakness, light-headedness, numbness and headaches.  Hematological: Negative for adenopathy.  Psychiatric/Behavioral: Negative for hallucinations, confusion and agitation.        Objective:   Physical Exam  Constitutional: He is oriented to person, place, and time. He appears well-developed and well-nourished. No distress.  HENT:  Head: Normocephalic.  Mouth/Throat: Uvula is midline, oropharynx is clear and moist and mucous membranes are normal. No oropharyngeal exudate, posterior oropharyngeal edema or posterior oropharyngeal erythema.       Mild hoarseness  Eyes: Conjunctivae  normal and EOM are normal. Pupils are equal, round, and reactive to light. No scleral icterus.  Neck: Normal range of motion. No tracheal deviation present.    Cardiovascular: Normal rate, normal heart sounds and intact distal pulses.   Pulmonary/Chest: Effort normal. No respiratory distress.  Abdominal: Soft. He exhibits no distension and  no mass. There is no tenderness. There is no rebound, no guarding and no CVA tenderness. No hernia. Hernia confirmed negative in the ventral area, confirmed negative in the right inguinal area and confirmed negative in the left inguinal area.         Incisions clean with normal healing ridges.  No hernias  Genitourinary:       Exam done with assistance of male Medical Assistant in the room.  Perianal skin clean with good hygiene.  Mild rash.  Thick perianal ointment on with mild irritation.    No external skin tags / hemorrhoids of significance.  No pilonidal disease.  No fissure.  No abscess/fistula.  Normal sphincter tone.      Musculoskeletal: Normal range of motion. He exhibits no tenderness.  Neurological: He is alert and oriented to person, place, and time. No cranial nerve deficit. He exhibits normal muscle tone. Coordination normal.  Skin: Skin is warm and dry. No rash noted. He is not diaphoretic.  Psychiatric: He has a normal mood and affect. His behavior is normal.       Assessment:     1 month status post abdominal colectomy ileal pouch and diverting loop ileostomy and left thyroid lobectomy.  Recovering relatively well except for hyponatremia.    Plan:     Activity as tolerated to regular activity.  Do not push through pain.  Diet as tolerated. Bowel regimen to avoid problems.  Continue Imodium 3 times a day as that seems to keep the consistency well.  Continue outpatient ostomy care through [facility].  I gave further handouts/packages on ileostomy care  I stressed proper nutrition.  Stay hydrated with plenty of liquids  to avoid dehydration.  Try to drink electrolyte-based fluids such as Gatorade.  I stressed some supplemental shakes as well.  Continue saline infusions and lab checks twice a week for a few more weeks.  Keep stoma for at least three months.  Probable enema/flexible endoscopy evaluation to make sure pouch is well healed prior to ileostomy takedown.  The anatomy & physiology of the digestive tract was discussed.  The pathophysiology was discussed.  Possibility of remaining with an ostomy permanently was discussed.  I offered ostomy takedown.  Laparoscopic & open techniques were discussed.   Risks such as bleeding, infection, abscess, leak, reoperation, possible re-ostomy, injury to other organs, hernia, heart attack, death, and other risks were discussed.   I noted a good likelihood this will help address the problem.  Goals of post-operative recovery were discussed as well.  We will work to minimize complications.  Questions were answered.  The patient expresses understanding & wishes to proceed with surgery.  Hurthle cell neoplasm - Low malignant potential.  I discussed the case with my colleagues in the group including Dr. Gerrit Friends.  We feel the patient would benefit from having his remaining right thyroid lobe followed with an ultrasound in a year, perhaps annually as well.  Hold off on completion thyroidectomy.  I am glad his voice is back to normal.  Return to clinic 3-4 weeks.   Instructions discussed.  Followup with primary care physician for other health issues as would normally be done.  Questions answered.  The patient & his wife expressed understanding and appreciation

## 2012-06-18 NOTE — Telephone Encounter (Signed)
Amy, nurse with [facility], called to ask about IVF orders for this pt.  Explained that Dr. Michaell Cowing has ordered frequent checks of blood work and when pt has stabilized, he will discontinue the rehydration orders.  Amy will continue the orders for now and is aware of the date of next office visit.

## 2012-06-18 NOTE — Telephone Encounter (Signed)
Pt wanted to verify at lab appt at [facility] on 06/19/12 pt would get [name] done that was ordered by Dr Truett Perna. Tasha said OK and noted on pt's lab visit. Pt notified while on phone.

## 2012-06-19 ENCOUNTER — Other Ambulatory Visit (INDEPENDENT_AMBULATORY_CARE_PROVIDER_SITE_OTHER): Payer: BC Managed Care – PPO

## 2012-06-19 DIAGNOSIS — E119 Type 2 diabetes mellitus without complications: Secondary | ICD-10-CM

## 2012-06-19 DIAGNOSIS — C180 Malignant neoplasm of cecum: Secondary | ICD-10-CM

## 2012-06-19 LAB — HEMOGLOBIN A1C: Hgb A1c MFr Bld: 7.8 % — ABNORMAL HIGH (ref 4.6–6.5)

## 2012-06-19 NOTE — Addendum Note (Signed)
Addended by: Baldomero Lamy on: 06/19/2012 08:42 AM   Modules accepted: Orders

## 2012-06-23 ENCOUNTER — Telehealth (INDEPENDENT_AMBULATORY_CARE_PROVIDER_SITE_OTHER): Payer: Self-pay | Admitting: General Surgery

## 2012-06-23 ENCOUNTER — Other Ambulatory Visit (INDEPENDENT_AMBULATORY_CARE_PROVIDER_SITE_OTHER): Payer: Self-pay | Admitting: Surgery

## 2012-06-23 LAB — BASIC METABOLIC PANEL
BUN: 12 mg/dL (ref 6–23)
Calcium: 9.4 mg/dL (ref 8.4–10.5)
Creat: 1.07 mg/dL (ref 0.50–1.35)
Potassium: 5.4 mEq/L — ABNORMAL HIGH (ref 3.5–5.3)

## 2012-06-23 NOTE — Telephone Encounter (Signed)
Pt called early Monday to Nursing office, with concerns about "discharge" from his rectum after colon surgery.  He denies pain or anything other than worry.  Explained this is very common following ostomy surgeries, verified it is mucous, pink or brownish in color.  He asked for an appt to see Dr. Michaell Cowing about this, so tried to reassure him.  His appt is not until 07/16/12 for follow-up.  Please advise.

## 2012-06-23 NOTE — Telephone Encounter (Signed)
Spoke with patient and reassured him that this sounds normal. He will make [place] aware of any bright red bleeding per rectum or any other worrisome symptoms. Made him aware sodium levels were back to normal on 06/18/12 and potassium was heading in the right direction. He had labs drawn today and I let him know Dr Michaell Cowing will review when we get results. He asked about a note for work. He is scheduled to go back to work on 07/14/2012 and does not see [place] until 07/16/12. He will talk with his work and decide if he would like to go back prior to reversal or if he would like to stay out. He will call and let me know what he wants to do and we will get him a note. He will call with any other concerns.

## 2012-06-24 ENCOUNTER — Ambulatory Visit (INDEPENDENT_AMBULATORY_CARE_PROVIDER_SITE_OTHER): Payer: BC Managed Care – PPO | Admitting: Family Medicine

## 2012-06-24 ENCOUNTER — Encounter: Payer: Self-pay | Admitting: Family Medicine

## 2012-06-24 ENCOUNTER — Telehealth (INDEPENDENT_AMBULATORY_CARE_PROVIDER_SITE_OTHER): Payer: Self-pay | Admitting: General Surgery

## 2012-06-24 VITALS — BP 102/52 | HR 112 | Temp 98.2°F | Wt 237.8 lb

## 2012-06-24 DIAGNOSIS — E119 Type 2 diabetes mellitus without complications: Secondary | ICD-10-CM

## 2012-06-24 DIAGNOSIS — E039 Hypothyroidism, unspecified: Secondary | ICD-10-CM

## 2012-06-24 DIAGNOSIS — I10 Essential (primary) hypertension: Secondary | ICD-10-CM

## 2012-06-24 DIAGNOSIS — Z23 Encounter for immunization: Secondary | ICD-10-CM

## 2012-06-24 MED ORDER — LEVOTHYROXINE SODIUM 100 MCG PO TABS: 100.0000 ug | ORAL_TABLET | Freq: Every day | ORAL | Status: DC

## 2012-06-24 NOTE — Telephone Encounter (Signed)
Left message on machine for patient to call back and ask for me.   

## 2012-06-24 NOTE — Progress Notes (Signed)
Diabetes:  Using medications without difficulties:yes Hypoglycemic episodes: no Hyperglycemic episodes:no Feet problems: no Blood Sugars averaging: was up around the time of surgery, usually ~70-110 in last few weeks A1c 7.8.    SBP was ~100 and he stopped the lisinopril about 1 week ago. No orthostasis.  Getting IVF 2x/week.  Normal UOP.   Stoma healing.  Occ nonbloody rectal discharge.  No pain.    Surgical hypothyroid, d/w pt.  Labs d/w pt.  See plan. No dysphagia. No anterior neck pain.   PMH and SH reviewed  Meds, vitals, and allergies reviewed.   ROS: See HPI.  Otherwise negative.    GEN: nad, alert and oriented HEENT: mucous membranes moist NECK: supple w/o LA, absent hemithyroid  CV: rrr. PULM: ctab, no inc wob ABD: soft, +bs, stoma intact EXT: no edema SKIN: no acute rash

## 2012-06-24 NOTE — Telephone Encounter (Signed)
Message copied by Liliana Cline on Tue Jun 24, 2012  9:27 AM ------      Message from: Ardeth Sportsman      Created: Tue Jun 24, 2012  7:28 AM       Go down to weekly BMET checks      If BMET OK next week, plan wean off IVF next week      TSH high = start Synthroid daily for transient hypothyroidism.  Probably can wean off later 6-12 months as the remaining lobe recovers...  Anticipate f/u w PCP & possibly endocrine later, but we can follow this for now

## 2012-06-24 NOTE — Patient Instructions (Addendum)
If you have sugar <70, then stop the glimepiride totally.  If you have sugars >130 in AM after that then restart in just in the AM (1 pill a day). When you have your followup surgery date, notify the clinic and give [place] a few AM sugar readings.   Take care.  We'll plan on follow up after your next surgery.

## 2012-06-24 NOTE — Telephone Encounter (Signed)
Patient made aware labs getting better. He will have BMET drawn next Monday and continue IVF until he hears from [place]. He is aware to pick up synthroid RX. Patient also made aware of appt with Dr Maisie Fus on 07/16/12. He will call with any problems and we will touch base next week.

## 2012-06-25 ENCOUNTER — Encounter: Payer: Self-pay | Admitting: Family Medicine

## 2012-06-25 ENCOUNTER — Telehealth: Payer: Self-pay | Admitting: Family Medicine

## 2012-06-25 DIAGNOSIS — E039 Hypothyroidism, unspecified: Secondary | ICD-10-CM | POA: Insufficient documentation

## 2012-06-25 NOTE — Telephone Encounter (Signed)
Left message asking patient to call back

## 2012-06-25 NOTE — Telephone Encounter (Signed)
Call pt.  I go the note from surgery.  I'll follow TSH.  Recheck in 8 weeks at a lab visit.  Thanks.

## 2012-06-25 NOTE — Telephone Encounter (Signed)
Will also need f/u thyroid u/s in 1 year.  This was prev ordered by surgery.

## 2012-06-25 NOTE — Telephone Encounter (Signed)
Advised patient, lab appt scheduled for 08/19/12.

## 2012-06-26 ENCOUNTER — Encounter: Payer: Self-pay | Admitting: Family Medicine

## 2012-06-26 NOTE — Assessment & Plan Note (Signed)
Will recheck TSH in ~2 months.  Labs d/w pt.  He understood.

## 2012-06-26 NOTE — Assessment & Plan Note (Signed)
Lisinopril stopped for now. Will follow BP.

## 2012-06-26 NOTE — Assessment & Plan Note (Signed)
Continue as is, stop glimepiride if any lows, can restart with just a dose in AM if elevated sugars then become a problem. Recheck in about 3 months, depending on the timing of his next surgery.  He'll notify us with AM sugars before next surgery is set.  He agrees.  Labs discussed.

## 2012-06-30 ENCOUNTER — Telehealth (INDEPENDENT_AMBULATORY_CARE_PROVIDER_SITE_OTHER): Payer: Self-pay | Admitting: General Surgery

## 2012-06-30 ENCOUNTER — Other Ambulatory Visit (INDEPENDENT_AMBULATORY_CARE_PROVIDER_SITE_OTHER): Payer: Self-pay | Admitting: Surgery

## 2012-06-30 LAB — BASIC METABOLIC PANEL
Potassium: 4.5 mEq/L (ref 3.5–5.3)
Sodium: 138 mEq/L (ref 135–145)

## 2012-06-30 NOTE — Telephone Encounter (Signed)
Message copied by Liliana Cline on Mon Jun 30, 2012  2:50 PM ------      Message from: Ardeth Sportsman      Created: Mon Jun 30, 2012  1:38 PM       OK to stop home IV fluid infusions.            Check labs next Monday            ----- Message -----         From: Lab In [facility] Interface         Sent: 06/30/2012   1:28 PM           To: Ardeth Sportsman, MD

## 2012-06-30 NOTE — Telephone Encounter (Signed)
Order faxed to [facility] [phone] to stop IV fluids. Patient made aware and will have labs drawn next Monday.

## 2012-07-01 ENCOUNTER — Telehealth (INDEPENDENT_AMBULATORY_CARE_PROVIDER_SITE_OTHER): Payer: Self-pay | Admitting: General Surgery

## 2012-07-01 NOTE — Telephone Encounter (Signed)
Message copied by Liliana Cline on Tue Jul 01, 2012  4:35 PM ------      Message from: Jaishawn Sierras      Created: Tue Jul 01, 2012  3:51 PM      Regarding: ORDER      Contact: [phone]       AMBER HARRIS WITH [facility]/NEEDS CLARIFICATION OF LABS FOR NEXT WEEK/GM

## 2012-07-01 NOTE — Telephone Encounter (Signed)
Called and spoke with [facility]. Made her aware labs are being drawn at [facility].

## 2012-07-07 ENCOUNTER — Other Ambulatory Visit (INDEPENDENT_AMBULATORY_CARE_PROVIDER_SITE_OTHER): Payer: Self-pay | Admitting: Surgery

## 2012-07-07 LAB — BASIC METABOLIC PANEL
CO2: 28 mEq/L (ref 19–32)
Chloride: 103 mEq/L (ref 96–112)
Glucose, Bld: 95 mg/dL (ref 70–99)
Potassium: 4.9 mEq/L (ref 3.5–5.3)
Sodium: 139 mEq/L (ref 135–145)

## 2012-07-08 ENCOUNTER — Telehealth (INDEPENDENT_AMBULATORY_CARE_PROVIDER_SITE_OTHER): Payer: Self-pay | Admitting: General Surgery

## 2012-07-08 NOTE — Telephone Encounter (Signed)
Patient called in and I made him aware to stop having labs drawn. Everything looks good. He did not have a PICC line. He has decided to stay out of work until 08/12/11. Note written and at the front for patient pick up. Patient aware.

## 2012-07-08 NOTE — Telephone Encounter (Signed)
Message copied by Liliana Cline on Tue Jul 08, 2012  3:50 PM ------      Message from: Cathi Roan      Created: Tue Jul 08, 2012  3:33 PM      Regarding: Letter      Contact: [phone]       Spoke with you this morning needs you to call him back

## 2012-07-08 NOTE — Telephone Encounter (Signed)
Message copied by Liliana Cline on Tue Jul 08, 2012  9:12 AM ------      Message from: Ardeth Sportsman      Created: Mon Jul 07, 2012  4:34 PM       OK to stop lab checks.  No more IVF boluses.  OK to d/c PICC line

## 2012-07-08 NOTE — Telephone Encounter (Signed)
Spoke with patient. Aware note at front for pick up.

## 2012-07-16 ENCOUNTER — Ambulatory Visit (INDEPENDENT_AMBULATORY_CARE_PROVIDER_SITE_OTHER): Payer: BC Managed Care – PPO | Admitting: Surgery

## 2012-07-16 ENCOUNTER — Ambulatory Visit (INDEPENDENT_AMBULATORY_CARE_PROVIDER_SITE_OTHER): Payer: BC Managed Care – PPO | Admitting: General Surgery

## 2012-07-16 ENCOUNTER — Encounter (INDEPENDENT_AMBULATORY_CARE_PROVIDER_SITE_OTHER): Payer: Self-pay | Admitting: Surgery

## 2012-07-16 VITALS — BP 122/62 | HR 74 | Temp 97.8°F | Resp 16 | Ht 70.0 in | Wt 232.0 lb

## 2012-07-16 DIAGNOSIS — D497 Neoplasm of unspecified behavior of endocrine glands and other parts of nervous system: Secondary | ICD-10-CM

## 2012-07-16 DIAGNOSIS — D34 Benign neoplasm of thyroid gland: Secondary | ICD-10-CM

## 2012-07-16 DIAGNOSIS — D126 Benign neoplasm of colon, unspecified: Secondary | ICD-10-CM

## 2012-07-16 DIAGNOSIS — Z932 Ileostomy status: Secondary | ICD-10-CM

## 2012-07-16 NOTE — Patient Instructions (Signed)
We will set you up for j pouch testing in Jan

## 2012-07-16 NOTE — Progress Notes (Signed)
Subjective:     Patient ID: Cory Harper, male   DOB: 12/30/1955, 56 y.o.   MRN: [id number]  HPI   Cory Harper  July 19, 1956 [id number]  Patient Care Team: [name], MD as PCP - General (Family Medicine) Rachael Fee, MD as Consulting Physician (Gastroenterology) [name], MD as Consulting Physician (General Surgery)  This patient is a 56 y.o.male who presents today for surgical evaluation.   PROCEDURE PERFORMED 05/20/2012 1. Laparoscopically-assisted total proctocolectomy.  2. Ileal J pouch - anal anastomosis.  3. Diverting loop ileostomy.  4. Excision of anal canal polyp.  5. Left thyroid lobectomy.    The patient comes in today with his wife feeling even better.   No fevers or chills.  Urinating fine.  No lightheadedness or dizziness.  Walking well.   No perianal irritation.  Rare drainage.  No blood.    Emptying the ileostomy bag 5-6 times a day when 1/4-1/3 full.  Down to using Imodium 1 pill q2-3days.  Weaned off IVF boluses No shortness of breath.  No difficulty with swallowing.  Back to singing in church.  Talking well.  Feels better overall.   Patient Active Problem List  Diagnosis  . DIABETES MELLITUS, TYPE II  . Obesity (BMI 30-39.9)  . HYPERTENSION, BENIGN ESSENTIAL  . ECZEMA  . Familial adenomatous polyposis coli, s/p proctocolectomy/J pouch 05/20/2012  . VITAMIN B12 DEFICIENCY  . UNSPECIFIED VITAMIN D DEFICIENCY  . Pure hypercholesterolemia  . 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 Anal polyp s/p polypectomy  . Ileostomy in place  . Hyponatremia with ileostomy  . Hypothyroidism    Past Medical History  Diagnosis Date  . Diabetes mellitus   . Colon polyps     he had declined f/u colonoscopy after inital colonoscopy  . Hypertension   . Arthritis   . Nasal congestion   . Colonic mass   . Hyperlipidemia   . Heart murmur     Past Surgical History  Procedure  Date  . Knee arthroscopy 1998    Right knee  . Upper gastrointestinal endoscopy   . Wisdom tooth extraction   . Colon resection 05/20/2012    Procedure: COLON RESECTION LAPAROSCOPIC;  Surgeon: Ardeth Sportsman, MD;  Location: [facility];  Service: General;  Laterality: N/A;  Laparoscopic Proctocolectomy, Ileal Pouch Anal Anastomoisis, Loop Ileostomy  . Bowel resection 05/20/2012  . Thyroid lobectomy 05/20/2012    Procedure: THYROID LOBECTOMY;  Surgeon: Ardeth Sportsman, MD;  Location: [facility];  Service: General;  Laterality: Left;  LEFT THYROID LOBECTOMY  . Ileostomy 05/20/12    History   Social History  . Marital Status: Married    Spouse Name: June    Number of Children: 0  . Years of Education: N/A   Occupational History  . 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 Smoking status: Former Smoker    Quit date: 08/07/1991  . Smokeless tobacco: Never Used     Comment: quit in 1993  . Alcohol Use: No  . Drug Use: No  . Sexually Active: Not on file   Other Topics Concern  . 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 Uterine cancer Mother   . Hypertension Mother   . Stroke Mother   . Colon cancer Mother   . Colon polyps Mother   . Cancer Mother     colon, endometrial  . Diabetes Father   . Obesity Father   . Pneumonia Father   . Cancer Father     skin - squamous   . Diabetes Paternal Grandmother   . Prostate cancer Neg Hx     Current Outpatient Prescriptions  Medication Sig Dispense Refill  . ACCU-CHEK AVIVA PLUS test strip       . acetaminophen (TYLENOL) 500 MG tablet Take 1,000 mg by mouth every 6 (six) hours as needed. For pain      . cholecalciferol (VITAMIN D) 1000 UNITS tablet Take 1,000 Units by mouth 2 (two) times daily at 10  AM and 5 PM.       . CRESTOR 10 MG tablet take 1 tablet by mouth at bedtime  30 tablet  11  . fluticasone (FLONASE) 50 MCG/ACT nasal spray Place 2 sprays into the nose daily.  16 g  12  . glimepiride (AMARYL) 2 MG tablet Take 1 tablet (2 mg total) by mouth 2 (two) times daily.  60 tablet  12  . levothyroxine (SYNTHROID) 100 MCG tablet Take 1 tablet (100 mcg total) by mouth daily.  30 tablet  5  . loperamide (IMODIUM A-D) 2 MG tablet Take 1-2 tablets (2-4 mg total) by mouth 4 (four) times daily as needed for diarrhea or loose stools (use if emptying ileostomy more than 6x/day).  40 tablet  2  . loratadine (CLARITIN) 10 MG tablet Take 10 mg by mouth daily as needed.      . metFORMIN (GLUCOPHAGE) 1000 MG tablet Take 1 tablet (1,000 mg total) by mouth 2 (two) times daily with a meal.  60 tablet  12  . saxagliptin HCl (ONGLYZA) 5 MG TABS tablet Take 5 mg by mouth daily with supper.       . vitamin B-12 (CYANOCOBALAMIN) 1000 MCG tablet Take 500 mcg by mouth daily.          Allergies  Allergen Reactions  . Ibuprofen     Joint swelling    BP 122/62  Pulse 74  Temp 97.8 F (36.6 C) (Temporal)  Resp 16  Ht 5\' 10"  (1.778 m)  Wt 232 lb (105.235 kg)  BMI 33.29 kg/m2  REPORT OF SURGICAL PATHOLOGY FINAL DIAGNOSIS Diagnosis 1. Colon, segmental resection for tumor - COLONIC ADENOCARCINOMA EXTENDING INTO THE PERICECAL CONNECTIVE TISSUE. - MARGINS NOT INVOLVED. - FORTY-NINE BENIGN LYMPH NODES (0/49). - MULTIPLE ADENOMATOUS POLYPS. - BENIGN APPENDIX WITH FIBROUS OBLITERATION OF THE LUMEN. 2. Rectum, resection, stitch marks proximal end - ADENOMATOUS POLYPS. - MARGINS NOT INVOLVED. - FIVE BENIGN LYMPH NODES (0/5). 3. Polyp, anal - BENIGN ANORECTAL POLYP. - NO ADENOMATOUS CHANGE, DYSPLASIA OR MALIGNANCY. 4. Colon, resection margin (donut), blue stitch ileol pouch: non-stitch distal rectum - BENIGN ILEUM. - BENIGN ANORECTAL TISSUE. - NO ADENOMATOUS CHANGE OR MALIGNANCY. 5. Thyroid, lobectomy,  left - HURTHLE CELL NEOPLASM. - MARGINS NOT INVOLVED. Microscopic Comment 1. COLON AND RECTUM Specimen: Abdominal colon Procedure: Resection Tumor site: Cecum at level of ileocecal valve Specimen integrity: Intact Macroscopic tumor perforation: No Invasive tumor: Maximum size: 5 cm Histologic type(s): Colorectal adenocarcinoma with partial mucinous features 1 of 3 FINAL for KAYHAN, BOARDLEY ([id number]) Microscopic Comment(continued) Histologic grade and differentiation: G2: moderately differentiated/low grade Microscopic extension  of invasive tumor: Into pericecal connective tissue Lymph-Vascular invasion: Not identified Peri-neural invasion: Not identified Tumor deposit(s) (discontinuous extramural extension): No Resection margins: Proximal margin: Free of tumor Distal margin: Free of tumor Circumferential (radial) (posterior ascending, posterior descending; lateral and posterior mid-rectum; and entire lower 1/3 rectum): Free of tumor Treatment effect (neoadjuvant therapy): No Number of lymph nodes examined 49 (plus 5 benign nodes form part 2, rectum) ; Number positive 0 Additional polyp(s): Numerous adenomatous polyps Non-neoplastic findings: Fibrous obliteration of appendiceal lumen Pathologic Staging: pT3, pN0, pMX Ancillary studies: Can be performed if requested. Case discussed with Dr. Michaell Cowing on 05/23/12. (JDP:kh 05-22-12) 5. The nodule is composed of follicules which are lined by Hurthle cells. The peripheral capsule is variable and focally inapparent. No areas of capsular or vascular invasion are identified and the findings are consistent with low grade Hurthle cell neoplasm. Dr. Colonel Bald has reviewed part 5 of this case and agrees. Jimmy Picket MD Pathologist, Electronic Signature (Case signed 05/26/2012) Specimen Eden Rho and Clinical Information Specimen(s) Obtained: 1. Colon, segmental resection for tumor 2. Rectum, resection, stitch marks proximal end 3. Polyp,  anal 4. Colon, resection margin (donut), blue stitch ileol pouch: non-stitch distal rectum 5. Thyroid, lobectomy, left Specimen Clinical Information 1. cecal mass, FAP, thyroid nodule, (je) 5. hx of thyroid mass (je) [place]ta Harper 1. Specimen: Abdominal colon, received in formalin Specimen integrity: Intact Specimen length: The specimen includes 4 cm of terminal ileum and 118 cm of colon Tumor location: The primary lesion is located in the cecum at the level of the ileocecal valve Tumor size: The primary lesion consists of a sessile, ulcerated red brown mass measuring 5 x 4.2 x 1.2 cm Percent of bowel circumference involved: 70% Tumor distance to margins: Proximal: 4 cm Distal: 109 cm Macroscopic extent of tumor invasion: Tumor invades through the muscularis propria into the subserosal adipose tissue or the non-peritonealized pericolic or perirectal soft tissues but does not involve the visceral (serosa): 2 of 3 FINAL for LORAS, GRIESHOP ([id number]) Maverick Patman(continued) Total presumed lymph nodes: There are 52 rubbery tan red ovoid nodules tentatively identified as lymph nodes varying in size from 0.3 to 1.4 cm in greatest dimension Extramural satellite tumor nodules: Not grossly identified Mucosal polyp(s): There are innumerable polyps present throughout the entire segment varying in size from 0.2 to 1.2 cm. The cecum and proximal ascending colon is virtually carpeted with polyps. The polyps grossly appear to be superficial without invasion grossly identified. Additional findings: The remainder of the uninvolved mucosa is glistening and tan. The appendix is present and measures 6.8 cm in length x 0.7 cm in diameter. Block summary: 29 blocks submitted A = proximal margin B = distal margin C = proximal cecal tumor to uninvolved mucosa transition D = distal cecal tumor to uninvolved mucosa transition E, F = deep cecal extension G = tissue for molecular testing H - R = sections of polyps  sequentially labeled from proximal to distal S = appendix T - Z = 38 lymph nodes from proximal half of segment ZA, ZB, ZC = 14 lymph nodes from distal half of segment (GRP:caf 05/21/12) 2. Received in formalin is a previously opened segment of colon, clinically rectum, which measures 10 cm in length. The attached mesorectum is intact. There is a suture attached marking the distal margin. The mucosa is glistening and tan. There are six sessile soft tan mucosal polyps present measuring 0.4 to 0.6 cm in greatest dimension. The polyps extend to within 1 cm of the proximal margin  and 1 cm of the distal margin. Within the attached fat there are eight rubbery tan red ovoid nodules tentatively identified as lymph nodes measuring 0.3 to 0.7 cm in greatest dimension. Sections are submitted in seven cassettes. A = proximal margin B = distal margin C, D, E = polyps F, G = lymph nodes 3. Received in formalin is a 1 x 0.5 x 0.5 cm rubbery tan polypoid portion of mucosa. The base is inked and the specimen is sectioned and entirely submitted in one cassette. 4. Received in formalin are two intestinal anastomotic rings, one of which is identified as the ileal pouch with a blue suture. The ring without a suture is identified as distal rectum. Each piece measures 2 cm in diameter and each is 1.2 cm in length. The mucosa is glistening and tan. Sections are submitted in two cassettes. A = sections from ileal ring B = sections from rectal ring 5. Received in formalin is a 4.6 x 2.7 x 2.1 cm, 8.5 gram portion of thyroid, clinically left lobe. There is a suture attached marking the superior aspect. The margins are inked prior to sectioning. At the inferior pole there is a 2.5 x 1.7 x 1.5 cm well circumscribed rubbery ovoid nodule. The cut surface of the nodule is mottled tan red to hemorrhagic. The surrounding parenchyma is homogeneous red brown. Sections are submitted in seven cassettes. A = isthmus margin B - G  = entire nodule (GRP:caf 05/21/12) Report signed out from the following location(s) [facility] [address], [place], [place] [address]. CLIA #: [id number],   Review of Systems  Constitutional: Negative for fever, chills, diaphoresis, appetite change and fatigue.  HENT: Negative for sore throat, mouth sores, trouble swallowing, neck pain, neck stiffness, voice change and sinus pressure.   Eyes: Negative for photophobia and visual disturbance.  Respiratory: Negative for choking and shortness of breath.   Cardiovascular: Negative for chest pain and palpitations.  Gastrointestinal: Negative for nausea, vomiting, abdominal pain, diarrhea, constipation, blood in stool, abdominal distention, anal bleeding and rectal pain.  Genitourinary: Negative for dysuria, urgency, difficulty urinating and testicular pain.  Musculoskeletal: Negative for myalgias, arthralgias and gait problem.  Skin: Negative for color change, pallor, rash and wound.  Neurological: Negative for dizziness, syncope, speech difficulty, weakness, light-headedness, numbness and headaches.  Hematological: Negative for adenopathy.  Psychiatric/Behavioral: Negative for hallucinations, confusion and agitation.        Objective:   Physical Exam  Constitutional: He is oriented to person, place, and time. He appears well-developed and well-nourished. No distress.  HENT:  Head: Normocephalic.  Mouth/Throat: Uvula is midline, oropharynx is clear and moist and mucous membranes are normal. No oropharyngeal exudate, posterior oropharyngeal edema or posterior oropharyngeal erythema.       No hoarseness.  Speaking well  Eyes: Conjunctivae normal and EOM are normal. Pupils are equal, round, and reactive to light. No scleral icterus.  Neck: Normal range of motion. No tracheal deviation present.    Cardiovascular: Normal rate, normal heart sounds and intact distal pulses.   Pulmonary/Chest: Effort normal. No respiratory  distress.  Abdominal: Soft. He exhibits no distension and no mass. There is no tenderness. There is no rebound, no guarding and no CVA tenderness. No hernia. Hernia confirmed negative in the ventral area, confirmed negative in the right inguinal area and confirmed negative in the left inguinal area.         Incisions clean with normal healing ridges.  No hernias  Genitourinary:  Exam done with assistance of male Medical Assistant in the room.  Perianal skin clean with good hygiene.  Mild rash.  Thick perianal ointment on with mild irritation.    No external skin tags / hemorrhoids of significance.  No pilonidal disease.  No fissure.  No abscess/fistula.  Normal sphincter tone.      Musculoskeletal: Normal range of motion. He exhibits no tenderness.  Neurological: He is alert and oriented to person, place, and time. No cranial nerve deficit. He exhibits normal muscle tone. Coordination normal.  Skin: Skin is warm and dry. No rash noted. He is not diaphoretic.  Psychiatric: He has a normal mood and affect. His behavior is normal.       Assessment:     2 month status post abdominal colectomy, ileal J pouch, diverting loop ileostomy and left thyroid lobectomy.  Recovering well     Plan:     Activity as tolerated to regular activity.  Do not push through pain.  Diet as tolerated. Bowel regimen to avoid problems.  Continue Imodium PRN as that seems to keep the consistency well.  Continue outpatient ostomy care through [facility] supply.  Page Spiro has been a godsend for them.    Stay hydrated with plenty of liquids to avoid dehydration.  Try to drink electrolyte-based fluids such as Gatorade.  I stressed some supplemental shakes as well.  Keep stoma for at least three months.  See Dr. Maisie Fus for probable enema/flexible endoscopy evaluation to make sure J pouch is well healed prior to ileostomy takedown.  The anatomy & physiology of the digestive tract was discussed.  The  pathophysiology was discussed.  Possibility of remaining with an ostomy permanently was discussed.  I offered ostomy takedown.  Laparoscopic & open techniques were discussed.   Risks such as bleeding, infection, abscess, leak, reoperation, possible re-ostomy, injury to other organs, hernia, heart attack, death, and other risks were discussed.   I noted a good likelihood this will help address the problem.  Goals of post-operative recovery were discussed as well.  We will work to minimize complications.  Questions were answered.  The patient expresses understanding & wishes to proceed with surgery.  Hurthle cell neoplasm - Low malignant potential.  Remaining right thyroid lobe followed with an ultrasound in a year, perhaps annually as well.  Hold off on completion thyroidectomy.  I am glad his voice is back to normal.  Followup with primary care physician for other health issues as would normally be done.  Questions answered.  The patient & his wife expressed understanding and appreciation

## 2012-07-16 NOTE — Progress Notes (Signed)
Patient seen and J pouch care discussed.  We talked about the common problems with j pouches and how to manage them.  We will get him set up for gastrograffin enema and pouchoscopy in early Jan and hopefully ileostomy takedown in mid Jan. 

## 2012-07-16 NOTE — Patient Instructions (Signed)
Ileal Pouch-Anal Anastomosis (IPAA) Care After Refer to this sheet in the next few weeks. These discharge instructions provide you with general information on caring for yourself after you leave the hospital. Your caregiver may also give you specific instructions. Your treatment has been planned according to the most current medical practices available, but unavoidable complications sometimes occur. If you have any problems or questions after discharge, please call your caregiver. The ileal pouch-anal anastomosis (IPAA) procedure creates a reservoir for stool after the colon and rectum have been removed (colectomy) due to disease. The operation is a 1 to 3 stage procedure. In the initial stage(s) a J-pouch is created using a portion of the small intestine (ileum) in a "J" shape and is connected to the anus (end of the rectum). A temporary outside belly (abdominal) opening for removing waste (ileostomy) may be created. In the final stage, the ileostomy is reversed. After this, waste can move from the small intestine, through the new J-pouch, and out through the anus. Recovery from the final stage of the surgery and adapting to the new J-pouch can take several months or up to 1 year. HOME CARE INSTRUCTIONS  Everyone recovers at a different pace. Proper self-care will help you recover fully and return to your normal activities. Frequent stools are normal upon discharge following both stages of the procedure. This will taper off over time to about 4 to 6 movements per day. This happens as the pouch stretches out and starts to hold more stool. Follow your caregiver's specific instructions as well as these steps. After the initial stage of surgery:  Take it easy. Rest often. Give yourself time to adjust to having the temporary opening (stoma) in the abdomen. Give your incisions and new pouch time to heal.  Follow your caregiver's instructions for monitoring your stoma, proper skin care, odor control, and  changing the stoma device.  Follow your caregiver's dietary instructions. Your caregiver will help you to understand what foods may cause blockage, increase bowel movements, slow bowel movements, and cause skin irritation or gas. Remember these tips:  Chew your foods thoroughly.  Gradually introduce new foods one at a time.  Avoid drinking lots of fluids with your meals.  Drink plenty of fluids between meals. Drink enough water and fluids to keep your urine clear or pale yellow.  Do not eat high-fiber foods on an empty stomach.  Eat at regular intervals. Do not skip meals to slow your output.  Take pain or other medicines as directed.  Record your diet and bowel movements in a chart as directed.  Continue with Kegel exercises as directed.  Follow up with your caregiver as directed. After the final stage of surgery:  Take it easy. Rest often to give your cut (incision) and repairs time to heal.  Take pain or other medicines as directed.  Follow a low-fiber diet for 2 to 3 weeks, or as directed. Then, gradually return to a normal diet.  Eat meals on a regular schedule and chew thoroughly.  Avoid spicy and gassy foods in the beginning.  Regular amounts of sodium are okay.  Increase your potassium through foods such as meat, fish, bananas, and sweet potatoes to prevent or treat diarrhea.  Avoid an excess of foods high in sugar.  Take bulking agents or antidiarrheal medicines as directed. These will help to firm up and reduce your bowel movements. You will learn how to control your bowel movements over time with medicine and diet.  Drink enough water and  fluids to keep your urine clear or pale yellow.  Some nighttime leakage is normal at first. This will improve over time. Wear a pad at night until this problem goes away.  Continue proper skin care around the anus.  Use only soft cotton balls with warm water to wipe the anal area.  Change pads or panty liners  frequently.  Wear cotton undergarments.  Follow your caregiver's instructions for skin barriers and soothing baths sitting in warm water (sitz).  Avoid spicy foods, coffee, and tea if you experience irritation.  Record your bowel movements in a chart as directed.  Continue with Kegel exercises as directed.  Follow up with your caregiver as directed. Other things to keep in mind (if they apply):  You may gradually resume most activities, including sexual activity, as directed. Ask your caregiver about birth control, as medicine may not be absorbed normally.  Menstrual cycles are commonly disrupted after surgery. You may be off schedule for up to 1 year.  Always discuss your medicines with your caregiver before taking them.  Do not take laxatives.  Wear a special medical alert bracelet to alert caregivers that you have had colectomy and pouch procedures. SEEK MEDICAL CARE IF:   You are having trouble caring for your stoma or using the ostomy supplies (changing the pouch).  You have an oral temperature above 102 F (38.9 C).  You develop chills.  You are feeling sick to your stomach (nauseous).  You are throwing up (vomiting).  You notice bleeding, skin irritation, drainage, redness, or pain around the anus or stoma.  You notice a change in the size or appearance of the stoma.  You have abdominal pain, bloating, pressure, or cramping.  Your stools do not become firmer.  Your stool frequency is not as expected by you and your caregiver (too high or too low).  You have frequent diarrhea.  You experience sexual dysfunction.  You experience shortness of breath, fatigue, thirst, dry mouth, or unusual sensations in the limbs. This is usually related to hydration or diet.  You have other new symptoms.  You have questions or concerns. SEEK IMMEDIATE MEDICAL CARE IF:   Abdominal pain does not go away or becomes severe.  You have an oral temperature above 102 F (38.9  C), not controlled by medicine.  Repeated vomiting occurs.  Stool is not draining through the stoma or being removed through the anus.  You have an irregular heartbeat or chest pain. MAKE SURE YOU:   Understand these instructions.  Will watch your condition.  Will get help right away if you are not doing well or get worse. FOR MORE INFORMATION: [employer] J[employer]ouch [employer]: www.j-pouch.org. [employer] (UOAA): www.ostomy.org. Document Released: 10/17/2009 Document Revised: 10/15/2011 Document Reviewed: 10/17/2009 [facility] Patient Information 2013 [place], [place].

## 2012-07-25 ENCOUNTER — Other Ambulatory Visit (INDEPENDENT_AMBULATORY_CARE_PROVIDER_SITE_OTHER): Payer: Self-pay

## 2012-07-25 DIAGNOSIS — D126 Benign neoplasm of colon, unspecified: Secondary | ICD-10-CM

## 2012-08-01 ENCOUNTER — Ambulatory Visit
Admission: RE | Admit: 2012-08-01 | Discharge: 2012-08-01 | Disposition: A | Discharge status: Home / Self Care | Payer: BC Managed Care – PPO | Source: Ambulatory Visit | Attending: General Surgery | Admitting: General Surgery

## 2012-08-01 ENCOUNTER — Ambulatory Visit (HOSPITAL_COMMUNITY)
Admission: RE | Admit: 2012-08-01 | Discharge: 2012-08-01 | Disposition: A | Discharge status: Home / Self Care | Payer: BC Managed Care – PPO | Source: Ambulatory Visit | Attending: General Surgery | Admitting: General Surgery

## 2012-08-01 ENCOUNTER — Encounter (HOSPITAL_COMMUNITY): Payer: Self-pay | Admitting: *Deleted

## 2012-08-01 ENCOUNTER — Encounter (HOSPITAL_COMMUNITY)
Admission: RE | Disposition: A | Discharge status: Home / Self Care | Payer: Self-pay | Source: Ambulatory Visit | Attending: General Surgery

## 2012-08-01 DIAGNOSIS — Z9049 Acquired absence of other specified parts of digestive tract: Secondary | ICD-10-CM | POA: Insufficient documentation

## 2012-08-01 DIAGNOSIS — D126 Benign neoplasm of colon, unspecified: Secondary | ICD-10-CM

## 2012-08-01 DIAGNOSIS — Z9089 Acquired absence of other organs: Secondary | ICD-10-CM | POA: Insufficient documentation

## 2012-08-01 DIAGNOSIS — Z432 Encounter for attention to ileostomy: Secondary | ICD-10-CM

## 2012-08-01 HISTORY — PX: FLEXIBLE SIGMOIDOSCOPY: SHX5431

## 2012-08-01 HISTORY — DX: Malignant (primary) neoplasm, unspecified: C80.1

## 2012-08-01 SURGERY — SIGMOIDOSCOPY, FLEXIBLE
Anesthesia: Moderate Sedation

## 2012-08-01 MED ORDER — FENTANYL CITRATE 0.05 MG/ML IJ SOLN
INTRAMUSCULAR | Status: AC
  Filled 2012-08-01: qty 2

## 2012-08-01 MED ORDER — MIDAZOLAM HCL 10 MG/2ML IJ SOLN
INTRAMUSCULAR | Status: AC
  Filled 2012-08-01: qty 2

## 2012-08-01 NOTE — Interval H&P Note (Signed)
History and Physical Interval Note:  08/01/2012 12:29 PM  Cory Harper  has presented today for surgery, with the diagnosis of j pouch for afp  The various methods of treatment have been discussed with the patient and family. After consideration of risks, benefits and other options for treatment, the patient has consented to  Procedure(s) (LRB) with comments: Pouchoscopy - use endoscope as a surgical intervention .  The patient's history has been reviewed, patient examined, no change in status, stable for surgery.  I have reviewed the patient's chart and labs.  Questions were answered to the patient's satisfaction.     Vanita Panda, MD  Colorectal and General Surgery [facility]

## 2012-08-01 NOTE — Op Note (Signed)
08/01/2012  1:06 PM  PATIENT:  Cory Harper  56 y.o. 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 He is approximately 2-1/2 months status post surgery. He presents today for evaluation of the J-pouch for possible closure of his ileostomy.  OR FINDINGS: J-pouch intact with no signs of chronic inflammation. Perianal staple line appears intact with no sinuses detected.  DESCRIPTION: the patient was brought to the endoscopy room and laid on his side in lateral decubitus position. A surgical timeout was performed indicating the correct patient procedure and positioning. I began with the external exam which showed no signs of pathology. I then performed a digital rectal exam for which the staple line could be palpated approximately 3-4 cm inside the anal canal. The staple line appeared intact. The patient did not have any pain to palpation indicating any infection. I then placed an endoscope into the patient's J-pouch. The J-pouch was insufflated and no signs of inflammation were identified. I advanced the scope to the level of the ileostomy. The entire small bowel that I examined was without inflammation. The staple lines appeared intact. This is consistent with a normal exam. I then evaluated the anal anastomosis. There were a couple staples that were working through and visible. There were no signs of inflammation along the staple line. This was also consistent with a normal exam. In looking at his  Gastrografin enema and he is pouchoscopy exam I find no reason why the patient can't undergo ileostomy reversal. I will discuss this with Dr. Michaell Cowing. At the completion of the procedure the patient was transported back to the holding area in stable condition.

## 2012-08-01 NOTE — H&P (View-Only) (Signed)
Patient seen and J pouch care discussed.  We talked about the common problems with j pouches and how to manage them.  We will get him set up for gastrograffin enema and pouchoscopy in early Jan and hopefully ileostomy takedown in mid Jan.

## 2012-08-04 ENCOUNTER — Encounter (HOSPITAL_COMMUNITY): Payer: Self-pay | Admitting: General Surgery

## 2012-08-19 ENCOUNTER — Other Ambulatory Visit (INDEPENDENT_AMBULATORY_CARE_PROVIDER_SITE_OTHER): Payer: BC Managed Care – PPO

## 2012-08-19 ENCOUNTER — Encounter: Payer: Self-pay | Admitting: *Deleted

## 2012-08-19 DIAGNOSIS — E039 Hypothyroidism, unspecified: Secondary | ICD-10-CM

## 2012-08-19 DIAGNOSIS — E119 Type 2 diabetes mellitus without complications: Secondary | ICD-10-CM

## 2012-08-19 NOTE — Addendum Note (Signed)
Addended by: [name] on: 08/19/2012 02:56 PM   Modules accepted: Orders

## 2012-08-27 ENCOUNTER — Encounter: Payer: Self-pay | Admitting: Gastroenterology

## 2012-09-01 ENCOUNTER — Encounter (HOSPITAL_COMMUNITY): Payer: Self-pay | Admitting: Pharmacy Technician

## 2012-09-04 ENCOUNTER — Telehealth (INDEPENDENT_AMBULATORY_CARE_PROVIDER_SITE_OTHER): Payer: Self-pay

## 2012-09-04 NOTE — Telephone Encounter (Signed)
Please send a note

## 2012-09-04 NOTE — Telephone Encounter (Signed)
Pt called stating he has summons to court one week after his surgery and needs letter stating he will not be able to go to court so soon after surgery. Pt request a call back when letter is ready to pick up. Pt can be reached at [phone].

## 2012-09-05 ENCOUNTER — Encounter (INDEPENDENT_AMBULATORY_CARE_PROVIDER_SITE_OTHER): Payer: Self-pay

## 2012-09-05 ENCOUNTER — Telehealth (INDEPENDENT_AMBULATORY_CARE_PROVIDER_SITE_OTHER): Payer: Self-pay

## 2012-09-05 NOTE — Telephone Encounter (Signed)
Pt returned my call about the letter for jury summons. I will type a letter excusing him from jury duty on 09/25/12 due to his sx on 09/18/12 by Dr Michaell Cowing. The pt will p/u the letter at the front desk on Monday.

## 2012-09-05 NOTE — Telephone Encounter (Signed)
LMOM for pt to call me so I can get some more info about the letter he is wanting [place] to do for his court date.

## 2012-09-09 ENCOUNTER — Ambulatory Visit (INDEPENDENT_AMBULATORY_CARE_PROVIDER_SITE_OTHER): Payer: BC Managed Care – PPO | Admitting: Family Medicine

## 2012-09-09 ENCOUNTER — Encounter: Payer: Self-pay | Admitting: Family Medicine

## 2012-09-09 ENCOUNTER — Inpatient Hospital Stay (HOSPITAL_COMMUNITY): Admission: RE | Admit: 2012-09-09 | Payer: BC Managed Care – PPO | Source: Ambulatory Visit

## 2012-09-09 ENCOUNTER — Telehealth: Payer: Self-pay

## 2012-09-09 VITALS — BP 142/64 | HR 78 | Temp 98.5°F | Wt 249.0 lb

## 2012-09-09 DIAGNOSIS — J069 Acute upper respiratory infection, unspecified: Secondary | ICD-10-CM

## 2012-09-09 MED ORDER — AZITHROMYCIN 250 MG PO TABS: ORAL_TABLET | ORAL | Status: DC

## 2012-09-09 NOTE — Progress Notes (Signed)
Upcoming surgery planned, noted.   duration of symptoms: recently, last few days Rhinorrhea: posterior but not anterior congestion:no ear pain:  no sore throat:yes, scratchy Cough: no cough but throat clearing myalgias:no No fevers.   Voice is hoarse.   He's already on flonase.    His sugar has been ~90-100 in AM last few days.    He'd have these in the past and they would usually self resolve after a few days.    ROS: See HPI.  Otherwise negative.    Meds, vitals, and allergies reviewed.   GEN: nad, alert and oriented HEENT: mucous membranes moist, TM w/o erythema, nasal epithelium injected, OP with cobblestoning, sinuses not ttp.   NECK: supple w/o LA CV: rrr. PULM: ctab, no inc wob EXT: no edema

## 2012-09-09 NOTE — Patient Instructions (Signed)
Cory Harper  09/09/2012   Your procedure is scheduled on:  09/18/12   Report to [facility] at   0515  AM.  Call this number if you have problems the morning of surgery: [phone]   Remember:   Do not eat food or drink liquids after midnight.   Take these medicines the morning of surgery with A SIP OF WATER:    Do not wear jewelry,   Do not wear lotions, powders, or perfumes. .   Men may shave face and neck.  Do not bring valuables to the hospital.  Contacts, dentures or bridgework may not be worn into surgery.  Leave suitcase in the car. After surgery it may be brought to your room.  For patients admitted to the hospital, checkout time is 11:00 AM the day of  discharge.    SEE CHG INSTRUCTION SHEET    Please read over the following fact sheets that you were given: MRSA Information, coughing and deep breathing exercises, leg exercises, Blood Transfusion Fact Sheet                Failure to comply with these instructions may result in cancellation of your surgery.                Patient Signature ____________________________              Nurse Signature _____________________________

## 2012-09-09 NOTE — Telephone Encounter (Signed)
Will see today.  

## 2012-09-09 NOTE — Assessment & Plan Note (Signed)
D/w pt about options.  He is nontoxic.  Lungs clear.  He has had these resolve in the past w/o abx.   I would hold the abx for now, to try to prevent diarrhea from abx exposure.  Start in ~2 days if not improved.  If his sx resolve, he should have no contraindication to surgery.   Will notify surgery as FYI.

## 2012-09-09 NOTE — Patient Instructions (Addendum)
I would keep using the flonase and drink plenty of water.  Hold the antibiotics and start them in 1-2 days if not improved.   If you aren't improving notify me and the surgery clinic.  Take care.

## 2012-09-09 NOTE — Telephone Encounter (Signed)
Pt scheduled for Ileostomy on 09/18/12 and request antibiotic for sinus drainage, scratchy throat, productive cough with yellow phlegm, and hoarseness,? Fever. Advised pt need be seen to see if antibiotic needed; pt scheduled appt today at 2 pm.

## 2012-09-10 ENCOUNTER — Encounter (HOSPITAL_COMMUNITY): Payer: Self-pay

## 2012-09-10 ENCOUNTER — Encounter (HOSPITAL_COMMUNITY)
Admission: RE | Admit: 2012-09-10 | Discharge: 2012-09-10 | Disposition: A | Discharge status: Home / Self Care | Payer: BC Managed Care – PPO | Source: Ambulatory Visit | Attending: Surgery | Admitting: Surgery

## 2012-09-10 HISTORY — DX: Hypothyroidism, unspecified: E03.9

## 2012-09-10 LAB — CBC
HCT: 32.6 % — ABNORMAL LOW (ref 39.0–52.0)
MCV: 89.3 fL (ref 78.0–100.0)
RBC: 3.65 MIL/uL — ABNORMAL LOW (ref 4.22–5.81)
WBC: 5.7 10*3/uL (ref 4.0–10.5)

## 2012-09-10 LAB — BASIC METABOLIC PANEL
BUN: 16 mg/dL (ref 6–23)
CO2: 25 mEq/L (ref 19–32)
Chloride: 104 mEq/L (ref 96–112)
Creatinine, Ser: 1.23 mg/dL (ref 0.50–1.35)

## 2012-09-10 NOTE — Progress Notes (Signed)
Patient has slight sinus infection at preop visit today.  Patient stated he saw PCP on 09/09/12 and has a prescription for Zpack which he has not picked up.  Patient states sinus infection usually improves in 3-4 days.  If not cleared by that time patient will get prescription filled and let PCP be aware along with surgeon.

## 2012-09-10 NOTE — Progress Notes (Signed)
Patient has screened positive for both MRSA and Staph on pcr screen at preop appointment.  Patient has been notified.  Thanks.

## 2012-09-10 NOTE — Progress Notes (Signed)
CBC results sent via EPIC to inbox of Dr Michaell Cowing.

## 2012-09-11 ENCOUNTER — Other Ambulatory Visit (INDEPENDENT_AMBULATORY_CARE_PROVIDER_SITE_OTHER): Payer: Self-pay | Admitting: Surgery

## 2012-09-11 ENCOUNTER — Telehealth (INDEPENDENT_AMBULATORY_CARE_PROVIDER_SITE_OTHER): Payer: Self-pay

## 2012-09-11 MED ORDER — VANCOMYCIN HCL 10 G IV SOLR: 1500.0000 mg | INTRAVENOUS | Status: DC

## 2012-09-11 NOTE — Telephone Encounter (Signed)
Take the Rx like his PCP recommended!

## 2012-09-11 NOTE — Telephone Encounter (Signed)
Pt calling b/c he has a sinus infection. He went and saw his pcp Dr Para March and he prescribed a Z-pack. The pt has not got it filled yet b/c he wanted to make sure it was ok for him to take before his surgery on 09/18/12 to have ileostomy takedown. The pt states his lungs are clear and he is not running a fever. Please advise if ok for pt to get filled to take now before his surgery next week.

## 2012-09-12 NOTE — Telephone Encounter (Signed)
Called pt back to let him know that Dr Michaell Cowing said it was ok to take the rx prescribed from his PCP. The pt said he thinks he doesn't need it now but will have it on hand just in case before his surgery.

## 2012-09-15 ENCOUNTER — Other Ambulatory Visit: Payer: Self-pay | Admitting: *Deleted

## 2012-09-15 MED ORDER — SAXAGLIPTIN HCL 5 MG PO TABS: 5.0000 mg | ORAL_TABLET | Freq: Every day | ORAL | Status: DC

## 2012-09-15 MED ORDER — ROSUVASTATIN CALCIUM 10 MG PO TABS: 10.0000 mg | ORAL_TABLET | Freq: Every day | ORAL | Status: DC

## 2012-09-18 ENCOUNTER — Ambulatory Visit (HOSPITAL_COMMUNITY): Payer: BC Managed Care – PPO | Admitting: Anesthesiology

## 2012-09-18 ENCOUNTER — Encounter (HOSPITAL_COMMUNITY)
Admission: RE | Disposition: A | Discharge status: Home / Self Care | Payer: Self-pay | Source: Ambulatory Visit | Attending: Surgery

## 2012-09-18 ENCOUNTER — Encounter (HOSPITAL_COMMUNITY): Payer: Self-pay | Admitting: *Deleted

## 2012-09-18 ENCOUNTER — Inpatient Hospital Stay (HOSPITAL_COMMUNITY)
Admission: RE | Admit: 2012-09-18 | Discharge: 2012-09-22 | DRG: 153 | Disposition: A | Discharge status: Home / Self Care | Payer: BC Managed Care – PPO | Source: Ambulatory Visit | Attending: Surgery | Admitting: Surgery

## 2012-09-18 ENCOUNTER — Encounter (HOSPITAL_COMMUNITY): Payer: Self-pay | Admitting: Anesthesiology

## 2012-09-18 DIAGNOSIS — D34 Benign neoplasm of thyroid gland: Secondary | ICD-10-CM | POA: Diagnosis present

## 2012-09-18 DIAGNOSIS — D1391 Familial adenomatous polyposis: Secondary | ICD-10-CM | POA: Diagnosis present

## 2012-09-18 DIAGNOSIS — C180 Malignant neoplasm of cecum: Secondary | ICD-10-CM

## 2012-09-18 DIAGNOSIS — Z432 Encounter for attention to ileostomy: Secondary | ICD-10-CM

## 2012-09-18 DIAGNOSIS — K620 Anal polyp: Secondary | ICD-10-CM | POA: Diagnosis present

## 2012-09-18 DIAGNOSIS — Z85038 Personal history of other malignant neoplasm of large intestine: Secondary | ICD-10-CM | POA: Diagnosis present

## 2012-09-18 DIAGNOSIS — E119 Type 2 diabetes mellitus without complications: Secondary | ICD-10-CM | POA: Diagnosis present

## 2012-09-18 DIAGNOSIS — D649 Anemia, unspecified: Secondary | ICD-10-CM | POA: Diagnosis present

## 2012-09-18 DIAGNOSIS — E785 Hyperlipidemia, unspecified: Secondary | ICD-10-CM | POA: Diagnosis present

## 2012-09-18 DIAGNOSIS — D126 Benign neoplasm of colon, unspecified: Secondary | ICD-10-CM

## 2012-09-18 DIAGNOSIS — E039 Hypothyroidism, unspecified: Secondary | ICD-10-CM | POA: Diagnosis present

## 2012-09-18 DIAGNOSIS — I10 Essential (primary) hypertension: Secondary | ICD-10-CM | POA: Diagnosis present

## 2012-09-18 DIAGNOSIS — Z932 Ileostomy status: Secondary | ICD-10-CM

## 2012-09-18 DIAGNOSIS — Z6835 Body mass index (BMI) 35.0-35.9, adult: Secondary | ICD-10-CM

## 2012-09-18 DIAGNOSIS — Z9049 Acquired absence of other specified parts of digestive tract: Secondary | ICD-10-CM

## 2012-09-18 DIAGNOSIS — E669 Obesity, unspecified: Secondary | ICD-10-CM | POA: Diagnosis present

## 2012-09-18 HISTORY — PX: ILEOSTOMY CLOSURE: SHX1784

## 2012-09-18 LAB — GLUCOSE, CAPILLARY
Glucose-Capillary: 99 mg/dL (ref 70–99)
Glucose-Capillary: 137 mg/dL — ABNORMAL HIGH (ref 70–99)
Glucose-Capillary: 144 mg/dL — ABNORMAL HIGH (ref 70–99)

## 2012-09-18 LAB — TYPE AND SCREEN: ABO/RH(D): O POS

## 2012-09-18 SURGERY — CLOSURE, ILEOSTOMY
Anesthesia: General | Site: Abdomen | Wound class: Contaminated

## 2012-09-18 MED ORDER — BUPIVACAINE-EPINEPHRINE PF 0.25-1:200000 % IJ SOLN
INTRAMUSCULAR | Status: DC | PRN
  Administered 2012-09-18: 20 mL

## 2012-09-18 MED ORDER — DEXTROSE 5 % IV SOLN
2.0000 g | Freq: Two times a day (BID) | INTRAVENOUS | Status: AC
  Administered 2012-09-18: 2 g via INTRAVENOUS
  Filled 2012-09-18: qty 2

## 2012-09-18 MED ORDER — ALUM & MAG HYDROXIDE-SIMETH 200-200-20 MG/5ML PO SUSP: 30.0000 mL | Freq: Four times a day (QID) | ORAL | Status: DC | PRN

## 2012-09-18 MED ORDER — VANCOMYCIN HCL 10 G IV SOLR
1500.0000 mg | INTRAVENOUS | Status: AC
  Administered 2012-09-18: 1500 mg via INTRAVENOUS
  Filled 2012-09-18: qty 1500

## 2012-09-18 MED ORDER — PROPOFOL 10 MG/ML IV BOLUS
INTRAVENOUS | Status: DC | PRN
  Administered 2012-09-18: 200 mg via INTRAVENOUS

## 2012-09-18 MED ORDER — LACTATED RINGERS IV SOLN
INTRAVENOUS | Status: DC | PRN
  Administered 2012-09-18 (×3): via INTRAVENOUS

## 2012-09-18 MED ORDER — LIDOCAINE HCL (CARDIAC) 20 MG/ML IV SOLN
INTRAVENOUS | Status: DC | PRN
  Administered 2012-09-18: 100 mg via INTRAVENOUS

## 2012-09-18 MED ORDER — LOPERAMIDE HCL 2 MG PO TABS: 2.0000 mg | ORAL_TABLET | Freq: Four times a day (QID) | ORAL | Status: DC | PRN

## 2012-09-18 MED ORDER — HEPARIN SODIUM (PORCINE) 5000 UNIT/ML IJ SOLN
5000.0000 [IU] | Freq: Once | INTRAMUSCULAR | Status: AC
  Administered 2012-09-18: 5000 [IU] via SUBCUTANEOUS
  Filled 2012-09-18: qty 1

## 2012-09-18 MED ORDER — LOPERAMIDE HCL 2 MG PO CAPS: 2.0000 mg | ORAL_CAPSULE | Freq: Four times a day (QID) | ORAL | Status: DC | PRN

## 2012-09-18 MED ORDER — ACETAMINOPHEN 10 MG/ML IV SOLN
INTRAVENOUS | Status: AC
  Filled 2012-09-18: qty 100

## 2012-09-18 MED ORDER — OXYCODONE HCL 5 MG PO TABS: 5.0000 mg | ORAL_TABLET | ORAL | Status: DC | PRN

## 2012-09-18 MED ORDER — LORATADINE 10 MG PO TABS
10.0000 mg | ORAL_TABLET | Freq: Every day | ORAL | Status: DC | PRN
  Filled 2012-09-18: qty 1

## 2012-09-18 MED ORDER — BLISTEX EX OINT: TOPICAL_OINTMENT | CUTANEOUS | Status: DC | PRN

## 2012-09-18 MED ORDER — HEPARIN SODIUM (PORCINE) 5000 UNIT/ML IJ SOLN
5000.0000 [IU] | Freq: Three times a day (TID) | INTRAMUSCULAR | Status: DC
  Administered 2012-09-18 – 2012-09-22 (×11): 5000 [IU] via SUBCUTANEOUS
  Filled 2012-09-18 (×14): qty 1

## 2012-09-18 MED ORDER — NEOSTIGMINE METHYLSULFATE 1 MG/ML IJ SOLN
INTRAMUSCULAR | Status: DC | PRN
  Administered 2012-09-18: 4 mg via INTRAVENOUS

## 2012-09-18 MED ORDER — LIP MEDEX EX OINT
TOPICAL_OINTMENT | CUTANEOUS | Status: AC
  Administered 2012-09-18
  Filled 2012-09-18: qty 7

## 2012-09-18 MED ORDER — SUFENTANIL CITRATE 50 MCG/ML IV SOLN
INTRAVENOUS | Status: DC | PRN
  Administered 2012-09-18 (×5): 10 ug via INTRAVENOUS

## 2012-09-18 MED ORDER — ALVIMOPAN 12 MG PO CAPS
12.0000 mg | ORAL_CAPSULE | Freq: Two times a day (BID) | ORAL | Status: DC
  Administered 2012-09-19 – 2012-09-20 (×3): 12 mg via ORAL
  Filled 2012-09-18 (×4): qty 1

## 2012-09-18 MED ORDER — OXYCODONE HCL 5 MG/5ML PO SOLN
5.0000 mg | Freq: Once | ORAL | Status: DC | PRN
  Filled 2012-09-18: qty 5

## 2012-09-18 MED ORDER — LINAGLIPTIN 5 MG PO TABS
5.0000 mg | ORAL_TABLET | Freq: Every day | ORAL | Status: DC
  Administered 2012-09-18 – 2012-09-19 (×2): 5 mg via ORAL
  Filled 2012-09-18 (×4): qty 1

## 2012-09-18 MED ORDER — MIDAZOLAM HCL 5 MG/5ML IJ SOLN
INTRAMUSCULAR | Status: DC | PRN
  Administered 2012-09-18: 2 mg via INTRAVENOUS

## 2012-09-18 MED ORDER — LEVOTHYROXINE SODIUM 100 MCG PO TABS
100.0000 ug | ORAL_TABLET | Freq: Every day | ORAL | Status: DC
  Administered 2012-09-19 – 2012-09-22 (×4): 100 ug via ORAL
  Filled 2012-09-18 (×5): qty 1

## 2012-09-18 MED ORDER — ACETAMINOPHEN 10 MG/ML IV SOLN
INTRAVENOUS | Status: DC | PRN
  Administered 2012-09-18: 1000 mg via INTRAVENOUS

## 2012-09-18 MED ORDER — HYDROMORPHONE HCL PF 1 MG/ML IJ SOLN
INTRAMUSCULAR | Status: AC
  Administered 2012-09-18: 0.5 mg via INTRAVENOUS
  Administered 2012-09-18: 0.5 mg
  Filled 2012-09-18: qty 1

## 2012-09-18 MED ORDER — FLUTICASONE PROPIONATE 50 MCG/ACT NA SUSP
2.0000 | Freq: Every day | NASAL | Status: DC
Start: 2012-09-19 — End: 2012-09-22
  Administered 2012-09-19 – 2012-09-21 (×3): 2 via NASAL
  Filled 2012-09-18: qty 16

## 2012-09-18 MED ORDER — METOCLOPRAMIDE HCL 5 MG/ML IJ SOLN: 10.0000 mg | Freq: Once | INTRAMUSCULAR | Status: DC | PRN

## 2012-09-18 MED ORDER — FERROUS SULFATE 325 (65 FE) MG PO TABS
325.0000 mg | ORAL_TABLET | Freq: Two times a day (BID) | ORAL | Status: DC
  Administered 2012-09-18: 325 mg via ORAL
  Filled 2012-09-18 (×4): qty 1

## 2012-09-18 MED ORDER — DIPHENHYDRAMINE HCL 12.5 MG/5ML PO ELIX: 12.5000 mg | ORAL_SOLUTION | Freq: Four times a day (QID) | ORAL | Status: DC | PRN

## 2012-09-18 MED ORDER — CYANOCOBALAMIN 500 MCG PO TABS
500.0000 ug | ORAL_TABLET | Freq: Every day | ORAL | Status: DC
  Administered 2012-09-18 – 2012-09-21 (×4): 500 ug via ORAL
  Filled 2012-09-18 (×5): qty 1

## 2012-09-18 MED ORDER — HYDROMORPHONE HCL PF 1 MG/ML IJ SOLN
INTRAMUSCULAR | Status: AC
  Filled 2012-09-18: qty 1

## 2012-09-18 MED ORDER — ALVIMOPAN 12 MG PO CAPS
12.0000 mg | ORAL_CAPSULE | Freq: Once | ORAL | Status: AC
  Administered 2012-09-18: 12 mg via ORAL
  Filled 2012-09-18 (×2): qty 1

## 2012-09-18 MED ORDER — GLYCOPYRROLATE 0.2 MG/ML IJ SOLN
INTRAMUSCULAR | Status: DC | PRN
  Administered 2012-09-18: 0.6 mg via INTRAVENOUS

## 2012-09-18 MED ORDER — SACCHAROMYCES BOULARDII 250 MG PO CAPS
250.0000 mg | ORAL_CAPSULE | Freq: Two times a day (BID) | ORAL | Status: DC
  Administered 2012-09-18 – 2012-09-21 (×8): 250 mg via ORAL
  Filled 2012-09-18 (×10): qty 1

## 2012-09-18 MED ORDER — SODIUM CHLORIDE 0.9 % IV SOLN
INTRAVENOUS | Status: AC
  Administered 2012-09-18: 09:00:00 via INTRAPERITONEAL
  Filled 2012-09-18: qty 6

## 2012-09-18 MED ORDER — OXYCODONE HCL 5 MG PO TABS: 5.0000 mg | ORAL_TABLET | Freq: Once | ORAL | Status: DC | PRN

## 2012-09-18 MED ORDER — ONDANSETRON HCL 4 MG/2ML IJ SOLN
INTRAMUSCULAR | Status: DC | PRN
  Administered 2012-09-18: 4 mg via INTRAVENOUS

## 2012-09-18 MED ORDER — BUPIVACAINE-EPINEPHRINE 0.25% -1:200000 IJ SOLN
INTRAMUSCULAR | Status: AC
  Filled 2012-09-18: qty 1

## 2012-09-18 MED ORDER — KCL IN DEXTROSE-NACL 40-5-0.9 MEQ/L-%-% IV SOLN
INTRAVENOUS | Status: DC
  Administered 2012-09-18 – 2012-09-20 (×4): via INTRAVENOUS
  Filled 2012-09-18 (×6): qty 1000

## 2012-09-18 MED ORDER — HYDROMORPHONE HCL PF 1 MG/ML IJ SOLN
0.5000 mg | INTRAMUSCULAR | Status: DC | PRN
  Administered 2012-09-18: 1 mg via INTRAVENOUS
  Administered 2012-09-18 – 2012-09-19 (×2): 2 mg via INTRAVENOUS
  Filled 2012-09-18: qty 1
  Filled 2012-09-18 (×2): qty 2

## 2012-09-18 MED ORDER — INFLUENZA VIRUS VACC SPLIT PF IM SUSP
0.5000 mL | INTRAMUSCULAR | Status: AC
  Filled 2012-09-18 (×2): qty 0.5

## 2012-09-18 MED ORDER — LACTATED RINGERS IV SOLN: INTRAVENOUS | Status: DC

## 2012-09-18 MED ORDER — ROCURONIUM BROMIDE 100 MG/10ML IV SOLN
INTRAVENOUS | Status: DC | PRN
  Administered 2012-09-18: 60 mg via INTRAVENOUS
  Administered 2012-09-18: 20 mg via INTRAVENOUS

## 2012-09-18 MED ORDER — ZOLPIDEM TARTRATE 5 MG PO TABS: 5.0000 mg | ORAL_TABLET | Freq: Every evening | ORAL | Status: DC | PRN

## 2012-09-18 MED ORDER — INSULIN ASPART 100 UNIT/ML ~~LOC~~ SOLN
0.0000 [IU] | Freq: Three times a day (TID) | SUBCUTANEOUS | Status: DC
  Administered 2012-09-18: 2 [IU] via SUBCUTANEOUS
  Administered 2012-09-19: 5 [IU] via SUBCUTANEOUS
  Administered 2012-09-19: 3 [IU] via SUBCUTANEOUS
  Administered 2012-09-19: 5 [IU] via SUBCUTANEOUS
  Administered 2012-09-20: 2 [IU] via SUBCUTANEOUS
  Administered 2012-09-20: 3 [IU] via SUBCUTANEOUS
  Administered 2012-09-20: 2 [IU] via SUBCUTANEOUS
  Administered 2012-09-21: 3 [IU] via SUBCUTANEOUS
  Administered 2012-09-21: 2 [IU] via SUBCUTANEOUS

## 2012-09-18 MED ORDER — PROMETHAZINE HCL 25 MG/ML IJ SOLN: 6.2500 mg | Freq: Four times a day (QID) | INTRAMUSCULAR | Status: DC | PRN

## 2012-09-18 MED ORDER — DIPHENHYDRAMINE HCL 50 MG/ML IJ SOLN: 12.5000 mg | Freq: Four times a day (QID) | INTRAMUSCULAR | Status: DC | PRN

## 2012-09-18 MED ORDER — ACETAMINOPHEN 500 MG PO TABS: 1000.0000 mg | ORAL_TABLET | Freq: Four times a day (QID) | ORAL | Status: DC | PRN

## 2012-09-18 MED ORDER — METOPROLOL TARTRATE 1 MG/ML IV SOLN
5.0000 mg | Freq: Four times a day (QID) | INTRAVENOUS | Status: DC | PRN
  Filled 2012-09-18: qty 5

## 2012-09-18 MED ORDER — HYDROMORPHONE HCL PF 1 MG/ML IJ SOLN
0.2500 mg | INTRAMUSCULAR | Status: DC | PRN
  Administered 2012-09-18 (×3): 0.5 mg via INTRAVENOUS

## 2012-09-18 MED ORDER — LIP MEDEX EX OINT: TOPICAL_OINTMENT | CUTANEOUS | Status: DC | PRN

## 2012-09-18 SURGICAL SUPPLY — 53 items
APPLICATOR COTTON TIP 6IN STRL (MISCELLANEOUS) ×4 IMPLANT
BLADE EXTENDED COATED 6.5IN (ELECTRODE) ×2 IMPLANT
BLADE HEX COATED 2.75 (ELECTRODE) ×2 IMPLANT
BLADE SURG SZ10 CARB STEEL (BLADE) ×2 IMPLANT
CANISTER SUCTION 2500CC (MISCELLANEOUS) ×2 IMPLANT
CLIP TI LARGE 6 (CLIP) IMPLANT
CLOTH BEACON ORANGE TIMEOUT ST (SAFETY) ×2 IMPLANT
COVER MAYO STAND STRL (DRAPES) ×2 IMPLANT
DRAPE LAPAROSCOPIC ABDOMINAL (DRAPES) ×2 IMPLANT
DRAPE LG THREE QUARTER DISP (DRAPES) IMPLANT
DRAPE WARM FLUID 44X44 (DRAPE) ×2 IMPLANT
DRSG PAD ABDOMINAL 8X10 ST (GAUZE/BANDAGES/DRESSINGS) IMPLANT
ELECT REM PT RETURN 9FT ADLT (ELECTROSURGICAL) ×2
ELECTRODE REM PT RTRN 9FT ADLT (ELECTROSURGICAL) ×1 IMPLANT
GLOVE BIOGEL PI IND STRL 7.0 (GLOVE) ×1 IMPLANT
GLOVE BIOGEL PI IND STRL 8 (GLOVE) ×2 IMPLANT
GLOVE BIOGEL PI INDICATOR 7.0 (GLOVE) ×1
GLOVE BIOGEL PI INDICATOR 8 (GLOVE) ×2
GLOVE ECLIPSE 8.0 STRL XLNG CF (GLOVE) ×6 IMPLANT
GLOVE INDICATOR 8.0 STRL GRN (GLOVE) ×2 IMPLANT
GOWN STRL NON-REIN LRG LVL3 (GOWN DISPOSABLE) ×2 IMPLANT
GOWN STRL REIN XL XLG (GOWN DISPOSABLE) ×4 IMPLANT
HAND ACTIVATED (MISCELLANEOUS) IMPLANT
KIT BASIN OR (CUSTOM PROCEDURE TRAY) ×2 IMPLANT
LEGGING LITHOTOMY PAIR STRL (DRAPES) IMPLANT
NS IRRIG 1000ML POUR BTL (IV SOLUTION) ×4 IMPLANT
PACK GENERAL/GYN (CUSTOM PROCEDURE TRAY) ×2 IMPLANT
SPONGE GAUZE 4X4 12PLY (GAUZE/BANDAGES/DRESSINGS) ×2 IMPLANT
STAPLER GUN LINEAR PROX 60 (STAPLE) ×2 IMPLANT
STAPLER PROXIMATE 75MM BLUE (STAPLE) ×2 IMPLANT
STAPLER VISISTAT 35W (STAPLE) IMPLANT
SUCTION POOLE TIP (SUCTIONS) ×2 IMPLANT
SUT CHROMIC 2 0 SH (SUTURE) IMPLANT
SUT NOV 1 T60/GS (SUTURE) IMPLANT
SUT NOVA 1 T20/GS 25DT (SUTURE) IMPLANT
SUT NOVA NAB DX-16 0-1 5-0 T12 (SUTURE) IMPLANT
SUT NOVA T20/GS 25 (SUTURE) IMPLANT
SUT PDS AB 1 CTX 36 (SUTURE) ×4 IMPLANT
SUT PROLENE 2 0 KS (SUTURE) IMPLANT
SUT SILK 2 0 (SUTURE) ×1
SUT SILK 2 0 SH CR/8 (SUTURE) ×4 IMPLANT
SUT SILK 2 0SH CR/8 30 (SUTURE) IMPLANT
SUT SILK 2-0 18XBRD TIE 12 (SUTURE) ×1 IMPLANT
SUT SILK 2-0 30XBRD TIE 12 (SUTURE) IMPLANT
SUT SILK 3 0 (SUTURE) ×1
SUT SILK 3 0 SH CR/8 (SUTURE) ×4 IMPLANT
SUT SILK 3-0 18XBRD TIE 12 (SUTURE) ×1 IMPLANT
SUT VIC AB 3-0 SH 18 (SUTURE) ×2 IMPLANT
SUT VIC AB 4-0 PS2 18 (SUTURE) ×2 IMPLANT
TOWEL OR 17X26 10 PK STRL BLUE (TOWEL DISPOSABLE) ×4 IMPLANT
TRAY FOLEY CATH 14FRSI W/METER (CATHETERS) ×2 IMPLANT
WATER STERILE IRR 1500ML POUR (IV SOLUTION) IMPLANT
YANKAUER SUCT BULB TIP NO VENT (SUCTIONS) ×2 IMPLANT

## 2012-09-18 NOTE — Anesthesia Preprocedure Evaluation (Signed)
Anesthesia Evaluation  Patient identified by MRN, date of birth, ID band Patient awake    Reviewed: Allergy & Precautions, H&P , NPO status , Patient's Chart, lab work & pertinent test results, reviewed documented beta blocker date and time   Airway Mallampati: II TM Distance: >3 FB Neck ROM: full    Dental   Pulmonary neg pulmonary ROS,  breath sounds clear to auscultation        Cardiovascular hypertension, On Medications + Valvular Problems/Murmurs Rhythm:regular     Neuro/Psych negative neurological ROS  negative psych ROS   GI/Hepatic negative GI ROS, Neg liver ROS,   Endo/Other  diabetes, Oral Hypoglycemic AgentsHypothyroidism   Renal/GU negative Renal ROS  negative genitourinary   Musculoskeletal   Abdominal   Peds  Hematology negative hematology ROS (+)   Anesthesia Other Findings See surgeon's H&P   Reproductive/Obstetrics negative OB ROS                           Anesthesia Physical Anesthesia Plan  ASA: III  Anesthesia Plan: General   Post-op Pain Management:    Induction: Intravenous  Airway Management Planned: Oral ETT  Additional Equipment:   Intra-op Plan:   Post-operative Plan: Extubation in OR  Informed Consent: I have reviewed the patients History and Physical, chart, labs and discussed the procedure including the risks, benefits and alternatives for the proposed anesthesia with the patient or authorized representative who has indicated his/her understanding and acceptance.   Dental Advisory Given  Plan Discussed with: CRNA and Surgeon  Anesthesia Plan Comments:         Anesthesia Quick Evaluation

## 2012-09-18 NOTE — Transfer of Care (Signed)
Immediate Anesthesia Transfer of Care Note  Patient: Cory Harper  Procedure(s) Performed: Procedure(s) with comments: Loop Ileostomy Takedown with EUA  (N/A) - loop ileostomy takedown with EUA   Patient Location: PACU  Anesthesia Type:General  Level of Consciousness: awake, alert , oriented and patient cooperative  Airway & Oxygen Therapy: Patient Spontanous Breathing and Patient connected to face mask oxygen  Post-op Assessment: Report given to PACU RN, Post -op Vital signs reviewed and stable and Patient moving all extremities X 4  Post vital signs: Reviewed and stable  Complications: No apparent anesthesia complications

## 2012-09-18 NOTE — Anesthesia Postprocedure Evaluation (Signed)
Anesthesia Post Note  Patient: Cory Harper  Procedure(s) Performed: Procedure(s) (LRB): Loop Ileostomy Takedown with EUA  (N/A)  Anesthesia type: General  Patient location: PACU  Post pain: Pain level controlled  Post assessment: Patient's Cardiovascular Status Stable  Last Vitals:  Filed Vitals:   09/18/12 0945  BP: 158/76  Pulse: 77  Temp:   Resp: 11    Post vital signs: Reviewed and stable  Level of consciousness: alert  Complications: No apparent anesthesia complications

## 2012-09-18 NOTE — Op Note (Signed)
09/18/2012  9:15 AM  PATIENT:  Cory Harper  57 y.o. 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  INDICATION: Pleasant patient status post proctocolectomy / IPAA J pouch with diverting loop ileostomy to protect the very distal anastomosis.  The patient has recovered from that surgery with the anastomosis well-healed.  Sigmoidoscopy done to confirm J-pouch in good position without any abnormal connections.  It was felt safe to have the loop ileostomy taken down.  I discussed the procedure with the patient:  The anatomy & physiology of the digestive tract was discussed.  The pathophysiology was discussed.  Possibility of remaining with an ostomy permanently was discussed.  I offered ostomy takedown.  Laparoscopic & open techniques were discussed.   Risks such as bleeding, infection, abscess, leak, reoperation, possible re-ostomy, injury to other organs, hernia, heart attack, death, and other risks were discussed.   I noted a good likelihood this will help address the problem.  Goals of post-operative recovery were discussed as well.  We  will work to minimize complications.  Questions were answered.  The patient expresses understanding & wishes to proceed with surgery.  OR FINDINGS:   Normal anatomy.  Anastomosis with mild stricturing 2 cm from anal verge.  Normal sphincter tone  DESCRIPTION:   Informed consent was confirmed.   The patient received IV antibiotics & underwent general anesthesia without any difficulty.  Foley catheter was sterilely placed. SCDs were active during the entire case.  I examined the perineum.  The ileoanal IPAA J pouch anastomosis was well-healed.  No evidence of abscess or fistula.  Sphincter tone intact.  Neorectum soft and normal.  I detected no need for delay of loop ileostomy takedown.  The abdomen was prepped and draped in a sterile fashion.  A surgical timeout confirmed our plan.  I made a biconcave curvilinear incision transversely around the loop ileostomy.  I got into the subcutaneous tissues.  I used careful focused right angle dissection and sharp dissection.  Some focused cautery dissection as well.  That helped to free adhesions to the subcutaneous tisses & fascia.  I was able to enter into the peritoneum focally.  I did a gentle finger sweep.  Gradually came around circumferentially and freed the loop of ileum from remaining adhesions to the abdominal wall.  We were able eviscerate some bowel proximally and distally.    I did a side-to-side stapled anastomosis using a 75 GIA.  I used a TA 60 to staple off the common defect.  I resected the loop ileostomy and the intra-abdominal component of the loop that had moderate adhesions.  We ligated the mesentery with clamps and silk ties.  I closed the mesenteric defect using interrupted silk stitches.  The anastomosis looked healthy and viable.  Silk stitch placed at the crotch of the anastomosis.  We returned the anastomosis into the abdominal cavity.  Finger sweep circumferentially noted no adhesions.  It rested well.    We irrigated with antibiotic  solution (clindamycin/gentamicin).  Let it rest for five minutes.  We changed gloves and instruments.  Irrigated copiously into the wound and fascia.  I reapproximated the fascia transversely using #1 PDS stitches.  I irrigated with the antibiotic solution into the subcutaneous tissues.  There is some dogear dimpling at the corners.  I resected some excess and skin and extended the incision a little bit laterally and medially until it rested better.  I had some 3-0 Vicryl deep dermal interrupted stitches to bring the wound together.  I reapproximated the skin using 4-0 Monocryl stitch running centrally.  I left the corners open.  I packed the corners with Chlorhexidine soaked umbilical tape for wicks.  Sterile dressing was applied.  The patient is being extubated to go to recovery room.  I discussed postop care with the patient in the office.  I discussed it in the holding area.  Instructions are written. I am about to discuss the patient's status to the family.  Questions were answered.  They expressed understanding & appreciation.

## 2012-09-18 NOTE — H&P (Signed)
Cory Harper  [birth year] [id number]   This patient is a 57 y.o.male who presents today for surgical evaluation   Pleasant morbidly obese male with familial adenomatous polyposis.  Status post abdominal proctocolectomy with ileoanal J-pouch and protective loop ileostomy.  Also early colon cancer.  Recovering well.  Wishes ileostomy takedown.  Been off all antidiarrheals for two weeks.  No fevers or chills.  No dehydration.  No known meds.  Not needed post adjuvant chemotherapy since it was T3 N0 early stage II cancer with good prognostic features.    Past Medical History  Diagnosis Date  . Diabetes mellitus   . Colon polyps     he had declined f/u colonoscopy after inital colonoscopy  . Nasal congestion   . Colonic mass   . Hyperlipidemia   . Heart murmur   . Cancer     stg II colon cancer s/p resection  . Arthritis   . Hypertension     not on blood pressure meds since 11/13   . Hypothyroidism     Past Surgical History  Procedure Laterality Date  . Knee arthroscopy  1998    Right knee  . Upper gastrointestinal endoscopy    . Wisdom tooth extraction    . Colon resection  05/20/2012    Procedure: COLON RESECTION LAPAROSCOPIC;  Surgeon: Ardeth Sportsman, MD;  Location: [facility];  Service: General;  Laterality: N/A;  Laparoscopic Proctocolectomy, Ileal Pouch Anal Anastomoisis, Loop Ileostomy  . Bowel resection  05/20/2012  . Thyroid lobectomy  05/20/2012    Procedure: THYROID LOBECTOMY;  Surgeon: Ardeth Sportsman, MD;  Location: [facility];  Service: General;  Laterality: Left;  LEFT THYROID LOBECTOMY  . Ileostomy  05/20/12  . Flexible sigmoidoscopy  08/01/2012    Procedure: FLEXIBLE SIGMOIDOSCOPY;  Surgeon: [name], MD;  Location: [facility];  Service: Endoscopy;  Laterality: N/A;  use endoscope    History   Social History  . Marital Status: Married    Spouse Name: June    Number of Children: 0  . Years of Education: N/A   Occupational History  . 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 Smoking status: Former Smoker    Quit date: 08/07/1991  . Smokeless tobacco: Never Used     Comment: quit in 1993  . Alcohol Use: No  . Drug Use: No  . Sexually Active: Not on file   Other Topics Concern  . 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 Uterine cancer Mother   . Hypertension Mother   . Stroke Mother   . Colon cancer Mother   . Colon polyps Mother   . Cancer Mother     colon, endometrial  . Diabetes Father   . Obesity Father   . Pneumonia Father   . Cancer Father     skin - squamous   . Diabetes Paternal Grandmother   . Prostate cancer Neg Hx     Current Facility-Administered Medications  Medication Dose Route Frequency Provider Last Rate Last Dose  . vancomycin (VANCOCIN) 1,500 mg in sodium chloride 0.9 % 500 mL IVPB  1,500 mg Intravenous On Call to OR Viviann Spare  C. Vasco Chong, MD       Facility-Administered Medications Ordered in Other Encounters  Medication Dose Route Frequency Provider Last Rate Last Dose  . acetaminophen (OFIRMEV) IV    PRN Elyn Peers, CRNA   1,000 mg at 09/18/12 1610  . lactated ringers infusion    Continuous PRN Elyn Peers, CRNA         Allergies  Allergen Reactions  . Ibuprofen     Joint swelling    ROS: Constitutional:  No fevers, chills, sweats.  Weight stable Eyes:  No vision changes, No discharge HENT:  No sore throats, nasal drainage Lymph: No neck swelling, No bruising easily Pulmonary:  No cough, productive sputum CV: No orthopnea, PND  Patient walks 30 minutes for about 1 miles without difficulty.  No exertional chest/neck/shoulder/arm pain. GI:  No personal nor family history inflammatory bowel disease, irritable bowel syndrome, allergy such as  Celiac Sprue, dietary/dairy problems, colitis, ulcers nor gastritis.  No recent sick contacts/gastroenteritis.  No travel outside the country.  No changes in diet. Renal: No UTIs, No hematuria Genital:  No drainage, bleeding, masses Musculoskeletal: No severe joint pain.  Good ROM major joints Skin:  No sores or lesions.  No rashes Heme/Lymph:  No easy bleeding.  No swollen lymph nodes Neuro: No focal weakness/numbness.  No seizures Psych: No suicidal ideation.  No hallucinations  BP 142/76  Pulse 81  Temp(Src) 97.6 F (36.4 C) (Oral)  Resp 20  SpO2 97%  Physical Exam: General: Pt awake/alert/oriented x4 in no major acute distress Eyes: PERRL, normal EOM. Sclera nonicteric Neuro: CN II-XII intact w/o focal sensory/motor deficits. Lymph: No head/neck/groin lymphadenopathy Psych:  No delerium/psychosis/paranoia HENT: Normocephalic, Mucus membranes moist.  No thrush Neck: Supple, No tracheal deviation Chest: No pain.  Good respiratory excursion. CV:  Pulses intact.  Regular rhythm Abdomen: Soft, Nondistended.  Ileostomy in place.   Nontender.  No incarcerated hernias. Ext:  SCDs BLE.  No significant edema.  No cyanosis Skin: No petechiae / purpurea.  No major sores Musculoskeletal: No severe joint pain.  Good ROM major joints   Results:   Labs: Results for orders placed during the hospital encounter of 09/18/12 (from the past 48 hour(s))  TYPE AND SCREEN     Status: None   Collection Time    09/18/12  6:10 AM      Result Value Range   ABO/RH(D) O POS     Antibody Screen PENDING     Sample Expiration 09/21/2012    GLUCOSE, CAPILLARY     Status: None   Collection Time    09/18/12  6:14 AM      Result Value Range   Glucose-Capillary 99  70 - 99 mg/dL   Comment 1 Documented in Chart      Imaging / Studies: No results found.  Medications / Allergies: per chart  Antibiotics: Anti-infectives   Start     Dose/Rate Route Frequency Ordered Stop   09/18/12 0540   vancomycin (VANCOCIN) 1,500 mg in sodium chloride 0.9 % 500 mL IVPB     1,500 mg 250 mL/hr over 120 Minutes Intravenous On call to O.R. 09/18/12 0540 09/19/12 0559      Assessment  Cory Harper  57 y.o. male  Day of Surgery  Procedure(s): Loop Ileostomy Takedown with EUA   Problem List:  Active Problems:   * No active hospital problems. *   Status post protective loop ileostomy for ileoanal J-pouch periods and stage II colon cancer  coronal.  Status post proctoscopy showing good functional pouch.  Ready for ileostomy takedown  Plan:  The anatomy & physiology of the digestive tract was discussed.  The pathophysiology was discussed.  Possibility of remaining with an ostomy permanently was discussed.  I offered ostomy takedown.  Laparoscopic & open techniques were discussed.   Risks such as bleeding, infection, abscess, leak, reoperation, possible re-ostomy, injury to other organs, hernia, heart attack, death, and other risks were discussed.   I noted a good likelihood this will help address the problem.  Goals of post-operative recovery were discussed as well.  We will work to minimize complications.  Questions were answered.  The patient expresses understanding & wishes to proceed with surgery.    -VTE prophylaxis- SCDs, etc -mobilize as tolerated to help recovery    Ardeth Sportsman, M.D., F.A.C.S. Gastrointestinal and Minimally Invasive Surgery [facility] [address], [address] [place], [place] [address] [phone] Main / Paging [phone] Voice Mail   09/18/2012  CARE TEAM:  PCP: Crawford Givens, MD  Outpatient Care Team: Patient Care Team: [name], MD as PCP - General (Family Medicine) Rachael Fee, MD as Consulting Physician (Gastroenterology) [name], MD as Consulting Physician (General Surgery)  Inpatient Treatment Team: Treatment Team: Attending Provider: Ardeth Sportsman, MD

## 2012-09-19 ENCOUNTER — Encounter (HOSPITAL_COMMUNITY): Payer: Self-pay | Admitting: Surgery

## 2012-09-19 LAB — BASIC METABOLIC PANEL
CO2: 27 mEq/L (ref 19–32)
Chloride: 104 mEq/L (ref 96–112)
GFR calc Af Amer: 77 mL/min — ABNORMAL LOW (ref >90)
Potassium: 4.6 mEq/L (ref 3.5–5.1)
Sodium: 136 mEq/L (ref 135–145)

## 2012-09-19 LAB — CBC
Platelets: 265 10*3/uL (ref 150–400)
RBC: 3.35 MIL/uL — ABNORMAL LOW (ref 4.22–5.81)
RDW: 14.6 % (ref 11.5–15.5)
WBC: 6.5 10*3/uL (ref 4.0–10.5)

## 2012-09-19 LAB — GLUCOSE, CAPILLARY
Glucose-Capillary: 204 mg/dL — ABNORMAL HIGH (ref 70–99)
Glucose-Capillary: 154 mg/dL — ABNORMAL HIGH (ref 70–99)
Glucose-Capillary: 121 mg/dL — ABNORMAL HIGH (ref 70–99)

## 2012-09-19 LAB — HEMOGLOBIN A1C: Hgb A1c MFr Bld: 6.5 % — ABNORMAL HIGH (ref <5.7)

## 2012-09-19 LAB — MAGNESIUM: Magnesium: 1.7 mg/dL (ref 1.5–2.5)

## 2012-09-19 MED ORDER — PNEUMOCOCCAL VAC POLYVALENT 25 MCG/0.5ML IJ INJ
0.5000 mL | INJECTION | INTRAMUSCULAR | Status: AC
  Administered 2012-09-20: 0.5 mL via INTRAMUSCULAR
  Filled 2012-09-19 (×2): qty 0.5

## 2012-09-19 MED ORDER — LACTATED RINGERS IV BOLUS (SEPSIS): 1000.0000 mL | Freq: Three times a day (TID) | INTRAVENOUS | Status: AC | PRN

## 2012-09-19 MED ORDER — ATORVASTATIN CALCIUM 20 MG PO TABS
20.0000 mg | ORAL_TABLET | Freq: Every day | ORAL | Status: DC
  Administered 2012-09-19 – 2012-09-21 (×3): 20 mg via ORAL
  Filled 2012-09-19 (×4): qty 1

## 2012-09-19 MED ORDER — GLIMEPIRIDE 2 MG PO TABS
2.0000 mg | ORAL_TABLET | Freq: Two times a day (BID) | ORAL | Status: DC
  Administered 2012-09-19 – 2012-09-21 (×6): 2 mg via ORAL
  Filled 2012-09-19 (×8): qty 1

## 2012-09-19 MED ORDER — OXYCODONE HCL 5 MG PO TABS
5.0000 mg | ORAL_TABLET | ORAL | Status: DC | PRN
  Administered 2012-09-19 (×3): 5 mg via ORAL
  Filled 2012-09-19 (×3): qty 1

## 2012-09-19 MED ORDER — FERROUS SULFATE 325 (65 FE) MG PO TABS
325.0000 mg | ORAL_TABLET | Freq: Two times a day (BID) | ORAL | Status: DC
  Administered 2012-09-19 – 2012-09-22 (×7): 325 mg via ORAL
  Filled 2012-09-19 (×9): qty 1

## 2012-09-19 NOTE — Care Management Note (Signed)
    Page 1 of 1   09/19/2012     11:40:46 AM   CARE MANAGEMENT NOTE 09/19/2012  Patient:  Cory Harper, Cory Harper   Account Number:  [id number]  Date Initiated:  09/19/2012  Documentation initiated by:  Lorenda Ishihara  Subjective/Objective Assessment:   57 yo male admitted s/p ileostomy takedown. PTA lived at home with spouse.     Action/Plan:   Home when stable   Anticipated DC Date:  09/22/2012   Anticipated DC Plan:  HOME/SELF CARE      DC Planning Services  CM consult      Choice offered to / List presented to:             Status of service:  Completed, signed off Medicare Important Message given?   (If response is "NO", the following Medicare IM given date fields will be blank) Date Medicare IM given:   Date Additional Medicare IM given:    Discharge Disposition:  HOME/SELF CARE  Per UR Regulation:  Reviewed for med. necessity/level of care/duration of stay  If discussed at Long Length of Stay Meetings, dates discussed:    Comments:

## 2012-09-19 NOTE — Progress Notes (Signed)
Cory Harper [id number] March 22, 1956   Subjective:  Feels pretty good Wife in room Sitting in chair.  Walked last night Tol liquids  Objective:  Vital signs:  Filed Vitals:   09/18/12 1730 09/18/12 2112 09/19/12 0145 09/19/12 0536  BP: 118/65 133/63 111/55 124/65  Pulse: 90 91 81 83  Temp: 97.8 F (36.6 C) 97.9 F (36.6 C) 98.3 F (36.8 C) 98.2 F (36.8 C)  TempSrc: Oral Oral Oral Oral  Resp: 18 18 16 18   Height:      Weight:      SpO2: 93% 93% 96% 92%    Last BM Date:  (s/p colostomy take down)  Intake/Output   Yesterday:  02/13 0701 - 02/14 0700 In: 4679.6 [P.O.:240; I.V.:4439.6] Out: 1150 [Urine:1050; Blood:100] This shift:  Total I/O In: -  Out: 250 [Urine:250]  Bowel function:  Flatus: n  BM: n  Physical Exam:  General: Pt awake/alert/oriented x4 in no acute distress Eyes: PERRL, normal EOM.  Sclera clear.  No icterus Neuro: CN II-XII intact w/o focal sensory/motor deficits. Lymph: No head/neck/groin lymphadenopathy Psych:  No delerium/psychosis/paranoia HENT: Normocephalic, Mucus membranes moist.  No thrush Neck: Supple, No tracheal deviation Chest: No chest wall pain w good excursion CV:  Pulses intact.  Regular rhythm MS: Normal AROM mjr joints.  No obvious deformity Abdomen: Soft.  Nondistended.  Mildly tender at R abd incision only.  Old blood.  No incarcerated hernias. Ext:  SCDs BLE.  No mjr edema.  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 male  1 Day Post-Op  Procedure(s): Loop Ileostomy Takedown with EUA    Stable  Plan:  -adv diet slowly if no n/v. -anti-ileus protocol -DM glucose control - oral meds w SSI backup -anemia - iron PO -VTE prophylaxis- SCDs, etc -mobilize as tolerated to help recovery  I discussed the patient's status to the pt & wife.  Questions were answered.  They expressed understanding & appreciation.   Cory Harper, M.D., F.A.C.S. Gastrointestinal and Minimally Invasive Surgery [facility] [address], [address] [place], [place] [address] [phone] Main / Paging [phone] Voice Mail   09/19/2012  CARE TEAM:  PCP: Cory Givens, MD  Outpatient Care Team: Patient Care Team: Cory Nam, MD as PCP - General (Family Medicine) Cory Fee, MD as Consulting Physician (Gastroenterology) Cory Levee, MD as Consulting Physician (General Surgery)  Inpatient Treatment Team: Treatment Team: Attending Provider: Ardeth Sportsman, MD; Registered Nurse: Cory Schroeder, RN; Registered Nurse: Cory Douglas, RN; Registered Nurse: Cory Harper   Results:   Labs: Results for orders placed during the hospital encounter of 09/18/12 (from the past 48 hour(s))  TYPE AND SCREEN     Status: None   Collection Time    09/18/12  6:10 AM      Result Value Range   ABO/RH(D) O POS     Antibody Screen NEG     Sample Expiration 09/21/2012    GLUCOSE, CAPILLARY     Status: None  Collection Time    09/18/12  6:14 AM      Result Value Range   Glucose-Capillary 99  70 - 99 mg/dL   Comment 1 Documented in Chart    GLUCOSE, CAPILLARY     Status: Abnormal   Collection Time    09/18/12  9:30 AM      Result Value Range   Glucose-Capillary 137 (*) 70 - 99 mg/dL  GLUCOSE, CAPILLARY     Status: Abnormal   Collection Time    09/18/12  4:50 PM      Result Value Range   Glucose-Capillary 144 (*) 70 - 99 mg/dL   Comment 1 Notify RN    GLUCOSE, CAPILLARY     Status: Abnormal   Collection Time    09/18/12  9:43 PM       Result Value Range   Glucose-Capillary 162 (*) 70 - 99 mg/dL  CBC     Status: Abnormal   Collection Time    09/19/12  5:12 AM      Result Value Range   WBC 6.5  4.0 - 10.5 K/uL   RBC 3.35 (*) 4.22 - 5.81 MIL/uL   Hemoglobin 9.3 (*) 13.0 - 17.0 g/dL   HCT 96.0 (*) 45.4 - 09.8 %   MCV 89.3  78.0 - 100.0 fL   MCH 27.8  26.0 - 34.0 pg   MCHC 31.1  30.0 - 36.0 g/dL   RDW 11.9  14.7 - 82.9 %   Platelets 265  150 - 400 K/uL  BASIC METABOLIC PANEL     Status: Abnormal   Collection Time    09/19/12  5:12 AM      Result Value Range   Sodium 136  135 - 145 mEq/L   Potassium 4.6  3.5 - 5.1 mEq/L   Chloride 104  96 - 112 mEq/L   CO2 27  19 - 32 mEq/L   Glucose, Bld 163 (*) 70 - 99 mg/dL   BUN 13  6 - 23 mg/dL   Creatinine, Ser 5.62  0.50 - 1.35 mg/dL   Calcium 8.2 (*) 8.4 - 10.5 mg/dL   GFR calc non Af Amer 66 (*) >90 mL/min   GFR calc Af Amer 77 (*) >90 mL/min   Comment:            The eGFR has been calculated     using the CKD EPI equation.     This calculation has not been     validated in all clinical     situations.     eGFR's persistently     <90 mL/min signify     possible Chronic Kidney Disease.  MAGNESIUM     Status: None   Collection Time    09/19/12  5:12 AM      Result Value Range   Magnesium 1.7  1.5 - 2.5 mg/dL    Imaging / Studies: No results found.  Medications / Allergies: per chart  Antibiotics: Anti-infectives   Start     Dose/Rate Route Frequency Ordered Stop   09/18/12 1800  cefoTEtan (CEFOTAN) 2 g in dextrose 5 % 50 mL IVPB     2 g 100 mL/hr over 30 Minutes Intravenous Every 12 hours 09/18/12 1054 09/18/12 1926   09/18/12 0800  clindamycin (CLEOCIN) 900 mg, gentamicin (GARAMYCIN) 240 mg in sodium chloride 0.9 % 1,000 mL for intraperitoneal lavage      Intraperitoneal To Surgery 09/18/12 0749 09/18/12 0848   09/18/12 0540  vancomycin (  VANCOCIN) 1,500 mg in sodium chloride 0.9 % 500 mL IVPB     1,500 mg 250 mL/hr over 120 Minutes Intravenous On call  to O.R. 09/18/12 4098 09/18/12 1191

## 2012-09-20 LAB — GLUCOSE, CAPILLARY
Glucose-Capillary: 164 mg/dL — ABNORMAL HIGH (ref 70–99)
Glucose-Capillary: 146 mg/dL — ABNORMAL HIGH (ref 70–99)

## 2012-09-20 LAB — MAGNESIUM: Magnesium: 1.8 mg/dL (ref 1.5–2.5)

## 2012-09-20 LAB — BASIC METABOLIC PANEL
CO2: 27 mEq/L (ref 19–32)
Calcium: 8.6 mg/dL (ref 8.4–10.5)
Chloride: 105 mEq/L (ref 96–112)
GFR calc Af Amer: 80 mL/min — ABNORMAL LOW (ref >90)
Sodium: 138 mEq/L (ref 135–145)

## 2012-09-20 MED ORDER — LINAGLIPTIN 5 MG PO TABS
5.0000 mg | ORAL_TABLET | Freq: Every day | ORAL | Status: DC
  Administered 2012-09-20 – 2012-09-21 (×2): 5 mg via ORAL
  Filled 2012-09-20 (×3): qty 1

## 2012-09-20 NOTE — Progress Notes (Signed)
Cory Harper [id number] 19-Oct-1955   Subjective:  Doing well Wife in room Sitting in chair.  Walked multiple times.  Had a BM this AM.  Tol Pureed diet  Objective:  Vital signs:  Filed Vitals:   09/19/12 1945 09/19/12 2215 09/20/12 0221 09/20/12 0629  BP: 128/68 124/74  140/78  Pulse: 82 81  87  Temp: 97.8 F (36.6 C) 98.8 F (37.1 C)  98.3 F (36.8 C)  TempSrc: Oral Oral  Oral  Resp: 16 18  18   Height:   5\' 10"  (1.778 m)   Weight:   251 lb 1.6 oz (113.898 kg)   SpO2: 98% 96%  97%    Last BM Date: 09/18/12  Intake/Output   Yesterday:  02/14 0701 - 02/15 0700 In: 2580 [P.O.:1680; I.V.:900] Out: 3050 [Urine:3050] This shift:     Bowel function:  Flatus: n  BM: n  Physical Exam:  General: Pt awake/alert/oriented x4 in no acute distress Eyes: PERRL, normal EOM.  Sclera clear.  No icterus Neuro: CN II-XII intact w/o focal sensory/motor deficits. Lymph: No head/neck/groin lymphadenopathy Psych:  No delerium/psychosis/paranoia HENT: Normocephalic, Mucus membranes moist.  No thrush Neck: Supple, No tracheal deviation Chest: No chest wall pain w good excursion CV:  Pulses intact.  Regular rhythm MS: Normal AROM mjr joints.  No obvious deformity Abdomen: Soft.  Nondistended.  Mildly tender at R abd incision only.  Old blood.  No incarcerated hernias. Ext:  SCDs BLE.  No mjr edema.  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 male  2 Days Post-Op  Procedure(s): Loop Ileostomy Takedown with EUA   Stable  Plan:  Cont pureed diet  today -anti-ileus protocol -DM glucose control - oral meds w SSI backup -anemia - iron PO -VTE prophylaxis- SCDs, etc -mobilize as tolerated to help recovery  Vanita Panda, MD  Colorectal and General Surgery [facility]  CARE TEAM:  PCP: Crawford Givens, MD  Outpatient Care Team: Patient Care Team: [name], MD as PCP - General (Family Medicine) Rachael Fee, MD as Consulting Physician (Gastroenterology) [name], MD as Consulting Physician (General Surgery)  Inpatient Treatment Team: Treatment Team: Attending Provider: Ardeth Sportsman, MD; Registered Nurse: Tristan Schroeder, RN; Registered Nurse: Leonie Douglas, RN; Registered Nurse: Horton Marshall   Results:   Labs: Results for orders placed during the hospital encounter of 09/18/12 (from the past 48 hour(s))  GLUCOSE, CAPILLARY     Status: Abnormal   Collection Time    09/18/12  4:50 PM      Result Value Range   Glucose-Capillary 144 (*) 70 - 99 mg/dL   Comment 1 Notify RN    GLUCOSE, CAPILLARY     Status: Abnormal   Collection Time    09/18/12  9:43 PM      Result Value Range   Glucose-Capillary 162 (*) 70 - 99 mg/dL  CBC     Status: Abnormal   Collection Time    09/19/12  5:12 AM      Result  Value Range   WBC 6.5  4.0 - 10.5 K/uL   RBC 3.35 (*) 4.22 - 5.81 MIL/uL   Hemoglobin 9.3 (*) 13.0 - 17.0 g/dL   HCT 16.1 (*) 09.6 - 04.5 %   MCV 89.3  78.0 - 100.0 fL   MCH 27.8  26.0 - 34.0 pg   MCHC 31.1  30.0 - 36.0 g/dL   RDW 40.9  81.1 - 91.4 %   Platelets 265  150 - 400 K/uL  BASIC METABOLIC PANEL     Status: Abnormal   Collection Time    09/19/12  5:12 AM      Result Value Range   Sodium 136  135 - 145 mEq/L   Potassium 4.6  3.5 - 5.1 mEq/L   Chloride 104  96 - 112 mEq/L   CO2 27  19 - 32 mEq/L   Glucose, Bld 163 (*) 70 - 99 mg/dL   BUN 13  6 - 23 mg/dL   Creatinine, Ser 7.82  0.50 - 1.35 mg/dL   Calcium 8.2 (*) 8.4 - 10.5 mg/dL   GFR calc non Af Amer 66 (*) >90  mL/min   GFR calc Af Amer 77 (*) >90 mL/min   Comment:            The eGFR has been calculated     using the CKD EPI equation.     This calculation has not been     validated in all clinical     situations.     eGFR's persistently     <90 mL/min signify     possible Chronic Kidney Disease.  MAGNESIUM     Status: None   Collection Time    09/19/12  5:12 AM      Result Value Range   Magnesium 1.7  1.5 - 2.5 mg/dL  HEMOGLOBIN N5A     Status: Abnormal   Collection Time    09/19/12  5:12 AM      Result Value Range   Hemoglobin A1C 6.5 (*) <5.7 %   Comment: (NOTE)                                                                               According to the ADA Clinical Practice Recommendations for 2011, when     HbA1c is used as a screening test:      >=6.5%   Diagnostic of Diabetes Mellitus               (if abnormal result is confirmed)     5.7-6.4%   Increased risk of developing Diabetes Mellitus     References:Diagnosis and Classification of Diabetes Mellitus,Diabetes     Care,2011,34(Suppl 1):S62-S69 and Standards of Medical Care in             Diabetes - 2011,Diabetes Care,2011,34 (Suppl 1):S11-S61.   Mean Plasma Glucose 140 (*) <117 mg/dL  GLUCOSE, CAPILLARY     Status: Abnormal   Collection Time    09/19/12  8:24 AM      Result Value Range   Glucose-Capillary 217 (*) 70 - 99 mg/dL  GLUCOSE, CAPILLARY     Status: Abnormal   Collection Time  09/19/12 11:06 AM      Result Value Range   Glucose-Capillary 204 (*) 70 - 99 mg/dL  GLUCOSE, CAPILLARY     Status: Abnormal   Collection Time    09/19/12  4:42 PM      Result Value Range   Glucose-Capillary 154 (*) 70 - 99 mg/dL  GLUCOSE, CAPILLARY     Status: Abnormal   Collection Time    09/19/12 10:35 PM      Result Value Range   Glucose-Capillary 121 (*) 70 - 99 mg/dL  BASIC METABOLIC PANEL     Status: Abnormal   Collection Time    09/20/12  4:30 AM      Result Value Range   Sodium 138  135 - 145 mEq/L   Potassium  3.9  3.5 - 5.1 mEq/L   Chloride 105  96 - 112 mEq/L   CO2 27  19 - 32 mEq/L   Glucose, Bld 123 (*) 70 - 99 mg/dL   BUN 9  6 - 23 mg/dL   Creatinine, Ser 1.61  0.50 - 1.35 mg/dL   Calcium 8.6  8.4 - 09.6 mg/dL   GFR calc non Af Amer 69 (*) >90 mL/min   GFR calc Af Amer 80 (*) >90 mL/min   Comment:            The eGFR has been calculated     using the CKD EPI equation.     This calculation has not been     validated in all clinical     situations.     eGFR's persistently     <90 mL/min signify     possible Chronic Kidney Disease.  MAGNESIUM     Status: None   Collection Time    09/20/12  4:30 AM      Result Value Range   Magnesium 1.8  1.5 - 2.5 mg/dL  GLUCOSE, CAPILLARY     Status: Abnormal   Collection Time    09/20/12  7:42 AM      Result Value Range   Glucose-Capillary 132 (*) 70 - 99 mg/dL    Imaging / Studies: No results found.  Medications / Allergies: per chart  Antibiotics: Anti-infectives   Start     Dose/Rate Route Frequency Ordered Stop   09/18/12 1800  cefoTEtan (CEFOTAN) 2 g in dextrose 5 % 50 mL IVPB     2 g 100 mL/hr over 30 Minutes Intravenous Every 12 hours 09/18/12 1054 09/18/12 1926   09/18/12 0800  clindamycin (CLEOCIN) 900 mg, gentamicin (GARAMYCIN) 240 mg in sodium chloride 0.9 % 1,000 mL for intraperitoneal lavage      Intraperitoneal To Surgery 09/18/12 0749 09/18/12 0848   09/18/12 0540  vancomycin (VANCOCIN) 1,500 mg in sodium chloride 0.9 % 500 mL IVPB     1,500 mg 250 mL/hr over 120 Minutes Intravenous On call to O.R. 09/18/12 0454 09/18/12 0981

## 2012-09-21 LAB — GLUCOSE, CAPILLARY: Glucose-Capillary: 119 mg/dL — ABNORMAL HIGH (ref 70–99)

## 2012-09-21 LAB — BASIC METABOLIC PANEL
BUN: 11 mg/dL (ref 6–23)
CO2: 27 mEq/L (ref 19–32)
Calcium: 8.9 mg/dL (ref 8.4–10.5)
Creatinine, Ser: 1.17 mg/dL (ref 0.50–1.35)
GFR calc non Af Amer: 69 mL/min — ABNORMAL LOW (ref >90)
Glucose, Bld: 185 mg/dL — ABNORMAL HIGH (ref 70–99)
Sodium: 136 mEq/L (ref 135–145)

## 2012-09-21 NOTE — Progress Notes (Signed)
Cory Harper [id number] 05/08/1956   Subjective:  Doing well Wife in room Sitting in chair.  Walked multiple times.  Has had 6 BM's in 24h.  Tol Pureed diet.  Belching a little  Objective:  Vital signs:  Filed Vitals:   09/20/12 1409 09/20/12 2100 09/21/12 0355 09/21/12 0500  BP: 128/87 129/81  135/80  Pulse: 86 85  96  Temp: 98.5 F (36.9 C) 99.6 F (37.6 C)  98.2 F (36.8 C)  TempSrc: Oral Oral  Oral  Resp: 18 16  18   Height:      Weight:   250 lb 10.6 oz (113.7 kg)   SpO2: 96% 95%  96%    Last BM Date: 09/20/12  Intake/Output   Yesterday:  02/15 0701 - 02/16 0700 In: 960 [P.O.:480; I.V.:480] Out: 2000 [Urine:2000] This shift:     Bowel function:  Flatus: y  BM: y  Physical Exam:  General: Pt awake/alert/oriented x4 in no acute distress Eyes: PERRL, normal EOM.  Sclera clear.  No icterus Neuro: CN II-XII intact w/o focal sensory/motor deficits. Lymph: No head/neck/groin lymphadenopathy Psych:  No delerium/psychosis/paranoia HENT: Normocephalic, Mucus membranes moist.  No thrush Neck: Supple, No tracheal deviation Chest: No chest wall pain w good excursion CV:  Pulses intact.  Regular rhythm MS: Normal AROM mjr joints.  No obvious deformity Abdomen: Soft.  Nondistended.  Mildly tender at R abd incision only.  Old blood.  No incarcerated hernias. Ext:  SCDs BLE.  No mjr edema.  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 male  3 Days Post-Op  Procedure(s): Loop Ileostomy Takedown with EUA   Stable  Plan:  Low fiber diet today,  may be ready for d/c tomorrow if BM's continue at present rate.  Discussed indications for imodium at home, as well as anal care at home.  Pt to call office if develops fevers or severe pelvic pain.   -DM glucose control - oral meds w SSI backup -anemia - iron PO -VTE prophylaxis- SCDs, etc -mobilize as tolerated to help recovery  Vanita Panda, MD  Colorectal and General Surgery [facility]  CARE TEAM:  PCP: Crawford Givens, MD  Outpatient Care Team: Patient Care Team: [name], MD as PCP - General (Family Medicine) Rachael Fee, MD as Consulting Physician (Gastroenterology) [name], MD as Consulting Physician (General Surgery)  Inpatient Treatment Team: Treatment Team: Attending Provider: Ardeth Sportsman, MD; Registered Nurse: Tristan Schroeder, RN; Registered Nurse: Leonie Douglas, RN; Registered Nurse: Horton Marshall   Results:   Labs: Results for orders placed during the hospital encounter of 09/18/12 (from the past 48 hour(s))  GLUCOSE, CAPILLARY     Status: Abnormal   Collection Time    09/19/12  4:42 PM      Result Value Range   Glucose-Capillary 154 (*) 70 - 99 mg/dL  GLUCOSE, CAPILLARY     Status: Abnormal   Collection Time    09/19/12 10:35 PM      Result Value Range   Glucose-Capillary  121 (*) 70 - 99 mg/dL  BASIC METABOLIC PANEL     Status: Abnormal   Collection Time    09/20/12  4:30 AM      Result Value Range   Sodium 138  135 - 145 mEq/L   Potassium 3.9  3.5 - 5.1 mEq/L   Chloride 105  96 - 112 mEq/L   CO2 27  19 - 32 mEq/L   Glucose, Bld 123 (*) 70 - 99 mg/dL   BUN 9  6 - 23 mg/dL   Creatinine, Ser 1.61  0.50 - 1.35 mg/dL   Calcium 8.6  8.4 - 09.6 mg/dL   GFR calc non Af Amer 69 (*) >90 mL/min   GFR calc Af Amer 80 (*) >90 mL/min   Comment:            The eGFR has been calculated     using the CKD EPI equation.     This calculation has not been     validated in all clinical     situations.     eGFR's  persistently     <90 mL/min signify     possible Chronic Kidney Disease.  MAGNESIUM     Status: None   Collection Time    09/20/12  4:30 AM      Result Value Range   Magnesium 1.8  1.5 - 2.5 mg/dL  GLUCOSE, CAPILLARY     Status: Abnormal   Collection Time    09/20/12  7:42 AM      Result Value Range   Glucose-Capillary 132 (*) 70 - 99 mg/dL  GLUCOSE, CAPILLARY     Status: Abnormal   Collection Time    09/20/12 11:50 AM      Result Value Range   Glucose-Capillary 164 (*) 70 - 99 mg/dL  GLUCOSE, CAPILLARY     Status: Abnormal   Collection Time    09/20/12  4:24 PM      Result Value Range   Glucose-Capillary 146 (*) 70 - 99 mg/dL  GLUCOSE, CAPILLARY     Status: Abnormal   Collection Time    09/20/12  9:48 PM      Result Value Range   Glucose-Capillary 179 (*) 70 - 99 mg/dL  BASIC METABOLIC PANEL     Status: Abnormal   Collection Time    09/21/12  4:54 AM      Result Value Range   Sodium 136  135 - 145 mEq/L   Potassium 4.2  3.5 - 5.1 mEq/L   Chloride 101  96 - 112 mEq/L   CO2 27  19 - 32 mEq/L   Glucose, Bld 185 (*) 70 - 99 mg/dL   BUN 11  6 - 23 mg/dL   Creatinine, Ser 0.45  0.50 - 1.35 mg/dL   Calcium 8.9  8.4 - 40.9 mg/dL   GFR calc non Af Amer 69 (*) >90 mL/min   GFR calc Af Amer 79 (*) >90 mL/min   Comment:            The eGFR has been calculated     using the CKD EPI equation.     This calculation has not been     validated in all clinical     situations.     eGFR's persistently     <90 mL/min signify     possible Chronic Kidney Disease.  MAGNESIUM     Status: None   Collection Time    09/21/12  4:54 AM  Result Value Range   Magnesium 1.9  1.5 - 2.5 mg/dL    Imaging / Studies: No results found.  Medications / Allergies: per chart  Antibiotics: Anti-infectives   Start     Dose/Rate Route Frequency Ordered Stop   09/18/12 1800  cefoTEtan (CEFOTAN) 2 g in dextrose 5 % 50 mL IVPB     2 g 100 mL/hr over 30 Minutes Intravenous Every 12 hours  09/18/12 1054 09/18/12 1926   09/18/12 0800  clindamycin (CLEOCIN) 900 mg, gentamicin (GARAMYCIN) 240 mg in sodium chloride 0.9 % 1,000 mL for intraperitoneal lavage      Intraperitoneal To Surgery 09/18/12 0749 09/18/12 0848   09/18/12 0540  vancomycin (VANCOCIN) 1,500 mg in sodium chloride 0.9 % 500 mL IVPB     1,500 mg 250 mL/hr over 120 Minutes Intravenous On call to O.R. 09/18/12 0347 09/18/12 4259

## 2012-09-22 LAB — BASIC METABOLIC PANEL
BUN: 10 mg/dL (ref 6–23)
CO2: 27 mEq/L (ref 19–32)
Chloride: 104 mEq/L (ref 96–112)
Creatinine, Ser: 1.18 mg/dL (ref 0.50–1.35)
GFR calc Af Amer: 79 mL/min — ABNORMAL LOW (ref >90)
Glucose, Bld: 92 mg/dL (ref 70–99)
Potassium: 3.8 mEq/L (ref 3.5–5.1)

## 2012-09-22 LAB — GLUCOSE, CAPILLARY

## 2012-09-22 MED ORDER — FERROUS SULFATE 325 (65 FE) MG PO TABS: 325.0000 mg | ORAL_TABLET | Freq: Two times a day (BID) | ORAL | Status: DC

## 2012-09-22 NOTE — Discharge Summary (Signed)
Physician Discharge Summary  Patient ID: Cory Harper MRN: [id number] DOB/AGE: July 12, 1956 57 y.o.  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  Postoperatively, the patient was placed on an anti-ileus protocol.  The patient mobilized and advanced to a solid diet gradually.  Pain was well-controlled and transitioned off IV medications.    By the time of discharge, the patient was walking well the hallways, eating food well, having flatus.  Pain minimal &  was-controlled on an oral regimen.  Based on meeting DC criteria and recovering well, I felt it was safe for the patient to be discharged home with close followup.  Instructions were discussed in detail.  They are written as well.    Consults: None  Significant Diagnostic Studies:   Treatments: surgery: Ileostomy takedown  Discharge Exam: Blood pressure 121/78, pulse 75, temperature 97.8 F (36.6 C), temperature source Oral, resp. rate 18, height 5\' 10"  (1.778 m), weight 248 lb 7.3 oz (112.7 kg), SpO2 98.00%.  General: Pt awake/alert/oriented x4 in no major acute distress Eyes: PERRL, normal EOM.  Sclera nonicteric Neuro: CN II-XII intact w/o focal sensory/motor deficits. Lymph: No head/neck/groin lymphadenopathy Psych:  No delerium/psychosis/paranoia HENT: Normocephalic, Mucus membranes moist.  No thrush Neck: Supple, No tracheal deviation Chest: No pain.  Good respiratory excursion. CV:  Pulses intact.  Regular rhythm MS: Normal AROM mjr joints.  No obvious deformity Abdomen: Soft, Nondistended.  Min tender at old ileostomy site in abdomen.  Wicks removed.  Scant old blood drainage.  No incarcerated hernias. Ext:  SCDs BLE.  No significant edema.  No cyanosis Skin: No petechiae / purpurae   Disposition: 01-Home or Self Care   Future Appointments Provider Department Dept Phone   12/16/2012 10:00 AM Krista Blue [facility] MEDICAL ONCOLOGY [phone]   12/16/2012 10:30 AM Ladene Artist, MD [facility] MEDICAL ONCOLOGY [phone]       Medication List    TAKE these medications       ACCU-CHEK AVIVA PLUS test strip  Generic drug:  glucose blood     acetaminophen 500 MG tablet  Commonly known as:  TYLENOL  Take 1,000 mg by mouth every 6 (six) hours as needed. For pain     azithromycin 250 MG tablet  Commonly known as:  ZITHROMAX  2 tabs for 1 day than 1 a day for 4 days.     cholecalciferol 1000 UNITS tablet  Commonly known as:  VITAMIN D  Take 1,000 Units by mouth 2 (two) times daily at 10 AM and 5 PM.     fluticasone 50 MCG/ACT nasal spray  Commonly known as:  FLONASE  Place 2 sprays into the nose  daily.     glimepiride 2 MG tablet  Commonly known as:  AMARYL  Take 2 mg by mouth 2 (two) times daily. If blood glucose is less than 70 patient does not take at bedtime     levothyroxine 100 MCG tablet  Commonly known as:  SYNTHROID, LEVOTHROID  Take 100 mcg by mouth every morning.     loperamide 2 MG tablet  Commonly known as:  IMODIUM A-D  Take 1-2 tablets (2-4 mg total) by mouth 4 (four) times daily as needed for diarrhea  or loose stools (Goal is 4-6 BMs / day). For diarrhea     loratadine 10 MG tablet  Commonly known as:  CLARITIN  Take 10 mg by mouth daily as needed.     metFORMIN 1000 MG tablet  Commonly known as:  GLUCOPHAGE  Take 1 tablet (1,000 mg total) by mouth 2 (two) times daily with a meal.     oxyCODONE 5 MG immediate release tablet  Commonly known as:  Oxy IR/ROXICODONE  Take 1-2 tablets (5-10 mg total) by mouth every 4 (four) hours as needed for pain.     rosuvastatin 10 MG tablet  Commonly known as:  CRESTOR  Take 1 tablet (10 mg total) by mouth at bedtime.     saxagliptin HCl 5 MG Tabs tablet  Commonly known as:  ONGLYZA  Take 1 tablet (5 mg total) by mouth daily with supper.     vitamin B-12 1000 MCG tablet  Commonly known as:  CYANOCOBALAMIN  Take 500 mcg by mouth daily.           Follow-up Information   Follow up In 2 weeks.      Signed: Al Gagen C. 09/22/2012, 5:59 AM

## 2012-09-22 NOTE — Progress Notes (Signed)
Patient states understanding of discharge instructions. Rx for oxycodone given. Wicks removed by dr. Michaell Cowing and gauze drsg applied. Patient states he had influenza vaccine in November.

## 2012-10-08 ENCOUNTER — Ambulatory Visit (INDEPENDENT_AMBULATORY_CARE_PROVIDER_SITE_OTHER): Payer: BC Managed Care – PPO | Admitting: Surgery

## 2012-10-08 ENCOUNTER — Encounter (INDEPENDENT_AMBULATORY_CARE_PROVIDER_SITE_OTHER): Payer: Self-pay | Admitting: Surgery

## 2012-10-08 ENCOUNTER — Encounter (INDEPENDENT_AMBULATORY_CARE_PROVIDER_SITE_OTHER): Payer: Self-pay

## 2012-10-08 VITALS — BP 150/70 | HR 70 | Temp 97.6°F | Resp 18 | Ht 71.0 in | Wt 241.4 lb

## 2012-10-08 DIAGNOSIS — K621 Rectal polyp: Secondary | ICD-10-CM

## 2012-10-08 NOTE — Patient Instructions (Signed)
We will call you about followup on your J-pouch.  If you have not heard from [place] by next week, please call us [phone]  Keep your appointment with gastroenterology.  Upper gastrointestinal polyp screening per Dr. Christella Hartigan.  Ileal Pouch-Anal Anastomosis (IPAA) Care After Refer to this sheet in the next few weeks. These discharge instructions provide you with general information on caring for yourself after you leave the hospital. Your caregiver may also give you specific instructions. Your treatment has been planned according to the most current medical practices available, but unavoidable complications sometimes occur. If you have any problems or questions after discharge, please call your caregiver. The ileal pouch-anal anastomosis (IPAA) procedure creates a reservoir for stool after the colon and rectum have been removed (colectomy) due to disease. The operation is a 1 to 3 stage procedure. In the initial stage(s) a J-pouch is created using a portion of the small intestine (ileum) in a "J" shape and is connected to the anus (end of the rectum). A temporary outside belly (abdominal) opening for removing waste (ileostomy) may be created. In the final stage, the ileostomy is reversed. After this, waste can move from the small intestine, through the new J-pouch, and out through the anus. Recovery from the final stage of the surgery and adapting to the new J-pouch can take several months or up to 1 year. HOME CARE INSTRUCTIONS  Everyone recovers at a different pace. Proper self-care will help you recover fully and return to your normal activities. Frequent stools are normal upon discharge following both stages of the procedure. This will taper off over time to about 4 to 6 movements per day. This happens as the pouch stretches out and starts to hold more stool. Follow your caregiver's specific instructions as well as these steps. After the initial stage of surgery:  Take it easy. Rest often. Give yourself  time to adjust to having the temporary opening (stoma) in the abdomen. Give your incisions and new pouch time to heal.  Follow your caregiver's instructions for monitoring your stoma, proper skin care, odor control, and changing the stoma device.  Follow your caregiver's dietary instructions. Your caregiver will help you to understand what foods may cause blockage, increase bowel movements, slow bowel movements, and cause skin irritation or gas. Remember these tips:  Chew your foods thoroughly.  Gradually introduce new foods one at a time.  Avoid drinking lots of fluids with your meals.  Drink plenty of fluids between meals. Drink enough water and fluids to keep your urine clear or pale yellow.  Do not eat high-fiber foods on an empty stomach.  Eat at regular intervals. Do not skip meals to slow your output.  Take pain or other medicines as directed.  Record your diet and bowel movements in a chart as directed.  Continue with Kegel exercises as directed.  Follow up with your caregiver as directed. After the final stage of surgery:  Take it easy. Rest often to give your cut (incision) and repairs time to heal.  Take pain or other medicines as directed.  Follow a low-fiber diet for 2 to 3 weeks, or as directed. Then, gradually return to a normal diet.  Eat meals on a regular schedule and chew thoroughly.  Avoid spicy and gassy foods in the beginning.  Regular amounts of sodium are okay.  Increase your potassium through foods such as meat, fish, bananas, and sweet potatoes to prevent or treat diarrhea.  Avoid an excess of foods high in sugar.  Take bulking agents or antidiarrheal medicines as directed. These will help to firm up and reduce your bowel movements. You will learn how to control your bowel movements over time with medicine and diet.  Drink enough water and fluids to keep your urine clear or pale yellow.  Some nighttime leakage is normal at first. This will  improve over time. Wear a pad at night until this problem goes away.  Continue proper skin care around the anus.  Use only soft cotton balls with warm water to wipe the anal area.  Change pads or panty liners frequently.  Wear cotton undergarments.  Follow your caregiver's instructions for skin barriers and soothing baths sitting in warm water (sitz).  Avoid spicy foods, coffee, and tea if you experience irritation.  Record your bowel movements in a chart as directed.  Continue with Kegel exercises as directed.  Follow up with your caregiver as directed. Other things to keep in mind (if they apply):  You may gradually resume most activities, including sexual activity, as directed. Ask your caregiver about birth control, as medicine may not be absorbed normally.  Menstrual cycles are commonly disrupted after surgery. You may be off schedule for up to 1 year.  Always discuss your medicines with your caregiver before taking them.  Do not take laxatives.  Wear a special medical alert bracelet to alert caregivers that you have had colectomy and pouch procedures. SEEK MEDICAL CARE IF:   You are having trouble caring for your stoma or using the ostomy supplies (changing the pouch).  You have an oral temperature above 102 F (38.9 C).  You develop chills.  You are feeling sick to your stomach (nauseous).  You are throwing up (vomiting).  You notice bleeding, skin irritation, drainage, redness, or pain around the anus or stoma.  You notice a change in the size or appearance of the stoma.  You have abdominal pain, bloating, pressure, or cramping.  Your stools do not become firmer.  Your stool frequency is not as expected by you and your caregiver (too high or too low).  You have frequent diarrhea.  You experience sexual dysfunction.  You experience shortness of breath, fatigue, thirst, dry mouth, or unusual sensations in the limbs. This is usually related to hydration  or diet.  You have other new symptoms.  You have questions or concerns. SEEK IMMEDIATE MEDICAL CARE IF:   Abdominal pain does not go away or becomes severe.  You have an oral temperature above 102 F (38.9 C), not controlled by medicine.  Repeated vomiting occurs.  Stool is not draining through the stoma or being removed through the anus.  You have an irregular heartbeat or chest pain. MAKE SURE YOU:   Understand these instructions.  Will watch your condition.  Will get help right away if you are not doing well or get worse. FOR MORE INFORMATION: [employer] J[employer]ouch [employer]: www.j-pouch.org. [employer] (UO[employer]): www.ostomy.org. Document Released: 10/17/2009 Document Revised: 10/15/2011 Document Reviewed: 10/17/2009 [facility] Patient Information 2013 [place], [place].   Pelvic floor muscle training exercises  can help strengthen the muscles under the uterus, bladder, and bowel (large intestine). They can help both men and women who have problems with urine leakage or bowel control.  A pelvic floor muscle training exercise is like pretending that you have to urinate, and then holding it. You relax and tighten the muscles that control urine flow. It's important to find the right muscles to tighten.  The next time you have  to urinate, start to go and then stop. Feel the muscles in your vagina, bladder, or anus get tight and move up. These are the pelvic floor muscles. If you feel them tighten, you've done the exercise right. If you are still not sure whether you are tightening the right muscles, keep in mind that all of the muscles of the pelvic floor relax and contract at the same time. Because these muscles control the bladder, rectum, and vagina, the following tips may help: Women: Insert a finger into your vagina. Tighten the muscles as if you are holding in your urine, then let go. You should feel the muscles tighten and move up and down.  Men: Insert a finger  into your rectum. Tighten the muscles as if you are holding in your urine, then let go. You should feel the muscles tighten and move up and down. These are the same muscles you would tighten if you were trying to prevent yourself from passing gas.  It is very important that you keep the following muscles relaxed while doing pelvic floor muscle training exercises: Abdominal  Buttocks (the deeper, anal sphincter muscle should contract)  Thigh   A woman can also strengthen these muscles by using a vaginal cone, which is a weighted device that is inserted into the vagina. Then you try to tighten the pelvic floor muscles to hold the device in place. If you are unsure whether you are doing the pelvic floor muscle training correctly, you can use biofeedback and electrical stimulation to help find the correct muscle group to work. Biofeedback is a method of positive reinforcement. Electrodes are placed on the abdomen and along the anal area. Some therapists place a sensor in the vagina in women or anus in men to monitor the contraction of pelvic floor muscles.  A monitor will display a graph showing which muscles are contracting and which are at rest. The therapist can help find the right muscles for performing pelvic floor muscle training exercises.   PERFORMING PELVIC FLOOR EXERCISES: 1. Begin by emptying your bladder. 2. Tighten the pelvic floor muscles and hold for a count of 10. 3. Relax the muscles completely for a count of 10. 4. Do 10 repititions, 3 to 5 times a day (morning, afternoon, and night). You can do these exercises at any time and any place. Most people prefer to do the exercises while lying down or sitting in a chair. After 4 - 6 weeks, most people notice some improvement. It may take as long as 3 months to see a major change. After a couple of weeks, you can also try doing a single pelvic floor contraction at times when you are likely to leak (for example, while getting out of a  chair). A word of caution: Some people feel that they can speed up the progress by increasing the number of repetitions and the frequency of exercises. However, over-exercising can instead cause muscle fatigue and increase urine leakage. If you feel any discomfort in your abdomen or back while doing these exercises, you are probably doing them wrong. Breathe deeply and relax your body when you are doing these exercises. Make sure you are not tightening your stomach, thigh, buttock, or chest muscles. When done the right way, pelvic floor muscle exercises have been shown to be very effective at improving urinary continence. Alternative Names Kegel exercises

## 2012-10-08 NOTE — Progress Notes (Addendum)
Subjective:     Patient ID: Cory Harper, male   DOB: 09/13/1955, 57 y.o.   MRN: [id number]  HPI  Cory Harper  06/26/1956 [id number]  Patient Care Team: [name], MD as PCP - General (Family Medicine) Rachael Fee, MD as Consulting Physician (Gastroenterology) [name], MD as Consulting Physician (General Surgery)  This patient is a 57 y.o.male who presents today for surgical evaluation s/p:  POST-OPERATIVE DIAGNOSIS: loop ileostomy status post proctocolectomy IPAA Jpouch  PROCEDURE: Procedure(s):  Loop Ileostomy Takedown  EUA   Patient is one month status post ileostomy takedown.  He feels like he is doing well.  6-10 bowel movements a day.  Drinking liquids.  Not dehydrated.  Walking 2 miles a day.  Hoping to get back to work later this month.  No fevers chills or sweats.  He comes today with his wife.  No cramping or rectal bleeding.  No business.  No urgency.  No difficulty with urination.  They have been protecting the ileostomy closure incision in the chart actively over it.  Noticed a little mild redness.  He is due to followup with gastroenterology next week to discuss his screening for his familial polyposis.  Patient Active Problem List  Diagnosis  . DIABETES MELLITUS, TYPE II  . Obesity (BMI 30-39.9)  . HYPERTENSION, BENIGN ESSENTIAL  . ECZEMA  . Familial adenomatous polyposis coli, s/p proctocolectomy/J pouch 05/20/2012  . VITAMIN B12 DEFICIENCY  . UNSPECIFIED VITAMIN D DEFICIENCY  . Pure hypercholesterolemia  . 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 Cecal cancer, pT3pN0pM0 s/p abdominal colectomy 2013  . Anal polyp s/p polypectomy  . Hypothyroidism    Past Medical History  Diagnosis Date  . Diabetes mellitus   . Colon polyps     he had declined f/u colonoscopy after inital colonoscopy  . Nasal congestion   . Colonic mass   . Hyperlipidemia   . Heart murmur   . Cancer     stg II colon cancer  s/p resection  . Arthritis   . Hypertension     not on blood pressure meds since 11/13   . Hypothyroidism     Past Surgical History  Procedure Laterality Date  . Knee arthroscopy  1998    Right knee  . Upper gastrointestinal endoscopy    . Wisdom tooth extraction    . Colon resection  05/20/2012    Procedure: COLON RESECTION LAPAROSCOPIC;  Surgeon: Ardeth Sportsman, MD;  Location: [facility];  Service: General;  Laterality: N/A;  Laparoscopic Proctocolectomy, Ileal Pouch Anal Anastomoisis, Loop Ileostomy  . Bowel resection  05/20/2012  . Thyroid lobectomy  05/20/2012    Procedure: THYROID LOBECTOMY;  Surgeon: Ardeth Sportsman, MD;  Location: [facility];  Service: General;  Laterality: Left;  LEFT THYROID LOBECTOMY  . Ileostomy  05/20/12  . Flexible sigmoidoscopy  08/01/2012    Procedure: FLEXIBLE SIGMOIDOSCOPY;  Surgeon: [name], MD;  Location: [facility];  Service: Endoscopy;  Laterality: N/A;  use endoscope  . Ileostomy closure N/A 09/18/2012    Procedure: Loop Ileostomy Takedown with EUA ;  Surgeon: Ardeth Sportsman, MD;  Location: [facility];  Service: General;  Laterality: N/A;  loop ileostomy takedown with EUA     History   Social History  . Marital Status: Married    Spouse Name: June    Number of Children: 0  . Years of Education: N/A   Occupational History  .  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 Smoking status: Former Smoker    Quit date: 08/07/1991  . Smokeless tobacco: Never Used     Comment: quit in 1993  . Alcohol Use: No  . Drug Use: No  . Sexually Active: Not on file   Other Topics Concern  . 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 Uterine cancer Mother   . Hypertension Mother   . Stroke Mother   .  Colon cancer Mother   . Colon polyps Mother   . Cancer Mother     colon, endometrial  . Diabetes Father   . Obesity Father   . Pneumonia Father   . Cancer Father     skin - squamous   . Diabetes Paternal Grandmother   . Prostate cancer Neg Hx     Current Outpatient Prescriptions  Medication Sig Dispense Refill  . ACCU-CHEK AVIVA PLUS test strip       . cholecalciferol (VITAMIN D) 1000 UNITS tablet Take 1,000 Units by mouth 2 (two) times daily at 10 AM and 5 PM.       . ferrous sulfate 325 (65 FE) MG tablet Take 1 tablet (325 mg total) by mouth 2 (two) times daily with a meal.  60 tablet  2  . fluticasone (FLONASE) 50 MCG/ACT nasal spray Place 2 sprays into the nose daily.  16 g  12  . glimepiride (AMARYL) 2 MG tablet Take 2 mg by mouth 2 (two) times daily. If blood glucose is less than 70 patient does not take at bedtime      . levothyroxine (SYNTHROID, LEVOTHROID) 100 MCG tablet Take 100 mcg by mouth every morning.      . loratadine (CLARITIN) 10 MG tablet Take 10 mg by mouth daily as needed.      . metFORMIN (GLUCOPHAGE) 1000 MG tablet Take 1 tablet (1,000 mg total) by mouth 2 (two) times daily with a meal.  60 tablet  12  . rosuvastatin (CRESTOR) 10 MG tablet Take 1 tablet (10 mg total) by mouth at bedtime.  30 tablet  3  . saxagliptin HCl (ONGLYZA) 5 MG TABS tablet Take 1 tablet (5 mg total) by mouth daily with supper.  30 tablet  3  . vitamin B-12 (CYANOCOBALAMIN) 1000 MCG tablet Take 500 mcg by mouth daily.       Marland Kitchen loperamide (IMODIUM A-D) 2 MG tablet Take 1-2 tablets (2-4 mg total) by mouth 4 (four) times daily as needed for diarrhea or loose stools (Goal is 4-6 BMs / day). For diarrhea  50 tablet  5  . mupirocin ointment (BACTROBAN) 2 %        No current facility-administered medications for this visit.     Allergies  Allergen Reactions  . Ibuprofen     Joint swelling    BP 150/70  Pulse 70  Temp(Src) 97.6 F (36.4 C) (Temporal)  Resp 18  Ht 5\' 11"  (1.803 m)  Wt  241 lb 6.4 oz (109.498 kg)  BMI 33.68 kg/m2  No results found.   Review of Systems  Constitutional: Negative for fever, chills and diaphoresis.  HENT: Negative for sore  throat, trouble swallowing and neck pain.   Eyes: Negative for photophobia and visual disturbance.  Respiratory: Negative for choking and shortness of breath.   Cardiovascular: Negative for chest pain and palpitations.  Gastrointestinal: Negative for nausea, vomiting, abdominal distention, anal bleeding and rectal pain.  Genitourinary: Negative for dysuria, urgency, difficulty urinating and testicular pain.  Musculoskeletal: Negative for myalgias, arthralgias and gait problem.  Skin: Negative for color change and rash.  Neurological: Negative for dizziness, speech difficulty, weakness and numbness.  Hematological: Negative for adenopathy.  Psychiatric/Behavioral: Negative for hallucinations, confusion and agitation.       Objective:   Physical Exam  Constitutional: He is oriented to person, place, and time. He appears well-developed and well-nourished. No distress.  HENT:  Head: Normocephalic.  Mouth/Throat: Oropharynx is clear and moist. No oropharyngeal exudate.  Eyes: Conjunctivae and EOM are normal. Pupils are equal, round, and reactive to light. No scleral icterus.  Neck: Normal range of motion. No tracheal deviation present.  Cardiovascular: Normal rate, normal heart sounds and intact distal pulses.   Pulmonary/Chest: Effort normal. No respiratory distress.  Abdominal: Soft. He exhibits no distension. There is no tenderness. Hernia confirmed negative in the right inguinal area and confirmed negative in the left inguinal area.    Incisions clean with normal healing ridges.  No hernias  Musculoskeletal: Normal range of motion. He exhibits no tenderness.  Neurological: He is alert and oriented to person, place, and time. No cranial nerve deficit. He exhibits normal muscle tone. Coordination normal.  Skin: Skin  is warm and dry. No rash noted. He is not diaphoretic.  Psychiatric: He has a normal mood and affect. His behavior is normal.       Assessment:     Recovery were also is post ileostomy takedown.  Familial polyposis.  Need for followup on upper GI screening.     Plan:     Overall, I am happily surprised at how amazingly well he has done so far.  I noted a 4-8 bowel movements with a Jpouch is usual baseline for the rest of his life.  He is getting close to that range already.  Noted that Kaopectate and Imodium is reasonable to help slow things down.  Drink fluids to avoid dehydration.  Start adding fiber in the diet and transition to a high fiber diet as tolerated.  Mild erythema in the incision this part of the healing process.  He is almost a month out so I think his risk of wound infection is less.  Discussed warning signs of pouchitis such as rectal bleeding and urgency.  He was instructed to call us if he ever develops issues with that.  Dr. Maisie Fus is happy to manage that should it come to that.  Followup with gastroenterology for proximal digestive tract screening.  They are due to see Dr. Christella Hartigan in the next week or so to establish that.  Patient will require anoscopic / DRE screening of the coloanal anastomosis to make sure there is no evidence of any recurrence of polyps.  Followup in three months for initial anoscopy.  Then in six months.  Then probably annually.  Increase activity as tolerated to regular activity.  Do not push through pain.  Continue exercising regularly to continue to have a good healthy weight loss.  It is reasonable to return to work later this month.  He needs to be cleared for full intense activity so he needs a little more time.   Instructions discussed.  Followup with primary  care physician for other health issues as would normally be done.  Questions answered.  The patient expressed understanding and appreciation

## 2012-10-10 ENCOUNTER — Ambulatory Visit: Payer: BC Managed Care – PPO | Admitting: Gastroenterology

## 2012-10-14 ENCOUNTER — Ambulatory Visit: Payer: BC Managed Care – PPO | Admitting: Gastroenterology

## 2012-10-16 ENCOUNTER — Telehealth (INDEPENDENT_AMBULATORY_CARE_PROVIDER_SITE_OTHER): Payer: Self-pay

## 2012-10-16 NOTE — Telephone Encounter (Signed)
Pt calling to see if Dr Michaell Cowing has spoken to Dr Maisie Fus about the j-pouch surgery. Dr Michaell Cowing told the pt to call this week if he had not heard from [place]. Pls advise.

## 2012-10-21 ENCOUNTER — Other Ambulatory Visit: Payer: BC Managed Care – PPO

## 2012-10-21 ENCOUNTER — Encounter: Payer: Self-pay | Admitting: Gastroenterology

## 2012-10-21 ENCOUNTER — Ambulatory Visit (INDEPENDENT_AMBULATORY_CARE_PROVIDER_SITE_OTHER): Payer: BC Managed Care – PPO | Admitting: Gastroenterology

## 2012-10-21 VITALS — BP 110/60 | HR 68 | Ht 69.75 in | Wt 245.6 lb

## 2012-10-21 DIAGNOSIS — R933 Abnormal findings on diagnostic imaging of other parts of digestive tract: Secondary | ICD-10-CM

## 2012-10-21 NOTE — Patient Instructions (Addendum)
Recall EGD September 2014 (for FAP, ?celiac sprue). You will have labs checked today in the basement lab.  Please head down after you check out with the front desk  (celiac panel). Try imodium one pill every morning, very safe for your loose stools.

## 2012-10-21 NOTE — Progress Notes (Signed)
Review of pertinent gastrointestinal problems: 1. 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 2. Familial polyposis with multiple adenomatous polyps noted on the colectomy specimen 05/20/2012; originally he was diagnosed with FAP around year 2000 by Dr. Russella Dar but he was abstinent and declined surgical recommendations that were spoken to him and also put in writing. 3. EGD 04/2012 for UGI tract screening in FAP found abnormal duodenal mucosa, The biopsies of his upper intestines showed mild villous atrophy.  Celiac Panel was equivocal.  HPI: This is a  very pleasant 57 year old man whom I last saw several months ago.  He has had quite a lot of surgical, oncologic care since then. See the summary above.  Currently feels very well, he is very happy with his overall health these days.  Has 6-8 stools per day, on tract post ileo take down.    Past Medical History  Diagnosis Date  . Diabetes mellitus   . Nasal congestion   . Colonic mass   . Hyperlipidemia   . Heart murmur   . Cancer     stg II colon cancer s/p resection  . Arthritis   . Hypertension     not on blood pressure meds since 11/13   . Hypothyroidism   . FAP (familial adenomatous polyposis)     Past Surgical History  Procedure Laterality Date  . Knee arthroscopy  1998    Right knee  . Upper gastrointestinal endoscopy    . Wisdom tooth extraction    . Colon resection  05/20/2012    Procedure: COLON RESECTION LAPAROSCOPIC;  Surgeon: Ardeth Sportsman, MD;  Location: [facility];  Service: General;  Laterality: N/A;  Laparoscopic Proctocolectomy, Ileal Pouch Anal Anastomoisis, Loop Ileostomy  . Bowel resection  05/20/2012  . Thyroid lobectomy  05/20/2012    Procedure: THYROID LOBECTOMY;  Surgeon: Ardeth Sportsman, MD;  Location: [facility];  Service: General;  Laterality: Left;  LEFT THYROID LOBECTOMY  . Ileostomy  05/20/12   . Flexible sigmoidoscopy  08/01/2012    Procedure: FLEXIBLE SIGMOIDOSCOPY;  Surgeon: [name], MD;  Location: [facility];  Service: Endoscopy;  Laterality: N/A;  use endoscope  . Ileostomy closure N/A 09/18/2012    Procedure: Loop Ileostomy Takedown with EUA ;  Surgeon: Ardeth Sportsman, MD;  Location: [facility];  Service: General;  Laterality: N/A;  loop ileostomy takedown with EUA     Current Outpatient Prescriptions  Medication Sig Dispense Refill  . ACCU-CHEK AVIVA PLUS test strip       . cholecalciferol (VITAMIN D) 1000 UNITS tablet Take 1,000 Units by mouth 2 (two) times daily at 10 AM and 5 PM.       . ferrous sulfate 325 (65 FE) MG tablet Take 1 tablet (325 mg total) by mouth 2 (two) times daily with a meal.  60 tablet  2  . fluticasone (FLONASE) 50 MCG/ACT nasal spray Place 2 sprays into the nose daily.  16 g  12  . glimepiride (AMARYL) 2 MG tablet Take 2 mg by mouth 2 (two) times daily. If blood glucose is less than 70 patient does not take at bedtime      . levothyroxine (SYNTHROID, LEVOTHROID) 100 MCG tablet Take 100 mcg by mouth every morning.      . loperamide (IMODIUM A-D) 2 MG tablet Take 1-2 tablets (2-4 mg total) by mouth 4 (four) times daily as needed for diarrhea  or loose stools (Goal is 4-6 BMs / day). For diarrhea  50 tablet  5  . loratadine (CLARITIN) 10 MG tablet Take 10 mg by mouth daily as needed.      . metFORMIN (GLUCOPHAGE) 1000 MG tablet Take 1 tablet (1,000 mg total) by mouth 2 (two) times daily with a meal.  60 tablet  12  . rosuvastatin (CRESTOR) 10 MG tablet Take 1 tablet (10 mg total) by mouth at bedtime.  30 tablet  3  . saxagliptin HCl (ONGLYZA) 5 MG TABS tablet Take 1 tablet (5 mg total) by mouth daily with supper.  30 tablet  3  . vitamin B-12 (CYANOCOBALAMIN) 1000 MCG tablet Take 500 mcg by mouth daily.        No current facility-administered medications for this visit.    Allergies as of 10/21/2012 - Review Complete 10/21/2012  Allergen Reaction  Noted  . Ibuprofen  05/22/2006    Family History  Problem Relation Age of Onset  . Uterine cancer Mother   . Hypertension Mother   . Stroke Mother   . Colon cancer Mother   . Colon polyps Mother   . Cancer Mother     colon, endometrial  . Diabetes Father   . Obesity Father   . Pneumonia Father   . Cancer Father     skin - squamous   . Diabetes Paternal Grandmother   . Prostate cancer Neg Hx     History   Social History  . Marital Status: Married    Spouse Name: June    Number of Children: 0  . Years of Education: N/A   Occupational History  . 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 Smoking status: Former Smoker    Quit date: 08/07/1991  . Smokeless tobacco: Never Used     Comment: quit in 1993  . Alcohol Use: No  . Drug Use: No  . Sexually Active: Not on file   Other Topics Concern  . 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 male with FAP, question celiac sprue on previous duodenal biopsies and celiac sprue panel.  I'm going to repeat celiac panel today. Will plan on recall EGD with duodenal scope in September 2014 unless the celiac panel is highly suggestive this time and then I would speed up a bit. Normal interval for FAP upper GI tract surveillance is about every 3 years.

## 2012-10-22 LAB — CELIAC PANEL 10
Tissue Transglut Ab: 15.6 U/mL (ref <20)
Tissue Transglutaminase Ab, IgA: 10.1 U/mL (ref <20)

## 2012-10-23 NOTE — Telephone Encounter (Signed)
Returned pt's call after Dr Michaell Cowing spoke to Dr Maisie Fus about the pt's screening. I advised the pt that he needed to be followed closely every three months with anoscopy and poss. EUA the j-pouch really doensn't need monitoring unless if pt starts to have bleeding,pelvic pain, or pouchitis. The pt understands and will see [place] in June.

## 2012-10-23 NOTE — Telephone Encounter (Signed)
Discussed with Dr. Maisie Fus.  Anoderm at risk for recurrence of polyp/cancer.  Heart he set up for screening.  No special monitoring of the ileal pouch needed.  Patient has been instructed to look for evidence of pelvic pain/bleeding/pouchitis.  If he has problems with pouchitis or other concerns of the pouch symptomatically, Dr. Maisie Fus is happy to be available to help troubleshoot that with [place].  She did not feel he needed any special followup with her as long as the ileoanal region is regularly screened to rule out polyps

## 2012-11-10 ENCOUNTER — Telehealth: Payer: Self-pay | Admitting: Gastroenterology

## 2012-11-10 NOTE — Telephone Encounter (Signed)
Pt would like to have a copy of lab genetic test done in 2002 for familial polyposis.  I have ordered the chart and will call him tomorrow with what I find.  He would like the result faxed to [phone].

## 2012-11-11 NOTE — Telephone Encounter (Signed)
Labs were faxed per pt request pt informed

## 2012-12-06 ENCOUNTER — Other Ambulatory Visit: Payer: Self-pay | Admitting: Family Medicine

## 2012-12-16 ENCOUNTER — Telehealth: Payer: Self-pay | Admitting: Oncology

## 2012-12-16 ENCOUNTER — Ambulatory Visit (HOSPITAL_BASED_OUTPATIENT_CLINIC_OR_DEPARTMENT_OTHER): Payer: BC Managed Care – PPO | Admitting: Lab

## 2012-12-16 ENCOUNTER — Other Ambulatory Visit (HOSPITAL_BASED_OUTPATIENT_CLINIC_OR_DEPARTMENT_OTHER): Payer: BC Managed Care – PPO

## 2012-12-16 ENCOUNTER — Ambulatory Visit (HOSPITAL_BASED_OUTPATIENT_CLINIC_OR_DEPARTMENT_OTHER): Payer: BC Managed Care – PPO | Admitting: Oncology

## 2012-12-16 VITALS — BP 127/72 | HR 92 | Temp 98.1°F | Resp 18 | Ht 69.75 in | Wt 261.3 lb

## 2012-12-16 DIAGNOSIS — C180 Malignant neoplasm of cecum: Secondary | ICD-10-CM

## 2012-12-16 LAB — CEA: CEA: 0.5 ng/mL (ref 0.0–5.0)

## 2012-12-16 NOTE — Telephone Encounter (Signed)
gv and printed appt sched and avs for pt  °

## 2012-12-16 NOTE — Progress Notes (Signed)
   [facility]    OFFICE PROGRESS NOTE   INTERVAL HISTORY:   He returns as scheduled. He feels well. No specific complaint.he underwent ileostomy takedown procedure on 09/18/2012. He is followed by Dr. Christella Hartigan for surveillance endoscopies.  Objective:  Vital signs in last 24 hours:  Blood pressure 127/72, pulse 92, temperature 98.1 F (36.7 C), resp. rate 18, height 5' 9.75" (1.772 m), weight 261 lb 4.8 oz (118.525 kg), SpO2 100.00%.    HEENT: neck without mass Lymphatics: prominent bilateral axillary fat pads. No cervical, supraclavicular, or inguinal nodes Resp: lungs clear bilaterally Cardio: regular rate and rhythm GI: no hepatomegaly, nontender, no mass Vascular: no leg edema  Lab Results:  [name] pending   Medications: I have reviewed the patient's current medications.  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 Ileostomy takedown 09/18/2012 2. Familial polyposis with multiple adenomatous polyps noted on the colectomy specimen 05/20/2012 -followed by Dr. Christella Hartigan for surveillance endoscopies 3. 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 Diabetes     Disposition:  Mr. Schake remains in clinical remission from colon cancer. We will followup on the [name] from today. He will return for an office visit and [name] in 6 months.   Thornton Papas, MD  12/16/2012  9:34 PM

## 2012-12-22 ENCOUNTER — Telehealth: Payer: Self-pay | Admitting: *Deleted

## 2012-12-22 NOTE — Telephone Encounter (Signed)
No answer

## 2012-12-23 ENCOUNTER — Telehealth: Payer: Self-pay | Admitting: *Deleted

## 2012-12-23 NOTE — Telephone Encounter (Signed)
Message copied by Caleb Popp on Tue Dec 23, 2012  7:16 PM ------      Message from: Thornton Papas B      Created: Wed Dec 17, 2012  7:50 PM       Please call patient, cea is normal ------

## 2012-12-23 NOTE — Telephone Encounter (Signed)
Called pt with [name] results. He voiced appreciation for call.  

## 2012-12-24 ENCOUNTER — Telehealth (INDEPENDENT_AMBULATORY_CARE_PROVIDER_SITE_OTHER): Payer: Self-pay

## 2012-12-24 NOTE — Telephone Encounter (Signed)
Called to give ok on refill Levothyroxine #30 [id number] phoned to [facility] per Dr Michaell Cowing. I did advise that Dr Michaell Cowing will no longer prescribe after this refill the pt needs to get the PCP to fill the rx for now on per Dr Michaell Cowing.

## 2013-01-19 ENCOUNTER — Other Ambulatory Visit (INDEPENDENT_AMBULATORY_CARE_PROVIDER_SITE_OTHER): Payer: BC Managed Care – PPO

## 2013-01-19 ENCOUNTER — Other Ambulatory Visit: Payer: Self-pay | Admitting: Family Medicine

## 2013-01-19 ENCOUNTER — Encounter (INDEPENDENT_AMBULATORY_CARE_PROVIDER_SITE_OTHER): Payer: Self-pay | Admitting: Surgery

## 2013-01-19 ENCOUNTER — Ambulatory Visit (INDEPENDENT_AMBULATORY_CARE_PROVIDER_SITE_OTHER): Payer: BC Managed Care – PPO | Admitting: Surgery

## 2013-01-19 VITALS — BP 158/80 | HR 76 | Resp 16 | Ht 71.0 in | Wt 266.6 lb

## 2013-01-19 DIAGNOSIS — E039 Hypothyroidism, unspecified: Secondary | ICD-10-CM

## 2013-01-19 DIAGNOSIS — Z85038 Personal history of other malignant neoplasm of large intestine: Secondary | ICD-10-CM

## 2013-01-19 DIAGNOSIS — C180 Malignant neoplasm of cecum: Secondary | ICD-10-CM

## 2013-01-19 DIAGNOSIS — E119 Type 2 diabetes mellitus without complications: Secondary | ICD-10-CM

## 2013-01-19 DIAGNOSIS — K620 Anal polyp: Secondary | ICD-10-CM

## 2013-01-19 DIAGNOSIS — D126 Benign neoplasm of colon, unspecified: Secondary | ICD-10-CM

## 2013-01-19 LAB — TSH: TSH: 2.22 u[IU]/mL (ref 0.35–5.50)

## 2013-01-19 NOTE — Patient Instructions (Addendum)
Diet According to a survey of [facility] patients who have had an ileoanal reservoir procedure and have a J-pouch, the majority of patients change the way they eat after their operation. Some people are able to eat more foods compared to the diet they followed with ulcerative colitis and before having their operation. Others have a diet with similar foods. Since many people have frequent bowel movements right after the operation, following a diet can help reduce the number of stools. Incorporate new foods into your diet one at a time to see how they affect your stool output. You may find that some foods give you trouble initially, but that you can tolerate them better later.  Do not skip meals. This tends to make the stools more irritating and loose.  Balancing starches with foods that tend to give diarrhea is also helpful in controlling the frequency of bowel movements.  Once your colon has been removed, you will need more salt until your body adjusts to not having a colon. Pretzels and corn chips are good snacks.  Hot and spicy foods will probably burn on the way out and should be avoided if your anal area feels irritated.  Seeds and nuts also can be irritating.  Within three to nine months after surgery, your body will have started to adjust to having a J-pouch. At this point, you should try to eat all types of foods and see how they affect you. Some patients have tried a low glycemic index diet, which includes foods that are high in fiber and cause a slow rise in blood sugar, to help control their bowel movements. Resources on the Internet and at your [employer] are available for more information on this type of diet.  Fluids are important to prevent dehydration. Drink enough fluid so your urine is light yellow in color. Avoid fruit juices, carbonated beverages, drinks with caffeine and straws (swallowed air increases gas). Rather than drinking fluids with your meal, try drinking fluids at the end of your  meal, which may help slow down your stool.  Many people ask about vitamin supplements after their operation when they are limiting fruits and vegetables. It is fine to take vitamins, but they should be chewable or in liquid form, otherwise they may not be fully absorbed.  Stool Frequency According to a survey of [facility] patients who have had an ileoanal reservoir procedure and have a J-pouch, about half of the patients have between five to eight stools a day. About 30 percent have nine to 12 stools a day. Older patients - those over 78 - have more stools than younger patients. Less than 8 percent of patients have less than four stools a day. About 9 percent have over 13 stools a day. As the reservoir adapts and stretches to its normal capacity, the number of stools you have per day should decrease. You will probably have many stools while you are in the hospital, but these should lessen by the time you go home. Often the first stools are without your control while in the hospital, though this is resolved before you go home. The biggest increase in stools usually occurs after you begin eating. You can expect your number of bowel movements to decrease gradually. The stools will range from watery to a paste-like consistency. By watching your diet, you can avoid foods that tend to produce loose stools. The more frequent your bowel movements, the more itching and burning you will have around your anus. A combined approach of  diet and medications, such as Imodium and Lomotil, may help decrease your number of bowel movements. Most people need to take these medications more frequently right after having their ileoanal reservoir procedure, but only use them occasionally as their reservoir function improves. For some people, adding a fiber supplement, such as Metamucil, Benefiber, Konsyl, Citracel or a generic equivalent, and taking them with half the recommended amount of water, can help thicken their stool and reduce the  number of bowel movements. Nighttime Stool Frequency Stool frequency at night is a common problem and can be very disruptive of a good night's sleep. More than half of [facility] patients surveyed - about 63 percent - get up once or twice per night to pass stool, and about 24 percent of patients awaken three times or more during the night to have a bowel movement. This can be related to eating late, overeating or eating foods known to cause problems. Tips for decreasing the number of stools at night include: Don't eat late. Wait at least three to four hours after your last meal before going to bed.  Take an anti-diarrheal medication before going to bed.  Eat binding foods at dinner and avoid those foods that tend to cause diarrhea.  Don't overeat at dinner. Diarrhea Diarrhea occurs when your stool is very watery and more frequent than usual. Diarrhea can be caused by eating certain types of foods, eating too much of any food, pouchitis and other illnesses like the flu. If you develop the flu early in your recovery and have diarrhea, you may become dehydrated and need to be hospitalized. Sometimes people have diarrhea because they don't have the enzyme called lactase needed to digest the sugar in milk products. If you feel bloated and have cramps and gas after drinking milk, try not drinking it and see if symptoms go away. You can also try fermented milk products, such as yogurt, hard cheese and buttermilk, as well as soy or goat's milk. Adding fiber supplements, such as Metamucil, Benefiber, Konsyl, Citracel or the generic equivalent, can help thicken your stool and reduce diarrhea. Gatorade or other sport drinks can also be helpful in treating diarrhea by keeping you hydrated. Skin Care Until your body adjusts to your new internal pouch, you may experience some leakage of stool, especially at night. Liquid stool is very irritating to the skin around the anus, so it is important to keep this area clean and  dry. Use soft, white, non-perfumed toilet tissue to blot gently after each bowel movement, drying the skin completely. Do not scratch, rub or wipe the skin. It may help to spray water from a squirt bottle. Some people find drying the skin with a blow dryer helps. For irritated skin around the anus, warm baths are very soothing. Do not apply anything to the skin that burns or irritates. Moist cotton balls may be better for wiping if your skin is very sore. Some people use baby wipes to cleanse themselves. Use a brand without alcohol or scents. Some people tuck a small piece of toilet tissue or a dry cotton ball near the anal opening to protect their skin from small amounts of leakage. Scented soaps and tissues should not be used since these may cause irritation. A protective ointment may be used after each bowel movement to keep the stool off the skin. You may want to wear a protective pad to keep your clothing clean. Anal itching and burning are often not visible on the outside because the irritated area  is actually on the inside. You can look with a mirror to see if your irritation is on the outside skin. Some of the itching and burning is a normal part of the internal healing and will go away in time. Some people have found Pepto Bismol taken by mouth is helpful for the burning. Sometimes people develop a yeast infection around the anal area. It happens more commonly when people are taking antibiotics, or in women around the time of their period. If you get an itchy red rash, you may need an anti-fungal cream or ointment. This is available over the counter. Occasionally people are allergic to the preparations they are using around their anus. This is not very common. Some products contain lanolin - if you are allergic to this, check the ingredients of ointments before using. Incontinence Mild incontinence or leakage of stool from your J-pouch is a common problem that improves with time as your stool  thickens, your pouch stretches and your sphincters become stronger. The incontinence is usually worse at night when your sphincters relax. The looser the stool, the more likely it is to leak. Some people accidentally pass stool when they are passing gas. In time, most people develop an awareness of whether they are passing stool or gas, but initially this can be a problem. It is also important to note that if you develop incontinence later on, you may have pouchitis. According to our survey, 60 percent of people occasionally pass stool without control, usually at night while in a deep sleep. About 8 percent occasionally pass some liquid stool during the day. About 35 percent notice staining of stool on their underwear at least once a month during the day or the night. Often this can be related to eating something that causes a problem, having pouchitis, overeating, going to bed soon after eating, or just being in a very deep sleep. Medications, diet and careful skin care are all important during this period of adaptation. A small pad helps absorb leakage. Some people find sleeping on a small towel or pad also is helpful. Before your operation, it is a good idea to buy more cotton underwear.  GETTING TO GOOD BOWEL HEALTH. Irregular bowel habits such as constipation and diarrhea can lead to many problems over time.  Having one soft bowel movement a day is the most important way to prevent further problems.  The anorectal canal is designed to handle stretching and feces to safely manage our ability to get rid of solid waste (feces, poop, stool) out of our body.  BUT, hard constipated stools can act like ripping concrete bricks and diarrhea can be a burning fire to this very sensitive area of our body, causing inflamed hemorrhoids, anal fissures, increasing risk is perirectal abscesses, abdominal pain/bloating, an making irritable bowel worse.     The goal: ONE SOFT BOWEL MOVEMENT A DAY!  To have soft, regular  bowel movements:    Drink at least 8 tall glasses of water a day.     Take plenty of fiber.  Fiber is the undigested part of plant food that passes into the colon, acting s "natures broom" to encourage bowel motility and movement.  Fiber can absorb and hold large amounts of water. This results in a larger, bulkier stool, which is soft and easier to pass. Work gradually over several weeks up to 6 servings a day of fiber (25g a day even more if needed) in the form of: o Vegetables -- Root (potatoes, carrots, turnips),  leafy green (lettuce, salad greens, celery, spinach), or cooked high residue (cabbage, broccoli, etc) o Fruit -- Fresh (unpeeled skin & pulp), Dried (prunes, apricots, cherries, etc ),  or stewed ( applesauce)  o Whole grain breads, pasta, etc (whole wheat)  o Bran cereals    Bulking Agents -- This type of water-retaining fiber generally is easily obtained each day by one of the following:  o Psyllium bran -- The psyllium plant is remarkable because its ground seeds can retain so much water. This product is available as Metamucil, Konsyl, Effersyllium, Per Diem Fiber, or the less expensive generic preparation in drug and health food stores. Although labeled a laxative, it really is not a laxative.  o Methylcellulose -- This is another fiber derived from wood which also retains water. It is available as Citrucel. o Polyethylene Glycol - and "artificial" fiber commonly called Miralax or Glycolax.  It is helpful for people with gassy or bloated feelings with regular fiber o Flax Seed - a less gassy fiber than psyllium   No reading or other relaxing activity while on the toilet. If bowel movements take longer than 5 minutes, you are too constipated   AVOID CONSTIPATION.  High fiber and water intake usually takes care of this.  Sometimes a laxative is needed to stimulate more frequent bowel movements, but    Laxatives are not a good long-term solution as it can wear the colon out. o Osmotics  (Milk of Magnesia, Fleets phosphosoda, Magnesium citrate, MiraLax, GoLytely) are safer than  o Stimulants (Senokot, Castor Oil, Dulcolax, Ex Lax)    o Do not take laxatives for more than 7days in a row.    IF SEVERELY CONSTIPATED, try a Bowel Retraining Program: o Do not use laxatives.  o Eat a diet high in roughage, such as bran cereals and leafy vegetables.  o Drink six (6) ounces of prune or apricot juice each morning.  o Eat two (2) large servings of stewed fruit each day.  o Take one (1) heaping tablespoon of a psyllium-based bulking agent twice a day. Use sugar-free sweetener when possible to avoid excessive calories.  o Eat a normal breakfast.  o Set aside 15 minutes after breakfast to sit on the toilet, but do not strain to have a bowel movement.  o If you do not have a bowel movement by the third day, use an enema and repeat the above steps.    Controlling diarrhea o Switch to liquids and simpler foods for a few days to avoid stressing your intestines further. o Avoid dairy products (especially milk & ice cream) for a short time.  The intestines often can lose the ability to digest lactose when stressed. o Avoid foods that cause gassiness or bloating.  Typical foods include beans and other legumes, cabbage, broccoli, and dairy foods.  Every person has some sensitivity to other foods, so listen to our body and avoid those foods that trigger problems for you. o Adding fiber (Citrucel, Metamucil, psyllium, Miralax) gradually can help thicken stools by absorbing excess fluid and retrain the intestines to act more normally.  Slowly increase the dose over a few weeks.  Too much fiber too soon can backfire and cause cramping & bloating. o Probiotics (such as active yogurt, Align, etc) may help repopulate the intestines and colon with normal bacteria and calm down a sensitive digestive tract.  Most studies show it to be of mild help, though, and such products can be costly. o Medicines:   Bismuth  subsalicylate (ex. Kayopectate, Pepto Bismol) every 30 minutes for up to 6 doses can help control diarrhea.  Avoid if pregnant.   Loperamide (Immodium) can slow down diarrhea.  Start with two tablets (4mg  total) first and then try one tablet every 6 hours.  Avoid if you are having fevers or severe pain.  If you are not better or start feeling worse, stop all medicines and call your doctor for advice o Call your doctor if you are getting worse or not better.  Sometimes further testing (cultures, endoscopy, X-ray studies, bloodwork, etc) may be needed to help diagnose and treat the cause of the diarrhea. o

## 2013-01-19 NOTE — Progress Notes (Signed)
Subjective:     Patient ID: Cory Harper, male   DOB: 05-29-1956, 57 y.o.   MRN: [id number]  HPI   UNKNOWN SCHLEYER  [birth year] [id number]  Patient Care Team: [name], MD as PCP - General (Family Medicine) Rachael Fee, MD as Consulting Physician (Gastroenterology) [name], MD as Consulting Physician (General Surgery)  This patient is a 57 y.o.male who presents today for surgical evaluation s/p:  POST-OPERATIVE DIAGNOSIS: loop ileostomy status post proctocolectomy IPAA Jpouch  PROCEDURE: Procedure(s):  Loop Ileostomy Takedown  EUA   Patient is one month status post ileostomy takedown.  He feels like he is doing well.  6-10 bowel movements a day.  Taking Imodium in the morning only.  Has to get up at night maybe one time.  Drinking liquids.  Not dehydrated.  Walking well.  Back to work.  Regained some weight.  Trying to get it back down.  No fevers chills or sweats.  He comes today with his wife.  No cramping or rectal bleeding.  No Incontinence to flatus nor stool.  No "accidents"  No urgency.  No difficulty with urination.    He saw medical oncology.  [name] was normal.   Ref. Range 12/16/2012 11:38  [name] Latest Range: 0.0-5.0 ng/mL 0.5   Due to followup in the winter  Patient Active Problem List   Diagnosis Date Noted  . Familial adenomatous polyposis coli, s/p proctocolectomy/J pouch 05/20/2012 02/27/2007    Priority: High  . Cecal cancer, pT3pN0pM0 s/p abdominal colectomy 2013 05/20/2012    Priority: Medium  . Anal polyp s/p polypectomy 05/20/2012    Priority: Medium  . Hypothyroidism 06/25/2012  . 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 Pure hypercholesterolemia 01/30/2012  . VITAMIN B12 DEFICIENCY 09/27/2010  . UNSPECIFIED VITAMIN D DEFICIENCY 09/27/2010  . HYPERTENSION, BENIGN ESSENTIAL 08/04/2007  . DIABETES MELLITUS, TYPE II 02/27/2007  . Obesity (BMI 30-39.9) 02/27/2007  . ECZEMA 02/27/2007     Past Medical History  Diagnosis Date  . Diabetes mellitus   . Nasal congestion   . Colonic mass   . Hyperlipidemia   . Heart murmur   . Cancer     stg II colon cancer s/p resection  . Arthritis   . Hypertension     not on blood pressure meds since 11/13   . Hypothyroidism   . FAP (familial adenomatous polyposis)     Past Surgical History  Procedure Laterality Date  . Knee arthroscopy  1998    Right knee  . Upper gastrointestinal endoscopy    . Wisdom tooth extraction    . Colon resection  05/20/2012    Procedure: COLON RESECTION LAPAROSCOPIC;  Surgeon: Ardeth Sportsman, MD;  Location: [facility];  Service: General;  Laterality: N/A;  Laparoscopic Proctocolectomy, Ileal Pouch Anal Anastomoisis, Loop Ileostomy  . Bowel resection  05/20/2012  . Thyroid lobectomy  05/20/2012    Procedure: THYROID LOBECTOMY;  Surgeon: Ardeth Sportsman, MD;  Location: [facility];  Service: General;  Laterality: Left;  LEFT THYROID LOBECTOMY  . Ileostomy  05/20/12  . Flexible sigmoidoscopy  08/01/2012    Procedure: FLEXIBLE SIGMOIDOSCOPY;  Surgeon: [name], MD;  Location: [facility];  Service: Endoscopy;  Laterality: N/A;  use endoscope  . Ileostomy closure N/A 09/18/2012    Procedure: Loop Ileostomy Takedown with EUA ;  Surgeon: Ardeth Sportsman, MD;  Location: [facility];  Service: General;  Laterality: N/A;  loop ileostomy  takedown with EUA     History   Social History  . Marital Status: Married    Spouse Name: June    Number of Children: 0  . Years of Education: N/A   Occupational History  . 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 Smoking status: Former Smoker    Quit date: 08/07/1991  . Smokeless tobacco: Never Used     Comment: quit in 1993  . Alcohol Use: No  . Drug Use: No  . Sexually Active: Not on file   Other Topics Concern  . 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 Uterine cancer Mother   . Hypertension Mother   . Stroke Mother   . Colon cancer Mother   . Colon polyps Mother   . Cancer Mother     colon, endometrial  . Diabetes Father   . Obesity Father   . Pneumonia Father   . Cancer Father     skin - squamous   . Diabetes Paternal Grandmother   . Prostate cancer Neg Hx     Current Outpatient Prescriptions  Medication Sig Dispense Refill  . ACCU-CHEK AVIVA PLUS test strip TEST twice a day  100 each  9  . cholecalciferol (VITAMIN D) 1000 UNITS tablet Take 1,000 Units by mouth 2 (two) times daily at 10 AM and 5 PM.       . ferrous sulfate 325 (65 FE) MG tablet Take 1 tablet (325 mg total) by mouth 2 (two) times daily with a meal.  60 tablet  2  . fluticasone (FLONASE) 50 MCG/ACT nasal spray Place 2 sprays into the nose daily.  16 g  12  . glimepiride (AMARYL) 2 MG tablet Take 2 mg by mouth 2 (two) times daily. If blood glucose is less than 70 patient does not take at bedtime      . levothyroxine (SYNTHROID, LEVOTHROID) 100 MCG tablet Take 100 mcg by mouth every morning.      . loperamide (IMODIUM A-D) 2 MG tablet Take 1-2 tablets (2-4 mg total) by mouth 4 (four) times daily as needed for diarrhea or loose stools (Goal is 4-6 BMs / day). For diarrhea  50 tablet  5  . loratadine (CLARITIN) 10 MG tablet Take 10 mg by mouth daily as needed.      . metFORMIN (GLUCOPHAGE) 1000 MG tablet Take 1 tablet (1,000 mg total) by mouth 2 (two) times daily with a meal.  60 tablet  12  . rosuvastatin (CRESTOR) 10 MG tablet Take 1 tablet (10 mg total) by mouth at bedtime.  30 tablet  3  . saxagliptin HCl (ONGLYZA) 5 MG TABS tablet Take 1 tablet (5 mg total) by mouth daily with supper.  30 tablet  3  . vitamin B-12 (CYANOCOBALAMIN) 1000 MCG tablet Take 500 mcg by mouth daily.        No current  facility-administered medications for this visit.     Allergies  Allergen Reactions  . Ibuprofen     Joint swelling    BP 158/80  Pulse 76  Resp 16  Ht 5\' 11"  (1.803 m)  Wt 266 lb 9.6 oz (120.929  kg)  BMI 37.2 kg/m2  No results found.   Review of Systems  Constitutional: Negative for fever, chills and diaphoresis.  HENT: Negative for sore throat, trouble swallowing and neck pain.   Eyes: Negative for photophobia and visual disturbance.  Respiratory: Negative for choking and shortness of breath.   Cardiovascular: Negative for chest pain and palpitations.  Gastrointestinal: Positive for diarrhea. Negative for nausea, vomiting, abdominal pain, constipation, blood in stool, abdominal distention, anal bleeding and rectal pain.  Genitourinary: Negative for dysuria, urgency, difficulty urinating and testicular pain.  Musculoskeletal: Negative for myalgias, arthralgias and gait problem.  Skin: Negative for color change and rash.  Neurological: Negative for dizziness, speech difficulty, weakness and numbness.  Hematological: Negative for adenopathy.  Psychiatric/Behavioral: Negative for hallucinations, confusion and agitation.       Objective:   Physical Exam  Constitutional: He is oriented to person, place, and time. He appears well-developed and well-nourished. No distress.  HENT:  Head: Normocephalic.  Mouth/Throat: Oropharynx is clear and moist. No oropharyngeal exudate.  Eyes: Conjunctivae and EOM are normal. Pupils are equal, round, and reactive to light. No scleral icterus.  Neck: Normal range of motion. No tracheal deviation present.  Cardiovascular: Normal rate, normal heart sounds and intact distal pulses.   Pulmonary/Chest: Effort normal. No respiratory distress.  Abdominal: Soft. He exhibits no distension. There is no tenderness. Hernia confirmed negative in the right inguinal area and confirmed negative in the left inguinal area.    Incisions clean with normal  healing ridges.  No hernias  Genitourinary:  Exam done with assistance of male Medical Assistant in the room.  Perianal skin clean with good hygiene.  Some mild chronic thickening intergluteal but no pruritis.  No external skin tags / hemorrhoids of significance.  No pilonidal disease.  No fissure.  No abscess/fistula.    Tolerates digital and anoscopic rectal exam.  Normal sphincter tone.  Anastomosis 2cm from anal verge - smooth.  No distal pouch masses.    Musculoskeletal: Normal range of motion. He exhibits no tenderness.  Neurological: He is alert and oriented to person, place, and time. No cranial nerve deficit. He exhibits normal muscle tone. Coordination normal.  Skin: Skin is warm and dry. No rash noted. He is not diaphoretic.  Psychiatric: He has a normal mood and affect. His behavior is normal.       Assessment:     No evidence of polyps or other pathology perianal in patient with familial polyposis & right colon cancer s/p Abdomino-proctocolectomy & IPAA J pouch.  Need for followup on upper GI screening.     Plan:     I am happy at how well he has done so far.  I noted a 4-8 bowel movements with a Jpouch is usual baseline for the rest of his life.  He is getting close to that range already.  Noted that Kaopectate and Imodium is reasonable to help slow things down.  Drink fluids to avoid dehydration.    Discussed warning signs of pouchitis such as rectal bleeding and urgency.  He was instructed to call us if he ever develops issues with that.  Dr. Maisie Fus is happy to manage that should it come to that.  Followup with gastroenterology for proximal digestive tract screening.  They are due to see Dr. Christella Hartigan in the fall to establish that.  Patient will require anoscopic / DRE screening of the coloanal anastomosis to make sure there is no evidence of any recurrence of polyps.  Followup q  6 months x2, then annually  Increase activity as tolerated to regular activity.  Do not push  through pain.  Continue exercising regularly to continue to have a good healthy weight loss.  It is reasonable to return to work later this month.  He needs to be cleared for full intense activity so he needs a little more time.   Instructions discussed.  Followup with primary care physician for other health issues as would normally be done.  Questions answered.  The patient expressed understanding and appreciation

## 2013-01-20 ENCOUNTER — Other Ambulatory Visit: Payer: Self-pay | Admitting: Family Medicine

## 2013-01-20 NOTE — Telephone Encounter (Signed)
Pt called back and notified as instructed; pt will come fasting for his appt on 01/26/13 at 8:15 am.

## 2013-01-20 NOTE — Telephone Encounter (Signed)
Left message on answering machine to call back.

## 2013-01-20 NOTE — Telephone Encounter (Signed)
If he wants to get labs at the OV next week, then try to come in fasting.  rx sent.  Thanks.

## 2013-01-20 NOTE — Telephone Encounter (Signed)
Please advise when patient needs lab work to check his cholesterol and liver function. Is it okay to refill medication?  Last office visit  09/09/12.

## 2013-01-26 ENCOUNTER — Ambulatory Visit (INDEPENDENT_AMBULATORY_CARE_PROVIDER_SITE_OTHER): Payer: BC Managed Care – PPO | Admitting: Family Medicine

## 2013-01-26 ENCOUNTER — Encounter: Payer: Self-pay | Admitting: Family Medicine

## 2013-01-26 VITALS — BP 124/76 | HR 71 | Temp 97.7°F | Wt 253.5 lb

## 2013-01-26 DIAGNOSIS — E119 Type 2 diabetes mellitus without complications: Secondary | ICD-10-CM

## 2013-01-26 DIAGNOSIS — C180 Malignant neoplasm of cecum: Secondary | ICD-10-CM

## 2013-01-26 DIAGNOSIS — E039 Hypothyroidism, unspecified: Secondary | ICD-10-CM

## 2013-01-26 NOTE — Patient Instructions (Addendum)
I'll check on the ultrasound.  Recheck fasting labs in about 3 months before a visit.  Work on walking more.   Take care.   Glad to see you.

## 2013-01-26 NOTE — Progress Notes (Signed)
S/p colectomy.  6-8 stools per day.  Taking imodium prn (rarely) with some help. He's going to take it about BID from this point on.  No blood in stool.  No fevers.    Diabetes:  Using medications without difficulties:yes Hypoglycemic episodes:no Hyperglycemic episodes:no Feet problems:no Blood Sugars averaging: ~120s He is back to walking some, but not as much as previous.  A1c 7.7.    Hypothyroidism.  S/p L hemithyroidectomy. TSH wnl.  Compliant with meds.  No neck pain.  F/u US scheduled for 10/14.  Path reviewed with patient.    PMH and SH reviewed  Meds, vitals, and allergies reviewed.   ROS: See HPI.  Otherwise negative.    GEN: nad, alert and oriented HEENT: mucous membranes moist NECK: supple w/o LA, no TMG, no mass noted CV: rrr. PULM: ctab, no inc wob ABD: soft, +bs, healed scars noted.  EXT: no edema SKIN: no acute rash  Diabetic foot exam: Normal inspection No skin breakdown No calluses  Normal DP pulses Normal sensation to light touch and monofilament Nails normal

## 2013-01-27 ENCOUNTER — Encounter: Payer: Self-pay | Admitting: Family Medicine

## 2013-01-27 ENCOUNTER — Telehealth: Payer: Self-pay | Admitting: *Deleted

## 2013-01-27 NOTE — Telephone Encounter (Signed)
Is it necessary to include the J-Pouch?

## 2013-01-27 NOTE — Telephone Encounter (Signed)
I would put DM2.  Thanks.

## 2013-01-27 NOTE — Assessment & Plan Note (Signed)
Due for f/u thyroid u/s in fall 2014.  D/w pt.  Order is already in.  Continue current meds.  TSH wnl.

## 2013-01-27 NOTE — Telephone Encounter (Signed)
Left detailed message on voicemail.  

## 2013-01-27 NOTE — Assessment & Plan Note (Signed)
He'll work on diet and more exercise.  Not change in meds.  recheck A1c in about 3 months.  He agrees.  Labs d/w pt.

## 2013-01-27 NOTE — Telephone Encounter (Signed)
Patient is getting a Arboriculturist and wanted to know what to put on it other than the J-Pouch.  ? DM, etc.  Please advise.

## 2013-01-27 NOTE — Telephone Encounter (Signed)
I would, with the Dm2.  Thanks.

## 2013-01-27 NOTE — Assessment & Plan Note (Signed)
S/p colectomy and with sig improvement in the meantime.  See above re: discussion of imodium.

## 2013-01-28 ENCOUNTER — Other Ambulatory Visit: Payer: Self-pay | Admitting: Family Medicine

## 2013-02-15 ENCOUNTER — Other Ambulatory Visit: Payer: Self-pay | Admitting: Family Medicine

## 2013-02-18 ENCOUNTER — Other Ambulatory Visit: Payer: Self-pay | Admitting: Family Medicine

## 2013-02-19 ENCOUNTER — Other Ambulatory Visit: Payer: Self-pay | Admitting: *Deleted

## 2013-02-19 MED ORDER — LEVOTHYROXINE SODIUM 100 MCG PO TABS: 100.0000 ug | ORAL_TABLET | Freq: Every morning | ORAL | Status: DC

## 2013-02-20 ENCOUNTER — Encounter: Payer: Self-pay | Admitting: Gastroenterology

## 2013-02-26 ENCOUNTER — Encounter: Payer: Self-pay | Admitting: Gastroenterology

## 2013-03-09 ENCOUNTER — Other Ambulatory Visit: Payer: Self-pay | Admitting: Family Medicine

## 2013-03-11 ENCOUNTER — Ambulatory Visit (AMBULATORY_SURGERY_CENTER): Payer: BC Managed Care – PPO

## 2013-03-11 VITALS — Ht 71.0 in | Wt 279.6 lb

## 2013-03-11 DIAGNOSIS — D126 Benign neoplasm of colon, unspecified: Secondary | ICD-10-CM

## 2013-03-12 ENCOUNTER — Encounter: Payer: Self-pay | Admitting: Gastroenterology

## 2013-03-24 ENCOUNTER — Encounter: Payer: Self-pay | Admitting: Gastroenterology

## 2013-03-24 ENCOUNTER — Ambulatory Visit (AMBULATORY_SURGERY_CENTER): Payer: BC Managed Care – PPO | Admitting: Gastroenterology

## 2013-03-24 VITALS — BP 124/64 | HR 73 | Temp 98.3°F | Resp 16 | Ht 71.0 in | Wt 279.0 lb

## 2013-03-24 DIAGNOSIS — D131 Benign neoplasm of stomach: Secondary | ICD-10-CM

## 2013-03-24 DIAGNOSIS — Z85038 Personal history of other malignant neoplasm of large intestine: Secondary | ICD-10-CM

## 2013-03-24 DIAGNOSIS — R933 Abnormal findings on diagnostic imaging of other parts of digestive tract: Secondary | ICD-10-CM

## 2013-03-24 DIAGNOSIS — D126 Benign neoplasm of colon, unspecified: Secondary | ICD-10-CM

## 2013-03-24 DIAGNOSIS — D1330 Benign neoplasm of unspecified part of small intestine: Secondary | ICD-10-CM

## 2013-03-24 MED ORDER — SODIUM CHLORIDE 0.9 % IV SOLN: 500.0000 mL | INTRAVENOUS | Status: DC

## 2013-03-24 NOTE — Progress Notes (Signed)
The pt tolerated the egd with bx very well. Maw

## 2013-03-24 NOTE — Patient Instructions (Addendum)
YOU HAD AN ENDOSCOPIC PROCEDURE TODAY AT [facility]: Refer to the procedure report that was given to you for any specific questions about what was found during the examination.  If the procedure report does not answer your questions, please call your gastroenterologist to clarify.  If you requested that your care partner not be given the details of your procedure findings, then the procedure report has been included in a sealed envelope for you to review at your convenience later.  YOU SHOULD EXPECT: Some feelings of bloating in the abdomen. Passage of more gas than usual.  Walking can help get rid of the air that was put into your GI tract during the procedure and reduce the bloating. If you had a lower endoscopy (such as a colonoscopy or flexible sigmoidoscopy) you may notice spotting of blood in your stool or on the toilet paper. If you underwent a bowel prep for your procedure, then you may not have a normal bowel movement for a few days.  DIET: Your first meal following the procedure should be a light meal and then it is ok to progress to your normal diet.  A half-sandwich or bowl of soup is an example of a good first meal.  Heavy or fried foods are harder to digest and may make you feel nauseous or bloated.  Likewise meals heavy in dairy and vegetables can cause extra gas to form and this can also increase the bloating.  Drink plenty of fluids but you should avoid alcoholic beverages for 24 hours.  ACTIVITY: Your care partner should take you home directly after the procedure.  You should plan to take it easy, moving slowly for the rest of the day.  You can resume normal activity the day after the procedure however you should NOT DRIVE or use heavy machinery for 24 hours (because of the sedation medicines used during the test).    SYMPTOMS TO REPORT IMMEDIATELY: A gastroenterologist can be reached at any hour.  During normal business hours, 8:30 AM to 5:00 PM Monday through Friday,  call [phone].  After hours and on weekends, please call the GI answering service at [phone]  Emergency number who will take a message and have the physician on call contact you.   Following upper endoscopy (EGD)  Vomiting of blood or coffee ground material  New chest pain or pain under the shoulder blades  Painful or persistently difficult swallowing  New shortness of breath  Fever of 100F or higher  Black, tarry-looking stools  FOLLOW UP: If any biopsies were taken you will be contacted by phone or by letter within the next 1-3 weeks.  Call your gastroenterologist if you have not heard about the biopsies in 3 weeks.  Our staff will call the home number listed on your records the next business day following your procedure to check on you and address any questions or concerns that you may have at that time regarding the information given to you following your procedure. This is a courtesy call and so if there is no answer at the home number and we have not heard from you through the emergency physician on call, we will assume that you have returned to your regular daily activities without incident.  SIGNATURES/CONFIDENTIALITY: You and/or your care partner have signed paperwork which will be entered into your electronic medical record.  These signatures attest to the fact that that the information above on your After Visit Summary has been reviewed and is understood.  Full responsibility of the confidentiality of this discharge information lies with you and/or your care-partner.   Await pathology results  Will likely need repeat endoscopy tract screening in 3 years

## 2013-03-24 NOTE — Op Note (Signed)
[facility] [address] [place], [address]   ENDOSCOPY PROCEDURE REPORT  PATIENT: Cory Harper, Cory Harper  MR#: [id number] BIRTHDATE: Sep 22, 1955 , 56  yrs. old GENDER: Male ENDOSCOPIST: Rachael Fee, MD PROCEDURE DATE:  03/24/2013 PROCEDURE:  EGD w/ biopsy ASA CLASS:     Class II INDICATIONS:  FAP, s/p total abd colectomy for innumerable polyps and also colon cancer; here for UGI tract screening. MEDICATIONS: Fentanyl 50 mcg IV, Versed 6 mg IV, and These medications were titrated to patient response per physician's verbal order TOPICAL ANESTHETIC: Cetacaine Spray  DESCRIPTION OF PROCEDURE: After the risks benefits and alternatives of the procedure were thoroughly explained, informed consent was obtained.  The LB [id number] endoscope was introduced through the mouth and advanced to the second portion of the duodenum. Without limitations.  The instrument was slowly withdrawn as the mucosa was fully examined.   Major papilla was well seen with gastroscope.  There was some slightly irregular mucosa just distal to the papilla.  This was soft, whitish, 5mm across, did not appear overtly neoplastic. Biopsies were taken.  There were several very small fundic gland appearing polyps in proximal stomch.  These were not biopsied.  The examination was otherwise normal.  Retroflexed views revealed no abnormalities.     The scope was then withdrawn from the patient and the procedure completed.  COMPLICATIONS: There were no complications. ENDOSCOPIC IMPRESSION: Major papilla was well seen with gastroscope.  There was some slightly irregular mucosa just distal to the papilla.  This was soft, whitish, 5mm across, did not appear overtly neoplastic. Biopsies were taken.  There were several very small fundic gland appearing polyps in proximal stomch.  These were not biopsied.  The examination was otherwise normal.  RECOMMENDATIONS: Await final pathology.  Likely you  will need repeat UGI tract screening for FAP related conditions in 3 years.   eSigned:  Rachael Fee, MD 03/24/2013 1:42 PM   CC: Mancel Bale, MD; Estelle Grumbles, MD

## 2013-03-24 NOTE — Progress Notes (Signed)
Patient did not experience any of the following events: a burn prior to discharge; a fall within the facility; wrong site/side/patient/procedure/implant event; or a hospital transfer or hospital admission upon discharge from the facility. (G8907)Patient did not have preoperative order for IV antibiotic SSI prophylaxis. (G8918) ewm 

## 2013-03-25 ENCOUNTER — Telehealth: Payer: Self-pay

## 2013-03-25 NOTE — Telephone Encounter (Signed)
Left message on answering machine. 

## 2013-04-07 ENCOUNTER — Encounter: Payer: Self-pay | Admitting: Gastroenterology

## 2013-04-07 ENCOUNTER — Encounter (HOSPITAL_COMMUNITY): Payer: Self-pay | Admitting: *Deleted

## 2013-04-07 ENCOUNTER — Other Ambulatory Visit: Payer: Self-pay

## 2013-04-07 DIAGNOSIS — D369 Benign neoplasm, unspecified site: Secondary | ICD-10-CM

## 2013-04-09 ENCOUNTER — Encounter (HOSPITAL_COMMUNITY): Payer: Self-pay | Admitting: Pharmacy Technician

## 2013-04-23 ENCOUNTER — Ambulatory Visit (HOSPITAL_COMMUNITY): Payer: BC Managed Care – PPO | Admitting: Anesthesiology

## 2013-04-23 ENCOUNTER — Encounter (HOSPITAL_COMMUNITY): Payer: Self-pay | Admitting: *Deleted

## 2013-04-23 ENCOUNTER — Encounter (HOSPITAL_COMMUNITY): Payer: Self-pay | Admitting: Anesthesiology

## 2013-04-23 ENCOUNTER — Encounter (HOSPITAL_COMMUNITY)
Admission: RE | Disposition: A | Discharge status: Home / Self Care | Payer: Self-pay | Source: Ambulatory Visit | Attending: Gastroenterology

## 2013-04-23 ENCOUNTER — Ambulatory Visit (HOSPITAL_COMMUNITY)
Admission: RE | Admit: 2013-04-23 | Discharge: 2013-04-23 | Disposition: A | Discharge status: Home / Self Care | Payer: BC Managed Care – PPO | Source: Ambulatory Visit | Attending: Gastroenterology | Admitting: Gastroenterology

## 2013-04-23 DIAGNOSIS — E785 Hyperlipidemia, unspecified: Secondary | ICD-10-CM | POA: Insufficient documentation

## 2013-04-23 DIAGNOSIS — I10 Essential (primary) hypertension: Secondary | ICD-10-CM | POA: Insufficient documentation

## 2013-04-23 DIAGNOSIS — D126 Benign neoplasm of colon, unspecified: Secondary | ICD-10-CM

## 2013-04-23 DIAGNOSIS — D1330 Benign neoplasm of unspecified part of small intestine: Secondary | ICD-10-CM

## 2013-04-23 DIAGNOSIS — E119 Type 2 diabetes mellitus without complications: Secondary | ICD-10-CM | POA: Insufficient documentation

## 2013-04-23 DIAGNOSIS — E039 Hypothyroidism, unspecified: Secondary | ICD-10-CM | POA: Insufficient documentation

## 2013-04-23 DIAGNOSIS — Z9049 Acquired absence of other specified parts of digestive tract: Secondary | ICD-10-CM | POA: Insufficient documentation

## 2013-04-23 DIAGNOSIS — Z859 Personal history of malignant neoplasm, unspecified: Secondary | ICD-10-CM | POA: Insufficient documentation

## 2013-04-23 DIAGNOSIS — D369 Benign neoplasm, unspecified site: Secondary | ICD-10-CM

## 2013-04-23 HISTORY — PX: ESOPHAGOGASTRODUODENOSCOPY (EGD) WITH PROPOFOL: SHX5813

## 2013-04-23 HISTORY — DX: Anemia, unspecified: D64.9

## 2013-04-23 LAB — GLUCOSE, CAPILLARY: Glucose-Capillary: 152 mg/dL — ABNORMAL HIGH (ref 70–99)

## 2013-04-23 SURGERY — ESOPHAGOGASTRODUODENOSCOPY (EGD) WITH PROPOFOL
Anesthesia: Monitor Anesthesia Care

## 2013-04-23 MED ORDER — FENTANYL CITRATE 0.05 MG/ML IJ SOLN
INTRAMUSCULAR | Status: DC | PRN
  Administered 2013-04-23: 100 ug via INTRAVENOUS

## 2013-04-23 MED ORDER — GLYCOPYRROLATE 0.2 MG/ML IJ SOLN
INTRAMUSCULAR | Status: DC | PRN
  Administered 2013-04-23: 0.2 mg via INTRAVENOUS

## 2013-04-23 MED ORDER — BUTAMBEN-TETRACAINE-BENZOCAINE 2-2-14 % EX AERO
INHALATION_SPRAY | CUTANEOUS | Status: DC | PRN
  Administered 2013-04-23: 2 via TOPICAL

## 2013-04-23 MED ORDER — LACTATED RINGERS IV SOLN
INTRAVENOUS | Status: DC
  Administered 2013-04-23: 07:00:00 via INTRAVENOUS

## 2013-04-23 MED ORDER — MIDAZOLAM HCL 5 MG/5ML IJ SOLN
INTRAMUSCULAR | Status: DC | PRN
  Administered 2013-04-23: 2 mg via INTRAVENOUS

## 2013-04-23 MED ORDER — SODIUM CHLORIDE 0.9 % IV SOLN: INTRAVENOUS | Status: DC

## 2013-04-23 MED ORDER — PROPOFOL INFUSION 10 MG/ML OPTIME
INTRAVENOUS | Status: DC | PRN
  Administered 2013-04-23: 120 ug/kg/min via INTRAVENOUS

## 2013-04-23 MED ORDER — LIDOCAINE HCL (CARDIAC) 20 MG/ML IV SOLN
INTRAVENOUS | Status: DC | PRN
  Administered 2013-04-23: 75 mg via INTRAVENOUS

## 2013-04-23 SURGICAL SUPPLY — 14 items

## 2013-04-23 NOTE — Anesthesia Postprocedure Evaluation (Signed)
  Anesthesia Post-op Note  Patient: Cory Harper  Procedure(s) Performed: Procedure(s) (LRB): ESOPHAGOGASTRODUODENOSCOPY (EGD) WITH PROPOFOL (N/A)  Patient Location: PACU  Anesthesia Type: MAC  Level of Consciousness: awake and alert   Airway and Oxygen Therapy: Patient Spontanous Breathing  Post-op Pain: mild  Post-op Assessment: Post-op Vital signs reviewed, Patient's Cardiovascular Status Stable, Respiratory Function Stable, Patent Airway and No signs of Nausea or vomiting  Last Vitals:  Filed Vitals:   04/23/13 0907  BP: 122/73  Pulse:   Temp:   Resp: 13    Post-op Vital Signs: stable   Complications: No apparent anesthesia complications

## 2013-04-23 NOTE — Anesthesia Preprocedure Evaluation (Signed)
Anesthesia Evaluation  Patient identified by MRN, date of birth, ID band Patient awake    Reviewed: Allergy & Precautions, H&P , NPO status , Patient's Chart, lab work & pertinent test results  Airway Mallampati: II TM Distance: >3 FB Neck ROM: Full    Dental no notable dental hx.    Pulmonary neg pulmonary ROS,  breath sounds clear to auscultation  Pulmonary exam normal       Cardiovascular hypertension, negative cardio ROS  Rhythm:Regular Rate:Normal  Off HTN medication since surgery.   Neuro/Psych negative neurological ROS  negative psych ROS   GI/Hepatic negative GI ROS, Neg liver ROS,   Endo/Other  diabetes, Type 2, Oral Hypoglycemic AgentsHypothyroidism   Renal/GU negative Renal ROS  negative genitourinary   Musculoskeletal negative musculoskeletal ROS (+)   Abdominal (+) + obese,   Peds negative pediatric ROS (+)  Hematology negative hematology ROS (+)   Anesthesia Other Findings   Reproductive/Obstetrics negative OB ROS                           Anesthesia Physical Anesthesia Plan  ASA: II  Anesthesia Plan: MAC   Post-op Pain Management:    Induction: Intravenous  Airway Management Planned:   Additional Equipment:   Intra-op Plan:   Post-operative Plan:   Informed Consent: I have reviewed the patients History and Physical, chart, labs and discussed the procedure including the risks, benefits and alternatives for the proposed anesthesia with the patient or authorized representative who has indicated his/her understanding and acceptance.   Dental advisory given  Plan Discussed with: CRNA  Anesthesia Plan Comments:         Anesthesia Quick Evaluation

## 2013-04-23 NOTE — H&P (Signed)
HPI: This is a man with FAP, recently found to have small adenoma in periampullary duodenum    Past Medical History  Diagnosis Date  . Diabetes mellitus   . Nasal congestion   . Colonic mass   . Hyperlipidemia   . Heart murmur   . Cancer     stg II colon cancer s/p resection  . Arthritis   . Hypertension     not on blood pressure meds since 11/13   . Hypothyroidism   . FAP (familial adenomatous polyposis)   . Anemia     Past Surgical History  Procedure Laterality Date  . Knee arthroscopy  1998    Right knee  . Upper gastrointestinal endoscopy    . Wisdom tooth extraction    . Colon resection  05/20/2012    Procedure: COLON RESECTION LAPAROSCOPIC;  Surgeon: Ardeth Sportsman, MD;  Location: [facility];  Service: General;  Laterality: N/A;  Laparoscopic Proctocolectomy, Ileal Pouch Anal Anastomoisis, Loop Ileostomy  . Bowel resection  05/20/2012  . Thyroid lobectomy  05/20/2012    Procedure: THYROID LOBECTOMY;  Surgeon: Ardeth Sportsman, MD;  Location: [facility];  Service: General;  Laterality: Left;  LEFT THYROID LOBECTOMY  . Ileostomy  05/20/12  . Flexible sigmoidoscopy  08/01/2012    Procedure: FLEXIBLE SIGMOIDOSCOPY;  Surgeon: [name], MD;  Location: [facility];  Service: Endoscopy;  Laterality: N/A;  use endoscope  . Ileostomy closure N/A 09/18/2012    Procedure: Loop Ileostomy Takedown with EUA ;  Surgeon: Ardeth Sportsman, MD;  Location: [facility];  Service: General;  Laterality: N/A;  loop ileostomy takedown with EUA     Current Facility-Administered Medications  Medication Dose Route Frequency Provider Last Rate Last Dose  . 0.9 %  sodium chloride infusion   Intravenous Continuous Rachael Fee, MD      . lactated ringers infusion   Intravenous Continuous Rachael Fee, MD 20 mL/hr at 04/23/13 0102      Allergies as of 04/07/2013 - Review Complete 03/24/2013  Allergen Reaction Noted  . Ibuprofen  05/22/2006    Family History  Problem Relation Age of Onset  .  Uterine cancer Mother   . Hypertension Mother   . Stroke Mother   . Colon cancer Mother   . Colon polyps Mother   . Cancer Mother     colon, endometrial  . Diabetes Father   . Obesity Father   . Pneumonia Father   . Cancer Father     skin - squamous   . Diabetes Paternal Grandmother   . Prostate cancer Neg Hx     History   Social History  . Marital Status: Married    Spouse Name: June    Number of Children: 0  . Years of Education: N/A   Occupational History  . 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 Smoking status: Former Smoker    Quit date: 08/07/1991  . Smokeless tobacco: Never Used     Comment: quit in 1993  . Alcohol Use: No  . Drug Use: No  . Sexual Activity: Not on file   Other Topics Concern  . 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 male with FAP, periampullary polyp  EGD with polyp resection

## 2013-04-23 NOTE — Transfer of Care (Signed)
Immediate Anesthesia Transfer of Care Note  Patient: Cory Harper  Procedure(s) Performed: Procedure(s) with comments: ESOPHAGOGASTRODUODENOSCOPY (EGD) WITH PROPOFOL (N/A) - side viewing scope  Patient Location: PACU  Anesthesia Type:MAC  Level of Consciousness: awake, alert , oriented and patient cooperative  Airway & Oxygen Therapy: Patient Spontanous Breathing and Patient connected to nasal cannula oxygen  Post-op Assessment: Report given to PACU RN, Post -op Vital signs reviewed and stable and Patient moving all extremities X 4  Post vital signs: stable  Complications: No apparent anesthesia complications

## 2013-04-23 NOTE — Op Note (Signed)
[facility] [address] [place], [address]   ENDOSCOPY PROCEDURE REPORT  PATIENT: Cory, Harper  MR#: [id number] BIRTHDATE: 1956-02-12 , 56  yrs. old GENDER: Male ENDOSCOPIST: Rachael Fee, MD PROCEDURE DATE:  04/23/2013 PROCEDURE:  EGD w/ biopsy ASA CLASS:     Class III INDICATIONS:  known FAP, s/p total abd colectomy; recent EGD found periampullary small adenoma. MEDICATIONS: MAC sedation, administered by CRNA TOPICAL ANESTHETIC: none  DESCRIPTION OF PROCEDURE: After the risks benefits and alternatives of the procedure were thoroughly explained, informed consent was obtained.  The endoscope [id number] endoscope was introduced through the mouth and advanced to the second portion of the duodenum. Without limitations.  The instrument was slowly withdrawn as the mucosa was fully examined.    There was the small, small amount of whitish mucosa just distal to the major papilla.  I was planning on removing this with snare polypectomy however I noticed a different, somewhat more concerning nodule in second duodenum along the wall opposite the major papilla.  This was nodular, approximately 1.5cm across.  I biopsied it extensively.  The remainder of the exam was normal (fundic appearing polyps in stomach).  Retroflexed views revealed no abnormalities.     The scope was then withdrawn from the patient and the procedure completed.  COMPLICATIONS: There were no complications. ENDOSCOPIC IMPRESSION: There was the small, small amount of whitish, adenomatous mucosa just distal to the major papilla.  I was planning on removing this with snare polypectomy however I noticed a different, somewhat more concerning nodule in second duodenum along the wall opposite the major papilla.  This was nodular, approximately 1.5cm across.  I biopsied it extensively.  The remainder of the exam was normal (fundic appearing polyps in stomach).  RECOMMENDATIONS: If this newly  discovered duodenal nodule is adenomatous, I will refer him to tertiary Gastroenterology to consider endoscopic resection.  The margins were unclear and at it's size, location I am not comfortable attempting resection.   eSigned:  Rachael Fee, MD 04/23/2013 8:39 AM

## 2013-04-24 ENCOUNTER — Encounter (HOSPITAL_COMMUNITY): Payer: Self-pay | Admitting: Gastroenterology

## 2013-04-27 ENCOUNTER — Other Ambulatory Visit (INDEPENDENT_AMBULATORY_CARE_PROVIDER_SITE_OTHER): Payer: BC Managed Care – PPO

## 2013-04-27 DIAGNOSIS — E119 Type 2 diabetes mellitus without complications: Secondary | ICD-10-CM

## 2013-04-27 LAB — LDL CHOLESTEROL, DIRECT: Direct LDL: 49.4 mg/dL

## 2013-04-27 LAB — LIPID PANEL
Cholesterol: 109 mg/dL (ref 0–200)
HDL: 36.1 mg/dL — ABNORMAL LOW (ref >39.00)

## 2013-04-27 LAB — MICROALBUMIN / CREATININE URINE RATIO
Creatinine,U: 118 mg/dL
Microalb, Ur: 0.8 mg/dL (ref 0.0–1.9)

## 2013-04-29 ENCOUNTER — Encounter: Payer: Self-pay | Admitting: Gastroenterology

## 2013-04-29 ENCOUNTER — Other Ambulatory Visit: Payer: Self-pay

## 2013-04-29 DIAGNOSIS — Z86010 Personal history of colonic polyps: Secondary | ICD-10-CM

## 2013-05-04 ENCOUNTER — Ambulatory Visit (INDEPENDENT_AMBULATORY_CARE_PROVIDER_SITE_OTHER): Payer: BC Managed Care – PPO | Admitting: Family Medicine

## 2013-05-04 ENCOUNTER — Encounter: Payer: Self-pay | Admitting: Family Medicine

## 2013-05-04 VITALS — BP 122/70 | HR 69 | Temp 98.2°F | Wt 278.5 lb

## 2013-05-04 DIAGNOSIS — Z23 Encounter for immunization: Secondary | ICD-10-CM

## 2013-05-04 DIAGNOSIS — E119 Type 2 diabetes mellitus without complications: Secondary | ICD-10-CM

## 2013-05-04 NOTE — Patient Instructions (Addendum)
Recheck your A1c before a visit in about 3 months.  Start back exercising.  Glad to see you.

## 2013-05-04 NOTE — Progress Notes (Signed)
Diabetes:  Using medications without difficulties:yes Hypoglycemic episodes:no Hyperglycemic episodes:no Feet problems:no Blood Sugars averaging: 115-130 eye exam within last year: due, encouraged Weight is back up as he has recovered.  He hasn't been walking as much.  Diet is still good. Discussed.  A1c 8.5.  He is motivated to get this A1c down w/o extra meds.   GI much improved, now with consistent BMs- ~6 per day.  No blood in stool.  Had [facility] f/u pending.    PMH and SH reviewed  Meds, vitals, and allergies reviewed.   ROS: See HPI.  Otherwise negative.    GEN: nad, alert and oriented HEENT: mucous membranes moist NECK: supple w/o LA CV: rrr. PULM: ctab, no inc wob ABD: soft, +bs EXT: no edema SKIN: no acute rash  Diabetic foot exam: Normal inspection No skin breakdown No calluses  Normal DP pulses Normal sensation to light touch and monofilament Nails normal

## 2013-05-05 NOTE — Assessment & Plan Note (Signed)
Labs discussed, he'll work hard on diet and exercise and we'll recheck in about 3 months.  He knows what he needs to do and plans to get this done.

## 2013-05-22 ENCOUNTER — Telehealth: Payer: Self-pay | Admitting: Gastroenterology

## 2013-05-22 NOTE — Telephone Encounter (Signed)
Rec'd [facility] forward 6 pages to Dr. Christella Hartigan

## 2013-05-26 ENCOUNTER — Telehealth: Payer: Self-pay | Admitting: Family Medicine

## 2013-05-26 LAB — HM DIABETES EYE EXAM

## 2013-05-26 NOTE — Telephone Encounter (Signed)
Left detailed message on voicemail. Rx ppw at front desk for pick up.

## 2013-05-26 NOTE — Telephone Encounter (Signed)
It'll be ready tomorrow.

## 2013-05-26 NOTE — Telephone Encounter (Signed)
Patient called to find out if the [facility] form he dropped off is ready to be picked up.  He asked to be called as soon as it's ready.

## 2013-06-01 ENCOUNTER — Encounter: Payer: Self-pay | Admitting: Family Medicine

## 2013-06-09 ENCOUNTER — Telehealth: Payer: Self-pay | Admitting: Gastroenterology

## 2013-06-09 NOTE — Telephone Encounter (Signed)
Recd records from [facility] [place] 5pgs to [employer]

## 2013-06-18 ENCOUNTER — Encounter (INDEPENDENT_AMBULATORY_CARE_PROVIDER_SITE_OTHER): Payer: Self-pay

## 2013-06-18 ENCOUNTER — Other Ambulatory Visit (HOSPITAL_BASED_OUTPATIENT_CLINIC_OR_DEPARTMENT_OTHER): Payer: BC Managed Care – PPO | Admitting: Lab

## 2013-06-18 ENCOUNTER — Ambulatory Visit (HOSPITAL_BASED_OUTPATIENT_CLINIC_OR_DEPARTMENT_OTHER): Payer: BC Managed Care – PPO | Admitting: Oncology

## 2013-06-18 ENCOUNTER — Telehealth: Payer: Self-pay | Admitting: Oncology

## 2013-06-18 VITALS — BP 136/77 | HR 76 | Temp 98.0°F | Resp 20 | Ht 71.0 in | Wt 272.9 lb

## 2013-06-18 DIAGNOSIS — E119 Type 2 diabetes mellitus without complications: Secondary | ICD-10-CM

## 2013-06-18 DIAGNOSIS — C73 Malignant neoplasm of thyroid gland: Secondary | ICD-10-CM

## 2013-06-18 DIAGNOSIS — C180 Malignant neoplasm of cecum: Secondary | ICD-10-CM

## 2013-06-18 LAB — CEA: CEA: <0.5 ng/mL (ref 0.0–5.0)

## 2013-06-18 NOTE — Telephone Encounter (Signed)
gv pt appt schedule for May 2015.  °

## 2013-06-18 NOTE — Progress Notes (Signed)
   [facility]    OFFICE PROGRESS NOTE   INTERVAL HISTORY:   [name] returns for scheduled followup of colon cancer. He feels well. No specific complaint. He has approximately 6 bowel movements per day. He reports undergoing removal of a duodenal polyp at [facility] last week.  Objective:  Vital signs in last 24 hours:  Blood pressure 136/77, pulse 76, temperature 98 F (36.7 C), temperature source Oral, resp. rate 20, height 5\' 11"  (1.803 m), weight 272 lb 14.4 oz (123.787 kg), SpO2 100.00%.    HEENT: Neck without mass Lymphatics: No cervical, supra-auricular, axillary, or inguinal nodes. Prominent bilateral axillary fat pads. Resp: Lungs clear bilaterally Cardio: Regular rate and rhythm GI: No hepatosplenomegaly, nontender, no mass Vascular: No leg edema   Lab Results:  [name] pending   Medications: I have reviewed the patient's current medications.  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 Ileostomy takedown 09/18/2012  2. Familial polyposis with multiple adenomatous polyps noted on the colectomy specimen 05/20/2012 -followed by Dr. Christella Hartigan for surveillance endoscopies  3. 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 Diabetes  5.status post endoscopic removal of a duodenal polyp at [facility] on 06/08/2013    Disposition:  He remains in clinical remission from colon cancer. He will return for an office visit and [name] in 6 months. He will continue surveillance endoscopies with Dr. Christella Hartigan and Dr. Michaell Cowing.  The hemoglobin was low (9.3) when checked in February of this year. We will be sure he has a repeat CBC.   Thornton Papas, MD  06/18/2013  11:32 AM

## 2013-06-18 NOTE — Progress Notes (Signed)
Faxed copy of today's office note to his PCP, Dr. Crawford Givens with note to recheck CBC in next 1 month at request of Dr. Truett Perna.

## 2013-06-19 ENCOUNTER — Telehealth: Payer: Self-pay | Admitting: *Deleted

## 2013-06-19 NOTE — Telephone Encounter (Signed)
Patient notified

## 2013-06-19 NOTE — Telephone Encounter (Signed)
Message copied by Wandalee Ferdinand on Fri Jun 19, 2013  4:59 PM ------      Message from: Cory Harper      Created: Thu Jun 18, 2013  8:56 PM       Please call patient, cea is normal ------

## 2013-06-21 ENCOUNTER — Other Ambulatory Visit: Payer: Self-pay | Admitting: Family Medicine

## 2013-06-21 DIAGNOSIS — E119 Type 2 diabetes mellitus without complications: Secondary | ICD-10-CM

## 2013-06-21 DIAGNOSIS — D649 Anemia, unspecified: Secondary | ICD-10-CM

## 2013-06-26 ENCOUNTER — Encounter: Payer: Self-pay | Admitting: Gastroenterology

## 2013-07-20 ENCOUNTER — Other Ambulatory Visit: Payer: Self-pay | Admitting: Family Medicine

## 2013-08-03 ENCOUNTER — Ambulatory Visit (INDEPENDENT_AMBULATORY_CARE_PROVIDER_SITE_OTHER): Payer: BC Managed Care – PPO | Admitting: Surgery

## 2013-08-03 ENCOUNTER — Encounter (INDEPENDENT_AMBULATORY_CARE_PROVIDER_SITE_OTHER): Payer: Self-pay | Admitting: Surgery

## 2013-08-03 VITALS — BP 132/84 | HR 76 | Temp 98.6°F | Resp 18 | Ht 71.0 in | Wt 274.0 lb

## 2013-08-03 DIAGNOSIS — K620 Anal polyp: Secondary | ICD-10-CM

## 2013-08-03 DIAGNOSIS — D1330 Benign neoplasm of unspecified part of small intestine: Secondary | ICD-10-CM

## 2013-08-03 DIAGNOSIS — C180 Malignant neoplasm of cecum: Secondary | ICD-10-CM

## 2013-08-03 DIAGNOSIS — D126 Benign neoplasm of colon, unspecified: Secondary | ICD-10-CM

## 2013-08-03 NOTE — Patient Instructions (Signed)
GETTING TO GOOD BOWEL HEALTH. Irregular bowel habits such as constipation and diarrhea can lead to many problems over time.  Having one soft bowel movement a day is the most important way to prevent further problems.  The anorectal canal is designed to handle stretching and feces to safely manage our ability to get rid of solid waste (feces, poop, stool) out of our body.  BUT, hard constipated stools can act like ripping concrete bricks and diarrhea can be a burning fire to this very sensitive area of our body, causing inflamed hemorrhoids, anal fissures, increasing risk is perirectal abscesses, abdominal pain/bloating, an making irritable bowel worse.     The goal: ONE SOFT BOWEL MOVEMENT A DAY!  To have soft, regular bowel movements:    Drink at least 8 tall glasses of water a day.     Take plenty of fiber.  Fiber is the undigested part of plant food that passes into the colon, acting s "natures broom" to encourage bowel motility and movement.  Fiber can absorb and hold large amounts of water. This results in a larger, bulkier stool, which is soft and easier to pass. Work gradually over several weeks up to 6 servings a day of fiber (25g a day even more if needed) in the form of: o Vegetables -- Root (potatoes, carrots, turnips), leafy green (lettuce, salad greens, celery, spinach), or cooked high residue (cabbage, broccoli, etc) o Fruit -- Fresh (unpeeled skin & pulp), Dried (prunes, apricots, cherries, etc ),  or stewed ( applesauce)  o Whole grain breads, pasta, etc (whole wheat)  o Bran cereals    Bulking Agents -- This type of water-retaining fiber generally is easily obtained each day by one of the following:  o Psyllium bran -- The psyllium plant is remarkable because its ground seeds can retain so much water. This product is available as Metamucil, Konsyl, Effersyllium, Per Diem Fiber, or the less expensive generic preparation in drug and health food stores. Although labeled a laxative, it really  is not a laxative.  o Methylcellulose -- This is another fiber derived from wood which also retains water. It is available as Citrucel. o Polyethylene Glycol - and "artificial" fiber commonly called Miralax or Glycolax.  It is helpful for people with gassy or bloated feelings with regular fiber o Flax Seed - a less gassy fiber than psyllium   No reading or other relaxing activity while on the toilet. If bowel movements take longer than 5 minutes, you are too constipated   AVOID CONSTIPATION.  High fiber and water intake usually takes care of this.  Sometimes a laxative is needed to stimulate more frequent bowel movements, but    Laxatives are not a good long-term solution as it can wear the colon out. o Osmotics (Milk of Magnesia, Fleets phosphosoda, Magnesium citrate, MiraLax, GoLytely) are safer than  o Stimulants (Senokot, Castor Oil, Dulcolax, Ex Lax)    o Do not take laxatives for more than 7days in a row.    IF SEVERELY CONSTIPATED, try a Bowel Retraining Program: o Do not use laxatives.  o Eat a diet high in roughage, such as bran cereals and leafy vegetables.  o Drink six (6) ounces of prune or apricot juice each morning.  o Eat two (2) large servings of stewed fruit each day.  o Take one (1) heaping tablespoon of a psyllium-based bulking agent twice a day. Use sugar-free sweetener when possible to avoid excessive calories.  o Eat a normal breakfast.  o   Set aside 15 minutes after breakfast to sit on the toilet, but do not strain to have a bowel movement.  o If you do not have a bowel movement by the third day, use an enema and repeat the above steps.    Controlling diarrhea o Switch to liquids and simpler foods for a few days to avoid stressing your intestines further. o Avoid dairy products (especially milk & ice cream) for a short time.  The intestines often can lose the ability to digest lactose when stressed. o Avoid foods that cause gassiness or bloating.  Typical foods include  beans and other legumes, cabbage, broccoli, and dairy foods.  Every person has some sensitivity to other foods, so listen to our body and avoid those foods that trigger problems for you. o Adding fiber (Citrucel, Metamucil, psyllium, Miralax) gradually can help thicken stools by absorbing excess fluid and retrain the intestines to act more normally.  Slowly increase the dose over a few weeks.  Too much fiber too soon can backfire and cause cramping & bloating. o Probiotics (such as active yogurt, Align, etc) may help repopulate the intestines and colon with normal bacteria and calm down a sensitive digestive tract.  Most studies show it to be of mild help, though, and such products can be costly. o Medicines:   Bismuth subsalicylate (ex. Kayopectate, Pepto Bismol) every 30 minutes for up to 6 doses can help control diarrhea.  Avoid if pregnant.   Loperamide (Immodium) can slow down diarrhea.  Start with two tablets (4mg  total) first and then try one tablet every 6 hours.  Avoid if you are having fevers or severe pain.  If you are not better or start feeling worse, stop all medicines and call your doctor for advice o Call your doctor if you are getting worse or not better.  Sometimes further testing (cultures, endoscopy, X-ray studies, bloodwork, etc) may be needed to help diagnose and treat the cause of the diarrhea.  Ileal Pouch-Anal Anastomosis (IPAA) Care After Refer to this sheet in the next few weeks. These instructions provide you with information on caring for yourself after your procedure. Your caregiver may also give you more specific instructions. Your treatment has been planned according to current medical practices, but problems sometimes occur. Call your caregiver if you have any problems or questions after your procedure. The ileal pouch-anal anastomosis (IPAA) procedure creates a reservoir for stool after the colon and rectum have been removed (colectomy) due to disease. The operation is a  1 to 3 stage procedure. In the initial stage or stages, a J-pouch is created using a portion of the small intestine (ileum) in a "J" shape and is connected to the end of the rectum (anus). A temporary outside belly (abdominal) opening for removing waste (ileostomy) may be created. In the final stage, the ileostomy is reversed. After this, waste can move from the small intestine, through the new J-pouch, and out through the anus. Recovery from the final stage of the surgery and adapting to the new J-pouch can take several months or up to 1 year. HOME CARE INSTRUCTIONS  Everyone recovers at a different pace. Proper self-care will help you recover fully and return to your normal activities. Frequent stools are normal upon discharge following both stages of the procedure. This will taper off over time to about 4 to 6 bowel movements per day. This happens as the pouch stretches out and starts to hold more stool. Follow your caregiver's specific instructions as well as  these steps. After the initial stage of surgery:  Take it easy. Rest often. Give yourself time to adjust to having the temporary opening (stoma) in the abdomen. Give your incisions and new pouch time to heal.  Follow your caregiver's instructions for monitoring your stoma, proper skin care, odor control, and changing the stoma device.  Follow your caregiver's dietary instructions. Your caregiver will help you to understand what foods may cause blockage, increase bowel movements, slow bowel movements, and cause skin irritation or gas. Remember these tips:  Chew your foods thoroughly.  Gradually introduce new foods one at a time.  Avoid drinking lots of fluids with your meals.  Drink plenty of fluids between meals. Drink enough water and fluids to keep your urine clear or pale yellow.  Do not eat high-fiber foods on an empty stomach.  Eat at regular intervals. Do not skip meals to slow your output.  Take pain or other medicines as  directed.  Record your diet and bowel movements in a chart as directed.  Continue with Kegel exercises as directed.  Follow up with your caregiver as directed. After the final stage of surgery:  Take it easy. Rest often to give your cut (incision) and repairs time to heal.  Take pain or other medicines as directed.  Follow a low-fiber diet for 2 to 3 weeks, or as directed. Then, gradually return to a normal diet.  Eat meals on a regular schedule and chew thoroughly.  Avoid spicy and gassy foods in the beginning.  Regular amounts of sodium are okay.  Increase your potassium through foods such as meat, fish, bananas, and sweet potatoes to prevent or treat diarrhea.  Avoid an excess of foods high in sugar.  Take bulking agents or antidiarrheal medicines as directed. These will help to firm up and reduce your bowel movements. You will learn how to control your bowel movements over time with medicine and diet.  Drink enough water and fluids to keep your urine clear or pale yellow.  Some nighttime leakage is normal at first. This will improve over time. Wear a pad at night until this problem goes away.  Continue proper skin care around the anus.  Use only soft cotton balls with warm water to wipe the anal area.  Change pads or panty liners frequently.  Wear cotton undergarments.  Follow your caregiver's instructions for skin barriers and soothing baths sitting in warm water (sitz).  Avoid spicy foods, coffee, and tea if you experience irritation.  Record your bowel movements in a chart as directed.  Continue with Kegel exercises as directed.  Follow up with your caregiver as directed. Other things to keep in mind (if they apply):  You may gradually resume most activities, including sexual activity, as directed. Ask your caregiver about birth control, because medicine may not be absorbed normally.  Menstrual cycles are commonly disrupted after surgery. You may be off  schedule for up to 1 year.  Always discuss your medicines with your caregiver before taking them.  Do not take laxatives.  Wear a special medical alert bracelet to alert caregivers that you have had colectomy and pouch procedures. SEEK MEDICAL CARE IF:   You are having trouble caring for your stoma or using the ostomy supplies (changing the pouch).  You have an oral temperature above 102 F (38.9 C).  You develop chills.  You are feeling sick to your stomach (nauseous).  You are throwing up (vomiting).  You notice bleeding, skin irritation, drainage, redness, or  pain around the anus or stoma.  You notice a change in the size or appearance of the stoma.  You have abdominal pain, bloating, pressure, or cramping.  Your stools do not become firmer.  Your stool frequency is not as expected by you and your caregiver (too high or too low).  You have frequent diarrhea.  You experience sexual dysfunction.  You have other new symptoms.  You have questions or concerns. SEEK IMMEDIATE MEDICAL CARE IF:   You feel dizzy, lightheaded, or faint.  You measure pouch drainage of more than 1,500 ml per day. This amount of drainage can lead to dehydration.  You have abdominal pain that does not go away or becomes severe.  You have an oral temperature above 102 F (38.9 C), not controlled by medicine.  You have repeated vomiting.  Stool is not draining through the stoma or being removed through the anus.  You have an irregular heartbeat or chest pain. MAKE SURE YOU:   Understand these instructions.  Will watch your condition.  Will get help right away if you are not doing well or get worse. FOR MORE INFORMATION: The J-Pouch Group: www.j-pouch.org. [employer] (UOAA): www.ostomy.org. Document Released: 10/17/2009 Document Revised: 11/17/2012 Document Reviewed: 10/17/2009 [facility] Patient Information 2014 [place], [place].

## 2013-08-03 NOTE — Progress Notes (Signed)
Subjective:     Patient ID: Cory Harper, male   DOB: [birth year], 57 y.o.   MRN: [id number]  HPI   LEONTE HORRIGAN  1956/02/11 [id number]  Patient Care Team: [name], MD as PCP - General (Family Medicine) Rachael Fee, MD as Consulting Physician (Gastroenterology) [name], MD as Consulting Physician (General Surgery)  This patient is a 57 y.o.male who presents today for surgical evaluation s/p:  POST-OPERATIVE DIAGNOSIS: loop ileostomy status post proctocolectomy IPAA J-pouch 05/20/2012   PROCEDURE: Procedure(s):  Loop Ileostomy Takedown  EUA   Patient is one month status post ileostomy takedown.  He feels like he is doing well.  6 bowel movements a day.   Drinking liquids.  Not dehydrated.  Walking well.  Back to work.  Regained some weight.  Trying to get it back down.  No fevers chills or sweats.  He comes today with his wife.  No cramping or rectal bleeding.  No Incontinence to flatus nor stool.  No "accidents"  No urgency.  No difficulty with urination.   He is staying busy with work, he is in good spirits.  He and his wife helped another patient that was struggling with the loop ileostomy pouch - it was very satisfied for them to pay it forward  He saw medical oncology.  Last two [name] were normal.  <0.5 on 06/18/2013  Patient Active Problem List   Diagnosis Date Noted  . Familial adenomatous polyposis coli, s/p proctocolectomy/J pouch 05/20/2012 02/27/2007    Priority: High  . Cecal cancer, pT3pN0pM0 s/p abdominal colectomy 2013 05/20/2012    Priority: Medium  . Anal polyp s/p polypectomy 05/20/2012    Priority: Medium  . Benign neoplasm of duodenum, jejunum, and ileum 04/23/2013  . Hypothyroidism 06/25/2012  . 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 Pure hypercholesterolemia 01/30/2012  . VITAMIN B12 DEFICIENCY 09/27/2010  . UNSPECIFIED VITAMIN D DEFICIENCY 09/27/2010  . HYPERTENSION, BENIGN ESSENTIAL  08/04/2007  . DIABETES MELLITUS, TYPE II 02/27/2007  . Obesity (BMI 30-39.9) 02/27/2007  . ECZEMA 02/27/2007    Past Medical History  Diagnosis Date  . Diabetes mellitus   . Nasal congestion   . Colonic mass   . Hyperlipidemia   . Heart murmur   . Cancer     stg II colon cancer s/p resection  . Arthritis   . Hypertension     not on blood pressure meds since 11/13   . Hypothyroidism   . FAP (familial adenomatous polyposis)   . Anemia     Past Surgical History  Procedure Laterality Date  . Knee arthroscopy  1998    Right knee  . Upper gastrointestinal endoscopy    . Wisdom tooth extraction    . Colon resection  05/20/2012    Procedure: COLON RESECTION LAPAROSCOPIC;  Surgeon: Ardeth Sportsman, MD;  Location: [facility];  Service: General;  Laterality: N/A;  Laparoscopic Proctocolectomy, Ileal Pouch Anal Anastomoisis, Loop Ileostomy  . Bowel resection  05/20/2012  . Thyroid lobectomy  05/20/2012    Procedure: THYROID LOBECTOMY;  Surgeon: Ardeth Sportsman, MD;  Location: [facility];  Service: General;  Laterality: Left;  LEFT THYROID LOBECTOMY  . Ileostomy  05/20/12  . Flexible sigmoidoscopy  08/01/2012    Procedure: FLEXIBLE SIGMOIDOSCOPY;  Surgeon: [name], MD;  Location: [facility];  Service: Endoscopy;  Laterality: N/A;  use endoscope  . Ileostomy closure N/A 09/18/2012    Procedure: Loop Ileostomy  Takedown with EUA ;  Surgeon: Ardeth Sportsman, MD;  Location: [facility];  Service: General;  Laterality: N/A;  loop ileostomy takedown with EUA   . Esophagogastroduodenoscopy (egd) with propofol N/A 04/23/2013    Procedure: ESOPHAGOGASTRODUODENOSCOPY (EGD) WITH PROPOFOL;  Surgeon: Rachael Fee, MD;  Location: [facility];  Service: Endoscopy;  Laterality: N/A;  side viewing scope    History   Social History  . Marital Status: Married    Spouse Name: June    Number of Children: 0  . Years of Education: N/A   Occupational History  . 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 Smoking status: Former Smoker    Quit date: 08/07/1991  . Smokeless tobacco: Never Used     Comment: quit in 1993  . Alcohol Use: No  . Drug Use: No  . Sexual Activity: Not on file   Other Topics Concern  . 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 Uterine cancer Mother   . Hypertension Mother   . Stroke Mother   . Colon cancer Mother   . Colon polyps Mother   . Cancer Mother     colon, endometrial  . Diabetes Father   . Obesity Father   . Pneumonia Father   . Cancer Father     skin - squamous   . Diabetes Paternal Grandmother   . Prostate cancer Neg Hx     Current Outpatient Prescriptions  Medication Sig Dispense Refill  . aspirin 81 MG tablet Take 81 mg by mouth daily.      . cholecalciferol (VITAMIN D) 1000 UNITS tablet Take 1,000 Units by mouth daily.       . ferrous fumarate (HEMOCYTE - 106 MG FE) 325 (106 FE) MG TABS tablet Take 1 tablet by mouth 2 (two) times daily.      . fluticasone (FLONASE) 50 MCG/ACT nasal spray Place 2 sprays into the nose daily.      Marland Kitchen glimepiride (AMARYL) 2 MG tablet Take 2 mg by mouth 2 (two) times daily.       Marland Kitchen levothyroxine (SYNTHROID, LEVOTHROID) 100 MCG tablet Take 100 mcg by mouth daily before breakfast.      . loperamide (IMODIUM A-D) 2 MG tablet Take 2-4 mg by mouth 2 (two) times daily as needed for diarrhea or loose stools (Goal is 4-6 BMs / day). For diarrhea      . loratadine (CLARITIN) 10 MG tablet Take 10 mg by mouth daily.       . metFORMIN (GLUCOPHAGE) 1000 MG tablet Take 1,000 mg by mouth 2 (two) times daily with a meal.      . ONGLYZA 5 MG TABS tablet take 1 tablet by mouth daily WITH SUPPER  30 tablet  1  . rosuvastatin (CRESTOR) 10 MG tablet Take 10 mg by mouth at bedtime.       . saxagliptin HCl (ONGLYZA) 5 MG TABS tablet Take 5 mg by mouth daily.      . vitamin B-12 (CYANOCOBALAMIN) 500 MCG tablet Take 500 mcg by mouth daily.      . pantoprazole (PROTONIX) 40 MG tablet Take 40 mg by mouth daily.  No current facility-administered medications for this visit.     Allergies  Allergen Reactions  . Ibuprofen Swelling    Made his joints swell Joint swelling    BP 132/84  Pulse 76  Temp(Src) 98.6 F (37 C)  Resp 18  Ht 5\' 11"  (1.803 m)  Wt 274 lb (124.286 kg)  BMI 38.23 kg/m2  No results found.   Review of Systems  Constitutional: Negative for fever, chills and diaphoresis.  HENT: Negative for sore throat and trouble swallowing.   Eyes: Negative for photophobia and visual disturbance.  Respiratory: Negative for choking and shortness of breath.   Cardiovascular: Negative for chest pain and palpitations.  Gastrointestinal: Positive for diarrhea. Negative for nausea, vomiting, abdominal pain, constipation, blood in stool, abdominal distention, anal bleeding and rectal pain.  Genitourinary: Negative for dysuria, urgency, difficulty urinating and testicular pain.  Musculoskeletal: Negative for arthralgias, gait problem, myalgias and neck pain.  Skin: Negative for color change and rash.  Neurological: Negative for dizziness, speech difficulty, weakness and numbness.  Hematological: Negative for adenopathy.  Psychiatric/Behavioral: Negative for hallucinations, confusion and agitation.       Objective:   Physical Exam  Constitutional: He is oriented to person, place, and time. He appears well-developed and well-nourished. No distress.  HENT:  Head: Normocephalic.  Mouth/Throat: Oropharynx is clear and moist. No oropharyngeal exudate.  Eyes: Conjunctivae and EOM are normal. Pupils are equal, round, and reactive to light. No scleral icterus.  Neck: Normal range of motion. No tracheal deviation present.  Cardiovascular: Normal rate, normal heart  sounds and intact distal pulses.   Pulmonary/Chest: Effort normal. No respiratory distress.  Abdominal: Soft. He exhibits no distension. There is no tenderness. Hernia confirmed negative in the right inguinal area and confirmed negative in the left inguinal area.    Incisions clean with normal healing ridges.  No hernias  Genitourinary:  Exam done with assistance of male Medical Assistant in the room.  Perianal skin clean with good hygiene.  Some mild posterior chronic thickening intergluteal but no pruritis - stable.  No external skin tags / hemorrhoids of significance.  No pilonidal disease.  No fissure.  No abscess/fistula.    Tolerates digital and anoscopic rectal exam.  Normal sphincter tone.  Anastomosis 2cm from anal verge - smooth.  No distal pouch masses.      Musculoskeletal: Normal range of motion. He exhibits no tenderness.  Neurological: He is alert and oriented to person, place, and time. No cranial nerve deficit. He exhibits normal muscle tone. Coordination normal.  Skin: Skin is warm and dry. No rash noted. He is not diaphoretic.  Psychiatric: He has a normal mood and affect. His behavior is normal.       Assessment:     No evidence of polyps or other pathology perianal in patient with familial polyposis & right colon cancer s/p Abdomino-proctocolectomy & IPAA J pouch.   Need for followup on upper GI screening.     Plan:     I am happy at how well he has done so far.  I noted a 6 daily bowel movements with a Jpouch is usual baseline for the rest of his life.  He is tolerating that range already.    Discussed warning signs of pouchitis such as rectal bleeding and urgency.  He was instructed to call us if he ever develops issues with that.  Dr. Maisie Fus is happy to help out to manage that should it come to that.  Followup with gastroenterology for  proximal digestive tract screening.  Alternate between locally with Dr. Christella Hartigan vs. [facility]  Patient will  require anoscopic / DRE screening of the coloanal anastomosis to make sure there is no evidence of any recurrence of polyps.  Followup annually.  I will check with my coworker partner to see if scoping the pouch is strongly needed or just on a when necessary basis  Increase activity as tolerated to regular activity.  Do not push through pain.  Continue exercising regularly to continue to have a good healthy weight loss.    Instructions discussed.  Followup with primary care physician for other health issues as would normally be done.  Questions answered.  The patient expressed understanding and appreciation

## 2013-08-10 ENCOUNTER — Other Ambulatory Visit: Payer: BC Managed Care – PPO

## 2013-08-17 ENCOUNTER — Other Ambulatory Visit (INDEPENDENT_AMBULATORY_CARE_PROVIDER_SITE_OTHER): Payer: Self-pay | Admitting: General Surgery

## 2013-08-17 ENCOUNTER — Ambulatory Visit: Payer: BC Managed Care – PPO | Admitting: Family Medicine

## 2013-08-17 ENCOUNTER — Telehealth (INDEPENDENT_AMBULATORY_CARE_PROVIDER_SITE_OTHER): Payer: Self-pay | Admitting: General Surgery

## 2013-08-17 NOTE — Telephone Encounter (Signed)
Discussed pouchoscopy with pt.  He is agreeable to doing this in April.

## 2013-08-18 ENCOUNTER — Telehealth (INDEPENDENT_AMBULATORY_CARE_PROVIDER_SITE_OTHER): Payer: Self-pay | Admitting: Surgery

## 2013-08-18 NOTE — Telephone Encounter (Signed)
Pt states he spoke to you and its fine to wait until April 2015 for his procedure.  Will place orders on hold

## 2013-08-20 ENCOUNTER — Other Ambulatory Visit: Payer: Self-pay | Admitting: Family Medicine

## 2013-09-03 ENCOUNTER — Ambulatory Visit (HOSPITAL_COMMUNITY): Admit: 2013-09-03 | Payer: Self-pay | Admitting: General Surgery

## 2013-09-03 ENCOUNTER — Encounter (HOSPITAL_COMMUNITY): Payer: Self-pay

## 2013-09-03 SURGERY — SIGMOIDOSCOPY, FLEXIBLE
Anesthesia: Moderate Sedation

## 2013-09-04 ENCOUNTER — Other Ambulatory Visit (INDEPENDENT_AMBULATORY_CARE_PROVIDER_SITE_OTHER): Payer: BC Managed Care – PPO

## 2013-09-04 DIAGNOSIS — D649 Anemia, unspecified: Secondary | ICD-10-CM

## 2013-09-04 DIAGNOSIS — E119 Type 2 diabetes mellitus without complications: Secondary | ICD-10-CM

## 2013-09-04 LAB — CBC WITH DIFFERENTIAL/PLATELET
BASOS PCT: 0.7 % (ref 0.0–3.0)
Basophils Absolute: 0 10*3/uL (ref 0.0–0.1)
EOS ABS: 0.2 10*3/uL (ref 0.0–0.7)
EOS PCT: 3.8 % (ref 0.0–5.0)
HCT: 34.9 % — ABNORMAL LOW (ref 39.0–52.0)
Hemoglobin: 11.4 g/dL — ABNORMAL LOW (ref 13.0–17.0)
Lymphocytes Relative: 24.2 % (ref 12.0–46.0)
Lymphs Abs: 1.4 10*3/uL (ref 0.7–4.0)
MCHC: 32.5 g/dL (ref 30.0–36.0)
MCV: 89.4 fl (ref 78.0–100.0)
Monocytes Absolute: 0.4 10*3/uL (ref 0.1–1.0)
Monocytes Relative: 7 % (ref 3.0–12.0)
Neutro Abs: 3.8 10*3/uL (ref 1.4–7.7)
Neutrophils Relative %: 64.3 % (ref 43.0–77.0)
Platelets: 245 10*3/uL (ref 150.0–400.0)
RBC: 3.91 Mil/uL — ABNORMAL LOW (ref 4.22–5.81)
RDW: 14.8 % — ABNORMAL HIGH (ref 11.5–14.6)
WBC: 5.9 10*3/uL (ref 4.5–10.5)

## 2013-09-04 LAB — COMPREHENSIVE METABOLIC PANEL
ALK PHOS: 68 U/L (ref 39–117)
ALT: 27 U/L (ref 0–53)
AST: 31 U/L (ref 0–37)
Albumin: 3.6 g/dL (ref 3.5–5.2)
BILIRUBIN TOTAL: 0.7 mg/dL (ref 0.3–1.2)
BUN: 16 mg/dL (ref 6–23)
CO2: 28 mEq/L (ref 19–32)
Calcium: 9.2 mg/dL (ref 8.4–10.5)
Chloride: 103 mEq/L (ref 96–112)
Creatinine, Ser: 1.3 mg/dL (ref 0.4–1.5)
GFR: 62.33 mL/min (ref >60.00)
Glucose, Bld: 134 mg/dL — ABNORMAL HIGH (ref 70–99)
Potassium: 4.4 mEq/L (ref 3.5–5.1)
Sodium: 136 mEq/L (ref 135–145)
Total Protein: 7.1 g/dL (ref 6.0–8.3)

## 2013-09-04 LAB — HEMOGLOBIN A1C: Hgb A1c MFr Bld: 8.6 % — ABNORMAL HIGH (ref 4.6–6.5)

## 2013-09-11 ENCOUNTER — Ambulatory Visit: Payer: BC Managed Care – PPO | Admitting: Family Medicine

## 2013-09-14 ENCOUNTER — Ambulatory Visit (INDEPENDENT_AMBULATORY_CARE_PROVIDER_SITE_OTHER): Payer: BC Managed Care – PPO | Admitting: Family Medicine

## 2013-09-14 ENCOUNTER — Encounter: Payer: Self-pay | Admitting: Family Medicine

## 2013-09-14 VITALS — BP 124/74 | HR 76 | Temp 98.5°F | Wt 273.5 lb

## 2013-09-14 DIAGNOSIS — E119 Type 2 diabetes mellitus without complications: Secondary | ICD-10-CM

## 2013-09-14 NOTE — Progress Notes (Signed)
Pre-visit discussion using our clinic review tool. No additional management support is needed unless otherwise documented below in the visit note.  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  He is at the radio station in the meantime. His appetite is good. He is trying to exercise on his bike, usually 72min a day.    Diabetes:  Using medications without difficulties:yes Hypoglycemic episodes:no Hyperglycemic episodes:no Feet problems:no Blood Sugars averaging: ~120, has been lower since he has been back exercising.  eye exam within last year: yes A1c stable but still >8.  Discussed.  Diet and exercise discussed.  He is trying to ease raw vegetables back into diet.   Meds, vitals, and allergies reviewed.   ROS: See HPI.  Otherwise negative.    GEN: nad, alert and oriented HEENT: mucous membranes moist NECK: supple w/o LA CV: rrr. PULM: ctab, no inc wob ABD: soft, +bs, prev colostomy site healed.  EXT: no edema SKIN: no acute rash  Diabetic foot exam: Normal inspection No skin breakdown No calluses  Normal DP pulses Normal sensation to light touch and monofilament Nails normal

## 2013-09-14 NOTE — Patient Instructions (Addendum)
Recheck A1c in about 3 months before a visit.  Keep working on [employer] and stick with the biking. Take care.  Glad to see you.

## 2013-09-15 NOTE — Assessment & Plan Note (Signed)
D/w pt. He'll work on diet and exercise, no change in meds. Recheck labs later in 2015.  He agrees.

## 2013-09-17 ENCOUNTER — Telehealth: Payer: Self-pay

## 2013-09-17 NOTE — Telephone Encounter (Signed)
Relevant patient education assigned to patient using Emmi. ° °

## 2013-09-19 ENCOUNTER — Other Ambulatory Visit: Payer: Self-pay | Admitting: Family Medicine

## 2013-10-05 ENCOUNTER — Telehealth (INDEPENDENT_AMBULATORY_CARE_PROVIDER_SITE_OTHER): Payer: Self-pay | Admitting: General Surgery

## 2013-10-05 ENCOUNTER — Other Ambulatory Visit (INDEPENDENT_AMBULATORY_CARE_PROVIDER_SITE_OTHER): Payer: Self-pay | Admitting: General Surgery

## 2013-10-05 NOTE — Telephone Encounter (Signed)
Please schedule pouchoscopy in [facility]

## 2013-10-05 NOTE — Telephone Encounter (Signed)
Pt wants to reschedule his procedure - no orders in [facility]

## 2013-10-21 ENCOUNTER — Telehealth: Payer: Self-pay | Admitting: Gastroenterology

## 2013-10-21 NOTE — Telephone Encounter (Signed)
Received 5 pages from [facility], sent to Dr. Ardis Hughs. 10/21/13/ss.

## 2013-11-16 ENCOUNTER — Encounter (HOSPITAL_COMMUNITY)
Admission: RE | Disposition: A | Discharge status: Home / Self Care | Payer: BC Managed Care – PPO | Source: Ambulatory Visit | Attending: General Surgery

## 2013-11-16 ENCOUNTER — Encounter (HOSPITAL_COMMUNITY): Payer: BC Managed Care – PPO | Admitting: Anesthesiology

## 2013-11-16 ENCOUNTER — Encounter (HOSPITAL_COMMUNITY): Payer: Self-pay | Admitting: *Deleted

## 2013-11-16 ENCOUNTER — Ambulatory Visit (HOSPITAL_COMMUNITY): Payer: BC Managed Care – PPO | Admitting: Anesthesiology

## 2013-11-16 ENCOUNTER — Ambulatory Visit (HOSPITAL_COMMUNITY)
Admission: RE | Admit: 2013-11-16 | Discharge: 2013-11-16 | Disposition: A | Discharge status: Home / Self Care | Payer: BC Managed Care – PPO | Source: Ambulatory Visit | Attending: General Surgery | Admitting: General Surgery

## 2013-11-16 DIAGNOSIS — Z932 Ileostomy status: Secondary | ICD-10-CM | POA: Insufficient documentation

## 2013-11-16 DIAGNOSIS — E119 Type 2 diabetes mellitus without complications: Secondary | ICD-10-CM | POA: Insufficient documentation

## 2013-11-16 DIAGNOSIS — I10 Essential (primary) hypertension: Secondary | ICD-10-CM | POA: Insufficient documentation

## 2013-11-16 DIAGNOSIS — Z7982 Long term (current) use of aspirin: Secondary | ICD-10-CM | POA: Insufficient documentation

## 2013-11-16 DIAGNOSIS — Z85038 Personal history of other malignant neoplasm of large intestine: Secondary | ICD-10-CM | POA: Insufficient documentation

## 2013-11-16 DIAGNOSIS — E785 Hyperlipidemia, unspecified: Secondary | ICD-10-CM | POA: Insufficient documentation

## 2013-11-16 DIAGNOSIS — Z860100 Personal history of colon polyps, unspecified: Secondary | ICD-10-CM

## 2013-11-16 DIAGNOSIS — Z87891 Personal history of nicotine dependence: Secondary | ICD-10-CM | POA: Insufficient documentation

## 2013-11-16 DIAGNOSIS — R011 Cardiac murmur, unspecified: Secondary | ICD-10-CM | POA: Insufficient documentation

## 2013-11-16 DIAGNOSIS — Z980 Intestinal bypass and anastomosis status: Secondary | ICD-10-CM | POA: Insufficient documentation

## 2013-11-16 DIAGNOSIS — Z09 Encounter for follow-up examination after completed treatment for conditions other than malignant neoplasm: Secondary | ICD-10-CM | POA: Insufficient documentation

## 2013-11-16 DIAGNOSIS — E039 Hypothyroidism, unspecified: Secondary | ICD-10-CM | POA: Insufficient documentation

## 2013-11-16 DIAGNOSIS — Z800 Family history of malignant neoplasm of digestive organs: Secondary | ICD-10-CM | POA: Insufficient documentation

## 2013-11-16 DIAGNOSIS — Z79899 Other long term (current) drug therapy: Secondary | ICD-10-CM | POA: Insufficient documentation

## 2013-11-16 HISTORY — PX: POUCHOSCOPY: SHX6321

## 2013-11-16 LAB — GLUCOSE, CAPILLARY: GLUCOSE-CAPILLARY: 163 mg/dL — AB (ref 70–99)

## 2013-11-16 SURGERY — ENDOSCOPY, POUCH, SMALL INTESTINE, DIAGNOSTIC
Anesthesia: Monitor Anesthesia Care

## 2013-11-16 NOTE — Op Note (Signed)
11/16/2013  8:02 AM  PATIENT:  Cory Harper  58 y.o. male  Patient Care Team: Tonia Ghent, MD as PCP - General (Family Medicine) Milus Banister, MD as Consulting Physician (Gastroenterology) Leighton Ruff, MD as Consulting Physician (General Surgery) Alvera Novel, MD as Referring Physician (Gastroenterology)  PRE-OPERATIVE DIAGNOSIS:  FAP  POST-OPERATIVE DIAGNOSIS:  same  PROCEDURE:  Procedure(s): POUCHOSCOPY  SURGEON:  Surgeon(s): Leighton Ruff, MD   ANESTHESIA:   none  SPECIMEN:  No Specimen  PATIENT DISPOSITION:  post procedure  INDICATION: This is a 58 year old male with a history of FAP. He is status post a total proctocolectomy with J-pouch anastomosis. He is doing well over the last year. He is no complaints. He denies any bleeding per rectum. He is here for yearly surveillance.   OR FINDINGS: Normal-appearing J-pouch.  DESCRIPTION: the patient was identified in the preoperative holding area and taken to the OR where they were laid In lateral decubitus position on the Endoscopy room table.   A surgical timeout was performed indicating the correct patient, procedure, positioning and need for preoperative antibiotics.   I began with a digital rectal exam. There were no masses noted. The staple line appeared patent. I inserted a EGD scope into the patient's anal canal and advanced it into the J-pouch. The J-pouch was irrigated with normal saline. I evaluated the defunctionalized limb first. The staple line appeared intact. There were no polyps. I then evaluated the functional limb to the level of the loop ileostomy anastomosis. This also appeared normal and patent. There were no polyps noted. I evaluated the body of the J-pouch. The staple line appeared to be well healed. There were no polyps noted. I then evaluated the anastomosis and rectal cuff and anal canal. I did not see any polyps there as well. There was no anastomotic stricture. The endoscope was then removed. The  patient tolerated the procedure well was sent to the postprocedure recovery room without difficulty.

## 2013-11-16 NOTE — Anesthesia Preprocedure Evaluation (Deleted)
Anesthesia Evaluation  Patient identified by MRN, date of birth, ID band Patient awake    Reviewed: Allergy & Precautions, H&P , NPO status , Patient's Chart, lab work & pertinent test results  Airway Mallampati: II TM Distance: >3 FB Neck ROM: Full    Dental no notable dental hx.    Pulmonary neg pulmonary ROS, former smoker,  breath sounds clear to auscultation  Pulmonary exam normal       Cardiovascular hypertension, Pt. on medications Rhythm:Regular Rate:Normal     Neuro/Psych negative neurological ROS  negative psych ROS   GI/Hepatic negative GI ROS, Neg liver ROS,   Endo/Other  diabetesHypothyroidism   Renal/GU negative Renal ROS  negative genitourinary   Musculoskeletal negative musculoskeletal ROS (+)   Abdominal   Peds negative pediatric ROS (+)  Hematology negative hematology ROS (+)   Anesthesia Other Findings   Reproductive/Obstetrics negative OB ROS                           Anesthesia Physical Anesthesia Plan  ASA: II  Anesthesia Plan: MAC   Post-op Pain Management:    Induction: Intravenous  Airway Management Planned: Nasal Cannula  Additional Equipment:   Intra-op Plan:   Post-operative Plan:   Informed Consent: I have reviewed the patients History and Physical, chart, labs and discussed the procedure including the risks, benefits and alternatives for the proposed anesthesia with the patient or authorized representative who has indicated his/her understanding and acceptance.   Dental advisory given  Plan Discussed with: CRNA and Surgeon  Anesthesia Plan Comments:         Anesthesia Quick Evaluation

## 2013-11-16 NOTE — H&P (Signed)
Cory Harper is an 58 y.o. male.   Chief Complaint: FAP HPI: Pt is s/p total proctocolectomy for FAP.  He is here for surveillance of his pouch.  He denies any problems with it.  He is s/p a normal upper EGD after having polypectomy the year before.    Past Medical History  Diagnosis Date  . Diabetes mellitus   . Nasal congestion   . Colonic mass   . Hyperlipidemia   . Heart murmur   . Cancer     stg II colon cancer s/p resection  . Arthritis   . Hypertension     not on blood pressure meds since 11/13   . Hypothyroidism   . FAP (familial adenomatous polyposis)   . Anemia     Past Surgical History  Procedure Laterality Date  . Knee arthroscopy  1998    Right knee  . Upper gastrointestinal endoscopy    . Wisdom tooth extraction    . Colon resection  05/20/2012    Procedure: COLON RESECTION LAPAROSCOPIC;  Surgeon: Adin Hector, MD;  Location: [facility];  Service: General;  Laterality: N/A;  Laparoscopic Proctocolectomy, Ileal Pouch Anal Anastomoisis, Loop Ileostomy  . Bowel resection  05/20/2012  . Thyroid lobectomy  05/20/2012    Procedure: THYROID LOBECTOMY;  Surgeon: Adin Hector, MD;  Location: [facility];  Service: General;  Laterality: Left;  LEFT THYROID LOBECTOMY  . Ileostomy  05/20/12  . Flexible sigmoidoscopy  08/01/2012    Procedure: FLEXIBLE SIGMOIDOSCOPY;  Surgeon: Leighton Ruff, MD;  Location: [facility];  Service: Endoscopy;  Laterality: N/A;  use endoscope  . Ileostomy closure N/A 09/18/2012    Procedure: Loop Ileostomy Takedown with EUA ;  Surgeon: Adin Hector, MD;  Location: [facility];  Service: General;  Laterality: N/A;  loop ileostomy takedown with EUA   . Esophagogastroduodenoscopy (egd) with propofol N/A 04/23/2013    Procedure: ESOPHAGOGASTRODUODENOSCOPY (EGD) WITH PROPOFOL;  Surgeon: Milus Banister, MD;  Location: [facility];  Service: Endoscopy;  Laterality: N/A;  side viewing scope    Family History  Problem Relation Age of Onset  . Uterine  cancer Mother   . Hypertension Mother   . Stroke Mother   . Colon cancer Mother   . Colon polyps Mother   . Cancer Mother     colon, endometrial  . Diabetes Father   . Obesity Father   . Pneumonia Father   . Cancer Father     skin - squamous   . Diabetes Paternal Grandmother   . Prostate cancer Neg Hx    Social History:  reports that he quit smoking about 22 years ago. He has never used smokeless tobacco. He reports that he does not drink alcohol or use illicit drugs.  Allergies:  Allergies  Allergen Reactions  . Ibuprofen Swelling    Made his joints swell Joint swelling    Medications Prior to Admission  Medication Sig Dispense Refill  . cholecalciferol (VITAMIN D) 1000 UNITS tablet Take 1,000 Units by mouth daily.       . ferrous fumarate (HEMOCYTE - 106 MG FE) 325 (106 FE) MG TABS tablet Take 1 tablet by mouth 2 (two) times daily.      . fluticasone (FLONASE) 50 MCG/ACT nasal spray Place 2 sprays into the nose daily.      Marland Kitchen glimepiride (AMARYL) 2 MG tablet Take 2 mg by mouth 2 (two) times daily.       Marland Kitchen levothyroxine (SYNTHROID, LEVOTHROID) 100 MCG  tablet take 1 tablet by mouth every morning  30 tablet  5  . loperamide (IMODIUM A-D) 2 MG tablet Take 2-4 mg by mouth 2 (two) times daily as needed for diarrhea or loose stools (Goal is 4-6 BMs / day). For diarrhea      . loratadine (CLARITIN) 10 MG tablet Take 10 mg by mouth daily.       . metFORMIN (GLUCOPHAGE) 1000 MG tablet Take 1,000 mg by mouth 2 (two) times daily with a meal.      . ONGLYZA 5 MG TABS tablet take 1 tablet by mouth once daily WITH DINNER  30 tablet  5  . rosuvastatin (CRESTOR) 10 MG tablet Take 10 mg by mouth at bedtime.      . vitamin B-12 (CYANOCOBALAMIN) 500 MCG tablet Take 500 mcg by mouth daily.      Marland Kitchen aspirin 81 MG tablet Take 81 mg by mouth daily.      . pantoprazole (PROTONIX) 40 MG tablet Take 40 mg by mouth daily.        Results for orders placed during the hospital encounter of 11/16/13 (from  the past 48 hour(s))  GLUCOSE, CAPILLARY     Status: Abnormal   Collection Time    11/16/13  7:01 AM      Result Value Ref Range   Glucose-Capillary 163 (*) 70 - 99 mg/dL   No results found.  Review of Systems  Constitutional: Negative for fever and chills.  Eyes: Negative for blurred vision.  Respiratory: Negative for cough.   Cardiovascular: Negative for chest pain.  Gastrointestinal: Negative for nausea, vomiting, abdominal pain and blood in stool.  Genitourinary: Negative for dysuria, urgency and frequency.  Musculoskeletal: Negative for myalgias.  Skin: Negative for rash.  Neurological: Negative for headaches.    Blood pressure 140/57, pulse 71, temperature 98.4 F (36.9 C), temperature source Oral, resp. rate 23, height 5\' 11"  (1.803 m), weight 273 lb (123.832 kg), SpO2 97.00%. Physical Exam  Constitutional: He is oriented to person, place, and time. He appears well-developed and well-nourished.  HENT:  Head: Normocephalic and atraumatic.  Eyes: Pupils are equal, round, and reactive to light.  Neck: Normal range of motion.  Cardiovascular: Normal rate and regular rhythm.   Respiratory: Effort normal and breath sounds normal.  GI: Soft. Bowel sounds are normal. He exhibits no distension. There is no tenderness.  Musculoskeletal: Normal range of motion.  Neurological: He is alert and oriented to person, place, and time.  Skin: Skin is warm and dry.     Assessment/Plan Will do pouchoscopy for screening purposes today.  Risks and benefits explained.  Risks include bleeding, perforation and discomfort.  Pt agrees.  Leighton Ruff 9/74/1638, 7:26 AM

## 2013-11-17 ENCOUNTER — Encounter (HOSPITAL_COMMUNITY): Payer: Self-pay | Admitting: General Surgery

## 2013-11-25 IMAGING — CT CT CHEST W/ CM
2 of 8 series · 14 of 36 positions shown, 17 images · IV contrast (omnipaque)
Comparison: None.

CT CHEST

***ADDENDUM*** CREATED: 03/28/2012 [DATE]

Addendum for clarification:
As noted in the CT chest findings/impression, there is a 2.0 cm
left thyroid nodule, unrelated to the presenting complaint.
However, if not previously characterized, a thyroid ultrasound is
suggested for further evaluation.
***END ADDENDUM*** SIGNED BY: Danguole Ir Danguoliukas, M.D.
CLINICAL DATA: Heme positive stool, recent colonoscopy with two
suspicious polyps.
CT CHEST, ABDOMEN AND PELVIS WITH CONTRAST
TECHNIQUE: Multidetector CT imaging of the chest, abdomen and
pelvis was performed following the standard protocol during bolus
administration of intravenous contrast.
Contrast: 100mL OMNIPAQUE IOHEXOL 300 MG/ML  SOLN

[Series 3: cap with · axial · 0.86mm/px · z∈[-658,-198]mm · 12 of 108 slices shown, 15 images]
[im 8/108  mediastinal]
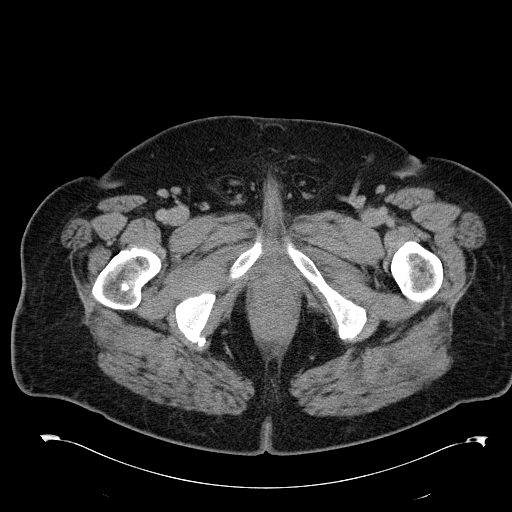
[im 8/108  lung]
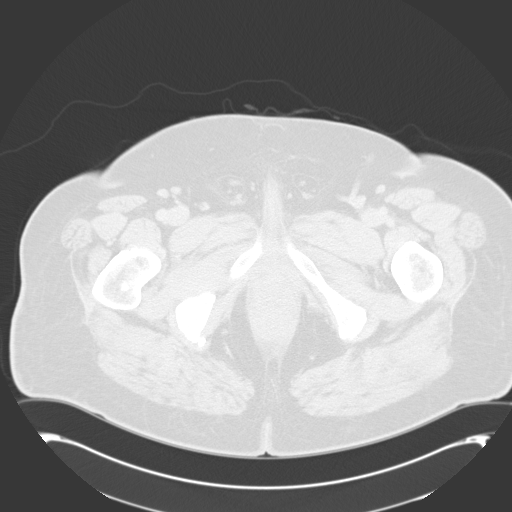
[im 15/108  lung]
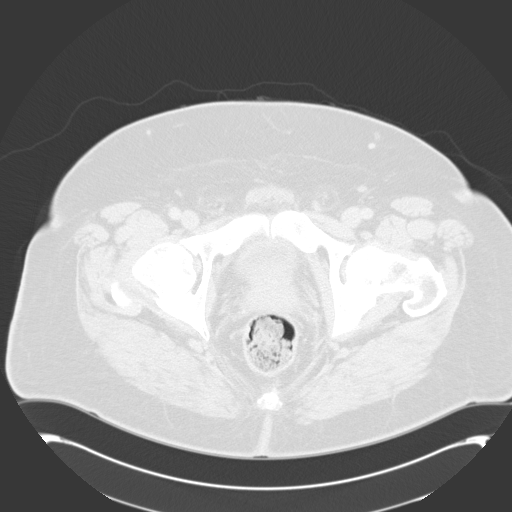
[im 22/108  lung]
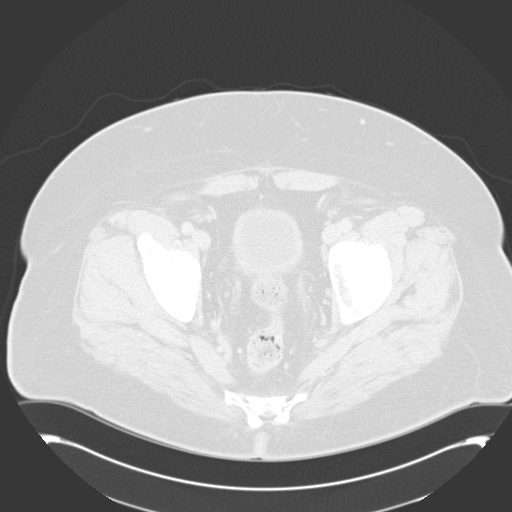
[im 36/108  lung]
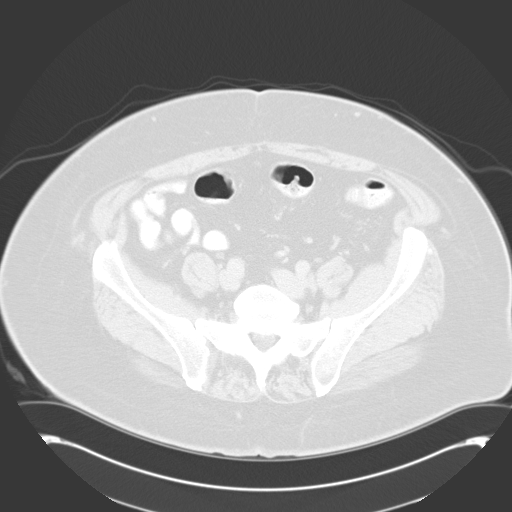
[im 43/108  mediastinal]
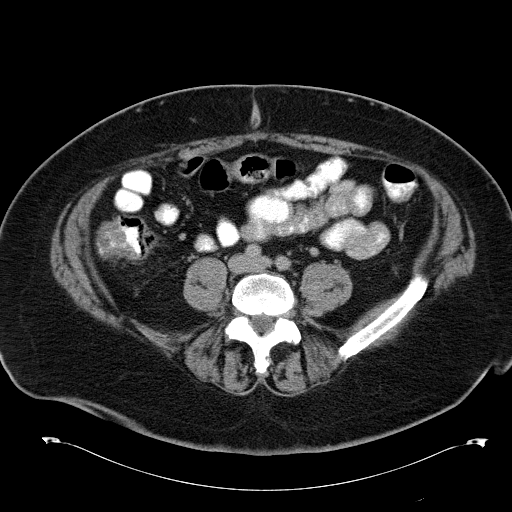
[im 43/108  lung]
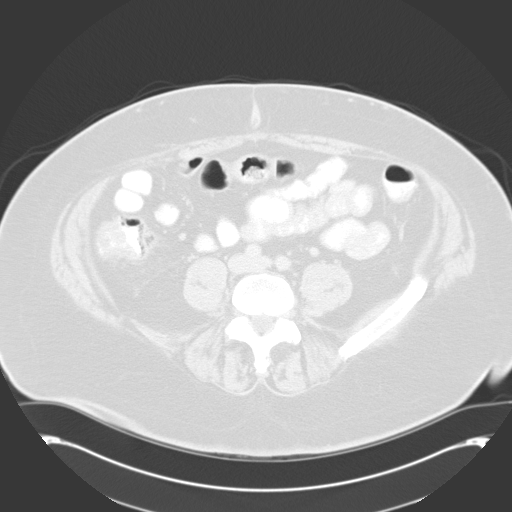
[im 50/108  lung]
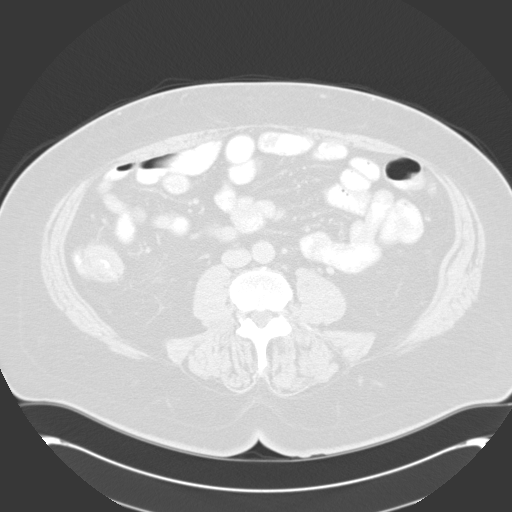
[im 58/108  lung]
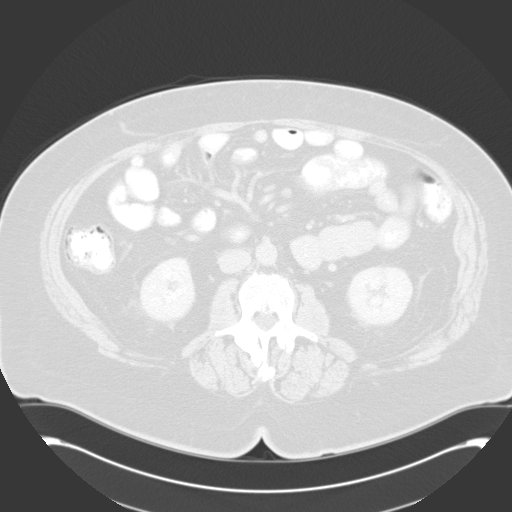
[im 65/108  lung]
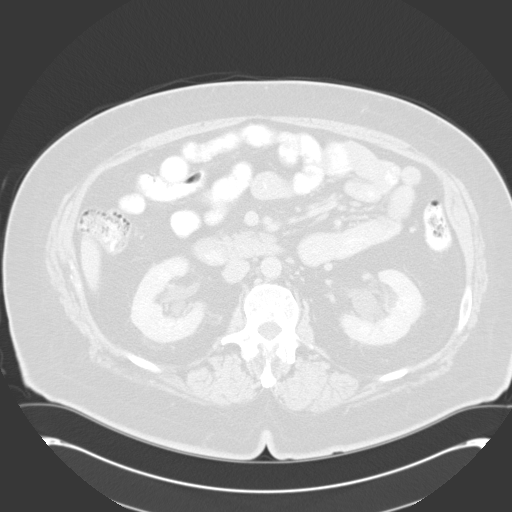
[im 72/108  mediastinal]
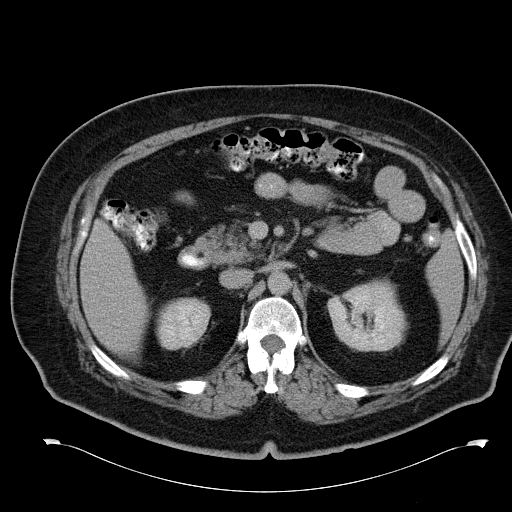
[im 72/108  lung]
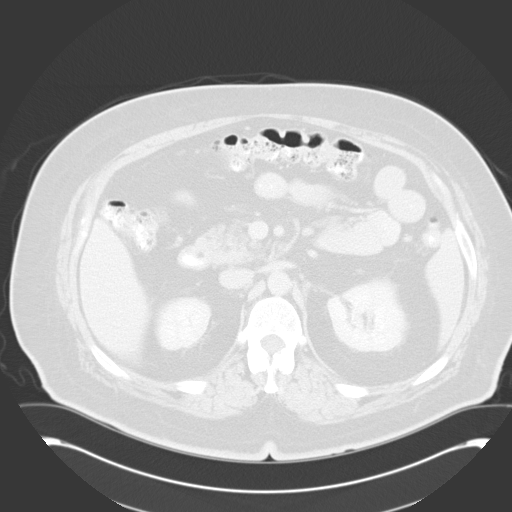
[im 86/108  lung]
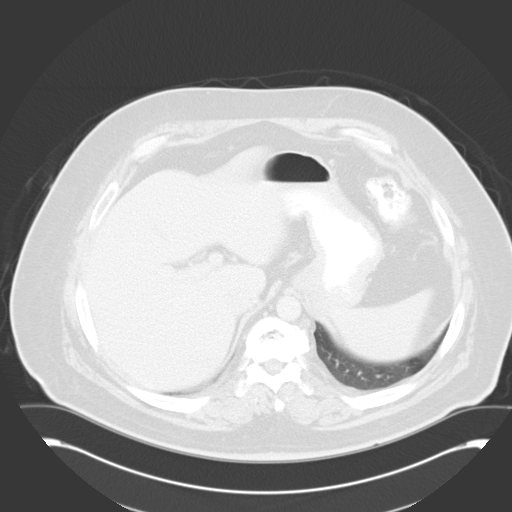
[im 93/108  lung]
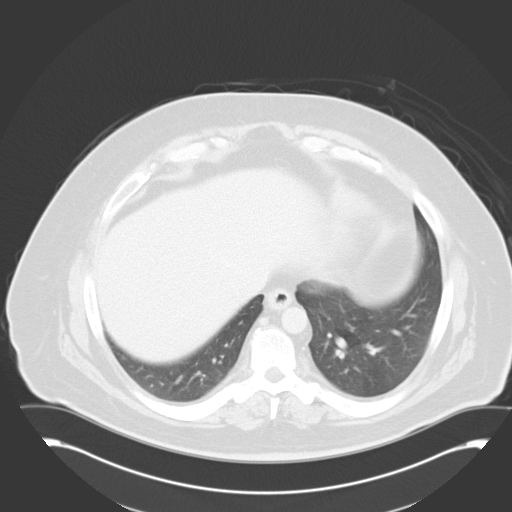
[im 100/108  lung]
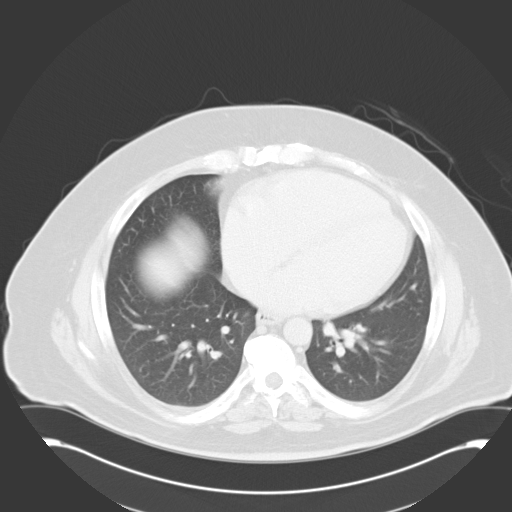

[Series 604: cor · coronal · 1.05mm/px · 2 of 132 slices shown]
[im 44/132  lung]
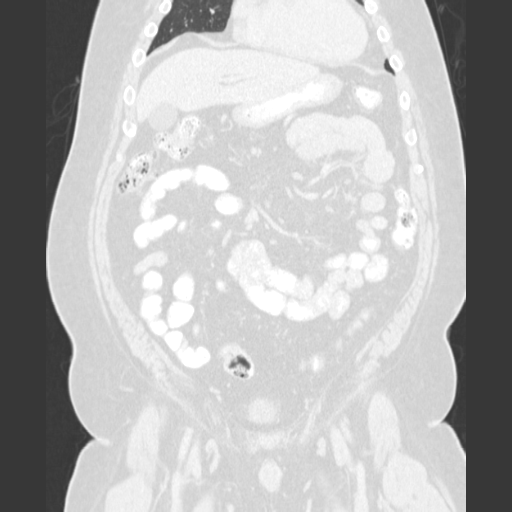
[im 88/132  lung]
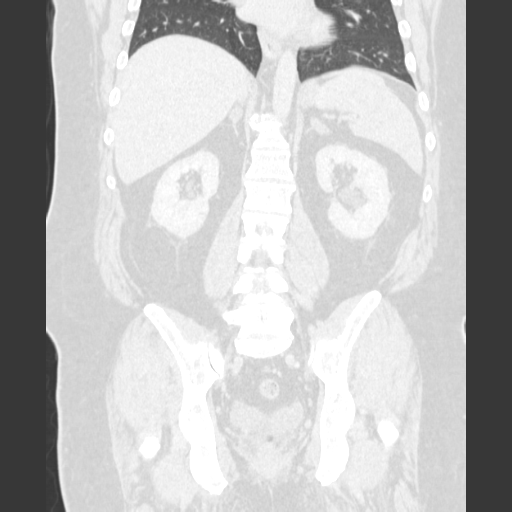

[14 of 36 positions shown; findings below may reference images not displayed]

FINDINGS: Lungs are clear.  No suspicious pulmonary nodules.  No
pleural effusion or pneumothorax.

The visualized thyroid is notable for a 2.0 cm left thyroid nodule.

The heart is normal in size.  No pericardial effusion.

No suspicious mediastinal, hilar, or axillary lymphadenopathy.

Degenerative changes of the thoracic spine.
IMPRESSION: No evidence of metastatic disease in the chest.

2.0 cm left thyroid nodule.

CT ABDOMEN AND PELVIS
FINDINGS: Liver, spleen, and pancreas are within normal limits.

Low density bilateral adrenal nodules, favored to reflect adrenal
adenomas, although strictly indeterminate. Right adrenal nodule
measures 3.1 x 1.9 cm.  Dominant left adrenal nodule measures 1.7 x
1.3 cm.

Multiple left renal sinus cysts.  Right kidney is unremarkable.  No
hydronephrosis.

No evidence of bowel obstruction.  Normal appendix.  Eccentric wall
thickening involving the ascending colon (series 3/image 60;
coronal image 63), suspicious for primary colonic adenocarcinoma.

Associated small ileocolic lymph nodes measuring up to 8 mm short
axis (series 3/image 53).  Additional small retroperitoneal/pelvic
lymph nodes which do not meet pathologic CT size criteria.

No evidence of abdominal aortic aneurysm.

No abdominopelvic ascites.

Mild prostatomegaly, measuring 5.4 cm in transverse dimension.

Bladder is mildly thick-walled although underdistended.

Degenerative changes of the lumbar spine.  Multiple probable bone
islands in the bilateral pelvis.
IMPRESSION: Eccentric wall thickening involving the ascending colon, suspicious
for primary colonic adenocarcinoma.

Associated small ileocolic lymph nodes measuring up to 8 mm short
axis.

Otherwise, no evidence of metastatic disease in the abdomen/pelvis.

## 2013-12-02 IMAGING — US US SOFT TISSUE HEAD/NECK
1 series · 14 of 25 positions shown · non-contrast
Comparison: Chest CT 03/28/2012.

CLINICAL DATA: Thyroid nodule.

THYROID ULTRASOUND
TECHNIQUE: Ultrasound examination of the thyroid gland and adjacent
soft tissues was performed.

[Series 1: us soft tissue head/neck · 0.07mm/px · 14 of 46 slices shown]
[im 1/46]
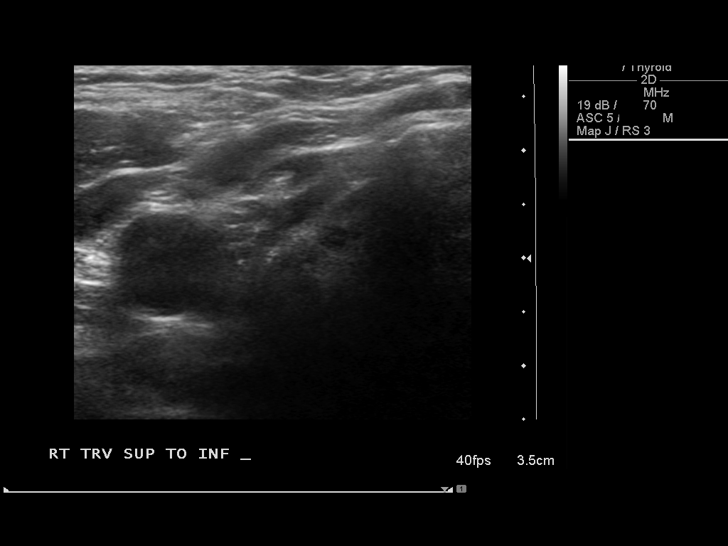
[im 4/46]
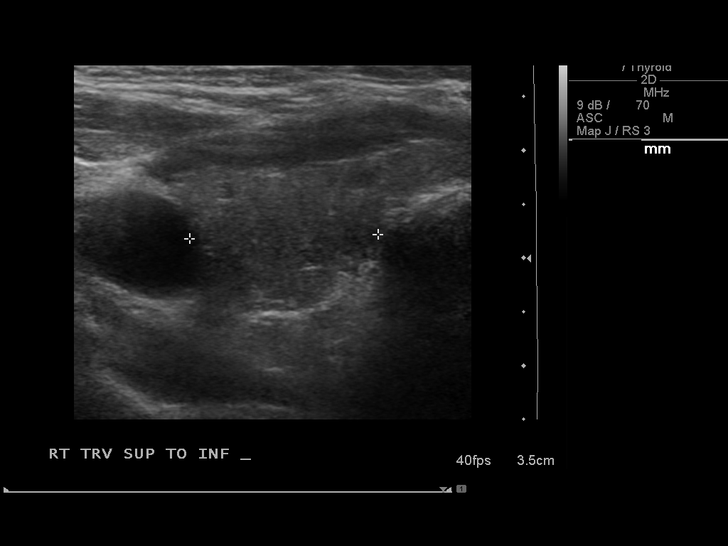
[im 8/46]
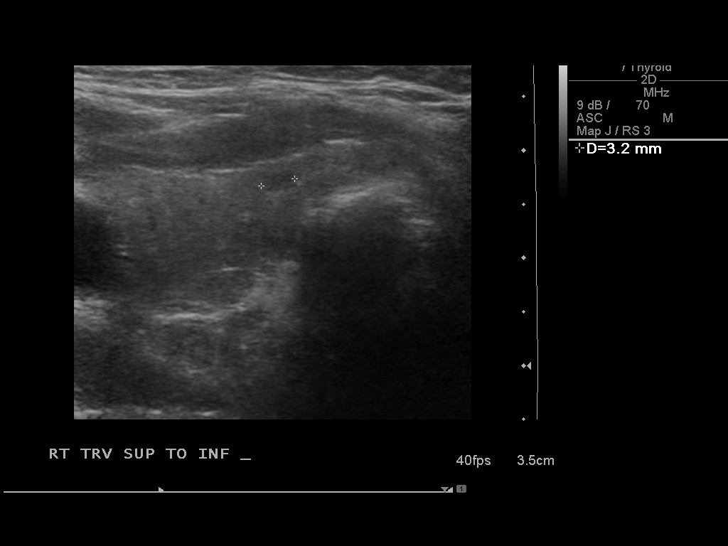
[im 12/46]
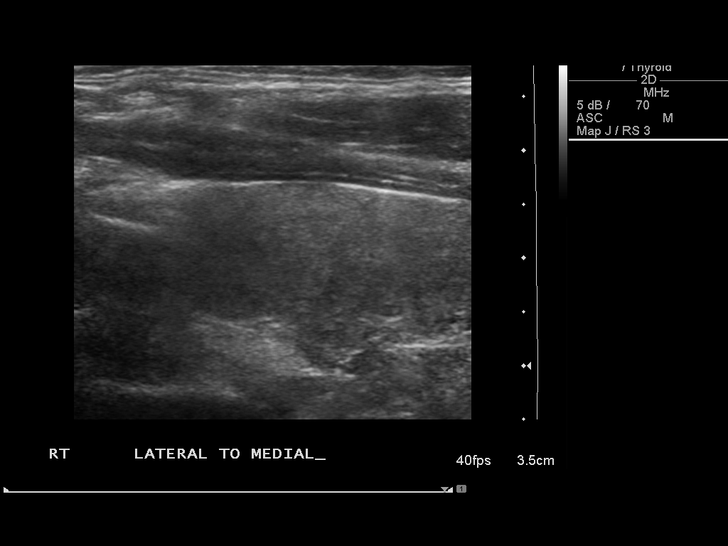
[im 16/46]
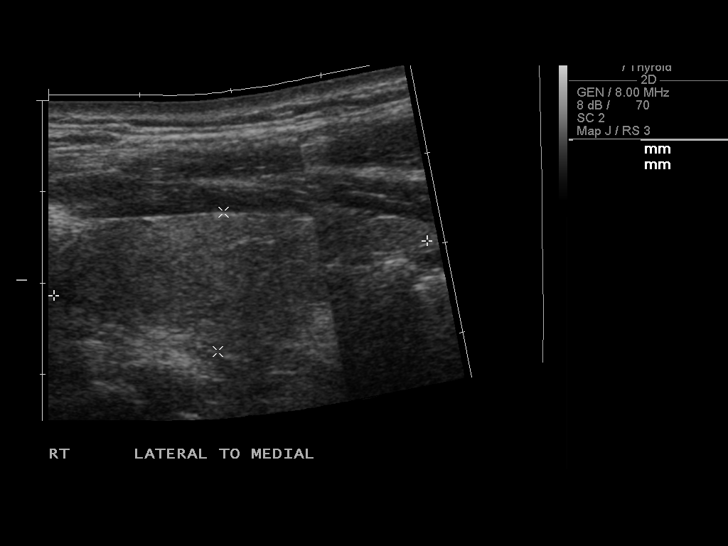
[im 17/46]
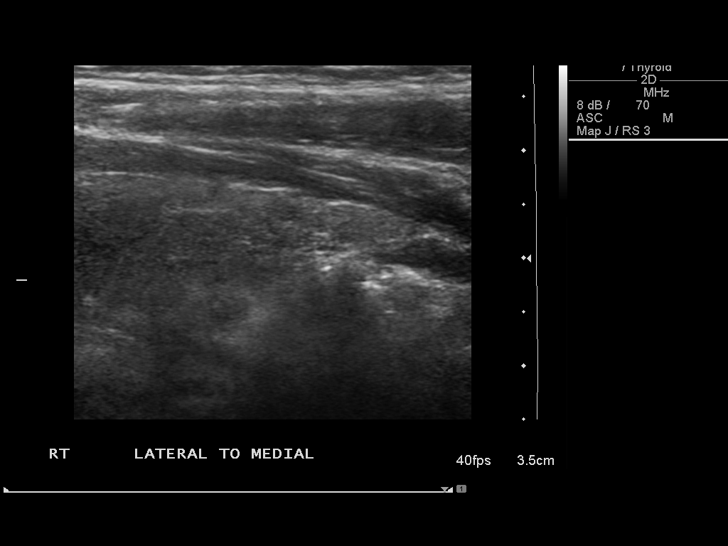
[im 21/46]
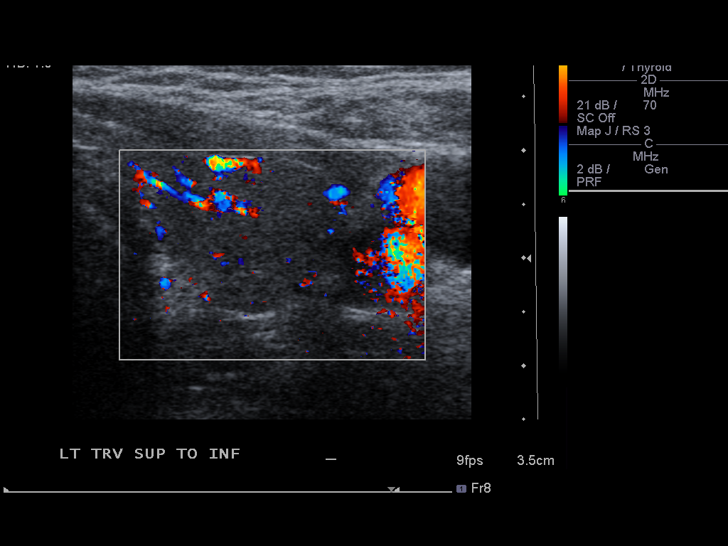
[im 25/46]
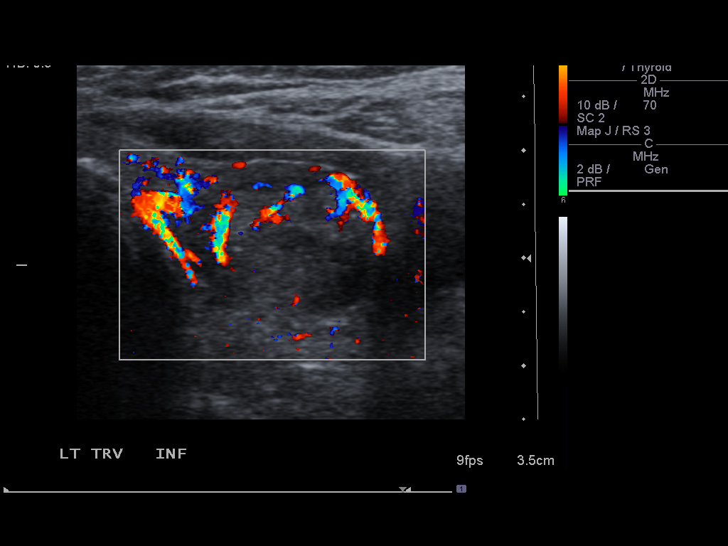
[im 29/46]
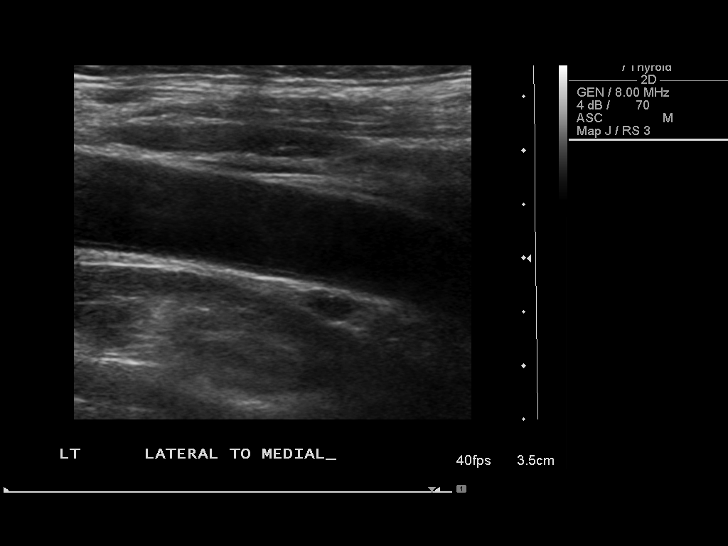
[im 31/46]
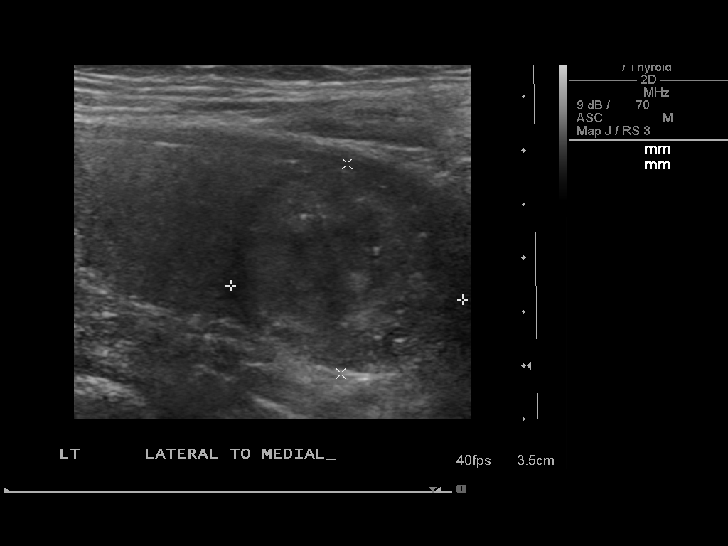
[im 34/46]
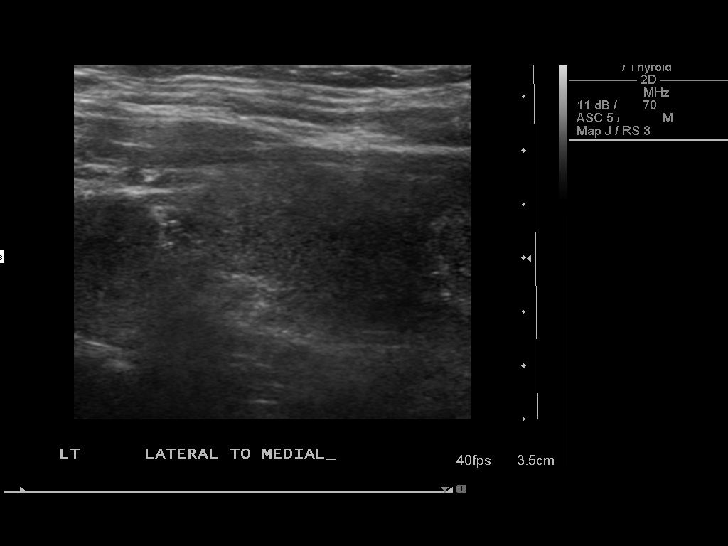
[im 38/46]
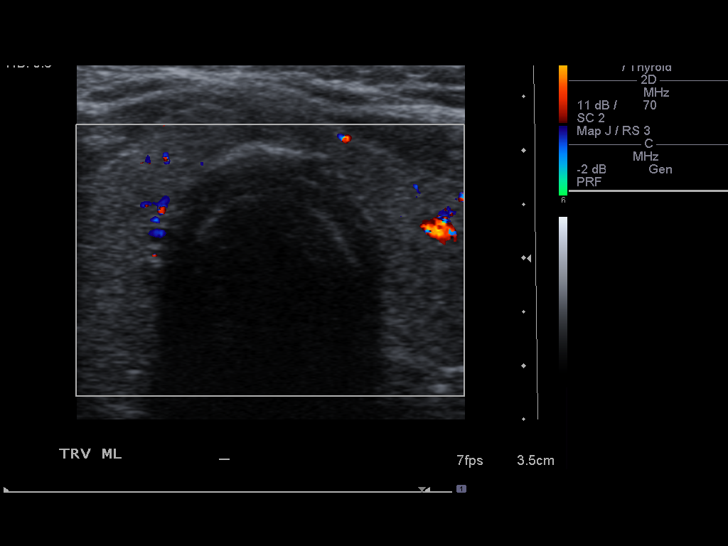
[im 42/46]
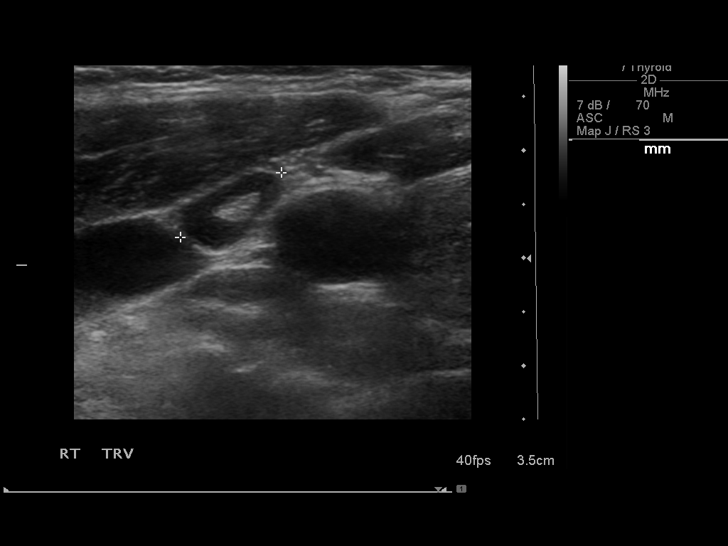
[im 46/46]
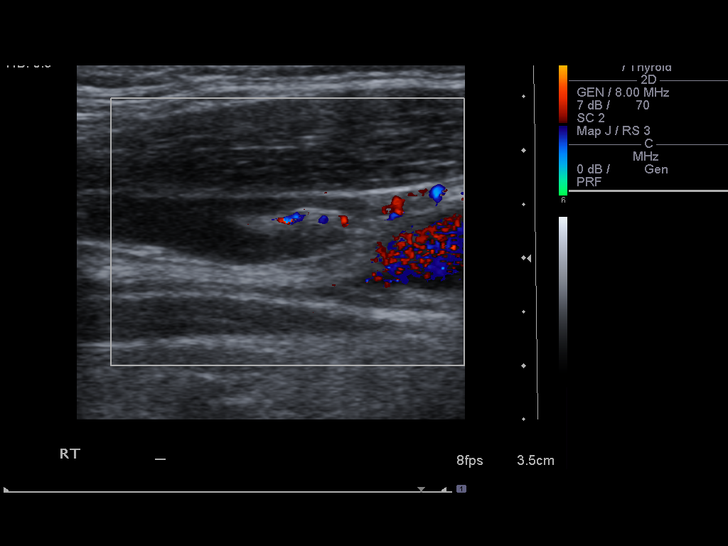

[14 of 25 positions shown; findings below may reference images not displayed]

FINDINGS: Right thyroid lobe:  41 mm x 15 mm x 18 mm.  3 mm hypoechoic
minuscule nodule in the inferior pole.  Mildly heterogeneous
echotexture.
Left thyroid lobe:  45 mm x 21 mm x 22 mm.  Mildly heterogeneous
echotexture.
Isthmus:  6 mm.

Focal nodules:  22 mm x 20 mm x 18 mm solid nodule in the inferior
left thyroid lobe.  Internal vascular flow is present.  This meets
criteria for thyroid biopsy.

Lymphadenopathy:  There is a prominent lymph node in the right neck
deep to the sternocleidomastoid measuring 29 mm x 11 mm x 6 mm
within normal fatty hilum.  No other enlarged lymph nodes and this
probably within normal limits.
IMPRESSION: Solid left inferior pole thyroid nodule measuring 22 x 20 x 18 mm.
Ultrasound-guided biopsy recommended.

## 2013-12-06 ENCOUNTER — Other Ambulatory Visit: Payer: Self-pay | Admitting: Family Medicine

## 2013-12-06 DIAGNOSIS — E119 Type 2 diabetes mellitus without complications: Secondary | ICD-10-CM

## 2013-12-11 ENCOUNTER — Other Ambulatory Visit (INDEPENDENT_AMBULATORY_CARE_PROVIDER_SITE_OTHER): Payer: BC Managed Care – PPO

## 2013-12-11 ENCOUNTER — Other Ambulatory Visit: Payer: BC Managed Care – PPO

## 2013-12-11 ENCOUNTER — Other Ambulatory Visit: Payer: Self-pay | Admitting: *Deleted

## 2013-12-11 DIAGNOSIS — E119 Type 2 diabetes mellitus without complications: Secondary | ICD-10-CM

## 2013-12-11 LAB — HEMOGLOBIN A1C: Hgb A1c MFr Bld: 8.6 % — ABNORMAL HIGH (ref 4.6–6.5)

## 2013-12-14 ENCOUNTER — Encounter: Payer: Self-pay | Admitting: Family Medicine

## 2013-12-14 ENCOUNTER — Ambulatory Visit (INDEPENDENT_AMBULATORY_CARE_PROVIDER_SITE_OTHER): Payer: BC Managed Care – PPO | Admitting: Family Medicine

## 2013-12-14 VITALS — BP 124/74 | HR 70 | Temp 98.2°F | Wt 272.0 lb

## 2013-12-14 DIAGNOSIS — E119 Type 2 diabetes mellitus without complications: Secondary | ICD-10-CM

## 2013-12-14 NOTE — Assessment & Plan Note (Signed)
Diet and weight loss are the issues here, along with exercise.  D/w pt.  He agrees.  He'll work on both.  Recheck later in 2015.

## 2013-12-14 NOTE — Patient Instructions (Signed)
I would work on diet and exercise.  Recheck labs in about 3 months before a visit and we'll go from there.  Take care.  Glad to see you.

## 2013-12-14 NOTE — Progress Notes (Signed)
Pre visit review using our clinic review tool, if applicable. No additional management support is needed unless otherwise documented below in the visit note.  Diabetes:  Using medications without difficulties:yes Hypoglycemic episodes:no and no driving complications Hyperglycemic episodes:no Feet problems:no Blood Sugars averaging: usually ~100 if adhering to diet.   eye exam within last year: yes A1c similar to prev.  D/w pt about weight and diet, exercise.  He's getting back on the bike at night for exercise.  He's going to get back to walking.    He had his pouch exam, that was okay.  I'll defer to other MDs.  He agrees.  D/w pt.   Meds, vitals, and allergies reviewed.   ROS: See HPI.  Otherwise negative.    GEN: nad, alert and oriented HEENT: mucous membranes moist NECK: supple w/o LA CV: rrr. PULM: ctab, no inc wob ABD: soft, +bs, scar well healed EXT: no edema SKIN: no acute rash  Diabetic foot exam: Normal inspection No skin breakdown No calluses  Normal DP pulses Normal sensation to light touch and monofilament Nails normal

## 2013-12-17 ENCOUNTER — Ambulatory Visit (HOSPITAL_BASED_OUTPATIENT_CLINIC_OR_DEPARTMENT_OTHER): Payer: BC Managed Care – PPO | Admitting: Nurse Practitioner

## 2013-12-17 ENCOUNTER — Telehealth: Payer: Self-pay | Admitting: Oncology

## 2013-12-17 ENCOUNTER — Other Ambulatory Visit (HOSPITAL_BASED_OUTPATIENT_CLINIC_OR_DEPARTMENT_OTHER): Payer: BC Managed Care – PPO

## 2013-12-17 VITALS — BP 142/68 | HR 68 | Temp 98.0°F | Resp 18 | Ht 71.0 in | Wt 270.0 lb

## 2013-12-17 DIAGNOSIS — C180 Malignant neoplasm of cecum: Secondary | ICD-10-CM

## 2013-12-17 DIAGNOSIS — C73 Malignant neoplasm of thyroid gland: Secondary | ICD-10-CM

## 2013-12-17 DIAGNOSIS — E119 Type 2 diabetes mellitus without complications: Secondary | ICD-10-CM

## 2013-12-17 LAB — CEA: CEA: <0.5 ng/mL (ref 0.0–5.0)

## 2013-12-17 NOTE — Telephone Encounter (Signed)
gv adn prnted appt sched and avs for pt for NOV °

## 2013-12-17 NOTE — Progress Notes (Signed)
  [place] OFFICE PROGRESS NOTE   Diagnosis:  Colon cancer.  INTERVAL HISTORY:   Cory Harper returns as scheduled. He feels well. He reports 4-6 bowel movements per day. No bleeding or pain with bowel movements. No nausea or vomiting. He has a good appetite. Weight is stable. No shortness of breath or cough.  He underwent a pouchoscopy by Dr. Marcello Moores on 11/16/2013. The J-pouch was a normal-appearing. He reports having an upper endoscopy at [facility] in March of this year.    Objective:  Vital signs in last 24 hours:  Blood pressure 142/68, pulse 68, temperature 98 F (36.7 C), temperature source Oral, resp. rate 18, height 5\' 11"  (1.803 m), weight 270 lb (122.471 kg), SpO2 97.00%.    HEENT: No thrush or ulcerations. Lymphatics: No palpable cervical, supraclavicular, axillary or inguinal lymph nodes. Resp: Lungs clear. Cardio: Regular cardiac rhythm. GI: Soft and nontender. No organomegaly. No mass. Vascular: No leg edema.   Lab Results:  Lab Results  Component Value Date   WBC 5.9 09/04/2013   HGB 11.4* 09/04/2013   HCT 34.9* 09/04/2013   MCV 89.4 09/04/2013   PLT 245.0 09/04/2013   NEUTROABS 3.8 09/04/2013    Imaging:  No results found.  Medications: I have reviewed the patient's current medications.  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 Ileostomy takedown 09/18/2012.  2. Familial polyposis with multiple adenomatous polyps noted on the colectomy specimen 05/20/2012 -followed by Dr. Ardis Hughs for surveillance endoscopies.  3. 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  4. Diabetes.  5. Status post endoscopic removal of a duodenal polyp at [facility] on 06/08/2013.  6. Status post followup EGD at [facility] 10/19/2013.  7. Status post  pouchoscopy by Dr. Marcello Moores 11/16/2013. Normal appearing J-pouch.      Disposition: He remains in clinical remission from colon cancer. We will followup on the [name] from today. He will return for a followup visit and [name] in 6 months. He is up-to-date on surveillance endoscopies.  He will contact the office prior to his next visit with any problems.  Plan reviewed with Dr. Benay Spice.    Owens Shark ANP/GNP-BC   12/17/2013  10:56 AM

## 2013-12-18 ENCOUNTER — Telehealth: Payer: Self-pay

## 2013-12-18 ENCOUNTER — Telehealth: Payer: Self-pay | Admitting: *Deleted

## 2013-12-18 ENCOUNTER — Other Ambulatory Visit: Payer: Self-pay

## 2013-12-18 NOTE — Telephone Encounter (Signed)
Message copied by Tania Ade on Fri Dec 18, 2013 10:28 AM ------      Message from: Betsy Coder B      Created: Thu Dec 17, 2013  8:11 PM       Please call patient, cea is normal ------

## 2013-12-18 NOTE — Telephone Encounter (Signed)
Message copied by Tyler Aas A on Fri Dec 18, 2013 12:26 PM ------      Message from: Betsy Coder B      Created: Thu Dec 17, 2013  8:11 PM       Please call patient, cea is normal ------

## 2013-12-18 NOTE — Telephone Encounter (Signed)
Notified [name] nl at 0.5

## 2013-12-18 NOTE — Telephone Encounter (Signed)
Disregard. Phone call has already been made by [facility]

## 2013-12-21 ENCOUNTER — Telehealth (INDEPENDENT_AMBULATORY_CARE_PROVIDER_SITE_OTHER): Payer: Self-pay

## 2013-12-21 NOTE — Telephone Encounter (Signed)
Faxed rx refill request to [facility] [phone] on Ferrous Sulfate 325mg  #60 w/3 additional refills per Dr Johney Maine. Confirmation came thru the fax.

## 2014-01-07 ENCOUNTER — Other Ambulatory Visit: Payer: Self-pay | Admitting: Family Medicine

## 2014-02-02 ENCOUNTER — Other Ambulatory Visit: Payer: Self-pay | Admitting: Family Medicine

## 2014-02-02 NOTE — Telephone Encounter (Signed)
This medication has not been filled in over 1 year--i do not see any f/u notes in reference to pt to be still taking this medication-- last Lipid panel was 04/2013 and last OV with you was  12/14/13--please advise on refill of medication

## 2014-02-03 NOTE — Telephone Encounter (Signed)
Verify with patient about status on med. Let me know.  Thanks.

## 2014-02-17 ENCOUNTER — Other Ambulatory Visit: Payer: Self-pay | Admitting: Family Medicine

## 2014-03-07 ENCOUNTER — Other Ambulatory Visit: Payer: Self-pay | Admitting: Family Medicine

## 2014-03-07 DIAGNOSIS — E119 Type 2 diabetes mellitus without complications: Secondary | ICD-10-CM

## 2014-03-11 ENCOUNTER — Other Ambulatory Visit (INDEPENDENT_AMBULATORY_CARE_PROVIDER_SITE_OTHER): Payer: BC Managed Care – PPO

## 2014-03-11 DIAGNOSIS — E038 Other specified hypothyroidism: Secondary | ICD-10-CM

## 2014-03-11 DIAGNOSIS — E119 Type 2 diabetes mellitus without complications: Secondary | ICD-10-CM

## 2014-03-11 DIAGNOSIS — E538 Deficiency of other specified B group vitamins: Secondary | ICD-10-CM

## 2014-03-11 DIAGNOSIS — E7800 Pure hypercholesterolemia, unspecified: Secondary | ICD-10-CM

## 2014-03-11 DIAGNOSIS — I10 Essential (primary) hypertension: Secondary | ICD-10-CM

## 2014-03-11 LAB — COMPREHENSIVE METABOLIC PANEL
ALK PHOS: 64 U/L (ref 39–117)
ALT: 23 U/L (ref 0–53)
AST: 29 U/L (ref 0–37)
Albumin: 3.6 g/dL (ref 3.5–5.2)
BILIRUBIN TOTAL: 0.8 mg/dL (ref 0.2–1.2)
BUN: 20 mg/dL (ref 6–23)
CO2: 27 meq/L (ref 19–32)
CREATININE: 1.4 mg/dL (ref 0.4–1.5)
Calcium: 9.1 mg/dL (ref 8.4–10.5)
Chloride: 102 mEq/L (ref 96–112)
GFR: 54.25 mL/min — AB (ref >60.00)
GLUCOSE: 178 mg/dL — AB (ref 70–99)
Potassium: 4.4 mEq/L (ref 3.5–5.1)
Sodium: 135 mEq/L (ref 135–145)
TOTAL PROTEIN: 6.9 g/dL (ref 6.0–8.3)

## 2014-03-11 LAB — MICROALBUMIN / CREATININE URINE RATIO
Creatinine,U: 49.2 mg/dL
MICROALB/CREAT RATIO: 0.2 mg/g (ref 0.0–30.0)
Microalb, Ur: 0.1 mg/dL (ref 0.0–1.9)

## 2014-03-11 LAB — LIPID PANEL
CHOLESTEROL: 105 mg/dL (ref 0–200)
HDL: 34 mg/dL — AB (ref >39.00)
LDL CALC: 31 mg/dL (ref 0–99)
NonHDL: 71
TRIGLYCERIDES: 198 mg/dL — AB (ref 0.0–149.0)
Total CHOL/HDL Ratio: 3
VLDL: 39.6 mg/dL (ref 0.0–40.0)

## 2014-03-11 LAB — HEMOGLOBIN A1C: HEMOGLOBIN A1C: 9.5 % — AB (ref 4.6–6.5)

## 2014-03-11 LAB — TSH: TSH: 1.63 u[IU]/mL (ref 0.35–4.50)

## 2014-03-12 ENCOUNTER — Other Ambulatory Visit: Payer: BC Managed Care – PPO

## 2014-03-18 ENCOUNTER — Ambulatory Visit (INDEPENDENT_AMBULATORY_CARE_PROVIDER_SITE_OTHER): Payer: BC Managed Care – PPO | Admitting: Family Medicine

## 2014-03-18 ENCOUNTER — Encounter: Payer: Self-pay | Admitting: Family Medicine

## 2014-03-18 VITALS — BP 132/70 | HR 72 | Temp 98.0°F | Wt 276.5 lb

## 2014-03-18 DIAGNOSIS — IMO0001 Reserved for inherently not codable concepts without codable children: Secondary | ICD-10-CM

## 2014-03-18 DIAGNOSIS — E1165 Type 2 diabetes mellitus with hyperglycemia: Principal | ICD-10-CM

## 2014-03-18 MED ORDER — SAXAGLIPTIN HCL 5 MG PO TABS: ORAL_TABLET | ORAL | Status: DC

## 2014-03-18 NOTE — Progress Notes (Signed)
Pre visit review using our clinic review tool, if applicable. No additional management support is needed unless otherwise documented below in the visit note.  Diabetes:  Using medications without difficulties:yes Hypoglycemic episodes:no Hyperglycemic episodes:see below Feet problems: Blood Sugars averaging: variable eye exam within last year: yes, due this fall "When I do what I should, my sugar is fine, around 125.  If I don't, it goes up." Exercise has been episodic, sugar better with exercise.  He's been more adherent recently.    Meds, vitals, and allergies reviewed.   ROS: See HPI.  Otherwise negative.    GEN: nad, alert and oriented HEENT: mucous membranes moist NECK: supple w/o LA CV: rrr. PULM: ctab, no inc wob ABD: soft, +bs EXT: no edema SKIN: no acute rash  Diabetic foot exam: Normal inspection No skin breakdown No calluses  Normal DP pulses Normal sensation to light touch and monofilament Nails normal

## 2014-03-18 NOTE — Patient Instructions (Signed)
Plan on a visit in 3 months, labs ahead of time.  Check to see if byetta is allowed with your work.  Work on [employer] and exercise in the meantime. Check your sugar in the AMs and let me know about that in about 2 weeks.  Take care.

## 2014-03-18 NOTE — Assessment & Plan Note (Signed)
He'll work on diet and weight, I'll check on other med options in the meantime.  He agrees.  He'll update me with his Am sugars in about 2 weeks.  Labs d/w pt.

## 2014-04-02 ENCOUNTER — Ambulatory Visit (INDEPENDENT_AMBULATORY_CARE_PROVIDER_SITE_OTHER): Payer: BC Managed Care – PPO

## 2014-04-02 DIAGNOSIS — Z23 Encounter for immunization: Secondary | ICD-10-CM

## 2014-04-07 ENCOUNTER — Telehealth: Payer: Self-pay | Admitting: Family Medicine

## 2014-04-07 DIAGNOSIS — E119 Type 2 diabetes mellitus without complications: Secondary | ICD-10-CM

## 2014-04-07 NOTE — Telephone Encounter (Signed)
Please call pt.  I checked on options re: Dm2 meds.  byetta is likley not a good option given his thyroid history.   Fortunately, his sugars are greatly improved recently with diet and exercise.  Now with sugars usually ~100-125 in AM.  Wouldn't change meds at this point since he has such dramatic improvement.  Would continue with current meds.  Recheck A1c in about 3 months.  We'll go from there.  I would expect improvement in his A1c.  Thanks.  Orders are in for labs prior to visit.

## 2014-04-07 NOTE — Telephone Encounter (Signed)
Patient notified as instructed by telephone. Patient stated that he already has a follow-up and lab appointment scheduled in November.

## 2014-04-09 ENCOUNTER — Other Ambulatory Visit: Payer: Self-pay | Admitting: Family Medicine

## 2014-04-14 ENCOUNTER — Other Ambulatory Visit: Payer: Self-pay | Admitting: Family Medicine

## 2014-06-11 ENCOUNTER — Other Ambulatory Visit (INDEPENDENT_AMBULATORY_CARE_PROVIDER_SITE_OTHER): Payer: BC Managed Care – PPO

## 2014-06-11 DIAGNOSIS — E119 Type 2 diabetes mellitus without complications: Secondary | ICD-10-CM

## 2014-06-11 LAB — HEMOGLOBIN A1C: HEMOGLOBIN A1C: 8.9 % — AB (ref 4.6–6.5)

## 2014-06-17 ENCOUNTER — Ambulatory Visit (HOSPITAL_BASED_OUTPATIENT_CLINIC_OR_DEPARTMENT_OTHER): Payer: BC Managed Care – PPO | Admitting: Oncology

## 2014-06-17 ENCOUNTER — Telehealth: Payer: Self-pay | Admitting: Oncology

## 2014-06-17 ENCOUNTER — Other Ambulatory Visit (HOSPITAL_BASED_OUTPATIENT_CLINIC_OR_DEPARTMENT_OTHER): Payer: BC Managed Care – PPO

## 2014-06-17 VITALS — BP 129/61 | HR 76 | Temp 98.3°F | Resp 19 | Ht 71.0 in | Wt 264.4 lb

## 2014-06-17 DIAGNOSIS — C180 Malignant neoplasm of cecum: Secondary | ICD-10-CM

## 2014-06-17 DIAGNOSIS — Z85038 Personal history of other malignant neoplasm of large intestine: Secondary | ICD-10-CM

## 2014-06-17 LAB — CEA: CEA: 0.6 ng/mL (ref 0.0–5.0)

## 2014-06-17 NOTE — Progress Notes (Signed)
  [place] OFFICE PROGRESS NOTE   Diagnosis: colon cancer  INTERVAL HISTORY:   Cory Harper returns as scheduled. He feels well. He has 5-6 bowel movements per day. He continues lower endoscopy follow-up with Dr. Marcello Moores.  Objective:  Vital signs in last 24 hours:  Blood pressure 129/61, pulse 76, temperature 98.3 F (36.8 C), temperature source Oral, resp. rate 19, height 5\' 11"  (1.803 m), weight 264 lb 6.4 oz (119.931 kg), SpO2 97 %.    HEENT: neck without mass Lymphatics: no cervical, supraclavicular, axillary, or inguinal nodes Resp: lungs clear bilaterally Cardio: regular rate and rhythm GI: no hepatomegaly, nontender, no mass Vascular: no leg edema  Skin:Oval cutaneous fullness measuring 1-2 cm at the left lower back    Lab Results  Component Value Date   [name] <0.5 12/17/2013    Medications: I have reviewed the patient's current medications.  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 Ileostomy takedown 09/18/2012.  2. Familial polyposis with multiple adenomatous polyps noted on the colectomy specimen 05/20/2012 -followed by Dr. Ardis Hughs and Dr. Marcello Moores for surveillance endoscopies.  3. 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  4. Diabetes.  5. Status post endoscopic removal of a duodenal polyp at [facility] on 06/08/2013.  6. Status post followup EGD at [facility] 10/19/2013.  7. Status post pouchoscopy by Dr. Marcello Moores 11/16/2013. Normal appearing J-pouch.    Disposition:  Cory Harper remains in clinical remission from colon cancer. He would like to continue follow-up at the Cancer center. He will return for an office visit and [name] in one year. The cutaneous nodule at the left flank region is likely a lipoma. He will contact us if this area  changes.  Betsy Coder, MD  06/17/2014  11:01 AM

## 2014-06-17 NOTE — Telephone Encounter (Signed)
gv adn printd appt sched and avs for pt for NOV 2016

## 2014-06-18 ENCOUNTER — Ambulatory Visit (INDEPENDENT_AMBULATORY_CARE_PROVIDER_SITE_OTHER): Payer: BC Managed Care – PPO | Admitting: Family Medicine

## 2014-06-18 ENCOUNTER — Encounter: Payer: Self-pay | Admitting: Family Medicine

## 2014-06-18 ENCOUNTER — Telehealth: Payer: Self-pay | Admitting: *Deleted

## 2014-06-18 VITALS — BP 128/76 | HR 75 | Temp 98.1°F | Wt 263.5 lb

## 2014-06-18 DIAGNOSIS — IMO0002 Reserved for concepts with insufficient information to code with codable children: Secondary | ICD-10-CM

## 2014-06-18 DIAGNOSIS — D509 Iron deficiency anemia, unspecified: Secondary | ICD-10-CM

## 2014-06-18 DIAGNOSIS — E119 Type 2 diabetes mellitus without complications: Secondary | ICD-10-CM

## 2014-06-18 DIAGNOSIS — E1165 Type 2 diabetes mellitus with hyperglycemia: Secondary | ICD-10-CM

## 2014-06-18 NOTE — Progress Notes (Signed)
Pre visit review using our clinic review tool, if applicable. No additional management support is needed unless otherwise documented below in the visit note.  Diabetes:  Using medications without difficulties:yes Hypoglycemic episodes:no Hyperglycemic episodes:no Feet problems:no Blood Sugars averaging: 80-120 recently, occ higher eye exam within last year: due, d/w pt.  Weight is down with more exercise and work on diet.  Sugar is improved.  A1c likely lags his progress.  He feels better overall.    Meds, vitals, and allergies reviewed.   ROS: See HPI.  Otherwise negative.    GEN: nad, alert and oriented HEENT: mucous membranes moist NECK: supple w/o LA CV: rrr. PULM: ctab, no inc wob ABD: soft, +bs EXT: no edema SKIN: no acute rash except for skin irritation on the R wrist (and he's going to try topical hydrocortisone)  Diabetic foot exam: Normal inspection No skin breakdown No calluses  Normal DP pulses Normal sensation to light touch and monofilament Nails normal

## 2014-06-18 NOTE — Telephone Encounter (Signed)
Pt returned call, [name] results given. He voiced understanding.

## 2014-06-18 NOTE — Telephone Encounter (Signed)
Left message on voicemail for pt to call office for lab results.

## 2014-06-18 NOTE — Patient Instructions (Addendum)
If you have low sugars, then stop the glimepiride and notify me.  Recheck in about 3 months, labs ahead of time. Call about an eye exam.  Use over the counter hydrocortisone on the rash and see if that helps.

## 2014-06-18 NOTE — Telephone Encounter (Signed)
-----   Message from Ladell Pier, MD sent at 06/17/2014  5:42 PM EST ----- Please call patient, cea is normal

## 2014-06-20 NOTE — Assessment & Plan Note (Signed)
Weight is down with more exercise and work on diet, encouraged patient.  Sugar is improved.  A1c likely lags his progress.  He feels better overall.   Continue as is. Recheck in a few months.  He agrees.

## 2014-07-05 LAB — HM DIABETES EYE EXAM

## 2014-07-16 ENCOUNTER — Other Ambulatory Visit: Payer: Self-pay | Admitting: Family Medicine

## 2014-07-27 ENCOUNTER — Encounter: Payer: Self-pay | Admitting: Family Medicine

## 2014-08-02 ENCOUNTER — Encounter: Payer: Self-pay | Admitting: Family Medicine

## 2014-08-16 ENCOUNTER — Other Ambulatory Visit: Payer: Self-pay | Admitting: Family Medicine

## 2014-09-17 ENCOUNTER — Other Ambulatory Visit: Payer: BC Managed Care – PPO

## 2014-09-21 ENCOUNTER — Ambulatory Visit: Payer: BC Managed Care – PPO | Admitting: Family Medicine

## 2014-09-30 ENCOUNTER — Other Ambulatory Visit: Payer: Self-pay | Admitting: Family Medicine

## 2014-10-05 DIAGNOSIS — Z7689 Persons encountering health services in other specified circumstances: Secondary | ICD-10-CM

## 2014-10-08 ENCOUNTER — Telehealth: Payer: Self-pay | Admitting: Family Medicine

## 2014-10-08 NOTE — Telephone Encounter (Signed)
I'll work on the hard copy.  Thanks.  

## 2014-10-08 NOTE — Telephone Encounter (Signed)
Pt dropped of DMV forms to be filled out. Please call pts cell phone when ready [phone]. Please leave message if no answer.

## 2014-10-28 ENCOUNTER — Other Ambulatory Visit (INDEPENDENT_AMBULATORY_CARE_PROVIDER_SITE_OTHER): Payer: BC Managed Care – PPO

## 2014-10-28 DIAGNOSIS — D509 Iron deficiency anemia, unspecified: Secondary | ICD-10-CM

## 2014-10-28 DIAGNOSIS — E119 Type 2 diabetes mellitus without complications: Secondary | ICD-10-CM

## 2014-10-28 LAB — CBC WITH DIFFERENTIAL/PLATELET
Basophils Absolute: 0.1 10*3/uL (ref 0.0–0.1)
Basophils Relative: 0.8 % (ref 0.0–3.0)
EOS ABS: 0.3 10*3/uL (ref 0.0–0.7)
Eosinophils Relative: 4.4 % (ref 0.0–5.0)
HCT: 36.1 % — ABNORMAL LOW (ref 39.0–52.0)
Hemoglobin: 12.1 g/dL — ABNORMAL LOW (ref 13.0–17.0)
Lymphocytes Relative: 26.7 % (ref 12.0–46.0)
Lymphs Abs: 1.6 10*3/uL (ref 0.7–4.0)
MCHC: 33.5 g/dL (ref 30.0–36.0)
MCV: 86.9 fl (ref 78.0–100.0)
MONO ABS: 0.4 10*3/uL (ref 0.1–1.0)
Monocytes Relative: 6.2 % (ref 3.0–12.0)
NEUTROS PCT: 61.9 % (ref 43.0–77.0)
Neutro Abs: 3.7 10*3/uL (ref 1.4–7.7)
PLATELETS: 227 10*3/uL (ref 150.0–400.0)
RBC: 4.16 Mil/uL — ABNORMAL LOW (ref 4.22–5.81)
RDW: 14.5 % (ref 11.5–15.5)
WBC: 5.9 10*3/uL (ref 4.0–10.5)

## 2014-10-28 LAB — HEMOGLOBIN A1C: HEMOGLOBIN A1C: 9.3 % — AB (ref 4.6–6.5)

## 2014-11-04 ENCOUNTER — Encounter: Payer: Self-pay | Admitting: Family Medicine

## 2014-11-04 ENCOUNTER — Ambulatory Visit (INDEPENDENT_AMBULATORY_CARE_PROVIDER_SITE_OTHER): Payer: BC Managed Care – PPO | Admitting: Family Medicine

## 2014-11-04 VITALS — BP 140/78 | HR 72 | Temp 98.3°F | Wt 263.5 lb

## 2014-11-04 DIAGNOSIS — E1165 Type 2 diabetes mellitus with hyperglycemia: Secondary | ICD-10-CM | POA: Diagnosis not present

## 2014-11-04 DIAGNOSIS — E119 Type 2 diabetes mellitus without complications: Secondary | ICD-10-CM | POA: Diagnosis not present

## 2014-11-04 DIAGNOSIS — IMO0002 Reserved for concepts with insufficient information to code with codable children: Secondary | ICD-10-CM

## 2014-11-04 DIAGNOSIS — D126 Benign neoplasm of colon, unspecified: Secondary | ICD-10-CM | POA: Diagnosis not present

## 2014-11-04 NOTE — Progress Notes (Signed)
Pre visit review using our clinic review tool, if applicable. No additional management support is needed unless otherwise documented below in the visit note.  He has f/u with [facility] pending re: J pouch check.  He had an duodenal adenoma recently removed, d/w pt.    Diabetes:  Using medications without difficulties: yes Hypoglycemic episodes: no Hyperglycemic episodes: no Feet problems: no Blood Sugars averaging: 125-140s in AM, not checked later in the day eye exam within last year: yes Exercising, on the bike daily.   He's needing to work more on exercise, and he acknowledges that. A1c still up, d/w pt.     Meds, vitals, and allergies reviewed.   ROS: See HPI.  Otherwise negative.    GEN: nad, alert and oriented HEENT: mucous membranes moist NECK: supple w/o LA CV: rrr. PULM: ctab, no inc wob ABD: soft, +bs EXT: no edema SKIN: no acute rash  Diabetic foot exam: Normal inspection No skin breakdown No calluses  Normal DP pulses Normal sensation to light touch and monofilament Nails normal

## 2014-11-04 NOTE — Patient Instructions (Signed)
Keep going with walking and biking.  Cut back on the juice and the crackers.  Recheck with labs before a visit in about 3 months.  Take care.  Glad to see you.

## 2014-11-04 NOTE — Assessment & Plan Note (Signed)
He realizes his A1c is up.  He is exercising daily.  No change in meds.  He can still make improvements in diet, and he'll work on that, recheck in about 3 months.  He agrees.

## 2014-11-29 ENCOUNTER — Ambulatory Visit (HOSPITAL_COMMUNITY)
Admission: RE | Admit: 2014-11-29 | Discharge: 2014-11-29 | Disposition: A | Discharge status: Home / Self Care | Payer: BC Managed Care – PPO | Source: Ambulatory Visit | Attending: General Surgery | Admitting: General Surgery

## 2014-11-29 ENCOUNTER — Encounter (HOSPITAL_COMMUNITY)
Admission: RE | Disposition: A | Discharge status: Home / Self Care | Payer: Self-pay | Source: Ambulatory Visit | Attending: General Surgery

## 2014-11-29 ENCOUNTER — Encounter (HOSPITAL_COMMUNITY): Payer: Self-pay

## 2014-11-29 DIAGNOSIS — Z87891 Personal history of nicotine dependence: Secondary | ICD-10-CM | POA: Insufficient documentation

## 2014-11-29 DIAGNOSIS — Z9049 Acquired absence of other specified parts of digestive tract: Secondary | ICD-10-CM | POA: Insufficient documentation

## 2014-11-29 DIAGNOSIS — E039 Hypothyroidism, unspecified: Secondary | ICD-10-CM | POA: Diagnosis not present

## 2014-11-29 DIAGNOSIS — E119 Type 2 diabetes mellitus without complications: Secondary | ICD-10-CM | POA: Insufficient documentation

## 2014-11-29 DIAGNOSIS — Z886 Allergy status to analgesic agent status: Secondary | ICD-10-CM | POA: Insufficient documentation

## 2014-11-29 DIAGNOSIS — K9289 Other specified diseases of the digestive system: Secondary | ICD-10-CM | POA: Diagnosis not present

## 2014-11-29 DIAGNOSIS — I10 Essential (primary) hypertension: Secondary | ICD-10-CM | POA: Insufficient documentation

## 2014-11-29 DIAGNOSIS — E785 Hyperlipidemia, unspecified: Secondary | ICD-10-CM | POA: Insufficient documentation

## 2014-11-29 DIAGNOSIS — Z85038 Personal history of other malignant neoplasm of large intestine: Secondary | ICD-10-CM | POA: Diagnosis not present

## 2014-11-29 HISTORY — PX: FLEXIBLE SIGMOIDOSCOPY: SHX5431

## 2014-11-29 LAB — GLUCOSE, CAPILLARY: GLUCOSE-CAPILLARY: 163 mg/dL — AB (ref 70–99)

## 2014-11-29 SURGERY — SIGMOIDOSCOPY, FLEXIBLE
Anesthesia: Moderate Sedation

## 2014-11-29 NOTE — Op Note (Signed)
[facility] [place], [address]   FLEXIBLE POUCHOSCOPY PROCEDURE REPORT  PATIENT: Cory Harper, Cory Harper  MR#: [id number] BIRTHDATE: June 11, 1956 , 59  yrs. old GENDER: male ENDOSCOPIST: [Referring Physician] REFERRED BY: PROCEDURE DATE:  11/29/2014 PROCEDURE:   Pouchoscopy INDICATIONS:high risk personal history of familial polyposis. MEDICATIONS: none  DESCRIPTION OF PROCEDURE:    Physical exam was performed.  Informed consent was obtained from the patient after explaining the benefits, risks, and alternatives to procedure.  The patient was connected to monitor and placed in left lateral position.  After rectal exam, the patients rectum was intubated and the flexible colonoscope was advanced under direct to the jejunum.  The scope was removed slowly by carefully examining the color, texture, anatomy, and integrity mucosa on the way out.  The patient was recovered in endoscopy and discharged home in satisfactory condition.       COLON FINDINGS: Normal exam.  No strictures or polyps identified.   PREP QUALITY: The overall prep quality was good.  COMPLICATIONS: None  ENDOSCOPIC IMPRESSION: Normal exam.  No strictures or polyps identified  RECOMMENDATIONS: Repeat exam in 1 year.  If normal again next year, can probably switch to every other year testing    standard discharge  _______________________________ eSigned:  Rosario Adie, MD 73/[id number]/7014 1:50 PM   CPT CODES: ICD CODES:  The ICD and CPT codes recommended by this software are interpretations from the data that the clinical staff has captured with the software.  The verification of the translation of this report to the ICD and CPT codes and modifiers is the sole responsibility of the health care institution and practicing physician where this report was generated.  [place]. will not be held responsible for the validity of the ICD and CPT codes included on  this report.  AMA assumes no liability for data contained or not contained herein. CPT is a Designer, television/film set of the [employer].

## 2014-11-29 NOTE — H&P (Signed)
Cory Harper is an 59 y.o. male.  Chief Complaint: FAP HPI: Pt is s/p total proctocolectomy for FAP. He is here for surveillance of his pouch. He denies any problems with it. He is s/p a normal upper EGD after having polypectomy the year before.   Past Medical History  Diagnosis Date  . Diabetes mellitus   . Nasal congestion   . Colonic mass   . Hyperlipidemia   . Heart murmur   . Cancer     stg II colon cancer s/p resection  . Arthritis   . Hypertension     not on blood pressure meds since 11/13   . Hypothyroidism   . FAP (familial adenomatous polyposis)   . Anemia     Past Surgical History  Procedure Laterality Date  . Knee arthroscopy  1998    Right knee  . Upper gastrointestinal endoscopy    . Wisdom tooth extraction    . Colon resection  05/20/2012    Procedure: COLON RESECTION LAPAROSCOPIC; Surgeon: Adin Hector, MD; Location: [facility]; Service: General; Laterality: N/A; Laparoscopic Proctocolectomy, Ileal Pouch Anal Anastomoisis, Loop Ileostomy  . Bowel resection  05/20/2012  . Thyroid lobectomy  05/20/2012    Procedure: THYROID LOBECTOMY; Surgeon: Adin Hector, MD; Location: [facility]; Service: General; Laterality: Left; LEFT THYROID LOBECTOMY  . Ileostomy  05/20/12  . Flexible sigmoidoscopy  08/01/2012    Procedure: FLEXIBLE SIGMOIDOSCOPY; Surgeon: Leighton Ruff, MD; Location: [facility]; Service: Endoscopy; Laterality: N/A; use endoscope  . Ileostomy closure N/A 09/18/2012    Procedure: Loop Ileostomy Takedown with EUA ; Surgeon: Adin Hector, MD; Location: [facility]; Service: General; Laterality: N/A; loop ileostomy takedown with EUA   . Esophagogastroduodenoscopy (egd) with propofol N/A 04/23/2013    Procedure: ESOPHAGOGASTRODUODENOSCOPY (EGD) WITH PROPOFOL; Surgeon: Milus Banister, MD; Location: [facility]; Service:  Endoscopy; Laterality: N/A; side viewing scope    Family History  Problem Relation Age of Onset  . Uterine cancer Mother   . Hypertension Mother   . Stroke Mother   . Colon cancer Mother   . Colon polyps Mother   . Cancer Mother     colon, endometrial  . Diabetes Father   . Obesity Father   . Pneumonia Father   . Cancer Father     skin - squamous   . Diabetes Paternal Grandmother   . Prostate cancer Neg Hx    Social History:  reports that he quit smoking about 22 years ago. He has never used smokeless tobacco. He reports that he does not drink alcohol or use illicit drugs.  Allergies:  Allergies  Allergen Reactions  . Ibuprofen Swelling    Made his joints swell Joint swelling    Medications Prior to Admission  Medication Sig Dispense Refill  . cholecalciferol (VITAMIN D) 1000 UNITS tablet Take 1,000 Units by mouth daily.     . ferrous fumarate (HEMOCYTE - 106 MG FE) 325 (106 FE) MG TABS tablet Take 1 tablet by mouth 2 (two) times daily.    . fluticasone (FLONASE) 50 MCG/ACT nasal spray Place 2 sprays into the nose daily.    Marland Kitchen glimepiride (AMARYL) 2 MG tablet Take 2 mg by mouth 2 (two) times daily.     Marland Kitchen levothyroxine (SYNTHROID, LEVOTHROID) 100 MCG tablet take 1 tablet by mouth every morning 30 tablet 5  . loperamide (IMODIUM A-D) 2 MG tablet Take 2-4 mg by mouth 2 (two) times daily as needed for diarrhea or loose stools (Goal is 4-6  BMs / day). For diarrhea    . loratadine (CLARITIN) 10 MG tablet Take 10 mg by mouth daily.     . metFORMIN (GLUCOPHAGE) 1000 MG tablet Take 1,000 mg by mouth 2 (two) times daily with a meal.    . ONGLYZA 5 MG TABS tablet take 1 tablet by mouth once daily WITH DINNER 30 tablet 5  . rosuvastatin (CRESTOR) 10 MG tablet Take 10 mg by mouth at bedtime.    . vitamin B-12 (CYANOCOBALAMIN) 500 MCG  tablet Take 500 mcg by mouth daily.    Marland Kitchen aspirin 81 MG tablet Take 81 mg by mouth daily.    . pantoprazole (PROTONIX) 40 MG tablet Take 40 mg by mouth daily.                Review of Systems  Constitutional: Negative for fever and chills.  Eyes: Negative for blurred vision.  Respiratory: Negative for cough.  Cardiovascular: Negative for chest pain.  Gastrointestinal: Negative for nausea, vomiting, abdominal pain and blood in stool.  Genitourinary: Negative for dysuria, urgency and frequency.  Musculoskeletal: Negative for myalgias.  Skin: Negative for rash.  Neurological: Negative for headaches.   There were no vitals taken for this visit.  Physical Exam  Constitutional: He is oriented to person, place, and time. He appears well-developed and well-nourished.  HENT:  Head: Normocephalic and atraumatic.  Eyes: Pupils are equal, round, and reactive to light.  Neck: Normal range of motion.  Cardiovascular: Normal rate and regular rhythm.  Respiratory: Effort normal and breath sounds normal.  GI: Soft. Bowel sounds are normal. He exhibits no distension. There is no tenderness.  Musculoskeletal: Normal range of motion.  Neurological: He is alert and oriented to person, place, and time.  Skin: Skin is warm and dry.     Assessment/Plan Will do pouchoscopy for screening purposes today. Risks and benefits explained. Risks include bleeding, perforation and discomfort. Pt agrees.

## 2014-11-30 ENCOUNTER — Encounter (HOSPITAL_COMMUNITY): Payer: Self-pay | Admitting: General Surgery

## 2015-01-31 ENCOUNTER — Other Ambulatory Visit: Payer: BC Managed Care – PPO

## 2015-02-03 ENCOUNTER — Ambulatory Visit: Payer: BC Managed Care – PPO | Admitting: Family Medicine

## 2015-02-09 ENCOUNTER — Other Ambulatory Visit: Payer: Self-pay | Admitting: Family Medicine

## 2015-02-15 ENCOUNTER — Other Ambulatory Visit (INDEPENDENT_AMBULATORY_CARE_PROVIDER_SITE_OTHER): Payer: BC Managed Care – PPO

## 2015-02-15 DIAGNOSIS — E119 Type 2 diabetes mellitus without complications: Secondary | ICD-10-CM | POA: Diagnosis not present

## 2015-02-15 LAB — HEMOGLOBIN A1C: Hgb A1c MFr Bld: 9.2 % — ABNORMAL HIGH (ref 4.6–6.5)

## 2015-02-18 ENCOUNTER — Encounter: Payer: Self-pay | Admitting: Family Medicine

## 2015-02-18 ENCOUNTER — Ambulatory Visit (INDEPENDENT_AMBULATORY_CARE_PROVIDER_SITE_OTHER): Payer: BC Managed Care – PPO | Admitting: Family Medicine

## 2015-02-18 VITALS — BP 142/70 | HR 68 | Temp 98.3°F | Wt 264.2 lb

## 2015-02-18 DIAGNOSIS — IMO0002 Reserved for concepts with insufficient information to code with codable children: Secondary | ICD-10-CM

## 2015-02-18 DIAGNOSIS — E119 Type 2 diabetes mellitus without complications: Secondary | ICD-10-CM

## 2015-02-18 DIAGNOSIS — E1165 Type 2 diabetes mellitus with hyperglycemia: Secondary | ICD-10-CM | POA: Diagnosis not present

## 2015-02-18 LAB — MICROALBUMIN / CREATININE URINE RATIO
Creatinine,U: 30.6 mg/dL
Microalb Creat Ratio: 2.3 mg/g (ref 0.0–30.0)

## 2015-02-18 NOTE — Progress Notes (Signed)
Pre visit review using our clinic review tool, if applicable. No additional management support is needed unless otherwise documented below in the visit note.  Diabetes:  Using medications without difficulties: yes Hypoglycemic episodes:no Hyperglycemic episodes:no Feet problems:no Blood Sugars averaging: usually 150s, occ higher, occ lower.  Sugar is lower with more exercise and he realizes that.  eye exam within last year: yes A1c similar to prev, d/w pt.   Diet is fairly good but he needs to work more on exercise.  Diet is complicated by his GI sx.  He is trying to work back into more salads and greens.    Meds, vitals, and allergies reviewed.   ROS: See HPI.  Otherwise negative.    GEN: nad, alert and oriented HEENT: mucous membranes moist NECK: supple w/o LA CV: rrr. PULM: ctab, no inc wob ABD: soft, +bs EXT: no edema SKIN: no acute rash  Diabetic foot exam: Normal inspection No skin breakdown No calluses  Normal DP pulses Normal sensation to light touch and monofilament Nails normal

## 2015-02-18 NOTE — Assessment & Plan Note (Signed)
A1c similar to prev, d/w pt.  A1c still up.   Diet is fairly good but he needs to work more on exercise.  Diet is complicated by his GI sx.  He is trying to work back into more salads and greens.   No change in meds. He'll work more on weight, diet and exercise, recheck in a few months.   Not on ACE now, will check MALB today.  He agrees.  Okay for outpatient f/u.

## 2015-02-18 NOTE — Patient Instructions (Addendum)
Try to work back into greens gradually.  Try to keep exercising.  Recheck labs in about 3-4 months before a physical.  Thanks for your effort.  Glad to see you.  Go to the lab on the way out.  We'll contact you with your lab report.

## 2015-02-21 ENCOUNTER — Telehealth: Payer: Self-pay | Admitting: Family Medicine

## 2015-02-21 NOTE — Telephone Encounter (Signed)
Pt returned your call about lab results. I informed pt of Dr. Carole Civil comments and pt verbalized understanding. Pt has cpe + labs in Nov 2016.

## 2015-04-03 ENCOUNTER — Other Ambulatory Visit: Payer: Self-pay | Admitting: Family Medicine

## 2015-04-06 ENCOUNTER — Telehealth: Payer: Self-pay

## 2015-04-06 NOTE — Telephone Encounter (Signed)
Patient notified as instructed by telephone and verbalized understanding. 

## 2015-04-06 NOTE — Telephone Encounter (Signed)
Name brand vs generic on that med should be fine.  Thanks.

## 2015-04-06 NOTE — Telephone Encounter (Signed)
Pt left v/m; [facility] [address] changed pt from name brand crestor to generic;pt wants Dr Josefine Class opinion if generic crestor is as good as name brand. Dr Council Mechanic had previously advised pt name brand was better. Pt request cb.

## 2015-04-06 NOTE — Telephone Encounter (Signed)
Left message on voice mail  to call back

## 2015-04-14 ENCOUNTER — Other Ambulatory Visit: Payer: Self-pay | Admitting: Family Medicine

## 2015-04-20 ENCOUNTER — Ambulatory Visit: Payer: BC Managed Care – PPO

## 2015-06-12 ENCOUNTER — Other Ambulatory Visit: Payer: Self-pay | Admitting: Family Medicine

## 2015-06-12 DIAGNOSIS — E1165 Type 2 diabetes mellitus with hyperglycemia: Principal | ICD-10-CM

## 2015-06-12 DIAGNOSIS — C180 Malignant neoplasm of cecum: Secondary | ICD-10-CM

## 2015-06-12 DIAGNOSIS — E538 Deficiency of other specified B group vitamins: Secondary | ICD-10-CM

## 2015-06-12 DIAGNOSIS — IMO0001 Reserved for inherently not codable concepts without codable children: Secondary | ICD-10-CM

## 2015-06-14 ENCOUNTER — Other Ambulatory Visit (INDEPENDENT_AMBULATORY_CARE_PROVIDER_SITE_OTHER): Payer: BC Managed Care – PPO

## 2015-06-14 DIAGNOSIS — E538 Deficiency of other specified B group vitamins: Secondary | ICD-10-CM

## 2015-06-14 DIAGNOSIS — IMO0001 Reserved for inherently not codable concepts without codable children: Secondary | ICD-10-CM

## 2015-06-14 DIAGNOSIS — C180 Malignant neoplasm of cecum: Secondary | ICD-10-CM | POA: Diagnosis not present

## 2015-06-14 DIAGNOSIS — E1165 Type 2 diabetes mellitus with hyperglycemia: Secondary | ICD-10-CM | POA: Diagnosis not present

## 2015-06-14 LAB — CBC WITH DIFFERENTIAL/PLATELET
Basophils Absolute: 0 K/uL (ref 0.0–0.1)
Basophils Relative: 0.7 % (ref 0.0–3.0)
Eosinophils Absolute: 0.2 K/uL (ref 0.0–0.7)
Eosinophils Relative: 3.9 % (ref 0.0–5.0)
HCT: 39.3 % (ref 39.0–52.0)
Hemoglobin: 13 g/dL (ref 13.0–17.0)
Lymphocytes Relative: 29.3 % (ref 12.0–46.0)
Lymphs Abs: 1.7 K/uL (ref 0.7–4.0)
MCHC: 33 g/dL (ref 30.0–36.0)
MCV: 88.7 fl (ref 78.0–100.0)
Monocytes Absolute: 0.4 K/uL (ref 0.1–1.0)
Monocytes Relative: 7.1 % (ref 3.0–12.0)
Neutro Abs: 3.5 K/uL (ref 1.4–7.7)
Neutrophils Relative %: 59 % (ref 43.0–77.0)
Platelets: 207 K/uL (ref 150.0–400.0)
RBC: 4.43 Mil/uL (ref 4.22–5.81)
RDW: 15 % (ref 11.5–15.5)
WBC: 5.9 K/uL (ref 4.0–10.5)

## 2015-06-14 LAB — LIPID PANEL
Cholesterol: 112 mg/dL (ref 0–200)
HDL: 36.2 mg/dL — ABNORMAL LOW
LDL Cholesterol: 42 mg/dL (ref 0–99)
NonHDL: 75.36
Total CHOL/HDL Ratio: 3
Triglycerides: 165 mg/dL — ABNORMAL HIGH (ref 0.0–149.0)
VLDL: 33 mg/dL (ref 0.0–40.0)

## 2015-06-14 LAB — VITAMIN B12: Vitamin B-12: 890 pg/mL (ref 211–911)

## 2015-06-14 LAB — COMPREHENSIVE METABOLIC PANEL
ALBUMIN: 4 g/dL (ref 3.5–5.2)
ALK PHOS: 59 U/L (ref 39–117)
ALT: 19 U/L (ref 0–53)
AST: 21 U/L (ref 0–37)
BILIRUBIN TOTAL: 0.7 mg/dL (ref 0.2–1.2)
BUN: 23 mg/dL (ref 6–23)
CALCIUM: 9.8 mg/dL (ref 8.4–10.5)
CO2: 28 mEq/L (ref 19–32)
CREATININE: 1.22 mg/dL (ref 0.40–1.50)
Chloride: 102 mEq/L (ref 96–112)
GFR: 64.87 mL/min (ref >60.00)
Glucose, Bld: 118 mg/dL — ABNORMAL HIGH (ref 70–99)
Potassium: 4.3 mEq/L (ref 3.5–5.1)
SODIUM: 138 meq/L (ref 135–145)
TOTAL PROTEIN: 7.1 g/dL (ref 6.0–8.3)

## 2015-06-14 LAB — TSH: TSH: 1.65 u[IU]/mL (ref 0.35–4.50)

## 2015-06-14 LAB — HEMOGLOBIN A1C: Hgb A1c MFr Bld: 9.7 % — ABNORMAL HIGH (ref 4.6–6.5)

## 2015-06-15 LAB — CEA: CEA: <0.5 ng/mL (ref 0.0–5.0)

## 2015-06-16 ENCOUNTER — Ambulatory Visit (HOSPITAL_BASED_OUTPATIENT_CLINIC_OR_DEPARTMENT_OTHER): Payer: BC Managed Care – PPO | Admitting: Oncology

## 2015-06-16 ENCOUNTER — Telehealth: Payer: Self-pay | Admitting: Oncology

## 2015-06-16 ENCOUNTER — Other Ambulatory Visit (HOSPITAL_BASED_OUTPATIENT_CLINIC_OR_DEPARTMENT_OTHER): Payer: BC Managed Care – PPO

## 2015-06-16 VITALS — BP 137/60 | HR 65 | Temp 97.9°F | Resp 20 | Ht 71.0 in | Wt 256.8 lb

## 2015-06-16 DIAGNOSIS — C180 Malignant neoplasm of cecum: Secondary | ICD-10-CM | POA: Diagnosis not present

## 2015-06-16 LAB — CEA: CEA: <0.5 ng/mL (ref 0.0–5.0)

## 2015-06-16 NOTE — Progress Notes (Signed)
  [place] OFFICE PROGRESS NOTE   Diagnosis:  Colon cancer  INTERVAL HISTORY:    Mr. Cetina returns as scheduled. He feels well. No new complaint.  Objective:  Vital signs in last 24 hours:  Blood pressure 137/60, pulse 65, temperature 97.9 F (36.6 C), temperature source Oral, resp. rate 20, height 5\' 11"  (1.803 m), weight 256 lb 12.8 oz (116.484 kg), SpO2 100 %.    HEENT:  Neck without mass Lymphatics:  No cervical, supra-clavicular, or axillary nodes Resp:  Lungs clear bilaterally Cardio:  Regular rate and rhythm GI:  No hepatomegaly, no mass, nontender Vascular:  No leg edema  Skin: yeast rash in the groin bilaterally     Lab Results:  Lab Results  Component Value Date   WBC 5.9 06/14/2015   HGB 13.0 06/14/2015   HCT 39.3 06/14/2015   MCV 88.7 06/14/2015   PLT 207.0 06/14/2015   NEUTROABS 3.5 06/14/2015      Lab Results  Component Value Date   [name] <0.5 06/14/2015    Medications: I have reviewed the patient's current medications.  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 Ileostomy takedown 09/18/2012.  2. Familial polyposis with multiple adenomatous polyps noted on the colectomy specimen 05/20/2012 -followed  At [facility] and  by Dr. Marcello Moores for surveillance endoscopies.  3. 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  4. Diabetes.  5. Status post endoscopic removal of a duodenal polyp at [facility] on 06/08/2013 and 10/25/2014      Disposition:   Mr. Devon remains in clinical remission from colon cancer. He would like to continue follow-up at the [facility]. He will return for an office visit and [name] in one year.  Betsy Coder, MD  06/16/2015  9:45 AM

## 2015-06-16 NOTE — Telephone Encounter (Signed)
per pof to sch pt appt-gave pt copy of avs °

## 2015-06-21 ENCOUNTER — Encounter: Payer: Self-pay | Admitting: Family Medicine

## 2015-06-21 ENCOUNTER — Ambulatory Visit (INDEPENDENT_AMBULATORY_CARE_PROVIDER_SITE_OTHER): Payer: BC Managed Care – PPO | Admitting: Family Medicine

## 2015-06-21 VITALS — BP 118/60 | HR 96 | Temp 98.4°F | Ht 70.0 in | Wt 253.0 lb

## 2015-06-21 DIAGNOSIS — Z0000 Encounter for general adult medical examination without abnormal findings: Secondary | ICD-10-CM | POA: Diagnosis not present

## 2015-06-21 DIAGNOSIS — E119 Type 2 diabetes mellitus without complications: Secondary | ICD-10-CM

## 2015-06-21 DIAGNOSIS — E1165 Type 2 diabetes mellitus with hyperglycemia: Secondary | ICD-10-CM

## 2015-06-21 DIAGNOSIS — Z119 Encounter for screening for infectious and parasitic diseases, unspecified: Secondary | ICD-10-CM

## 2015-06-21 DIAGNOSIS — Z7189 Other specified counseling: Secondary | ICD-10-CM

## 2015-06-21 DIAGNOSIS — E039 Hypothyroidism, unspecified: Secondary | ICD-10-CM

## 2015-06-21 DIAGNOSIS — IMO0001 Reserved for inherently not codable concepts without codable children: Secondary | ICD-10-CM

## 2015-06-21 LAB — HM DIABETES FOOT EXAM

## 2015-06-21 NOTE — Progress Notes (Signed)
Pre visit review using our clinic review tool, if applicable. No additional management support is needed unless otherwise documented below in the visit note.  CPE- See plan.  Routine anticipatory guidance given to patient.  See health maintenance. Tetanus 2009 Flu done at pharmacy in 04/2015 PNA 2014 Shingles not due, d/w pt.  Colon cancer screening done 2016.   Prostate cancer screening and PSA options (with potential risks and benefits of testing vs not testing) were discussed along with recent recs/guidelines.  He declined testing PSA at this point. Living will d/w pt.  Wife designated if patient were incapacitated.   Diet and exercise d/w pt.  Pt opts in for HCV and HIV screening with next set of labs.  D/w pt re: routine screening.   Hypothyroid.  No ADE on med.  No neck swelling.  Compliant.  TSH wnl.    Diabetes:  Using medications without difficulties:no Hypoglycemic episodes:no Hyperglycemic episodes: no Feet problems: no Blood Sugars averaging: up to 190 eye exam within last year:  Due soon, d/w pt.   A1c up.  Had been off diet.  D/w pt.   He is on the bike most days for exercise.   PMH and SH reviewed  Meds, vitals, and allergies reviewed.   ROS: See HPI.  Otherwise negative.    GEN: nad, alert and oriented HEENT: mucous membranes moist NECK: supple w/o LA CV: rrr. PULM: ctab, no inc wob ABD: soft, +bs EXT: no edema SKIN: no acute rash  Diabetic foot exam: Normal inspection No skin breakdown No calluses  Normal DP pulses Normal sensation to light touch and monofilament Nails normal

## 2015-06-21 NOTE — Patient Instructions (Addendum)
Check with your insurance and see about cheaper options if a med isn't preferred (including you strips) and see about substitutions.   Call about an eye exam.  Work on your diet and recheck labs in about 3 months before a visit.   Take care.  Glad to see you.

## 2015-06-22 DIAGNOSIS — Z0000 Encounter for general adult medical examination without abnormal findings: Secondary | ICD-10-CM | POA: Insufficient documentation

## 2015-06-22 DIAGNOSIS — Z7189 Other specified counseling: Secondary | ICD-10-CM | POA: Insufficient documentation

## 2015-06-22 NOTE — Assessment & Plan Note (Signed)
Tetanus 2009 Flu done at pharmacy in 04/2015 PNA 2014 Shingles not due, d/w pt.  Colon cancer screening done 2016.   Prostate cancer screening and PSA options (with potential risks and benefits of testing vs not testing) were discussed along with recent recs/guidelines.  He declined testing PSA at this point. Living will d/w pt.  Wife designated if patient were incapacitated.   Diet and exercise d/w pt.  Pt opts in for HCV and HIV screening with next set of labs.  D/w pt re: routine screening.

## 2015-06-22 NOTE — Assessment & Plan Note (Signed)
tsh wnl, no neck mass, continue as is.

## 2015-06-22 NOTE — Assessment & Plan Note (Signed)
Med options limited by work.  Still okay for outpatient f/u.  He doesn't have sx that would limit activity.   Weight loss is the issue here.  He'll work on diet and weight, recheck in a few months.

## 2015-06-24 ENCOUNTER — Telehealth: Payer: Self-pay | Admitting: Family Medicine

## 2015-06-24 NOTE — Telephone Encounter (Signed)
Pt brought in ppw he discuss with Dr. Damita Dunnings regarding his rx and the insurance. The letter states that the provider needs to contact them regarding the change. Best number to reach pt is [phone]. Placing ppw from pt in Dr. Josefine Class tower.

## 2015-06-24 NOTE — Telephone Encounter (Signed)
Placed paperwork in Dr Josefine Class inbox

## 2015-06-27 NOTE — Telephone Encounter (Signed)
He had questions about coverage from [place] strips, crestor and onglyza starting in 2017.  crestor has gone generic, so that shouldn't be an issue but we can change if needed.  The form mentions a preferred drug list.  If he'll get a copy of that, then I'll check for substitutions that are reasonable.  If none exist, then we can work on the [place].  Thanks.

## 2015-06-27 NOTE — Telephone Encounter (Signed)
Left detailed message on voicemail.  

## 2015-06-28 NOTE — Telephone Encounter (Signed)
Pt dropped off preferred drug list. List in Dr. Josefine Class RX in-box. Thank you.  CB# [phone]

## 2015-06-30 MED ORDER — ONETOUCH ULTRA SYSTEM W/DEVICE KIT: PACK | Status: AC

## 2015-06-30 MED ORDER — ATORVASTATIN CALCIUM 20 MG PO TABS: 20.0000 mg | ORAL_TABLET | Freq: Every day | ORAL | Status: DC

## 2015-06-30 MED ORDER — SITAGLIPTIN PHOSPHATE 100 MG PO TABS: 100.0000 mg | ORAL_TABLET | Freq: Every day | ORAL | Status: DC

## 2015-06-30 NOTE — Telephone Encounter (Signed)
Change onglyza to [place].  Change crestor to atorva.  Change to one touch ultra strips and kit.  rxs printed.  Thanks.

## 2015-07-02 ENCOUNTER — Other Ambulatory Visit: Payer: Self-pay | Admitting: Family Medicine

## 2015-07-04 NOTE — Telephone Encounter (Signed)
Left detailed message on voicemail. Rx left at front desk for pick up.  

## 2015-07-07 ENCOUNTER — Telehealth: Payer: Self-pay

## 2015-07-07 NOTE — Telephone Encounter (Signed)
I doubt that insurance company will approve crestor over lipitor if patient hasn't been on lipitor, even with the family hx noted.  I get his point, but I doubt he'll get coverage for crestor w/o documented trial and intolerance to lipitor.  I would try the lipitor.  If he has aches, we can appeal it.

## 2015-07-07 NOTE — Telephone Encounter (Signed)
Pt concerned about taking atorvastatin due to hx of family members having problem with leg pain. Pt has not got atorvastatin filled yet. Generic Crestor is working and wants to stay on Crestor. Pt wants ins co. Contacted to see if approval can be done to stay on generic crestor. Pt is out of crestor starting tonight. Pt said Dr Damita Dunnings should have copy of letter about getting Crestor approved. Pt request cb today. Lynn.

## 2015-07-08 NOTE — Telephone Encounter (Signed)
Pt expressed understanding as we really need to show that he has tried and failed other statins before requesting override--pt states he does not like it but will get medication

## 2015-08-04 ENCOUNTER — Other Ambulatory Visit: Payer: Self-pay | Admitting: Family Medicine

## 2015-08-05 LAB — HM DIABETES EYE EXAM

## 2015-08-16 ENCOUNTER — Telehealth: Payer: Self-pay | Admitting: *Deleted

## 2015-08-16 NOTE — Telephone Encounter (Signed)
Fax received concerning alternative medication request.  Form in Dr. Buckner [place] In [place].

## 2015-08-17 NOTE — Telephone Encounter (Signed)
Form on Cory Harper's desk. Left message for patient to call back.

## 2015-08-17 NOTE — Telephone Encounter (Signed)
Not on onglyza per EMR.   Already on januvia.  Please clarify this.  Thanks.

## 2015-08-18 NOTE — Telephone Encounter (Signed)
Pt returned you call best number [phone]

## 2015-08-18 NOTE — Telephone Encounter (Signed)
Spoke with patient.  He is not on Onglyza.  Was switched to Januvia earlier due to insurance coverage.  Faxed this info back to pharmacy.

## 2015-08-25 ENCOUNTER — Encounter: Payer: Self-pay | Admitting: Family Medicine

## 2015-09-12 ENCOUNTER — Other Ambulatory Visit: Payer: Self-pay | Admitting: *Deleted

## 2015-09-12 MED ORDER — LEVOTHYROXINE SODIUM 100 MCG PO TABS: 100.0000 ug | ORAL_TABLET | Freq: Every morning | ORAL | Status: DC

## 2015-09-14 ENCOUNTER — Other Ambulatory Visit (INDEPENDENT_AMBULATORY_CARE_PROVIDER_SITE_OTHER): Payer: BC Managed Care – PPO

## 2015-09-14 ENCOUNTER — Other Ambulatory Visit: Payer: Self-pay | Admitting: Family Medicine

## 2015-09-14 DIAGNOSIS — Z119 Encounter for screening for infectious and parasitic diseases, unspecified: Secondary | ICD-10-CM

## 2015-09-14 DIAGNOSIS — E119 Type 2 diabetes mellitus without complications: Secondary | ICD-10-CM | POA: Diagnosis not present

## 2015-09-14 LAB — HEMOGLOBIN A1C: HEMOGLOBIN A1C: 9.3 % — AB (ref 4.6–6.5)

## 2015-09-15 LAB — HEPATITIS C ANTIBODY: HCV AB: NEGATIVE

## 2015-09-15 LAB — HIV ANTIBODY (ROUTINE TESTING W REFLEX): HIV 1&2 Ab, 4th Generation: NONREACTIVE

## 2015-09-20 ENCOUNTER — Ambulatory Visit: Payer: BC Managed Care – PPO | Admitting: Family Medicine

## 2015-09-23 ENCOUNTER — Ambulatory Visit (INDEPENDENT_AMBULATORY_CARE_PROVIDER_SITE_OTHER): Payer: BC Managed Care – PPO | Admitting: Family Medicine

## 2015-09-23 ENCOUNTER — Encounter: Payer: Self-pay | Admitting: Family Medicine

## 2015-09-23 VITALS — BP 122/68 | HR 66 | Temp 98.6°F | Wt 253.0 lb

## 2015-09-23 DIAGNOSIS — E1165 Type 2 diabetes mellitus with hyperglycemia: Secondary | ICD-10-CM | POA: Diagnosis not present

## 2015-09-23 DIAGNOSIS — IMO0001 Reserved for inherently not codable concepts without codable children: Secondary | ICD-10-CM

## 2015-09-23 NOTE — Patient Instructions (Signed)
Keep exercising- try to add on more walking.  Try more salads in the meantime and cut some sweets.  No med changes.  Recheck in about 3 months, labs ahead of time.  Take care.  Glad to see you.

## 2015-09-23 NOTE — Progress Notes (Signed)
Pre visit review using our clinic review tool, if applicable. No additional management support is needed unless otherwise documented below in the visit note.  Diabetes:  Using medications without difficulties: yes Hypoglycemic episodes:no Hyperglycemic episodes:no Feet problems:no Blood Sugars averaging: up to 150s in the AM, better with diet adherence.   eye exam within last year: yes a1c still up, but some better.  Still on exercise bike.   D/w pt about diet and cutting carbs.   He can't use injection meds.   He knows his diet is the main issue.  We talked about invokana, as a possibility.  It is more reasonable to work on diet than start a new med.  D/w pt.  He agrees.    Meds, vitals, and allergies reviewed.   ROS: See HPI.  Otherwise negative.    GEN: nad, alert and oriented HEENT: mucous membranes moist NECK: supple w/o LA CV: rrr. PULM: ctab, no inc wob ABD: soft, +bs EXT: no edema  Diabetic foot exam: Normal inspection No skin breakdown No calluses  Normal DP pulses Normal sensation to light touch and monofilament Nails normal

## 2015-09-26 NOTE — Assessment & Plan Note (Signed)
Not controlled.  A1c still up, but some better.  Still on exercise bike but he can do more.   D/w pt about diet and cutting carbs.  He can't use injection meds with his job.   He knows his diet is the main issue.  We talked about invokana, as a possibility. It is more reasonable to work on diet than start a new med. D/w pt. He agrees.  Recheck in 3 months.

## 2015-10-06 ENCOUNTER — Other Ambulatory Visit: Payer: Self-pay | Admitting: General Surgery

## 2015-11-01 ENCOUNTER — Telehealth: Payer: Self-pay

## 2015-11-01 MED ORDER — FLUTICASONE PROPIONATE 50 MCG/ACT NA SUSP: 2.0000 | Freq: Every day | NASAL | Status: DC

## 2015-11-01 NOTE — Telephone Encounter (Signed)
Rx sent electronically.  

## 2015-11-16 ENCOUNTER — Encounter (HOSPITAL_COMMUNITY)
Admission: RE | Disposition: A | Discharge status: Home / Self Care | Payer: Self-pay | Source: Ambulatory Visit | Attending: General Surgery

## 2015-11-16 ENCOUNTER — Encounter (HOSPITAL_COMMUNITY): Payer: Self-pay

## 2015-11-16 ENCOUNTER — Ambulatory Visit (HOSPITAL_COMMUNITY)
Admission: RE | Admit: 2015-11-16 | Discharge: 2015-11-16 | Disposition: A | Discharge status: Home / Self Care | Payer: BC Managed Care – PPO | Source: Ambulatory Visit | Attending: General Surgery | Admitting: General Surgery

## 2015-11-16 DIAGNOSIS — Z86010 Personal history of colonic polyps: Secondary | ICD-10-CM | POA: Diagnosis not present

## 2015-11-16 DIAGNOSIS — I10 Essential (primary) hypertension: Secondary | ICD-10-CM | POA: Diagnosis not present

## 2015-11-16 DIAGNOSIS — Z79899 Other long term (current) drug therapy: Secondary | ICD-10-CM | POA: Insufficient documentation

## 2015-11-16 DIAGNOSIS — Z7982 Long term (current) use of aspirin: Secondary | ICD-10-CM | POA: Insufficient documentation

## 2015-11-16 DIAGNOSIS — M1990 Unspecified osteoarthritis, unspecified site: Secondary | ICD-10-CM | POA: Insufficient documentation

## 2015-11-16 DIAGNOSIS — Z7984 Long term (current) use of oral hypoglycemic drugs: Secondary | ICD-10-CM | POA: Diagnosis not present

## 2015-11-16 DIAGNOSIS — Z7951 Long term (current) use of inhaled steroids: Secondary | ICD-10-CM | POA: Diagnosis not present

## 2015-11-16 DIAGNOSIS — Z87891 Personal history of nicotine dependence: Secondary | ICD-10-CM | POA: Diagnosis not present

## 2015-11-16 DIAGNOSIS — Z980 Intestinal bypass and anastomosis status: Secondary | ICD-10-CM | POA: Insufficient documentation

## 2015-11-16 DIAGNOSIS — Z9049 Acquired absence of other specified parts of digestive tract: Secondary | ICD-10-CM | POA: Diagnosis not present

## 2015-11-16 DIAGNOSIS — Z434 Encounter for attention to other artificial openings of digestive tract: Secondary | ICD-10-CM | POA: Diagnosis present

## 2015-11-16 DIAGNOSIS — Z85038 Personal history of other malignant neoplasm of large intestine: Secondary | ICD-10-CM | POA: Insufficient documentation

## 2015-11-16 DIAGNOSIS — E119 Type 2 diabetes mellitus without complications: Secondary | ICD-10-CM | POA: Insufficient documentation

## 2015-11-16 DIAGNOSIS — K621 Rectal polyp: Secondary | ICD-10-CM | POA: Insufficient documentation

## 2015-11-16 HISTORY — PX: POUCHOSCOPY: SHX6321

## 2015-11-16 SURGERY — ENDOSCOPY, POUCH, SMALL INTESTINE, DIAGNOSTIC
Anesthesia: Moderate Sedation

## 2015-11-16 MED ORDER — SODIUM CHLORIDE 0.9 % IV SOLN: INTRAVENOUS | Status: DC

## 2015-11-16 NOTE — Discharge Instructions (Signed)
Post Colonoscopy Instructions ° °1. DIET: Follow a light bland diet the first 24 hours after arrival home, such as soup, liquids, crackers, etc.  Be sure to include lots of fluids daily.  Avoid fast food or heavy meals as your are more likely to get nauseated.   °2. You may have some mild rectal bleeding for the first few days after the procedure.  This should get less and less with time.  Resume any blood thinners 2 days after your procedure unless directed otherwise by your physician. °3. Take your usually prescribed home medications unless otherwise directed. °a. If you have any pain, it is helpful to get up and walk around, as it is usually from excess gas. °b. If this is not helpful, you can take an over-the-counter pain medication.  Choose one of the following that works best for you: °i. Naproxen (Aleve, etc)  Two 220mg tabs twice a day °ii. Ibuprofen (Advil, etc) Three 200mg tabs four times a day (every meal & bedtime) °iii. If you still have pain after using one of these, please call the office °4. It is normal to not have a bowel movement for 2-3 days after colonoscopy.   ° °5. ACTIVITIES as tolerated:   °6. You may resume regular (light) daily activities beginning the next day--such as daily self-care, walking, climbing stairs--gradually increasing activities as tolerated.  ° ° °WHEN TO CALL US [phone]: °1. Fever over 101.5 F (38.5 C)  °2. Severe abdominal or chest pain  °3. Large amount of rectal bleeding, passing multiple blood clots  °4. Dizziness or shortness of breath °5. Increasing nausea or vomiting ° ° The clinic staff is available to answer your questions during regular business hours (8:30am-5pm).  Please don’t hesitate to call and ask to speak to one of our nurses for clinical concerns.  ° If you have a medical emergency, go to the nearest emergency room or call 911. ° A surgeon from [facility] is always on call at the hospitals ° ° °[facility] °[address], [address], [place], [place]  [address] ? °MAIN: [phone] ? TOLL FREE: [phone] ?  °FAX [phone] °[email or web address] ° ° °

## 2015-11-16 NOTE — Op Note (Signed)
[facility] Patient Name: Cory Harper Procedure Date: 11/16/2015 MRN: [id number] Attending MD: Leighton Ruff , MD Date of Birth: 17-Sep-1955 CSN:  Age: 60 Admit Type: Outpatient Procedure:                Pouchoscopy Indications:              History of total proctocolectomy, Personal history                            of malignant neoplasm of the colon, History of                            familial polyposis, Last colonoscopy 1 year ago Providers:                Leighton Ruff, MD, Tory Emerald, RN, Cherylynn Ridges,                            Technician, Ralene Bathe, Technician Referring MD:              Medicines:                None Complications:            No immediate complications. Estimated Blood Loss:     Estimated blood loss was minimal. Procedure:                Pre-Anesthesia Assessment:                           - Prior to the procedure, a History and Physical                            was performed, and patient medications and                            allergies were reviewed. The patient is competent.                            The risks and benefits of the procedure and the                            sedation options and risks were discussed with the                            patient. All questions were answered and informed                            consent was obtained. Patient identification and                            proposed procedure were verified by the nurse in                            the pre-procedure area. Mental Status Examination:  alert and oriented. Airway Examination: normal                            oropharyngeal airway and neck mobility. Respiratory                            Examination: clear to auscultation. CV Examination:                            normal. ASA Grade Assessment: II - A patient with                            mild systemic disease. After reviewing the risks       and benefits, the patient was deemed in                            satisfactory condition to undergo the procedure.                            The anesthesia plan was to use no sedation or                            anesthesia. Immediately prior to administration of                            medications, the patient was re-assessed for                            adequacy to receive sedatives. The heart rate,                            respiratory rate, oxygen saturations, blood                            pressure, adequacy of pulmonary ventilation, and                            response to care were monitored throughout the                            procedure. The physical status of the patient was                            re-assessed after the procedure.                           After obtaining informed consent, the endoscope was                            passed under direct vision. Throughout the                            procedure, the patient's blood pressure, pulse, and  oxygen saturations were monitored continuously. The                            [id number]) scope was introduced through                            the ileoanal anastomosis via the anus and advanced                            to the J-pouch. The procedure was performed with                            ease. The patient tolerated the procedure well. Scope In: 8:29:44 AM Scope Out: 8:42:30 AM Total Procedure Duration: 0 hours 12 minutes 46 seconds  Findings:      The digital rectal exam findings include patent anastomosis. Pertinent       negatives include normal sphincter tone.      Rectal cuff with mild inflammatory changes      The rectal cuff contained one sessile, non-bleeding polyp. The polyp was       2 mm in diameter. Biopsies were taken with a cold forceps for histology. Impression:               - Patent anastomosis. found on digital rectal exam.                            - No specimens collected.                           - One 2 mm, non-bleeding polyp in the rectum,                            removed with a cold biopsy forceps. Resected and                            retrieved. Recommendation:           - The findings and recommendations were discussed                            with the patient.                           - Await pathology results. Procedure Code(s):        --- Professional ---                           [id number], Endoscopic evaluation of small intestinal                            pouch (eg, Kock pouch, ileal reservoir [S or J]);                            with biopsy, single or multiple Diagnosis Code(s):        --- Professional ---  K62.1, Rectal polyp                           Z90.49, Acquired absence of other specified parts                            of digestive tract                           Z86.010, Personal history of colonic polyps                           Z85.038, Personal history of other malignant                            neoplasm of large intestine CPT copyright 2016 American Medical Association. All rights reserved. The codes documented in this report are preliminary and upon coder review may  be revised to meet current compliance requirements. Leighton Ruff, MD Leighton Ruff, MD [id number] 8:57:35 AM This report has been signed electronically. Number of Addenda: 0

## 2015-11-16 NOTE — H&P (Signed)
HPI: Pt with FAP.  S/p total proctocolectomy and J pouch for FAP and colon cancer stage 2.  He is here for his yearly pouchoscopy.  He is up to date with his cancer surveillance.  There are no signs of recurrence.  Past Medical History  Diagnosis Date  . Diabetes mellitus   . Nasal congestion   . Colonic mass   . Hyperlipidemia   . Heart murmur   . Cancer ([place])     stg II colon cancer s/p resection  . Arthritis   . Hypertension     not on blood pressure meds since 11/13   . Hypothyroidism   . FAP (familial adenomatous polyposis)   . Anemia     Past Surgical History  Procedure Laterality Date  . Knee arthroscopy  1998    Right knee  . Upper gastrointestinal endoscopy    . Wisdom tooth extraction    . Colon resection  05/20/2012    Procedure: COLON RESECTION LAPAROSCOPIC;  Surgeon: Adin Hector, MD;  Location: [facility];  Service: General;  Laterality: N/A;  Laparoscopic Proctocolectomy, Ileal Pouch Anal Anastomoisis, Loop Ileostomy  . Bowel resection  05/20/2012  . Thyroid lobectomy  05/20/2012    Procedure: THYROID LOBECTOMY;  Surgeon: Adin Hector, MD;  Location: [facility];  Service: General;  Laterality: Left;  LEFT THYROID LOBECTOMY  . Ileostomy  05/20/12  . Flexible sigmoidoscopy  08/01/2012    Procedure: FLEXIBLE SIGMOIDOSCOPY;  Surgeon: Leighton Ruff, MD;  Location: [facility];  Service: Endoscopy;  Laterality: N/A;  use endoscope  . Ileostomy closure N/A 09/18/2012    Procedure: Loop Ileostomy Takedown with EUA ;  Surgeon: Adin Hector, MD;  Location: [facility];  Service: General;  Laterality: N/A;  loop ileostomy takedown with EUA   . Esophagogastroduodenoscopy (egd) with propofol N/A 04/23/2013    Procedure: ESOPHAGOGASTRODUODENOSCOPY (EGD) WITH PROPOFOL;  Surgeon: Milus Banister, MD;  Location: [facility];  Service: Endoscopy;  Laterality: N/A;  side viewing scope  . Pouchoscopy  11/16/2013    Procedure: POUCHOSCOPY;  Surgeon: Leighton Ruff, MD;  Location: [facility];  Service: Endoscopy;;  . Flexible sigmoidoscopy N/A 11/29/2014    Procedure: FLEXIBLE SIGMOIDOSCOPY/POUCHOSCOPY;  Surgeon: Leighton Ruff, MD;  Location: [facility];  Service: Endoscopy;  Laterality: N/A;   Social History   Social History  . Marital Status: Married    Spouse Name: June  . Number of Children: 0  . Years of Education: N/A   Occupational History  . 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 Smoking status: Former Smoker    Quit date: 08/07/1991  . Smokeless tobacco: Never Used     Comment: quit in 1993  . Alcohol Use: No  . Drug Use: No  . Sexual Activity: Not on file   Other Topics Concern  . 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 Uterine cancer Mother   . Hypertension Mother   . Stroke Mother   . Colon cancer Mother   . Colon polyps Mother   . Cancer Mother     colon, endometrial  . Diabetes Father   . Obesity Father   . Pneumonia  Father   . Cancer Father     skin - squamous   . Diabetes Paternal Grandmother   . Prostate cancer Neg Hx      Medication List            aspirin 81 MG tablet  Take 81 mg by mouth daily.     atorvastatin 20 MG tablet  Commonly known as:  LIPITOR  Take 1 tablet (20 mg total) by mouth daily.     cholecalciferol 1000 units tablet  Commonly known as:  VITAMIN D  Take 1,000 Units by mouth daily.     ferrous sulfate 325 (65 FE) MG tablet  Take 325 mg by mouth 2 (two) times daily.     fluticasone 50 MCG/ACT nasal spray  Commonly known as:  FLONASE  Place 2 sprays into both nostrils daily.     glimepiride 2 MG tablet  Commonly known as:  AMARYL  take 1 tablet twice a day     levothyroxine 100 MCG tablet  Commonly known as:  SYNTHROID, LEVOTHROID   Take 1 tablet (100 mcg total) by mouth every morning.     loperamide 2 MG tablet  Commonly known as:  IMODIUM A-D  Take 2-4 mg by mouth 2 (two) times daily as needed for diarrhea or loose stools (Goal is 4-6 BMs / day). For diarrhea     loratadine 10 MG tablet  Commonly known as:  CLARITIN  Take 10 mg by mouth daily.     metFORMIN 1000 MG tablet  Commonly known as:  GLUCOPHAGE  take 1 tablet by mouth twice a day with food     ONE TOUCH ULTRA SYSTEM KIT w/Device Kit  Dispense 1 kit with 100 lancets and strips, with 3 rf on lancets and strips.  Check sugar daily.  Dx E11.9     sitaGLIPtin 100 MG tablet  Commonly known as:  JANUVIA  Take 1 tablet (100 mg total) by mouth daily.     vitamin B-12 500 MCG tablet  Commonly known as:  CYANOCOBALAMIN  Take 500 mcg by mouth daily.       Allergies  Allergen Reactions  . Ibuprofen Swelling    Made his joints swell with high dose   Review of Systems - General ROS: negative for - chills or fever Respiratory ROS: no cough, shortness of breath, or wheezing Cardiovascular ROS: no chest pain or dyspnea on exertion Gastrointestinal ROS: no abdominal pain, change in bowel habits, or black or bloody stools Genito-Urinary ROS: no dysuria, trouble voiding, or hematuria Physical Exam  Constitutional: He is oriented to person, place, and time. He appears well-developed and well-nourished.  HENT:  Head: Normocephalic and atraumatic.  Eyes: EOM are normal. Pupils are equal, round, and reactive to light.  Neck: Normal range of motion. Neck supple.  Cardiovascular: Normal rate and regular rhythm.   Pulmonary/Chest: Effort normal.  Abdominal: Soft.  Musculoskeletal: Normal range of motion.  Neurological: He is alert and oriented to person, place, and time.  Skin: Skin is warm and dry.  Assessment: FAP, s/p j pouch  Plan: screening pouchoscopy.  Polypectomy if needed.  Risks include bleeding and a small change of perforation.  Pt agrees to  proceed.

## 2015-11-22 ENCOUNTER — Encounter (HOSPITAL_COMMUNITY): Payer: Self-pay | Admitting: General Surgery

## 2015-12-12 ENCOUNTER — Other Ambulatory Visit: Payer: Self-pay | Admitting: Family Medicine

## 2015-12-12 DIAGNOSIS — IMO0001 Reserved for inherently not codable concepts without codable children: Secondary | ICD-10-CM

## 2015-12-12 DIAGNOSIS — E1165 Type 2 diabetes mellitus with hyperglycemia: Principal | ICD-10-CM

## 2015-12-16 ENCOUNTER — Other Ambulatory Visit (INDEPENDENT_AMBULATORY_CARE_PROVIDER_SITE_OTHER): Payer: BC Managed Care – PPO

## 2015-12-16 DIAGNOSIS — E1165 Type 2 diabetes mellitus with hyperglycemia: Secondary | ICD-10-CM | POA: Diagnosis not present

## 2015-12-16 DIAGNOSIS — IMO0001 Reserved for inherently not codable concepts without codable children: Secondary | ICD-10-CM

## 2015-12-16 LAB — MICROALBUMIN / CREATININE URINE RATIO
Creatinine,U: 195.3 mg/dL
MICROALB UR: 0.7 mg/dL (ref 0.0–1.9)
Microalb Creat Ratio: 0.4 mg/g (ref 0.0–30.0)

## 2015-12-16 LAB — HEMOGLOBIN A1C: Hgb A1c MFr Bld: 8.9 % — ABNORMAL HIGH (ref 4.6–6.5)

## 2015-12-21 ENCOUNTER — Telehealth: Payer: Self-pay | Admitting: Family Medicine

## 2015-12-21 NOTE — Telephone Encounter (Signed)
Paperwork is on your desk for completion and return to [place].

## 2015-12-21 NOTE — Telephone Encounter (Signed)
Pt dropped off fmla paperwork For his wife to go with him to dr appointments In Dr Josefine Class IN BOX

## 2015-12-21 NOTE — Telephone Encounter (Signed)
I'll work on the hard copy.  Thanks.  

## 2015-12-22 NOTE — Telephone Encounter (Signed)
Thanks

## 2015-12-23 ENCOUNTER — Ambulatory Visit: Payer: BC Managed Care – PPO | Admitting: Family Medicine

## 2015-12-29 ENCOUNTER — Ambulatory Visit: Payer: BC Managed Care – PPO | Admitting: Family Medicine

## 2015-12-30 ENCOUNTER — Ambulatory Visit (INDEPENDENT_AMBULATORY_CARE_PROVIDER_SITE_OTHER): Payer: BC Managed Care – PPO | Admitting: Family Medicine

## 2015-12-30 ENCOUNTER — Encounter: Payer: Self-pay | Admitting: Family Medicine

## 2015-12-30 VITALS — BP 110/70 | HR 93 | Temp 98.3°F | Wt 256.0 lb

## 2015-12-30 DIAGNOSIS — E1165 Type 2 diabetes mellitus with hyperglycemia: Secondary | ICD-10-CM

## 2015-12-30 DIAGNOSIS — IMO0001 Reserved for inherently not codable concepts without codable children: Secondary | ICD-10-CM

## 2015-12-30 NOTE — Patient Instructions (Signed)
Keep exercising and recheck labs in about 3 months.   Take care.  Glad to see you.  Don't change your meds for now.

## 2015-12-30 NOTE — Progress Notes (Signed)
Pre visit review using our clinic review tool, if applicable. No additional management support is needed unless otherwise documented below in the visit note.  He had f/u surgery with endoscopy.   Diabetes:  Using medications without difficulties:yes Hypoglycemic episodes:no Hyperglycemic episodes:no Feet problems:no Blood Sugars averaging:usually ~150, occ lower in the AM.  eye exam within last year:yes He has been on the exercise bike.   A1c improved.  He can't take injection meds and continue his job.  He is working on getting better control.  He doesn't have a contraindication to driving in the meantime, given his A1c.    Meds, vitals, and allergies reviewed.   ROS: Per HPI unless specifically indicated in ROS section   GEN: nad, alert and oriented HEENT: mucous membranes moist NECK: supple w/o LA CV: rrr. PULM: ctab, no inc wob ABD: soft, +bs EXT: no edema  Diabetic foot exam: Normal inspection No skin breakdown No calluses  Normal DP pulses Normal sensation to light touch and monofilament Nails normal except for fungal changes on L 1st nail

## 2016-01-02 NOTE — Assessment & Plan Note (Signed)
He has been on the exercise bike.   A1c improved.  He can't take injection meds and continue his job.  He is working on getting better control.  He doesn't have a contraindication to driving in the meantime, given his A1c.   Would continue as is with diet and exercise and meds as is, recheck in about 3 months.  He agrees.

## 2016-02-06 ENCOUNTER — Other Ambulatory Visit: Payer: Self-pay | Admitting: *Deleted

## 2016-02-06 MED ORDER — GLIMEPIRIDE 2 MG PO TABS: 2.0000 mg | ORAL_TABLET | Freq: Two times a day (BID) | ORAL | Status: DC

## 2016-02-06 MED ORDER — METFORMIN HCL 1000 MG PO TABS: 1000.0000 mg | ORAL_TABLET | Freq: Two times a day (BID) | ORAL | Status: DC

## 2016-02-10 ENCOUNTER — Telehealth: Payer: Self-pay

## 2016-02-10 NOTE — Telephone Encounter (Signed)
Tagen Nix [phone]  [place] dropped off DMV Forms that needs to be filled out. Call him when ready. Placed in  RX box up front.

## 2016-02-10 NOTE — Telephone Encounter (Signed)
Placed in Dr. Duncan's In Box. 

## 2016-02-11 NOTE — Telephone Encounter (Signed)
I'll work on the hard copy.  Thanks.  

## 2016-03-18 ENCOUNTER — Other Ambulatory Visit: Payer: Self-pay | Admitting: Family Medicine

## 2016-03-18 DIAGNOSIS — E1165 Type 2 diabetes mellitus with hyperglycemia: Principal | ICD-10-CM

## 2016-03-18 DIAGNOSIS — IMO0001 Reserved for inherently not codable concepts without codable children: Secondary | ICD-10-CM

## 2016-03-22 ENCOUNTER — Other Ambulatory Visit (INDEPENDENT_AMBULATORY_CARE_PROVIDER_SITE_OTHER): Payer: BC Managed Care – PPO

## 2016-03-22 DIAGNOSIS — IMO0001 Reserved for inherently not codable concepts without codable children: Secondary | ICD-10-CM

## 2016-03-22 DIAGNOSIS — E1165 Type 2 diabetes mellitus with hyperglycemia: Secondary | ICD-10-CM

## 2016-03-22 LAB — HEMOGLOBIN A1C: Hgb A1c MFr Bld: 9.2 % — ABNORMAL HIGH (ref 4.6–6.5)

## 2016-03-23 ENCOUNTER — Other Ambulatory Visit: Payer: BC Managed Care – PPO

## 2016-03-30 ENCOUNTER — Encounter: Payer: Self-pay | Admitting: Family Medicine

## 2016-03-30 ENCOUNTER — Ambulatory Visit (INDEPENDENT_AMBULATORY_CARE_PROVIDER_SITE_OTHER): Payer: BC Managed Care – PPO | Admitting: Family Medicine

## 2016-03-30 VITALS — BP 134/70 | HR 62 | Temp 97.8°F | Wt 253.5 lb

## 2016-03-30 DIAGNOSIS — Z23 Encounter for immunization: Secondary | ICD-10-CM

## 2016-03-30 DIAGNOSIS — E1165 Type 2 diabetes mellitus with hyperglycemia: Secondary | ICD-10-CM

## 2016-03-30 DIAGNOSIS — IMO0001 Reserved for inherently not codable concepts without codable children: Secondary | ICD-10-CM

## 2016-03-30 NOTE — Progress Notes (Signed)
Diabetes:  Using medications without difficulties:yes Hypoglycemic episodes:no Hyperglycemic episodes:no Feet problems:no Blood Sugars averaging: usually ~170s when off diet, better ~120s when on diet.  eye exam within last year:yes A1c d/w pt.  Up to 9.2  Diet was off.  He admits to that.  D/w pt.  He had prev ~1 point drop w/o med changes.  Still on exercise program.  Walking and biking.  He cannot take injections and keep his job.    He is still working as a DJ at the station.   Meds, vitals, and allergies reviewed.   ROS: Per HPI unless specifically indicated in ROS section   GEN: nad, alert and oriented HEENT: mucous membranes moist NECK: supple w/o LA CV: rrr. PULM: ctab, no inc wob ABD: soft, +bs EXT: no edema  Diabetic foot exam: Normal inspection No skin breakdown No calluses  Normal DP pulses Normal sensation to light touch and monofilament Nails normal

## 2016-03-30 NOTE — Progress Notes (Signed)
Pre visit review using our clinic review tool, if applicable. No additional management support is needed unless otherwise documented below in the visit note. 

## 2016-03-30 NOTE — Patient Instructions (Signed)
Recheck A1c in about 3 months before a visit. .  Let me check on options in the meantime.  Get back on your diet.  Take care.  Glad to see you.

## 2016-04-01 ENCOUNTER — Telehealth: Payer: Self-pay | Admitting: Family Medicine

## 2016-04-01 NOTE — Telephone Encounter (Signed)
Call pt.  Likely best option is to inc glimepiride to 4mg  in the AM if AM sugar is >150 and have him continue to work on diet.  If AM sugar is <150, then only take 2mg  glimepiride in the AM.  Either way, continue 2mg  glimepiride in the PM.  Recheck in about 3 months.  Thanks.

## 2016-04-01 NOTE — Assessment & Plan Note (Signed)
A1c d/w pt.  Up to 9.2  Diet was off.  He admits to that.  D/w pt.  He had prev ~1 point drop w/o med changes.  Still on exercise program.  Walking and biking.  Likely best option is to inc glimepiride to 4mg  in the AM if AM sugar is >150 and have him continue to work on diet.  If AM sugar is <150, then only take 2mg  glimepiride in the AM.  Either way, continue 2mg  glimepiride in the PM.  Recheck in about 3 months.  Thanks.

## 2016-04-02 NOTE — Telephone Encounter (Signed)
Left message on patient's voicemail to return call

## 2016-04-03 NOTE — Telephone Encounter (Signed)
Patient advised and already has appt for lab and OV in 3 months.

## 2016-04-03 NOTE — Telephone Encounter (Signed)
Patient returned Cory Harper's call.  Please call patient back at [phone].

## 2016-04-30 MED ORDER — GLIMEPIRIDE 2 MG PO TABS: ORAL_TABLET | ORAL | 1 refills | Status: DC

## 2016-04-30 NOTE — Telephone Encounter (Addendum)
Pt request new rx sent with updated instructions to [facility] [address]. Spoke with Judson Roch  at [facility] and cancelled old rx for glimepiride; new rx sent electronically; pt has f/u appt in 06/2016.

## 2016-04-30 NOTE — Addendum Note (Signed)
Addended by: Helene Shoe on: 04/30/2016 11:29 AM   Modules accepted: Orders

## 2016-05-29 ENCOUNTER — Other Ambulatory Visit: Payer: Self-pay | Admitting: Family Medicine

## 2016-06-14 ENCOUNTER — Other Ambulatory Visit: Payer: BC Managed Care – PPO

## 2016-06-15 ENCOUNTER — Ambulatory Visit (HOSPITAL_BASED_OUTPATIENT_CLINIC_OR_DEPARTMENT_OTHER): Payer: BC Managed Care – PPO | Admitting: Oncology

## 2016-06-15 ENCOUNTER — Telehealth: Payer: Self-pay | Admitting: Oncology

## 2016-06-15 ENCOUNTER — Other Ambulatory Visit (HOSPITAL_BASED_OUTPATIENT_CLINIC_OR_DEPARTMENT_OTHER): Payer: BC Managed Care – PPO

## 2016-06-15 VITALS — BP 135/65 | HR 77 | Temp 98.1°F | Resp 17 | Ht 70.0 in | Wt 250.6 lb

## 2016-06-15 DIAGNOSIS — Z85038 Personal history of other malignant neoplasm of large intestine: Secondary | ICD-10-CM

## 2016-06-15 DIAGNOSIS — C180 Malignant neoplasm of cecum: Secondary | ICD-10-CM

## 2016-06-15 LAB — CEA (IN HOUSE-CHCC): CEA (CHCC-In House): 1.71 ng/mL (ref 0.00–5.00)

## 2016-06-15 NOTE — Telephone Encounter (Signed)
Appointments scheduled per 11/10 LOS. Patient given AVS report and calendars with future scheduled appointments. °

## 2016-06-15 NOTE — Progress Notes (Signed)
  [place] OFFICE PROGRESS NOTE   Diagnosis: Colon cancer  INTERVAL HISTORY:   Cory Harper returns as scheduled. He feels well. He has 5-6 bowel movement per day. Imodium decreases the stool frequency. No other complaint. He underwent a lower endoscopy by Dr. Marcello Moores in April. A polyp was found in the rectal cuff. Biopsies were obtained. The pathology revealed benign changes. He continues surveillance endoscopies at [facility].  Objective:  Vital signs in last 24 hours:  Blood pressure 135/65, pulse 77, temperature 98.1 F (36.7 C), temperature source Oral, resp. rate 17, height 5\' 10"  (1.778 m), weight 250 lb 9.6 oz (113.7 kg), SpO2 100 %.    HEENT: Neck without mass Lymphatics: No cervical, supraclavicular, axillary, or inguinal nodes Resp: Lungs clear bilaterally Cardio: Regular rate and rhythm GI: No hepatosplenic the, no mass, nontender Vascular: No leg edema  Skin: Yeast rash in the groin bilaterally   Lab Results:  [name]-pending  Medications: I have reviewed the patient's current medications.  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 Ileostomy takedown 09/18/2012.  2. Familial polyposis with multiple adenomatous polyps noted on the colectomy specimen 05/20/2012 -followed  At [facility] and  by Dr. Marcello Moores for surveillance endoscopies.  3. 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  4. Diabetes.  5. Status post endoscopic removal of a duodenal polyp at [facility] on 06/08/2013 and 10/25/2014    Disposition:  Mr. Sebree remains in clinical remission from colon cancer. We will follow-up on the [name] from today. He will continue surveillance of the rectal stump by Dr. Marcello Moores and surveillance endoscopies at [facility].  Mr. Kostoff would like  to continue follow-up at the [facility]. He will return for an office visit in one year.  Betsy Coder, MD  06/15/2016  11:40 AM

## 2016-06-16 LAB — CEA (PARALLEL TESTING)

## 2016-06-18 ENCOUNTER — Telehealth: Payer: Self-pay | Admitting: *Deleted

## 2016-06-18 NOTE — Telephone Encounter (Signed)
Notified pt of normal [name]. He voiced understanding. 

## 2016-06-18 NOTE — Telephone Encounter (Signed)
-----   Message from Ladell Pier, MD sent at 06/17/2016  8:11 AM EST ----- Please call patient, cea is normal

## 2016-06-25 ENCOUNTER — Other Ambulatory Visit: Payer: BC Managed Care – PPO

## 2016-06-26 ENCOUNTER — Other Ambulatory Visit: Payer: BC Managed Care – PPO

## 2016-06-26 ENCOUNTER — Other Ambulatory Visit: Payer: Self-pay | Admitting: Family Medicine

## 2016-06-26 DIAGNOSIS — E1165 Type 2 diabetes mellitus with hyperglycemia: Principal | ICD-10-CM

## 2016-06-26 DIAGNOSIS — E538 Deficiency of other specified B group vitamins: Secondary | ICD-10-CM

## 2016-06-26 DIAGNOSIS — IMO0001 Reserved for inherently not codable concepts without codable children: Secondary | ICD-10-CM

## 2016-06-27 ENCOUNTER — Other Ambulatory Visit (INDEPENDENT_AMBULATORY_CARE_PROVIDER_SITE_OTHER): Payer: BC Managed Care – PPO

## 2016-06-27 DIAGNOSIS — E538 Deficiency of other specified B group vitamins: Secondary | ICD-10-CM | POA: Diagnosis not present

## 2016-06-27 DIAGNOSIS — E1165 Type 2 diabetes mellitus with hyperglycemia: Secondary | ICD-10-CM

## 2016-06-27 DIAGNOSIS — IMO0001 Reserved for inherently not codable concepts without codable children: Secondary | ICD-10-CM

## 2016-06-27 LAB — LIPID PANEL
CHOL/HDL RATIO: 3
CHOLESTEROL: 113 mg/dL (ref 0–200)
HDL: 36.3 mg/dL — ABNORMAL LOW (ref >39.00)
LDL CALC: 41 mg/dL (ref 0–99)
NonHDL: 76.35
Triglycerides: 178 mg/dL — ABNORMAL HIGH (ref 0.0–149.0)
VLDL: 35.6 mg/dL (ref 0.0–40.0)

## 2016-06-27 LAB — COMPREHENSIVE METABOLIC PANEL
ALBUMIN: 3.7 g/dL (ref 3.5–5.2)
ALT: 27 U/L (ref 0–53)
AST: 27 U/L (ref 0–37)
Alkaline Phosphatase: 62 U/L (ref 39–117)
BUN: 15 mg/dL (ref 6–23)
CHLORIDE: 102 meq/L (ref 96–112)
CO2: 29 mEq/L (ref 19–32)
CREATININE: 1.31 mg/dL (ref 0.40–1.50)
Calcium: 9.5 mg/dL (ref 8.4–10.5)
GFR: 59.54 mL/min — ABNORMAL LOW (ref >60.00)
Glucose, Bld: 139 mg/dL — ABNORMAL HIGH (ref 70–99)
Potassium: 4.6 mEq/L (ref 3.5–5.1)
SODIUM: 138 meq/L (ref 135–145)
Total Bilirubin: 0.7 mg/dL (ref 0.2–1.2)
Total Protein: 6.8 g/dL (ref 6.0–8.3)

## 2016-06-27 LAB — TSH: TSH: 2.29 u[IU]/mL (ref 0.35–4.50)

## 2016-06-27 LAB — HEMOGLOBIN A1C: Hgb A1c MFr Bld: 9 % — ABNORMAL HIGH (ref 4.6–6.5)

## 2016-06-27 LAB — VITAMIN B12: VITAMIN B 12: 937 pg/mL — AB (ref 211–911)

## 2016-06-29 ENCOUNTER — Other Ambulatory Visit: Payer: Self-pay | Admitting: Family Medicine

## 2016-07-02 ENCOUNTER — Ambulatory Visit: Payer: BC Managed Care – PPO | Admitting: Family Medicine

## 2016-07-03 ENCOUNTER — Ambulatory Visit (INDEPENDENT_AMBULATORY_CARE_PROVIDER_SITE_OTHER): Payer: BC Managed Care – PPO | Admitting: Family Medicine

## 2016-07-03 ENCOUNTER — Encounter: Payer: Self-pay | Admitting: Family Medicine

## 2016-07-03 VITALS — BP 130/70 | HR 66 | Temp 97.7°F | Wt 250.8 lb

## 2016-07-03 DIAGNOSIS — E119 Type 2 diabetes mellitus without complications: Secondary | ICD-10-CM

## 2016-07-03 DIAGNOSIS — E039 Hypothyroidism, unspecified: Secondary | ICD-10-CM

## 2016-07-03 DIAGNOSIS — E1165 Type 2 diabetes mellitus with hyperglycemia: Secondary | ICD-10-CM

## 2016-07-03 DIAGNOSIS — E7800 Pure hypercholesterolemia, unspecified: Secondary | ICD-10-CM

## 2016-07-03 DIAGNOSIS — E538 Deficiency of other specified B group vitamins: Secondary | ICD-10-CM | POA: Diagnosis not present

## 2016-07-03 DIAGNOSIS — IMO0001 Reserved for inherently not codable concepts without codable children: Secondary | ICD-10-CM

## 2016-07-03 NOTE — Patient Instructions (Addendum)
Recheck A1c in about 3 months.   Work on diet and exercise in the meantime.  Take care.  Glad to see you.  Update me as needed.

## 2016-07-03 NOTE — Progress Notes (Signed)
Pre visit review using our clinic review tool, if applicable. No additional management support is needed unless otherwise documented below in the visit note. 

## 2016-07-03 NOTE — Progress Notes (Signed)
Diabetes:  Using medications without difficulties:yes Hypoglycemic episodes:no Hyperglycemic episodes:no Feet problems: no Blood Sugars averaging: this AM was 168.  "It's been better when I behave myself and work on my diet." eye exam within last year:yes A1c still up but slightly better than prev, d/w pt.   He was holding out injection tx due to his job requirements.   D/w pt about diet and exercise.  He was asking adding on cinnamon; this may do some good, d/w pt.  Would be okay to try with routine cautions.   We talked about pros and cons of other oral meds- ie invokana.  He wanted to work on diet and exercise in the meantime, d/w pt.    H/o B12 def, repleted.  D/w pt about labs.    Hypothyroidism.  TSH wnl.  No ADE on med.  Complaint.  Labs d/w pt.    Elevated Cholesterol: Using medications without problems:yes Muscle aches: no Diet compliance:encouraged Exercise: encouraged  Meds, vitals, and allergies reviewed.   ROS: Per HPI unless specifically indicated in ROS section   GEN: nad, alert and oriented HEENT: mucous membranes moist NECK: supple w/o LA CV: rrr. PULM: ctab, no inc wob ABD: soft, +bs EXT: no edema SKIN: no acute rash  Diabetic foot exam: Normal inspection No skin breakdown No calluses  Normal DP pulses Normal sensation to light touch and monofilament Nails thick on B 1st nails.

## 2016-07-04 NOTE — Assessment & Plan Note (Signed)
TSH controlled. Continue as is. No thyromegaly on exam.

## 2016-07-04 NOTE — Assessment & Plan Note (Addendum)
Continue current medications. Still needs work on diet and exercise and weight loss. Discussed with patient about previous labs and medication. He agrees. >25 minutes spent in face to face time with patient, >50% spent in counselling or coordination of care.

## 2016-07-04 NOTE — Assessment & Plan Note (Signed)
He can use injection medications with his current job. Discussed with patient about options. He wanted to avoid extra oral meds at this point. Continue work on diet next size. He agrees. Recheck A1c in about 3 months.

## 2016-07-04 NOTE — Assessment & Plan Note (Signed)
Repleted. Continue as is. Labs discussed with patient.

## 2016-07-07 ENCOUNTER — Other Ambulatory Visit: Payer: Self-pay | Admitting: Family Medicine

## 2016-07-24 ENCOUNTER — Other Ambulatory Visit: Payer: Self-pay | Admitting: Family Medicine

## 2016-08-05 ENCOUNTER — Other Ambulatory Visit: Payer: Self-pay | Admitting: Family Medicine

## 2016-08-24 ENCOUNTER — Other Ambulatory Visit: Payer: Self-pay | Admitting: Family Medicine

## 2016-08-31 ENCOUNTER — Telehealth: Payer: Self-pay | Admitting: Gastroenterology

## 2016-08-31 NOTE — Telephone Encounter (Signed)
Dr Ardis Hughs please advise.  If pt should follow up with you or someone else?

## 2016-09-01 NOTE — Telephone Encounter (Signed)
i'm happy to see him back.  Offer rov next available, thanks

## 2016-09-03 NOTE — Telephone Encounter (Signed)
Pt has been set up for new office appt for 10/29/16 pt is aware

## 2016-09-03 NOTE — Telephone Encounter (Signed)
Left message on machine to call back  

## 2016-09-11 ENCOUNTER — Other Ambulatory Visit: Payer: Self-pay | Admitting: Family Medicine

## 2016-09-26 ENCOUNTER — Other Ambulatory Visit: Payer: Self-pay | Admitting: General Surgery

## 2016-10-02 ENCOUNTER — Other Ambulatory Visit: Payer: BC Managed Care – PPO

## 2016-10-04 ENCOUNTER — Ambulatory Visit: Payer: BC Managed Care – PPO | Admitting: Family Medicine

## 2016-10-25 ENCOUNTER — Other Ambulatory Visit (INDEPENDENT_AMBULATORY_CARE_PROVIDER_SITE_OTHER): Payer: BC Managed Care – PPO

## 2016-10-25 DIAGNOSIS — E119 Type 2 diabetes mellitus without complications: Secondary | ICD-10-CM | POA: Diagnosis not present

## 2016-10-25 LAB — HEMOGLOBIN A1C: HEMOGLOBIN A1C: 9.7 % — AB (ref 4.6–6.5)

## 2016-10-29 ENCOUNTER — Ambulatory Visit (INDEPENDENT_AMBULATORY_CARE_PROVIDER_SITE_OTHER): Payer: BC Managed Care – PPO | Admitting: Gastroenterology

## 2016-10-29 ENCOUNTER — Encounter: Payer: Self-pay | Admitting: Gastroenterology

## 2016-10-29 VITALS — BP 120/76 | HR 76 | Ht 70.0 in | Wt 244.0 lb

## 2016-10-29 DIAGNOSIS — D126 Benign neoplasm of colon, unspecified: Secondary | ICD-10-CM | POA: Diagnosis not present

## 2016-10-29 NOTE — Patient Instructions (Signed)
We will get records sent from your previous gastroenterologist [facility] EGD procedure reports and all associated pathology from those EGD procedures for review.

## 2016-10-29 NOTE — Progress Notes (Signed)
Review of pertinent gastrointestinal problems: 1. 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 2. Familial polyposis with multiple adenomatous polyps noted on the colectomy specimen 05/20/2012; originally he was diagnosed with FAP around year 2000 by Dr. Fuller Plan but he was abstinent and declined surgical recommendations that were spoken to him and also put in writing. 3. EGD 04/2012 for UGI tract screening in FAP found abnormal duodenal mucosa, The biopsies of his upper intestines showed mild villous atrophy.  Celiac Panel was equivocal. EGD Dr. Ardis Hughs 2014 abnormal musosa in duodenum, + adenomatous.  He was referred to [facility] for endoscopic treatments, resection.   HPI: This is a  very pleasant 61 year old man whom I last saw about 4 years ago when I referred him to [facility] for duodenal polyp resection  Chief complaint is familial adenomatous polyposis  Pouch evaluations with dr. Marcello Moores periodically, next is in 2 months.  Old Data Reviewed: I can only see partial records from [facility] upper endoscopy 3 2017. This is pathology results read as "chronic gastritis, negative for malignancy".    Through our care everywhere section in our electronic medical record I can tell that he has had upper endoscopies every year for the past 3 or 4 years at [facility] but I cannot see any of those reports.   fortunately he and his wife brought copies of those procedure reports with them. The 2017 upper endoscopy by Dr. Amalia Hailey at [facility] suggested a 1 cm duodenal polyp. Snare resection pathology showed however this was not polyp.  He feels great. He has no post prandial discomforts. No significant weight changes. No nausea or vomiting.   Review of systems: Pertinent positive and negative review of systems were noted in the above HPI  section. Complete review of systems was performed and was otherwise normal.   Past Medical History:  Diagnosis Date  . Anemia   . Arthritis   . Cancer ([place])    stg II colon cancer s/p resection  . Colonic mass   . Diabetes mellitus   . FAP (familial adenomatous polyposis)   . Heart murmur   . Hyperlipidemia   . Hypertension    not on blood pressure meds since 11/13   . Hypothyroidism   . Nasal congestion     Past Surgical History:  Procedure Laterality Date  . BOWEL RESECTION  05/20/2012  . COLON RESECTION  05/20/2012   Procedure: COLON RESECTION LAPAROSCOPIC;  Surgeon: Adin Hector, MD;  [facility];  Service: General;  Laterality: N/A;  Laparoscopic Proctocolectomy, Ileal Pouch Anal Anastomoisis, Loop Ileostomy  . ESOPHAGOGASTRODUODENOSCOPY (EGD) WITH PROPOFOL N/A 04/23/2013   Procedure: ESOPHAGOGASTRODUODENOSCOPY (EGD) WITH PROPOFOL;  Surgeon: Milus Banister, MD;  [facility];  Service: Endoscopy;  Laterality: N/A;  side viewing scope  . FLEXIBLE SIGMOIDOSCOPY  08/01/2012   Procedure: FLEXIBLE SIGMOIDOSCOPY;  Surgeon: Leighton Ruff, MD;  [facility];  Service: Endoscopy;  Laterality: N/A;  use endoscope  . FLEXIBLE SIGMOIDOSCOPY N/A 11/29/2014   Procedure: FLEXIBLE SIGMOIDOSCOPY/POUCHOSCOPY;  Surgeon: Leighton Ruff, MD;  [facility];  Service: Endoscopy;  Laterality: N/A;  . ILEOSTOMY  05/20/12  . ILEOSTOMY CLOSURE N/A 09/18/2012   Procedure: Loop Ileostomy Takedown with EUA ;  Surgeon: Adin Hector, MD;  [facility];  Service: General;  Laterality: N/A;  loop ileostomy takedown with EUA   .  KNEE ARTHROSCOPY  1998   Right knee  . POUCHOSCOPY  11/16/2013   Procedure: POUCHOSCOPY;  Surgeon: Leighton Ruff, MD;  Location: [facility];  Service: Endoscopy;;  . POUCHOSCOPY N/A 11/16/2015   Procedure: POUCHOSCOPY;  Surgeon: Leighton Ruff, MD;  [facility];  Service: Endoscopy;  Laterality: N/A;  . THYROID LOBECTOMY   05/20/2012   Procedure: THYROID LOBECTOMY;  Surgeon: Adin Hector, MD;  [facility];  Service: General;  Laterality: Left;  LEFT THYROID LOBECTOMY  . UPPER GASTROINTESTINAL ENDOSCOPY    . WISDOM TOOTH EXTRACTION      Current Outpatient Prescriptions  Medication Sig Dispense Refill  . aspirin 81 MG tablet Take 81 mg by mouth daily.    Marland Kitchen atorvastatin (LIPITOR) 20 MG tablet take 1 tablet by mouth daily 90 tablet 0  . Blood Glucose Monitoring Suppl (ONE TOUCH ULTRA SYSTEM KIT) W/DEVICE KIT Dispense 1 kit with 100 lancets and strips, with 3 rf on lancets and strips.  Check sugar daily.  Dx E11.9 1 each 0  . cholecalciferol (VITAMIN D) 1000 UNITS tablet Take 1,000 Units by mouth daily.     . ferrous sulfate 325 (65 FE) MG tablet Take 325 mg by mouth 2 (two) times daily.  0  . fluticasone (FLONASE) 50 MCG/ACT nasal spray Place 2 sprays into both nostrils daily. 16 g 5  . glimepiride (AMARYL) 2 MG tablet take 1 tablet by mouth every morning IF BS IS LESS THAN 150. IF BS IS GREATER THAN 150 TAKE 2 TABLETS BY MOUTH. THEN TAKE 1 TABLET EVERY EVE 90 tablet 3  . JANUVIA 100 MG tablet take 1 tablet by mouth daily 90 tablet 3  . levothyroxine (SYNTHROID, LEVOTHROID) 100 MCG tablet take 1 tablet by mouth every morning 30 tablet 5  . loperamide (IMODIUM A-D) 2 MG tablet Take 2-4 mg by mouth 2 (two) times daily as needed for diarrhea or loose stools (Goal is 4-6 BMs / day). For diarrhea    . loratadine (CLARITIN) 10 MG tablet Take 10 mg by mouth daily.     . metFORMIN (GLUCOPHAGE) 1000 MG tablet take 1 tablet by mouth twice a day with food 60 tablet 5  . ONE TOUCH ULTRA TEST test strip CHECK SUGAR daily as directed by prescriber 100 each 3  . RA ANTI-DIARRHEAL 2 MG capsule Take 1 capsule by mouth 2 (two) times daily.  0  . vitamin B-12 (CYANOCOBALAMIN) 500 MCG tablet Take 500 mcg by mouth daily.     No current facility-administered medications for this visit.     Allergies as of 10/29/2016 - Review  Complete 10/29/2016  Allergen Reaction Noted  . Ibuprofen Swelling 05/22/2006    Family History  Problem Relation Age of Onset  . Uterine cancer Mother   . Hypertension Mother   . Stroke Mother   . Colon cancer Mother   . Colon polyps Mother   . Cancer Mother     colon, endometrial  . Diabetes Father   . Obesity Father   . Pneumonia Father   . Cancer Father     skin - squamous   . Diabetes Paternal Grandmother   . Prostate cancer Neg Hx     Social History   Social History  . Marital status: Married    Spouse name: June  . Number of children: 0  . Years of education: N/A   Occupational History  . 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 Smoking status: Former Smoker    Quit date: 08/07/1991  . Smokeless tobacco: Never Used     Comment: quit in 1993  . Alcohol use No  . Drug use: No  . Sexual activity: Not on file   Other Topics Concern  . 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[facility])   Bus driver for [place]   Married 1996   No kids     Physical Exam: BP 120/76   Pulse 76   Ht _0  (1.778 m)   Wt 244 lb (110.7 kg)   BMI 35.01 kg/m  Constitutional: generally well-appearing Psychiatric: alert and oriented x3 Eyes: extraocular movements intact Mouth: oral pharynx moist, no lesions Neck: supple no lymphadenopathy Cardiovascular: heart regular rate and rhythm Lungs: clear to auscultation bilaterally Abdomen: soft, nontender, nondistended, no obvious ascites, no peritoneal signs, normal bowel sounds Extremities: no lower extremity edema bilaterally Skin: no lesions on visible extremities   Assessment and plan: 61 y.o. male with  Familial adenomatous polyposis  His last upper endoscopy was about a year ago. He has had recurrent adenomatous polyp in his duodenum previously followed by me and then I recommended  transfer to for her to tertiary center for endoscopically difficult to remove polyp in 2014. That polyp was taken care of. He is getting about annual upper endoscopies given his personal history of adenomatous polyps there. I recommended we repeat that for him at his soonest convenience.    Please see the "Patient Instructions" section for addition details about the plan.   Owens Loffler, MD [facility] 10/29/2016, 9:25 AM  Cc: Tonia Ghent, MD

## 2016-11-01 ENCOUNTER — Ambulatory Visit (INDEPENDENT_AMBULATORY_CARE_PROVIDER_SITE_OTHER): Payer: BC Managed Care – PPO | Admitting: Family Medicine

## 2016-11-01 ENCOUNTER — Encounter: Payer: Self-pay | Admitting: Family Medicine

## 2016-11-01 DIAGNOSIS — E1165 Type 2 diabetes mellitus with hyperglycemia: Secondary | ICD-10-CM

## 2016-11-01 DIAGNOSIS — IMO0001 Reserved for inherently not codable concepts without codable children: Secondary | ICD-10-CM

## 2016-11-01 NOTE — Patient Instructions (Addendum)
Please check on coverage for jardiance; you may be able to tolerate a low dose.  Check on injection med use with work.   Recheck A1c in about 3 months prior to a visit.  Work on diet and exercise.  Take care.  Glad to see you.

## 2016-11-01 NOTE — Progress Notes (Signed)
Pre visit review using our clinic review tool, if applicable. No additional management support is needed unless otherwise documented below in the visit note. 

## 2016-11-01 NOTE — Progress Notes (Signed)
Diabetes:  Using medications without difficulties:yes Hypoglycemic episodes:no Hyperglycemic episodes: up to >200 with dietary indiscretion Feet problems:no Blood Sugars averaging: see above eye exam within last year: encouraged.   Per patient, "I haven't done what I was supposed to do" re: diet and exercise.  D/w pt about diet and exercise.   He can't keep his job and start injection medicine.  D/w pt.   We talked about invokana and he was hesitant about using the med given the amputation cautions.   He had sig weight gain with actos prev.   His fiber intake is complicated by J pouch hx, this would likely complicate use of precose.    I asked him to price check jardiance.    He has f/u about J pouch pending, routine f/u.  Has seen GI recently.   PMH and SH reviewed  Meds, vitals, and allergies reviewed.   ROS: Per HPI unless specifically indicated in ROS section   GEN: nad, alert and oriented HEENT: mucous membranes moist NECK: supple w/o LA CV: rrr. PULM: ctab, no inc wob ABD: soft, +bs EXT: no edema SKIN: no acute rash  Diabetic foot exam: Normal inspection No skin breakdown No calluses  Normal DP pulses Normal sensation to light touch and monofilament Nails normal except for L 1st nail thick

## 2016-11-04 NOTE — Assessment & Plan Note (Addendum)
>  25 minutes spent in face to face time with patient, >50% spent in counselling or coordination of care.  Per patient he hasen't done what he was supposed to do, ie he didn't stick with DM2 diet.  D/w pt about diet and exercise.   He can't keep his job and start injection medicine.  D/w pt.  I want him to recheck that issue.   We talked about invokana and he was hesitant about using the med given the amputation cautions.   He had sig weight gain with actos prev.   His fiber intake is complicated by J pouch hx, this would likely complicate use of precose.   I asked him to price check jardiance, he may be able to tolerate a low dose.   In meantime, work on diet and exercise.  Recheck in 3 months.  He'll update me about the above.  Okay for outpatient f/u.  I would have already put him on injection med, ie daily lantus or similar, but he has declined due to his work situation.  We have discussed this on mult prev occasions.

## 2016-11-25 ENCOUNTER — Other Ambulatory Visit: Payer: Self-pay | Admitting: Family Medicine

## 2016-12-20 ENCOUNTER — Ambulatory Visit (HOSPITAL_COMMUNITY)
Admission: RE | Admit: 2016-12-20 | Discharge: 2016-12-20 | Disposition: A | Discharge status: Home / Self Care | Payer: BC Managed Care – PPO | Source: Ambulatory Visit | Attending: General Surgery | Admitting: General Surgery

## 2016-12-20 ENCOUNTER — Encounter (HOSPITAL_COMMUNITY): Payer: Self-pay

## 2016-12-20 ENCOUNTER — Encounter (HOSPITAL_COMMUNITY)
Admission: RE | Disposition: A | Discharge status: Home / Self Care | Payer: Self-pay | Source: Ambulatory Visit | Attending: General Surgery

## 2016-12-20 DIAGNOSIS — Z800 Family history of malignant neoplasm of digestive organs: Secondary | ICD-10-CM | POA: Insufficient documentation

## 2016-12-20 DIAGNOSIS — Z8371 Family history of colonic polyps: Secondary | ICD-10-CM | POA: Insufficient documentation

## 2016-12-20 DIAGNOSIS — Z7984 Long term (current) use of oral hypoglycemic drugs: Secondary | ICD-10-CM | POA: Insufficient documentation

## 2016-12-20 DIAGNOSIS — E119 Type 2 diabetes mellitus without complications: Secondary | ICD-10-CM | POA: Diagnosis not present

## 2016-12-20 DIAGNOSIS — D126 Benign neoplasm of colon, unspecified: Secondary | ICD-10-CM | POA: Insufficient documentation

## 2016-12-20 DIAGNOSIS — Z85038 Personal history of other malignant neoplasm of large intestine: Secondary | ICD-10-CM | POA: Insufficient documentation

## 2016-12-20 DIAGNOSIS — Z7982 Long term (current) use of aspirin: Secondary | ICD-10-CM | POA: Insufficient documentation

## 2016-12-20 DIAGNOSIS — E039 Hypothyroidism, unspecified: Secondary | ICD-10-CM | POA: Diagnosis not present

## 2016-12-20 DIAGNOSIS — Z87891 Personal history of nicotine dependence: Secondary | ICD-10-CM | POA: Diagnosis not present

## 2016-12-20 DIAGNOSIS — Z79899 Other long term (current) drug therapy: Secondary | ICD-10-CM | POA: Diagnosis not present

## 2016-12-20 DIAGNOSIS — E785 Hyperlipidemia, unspecified: Secondary | ICD-10-CM | POA: Insufficient documentation

## 2016-12-20 DIAGNOSIS — I10 Essential (primary) hypertension: Secondary | ICD-10-CM | POA: Diagnosis not present

## 2016-12-20 HISTORY — PX: POUCHOSCOPY: SHX6321

## 2016-12-20 SURGERY — ENDOSCOPY, POUCH, SMALL INTESTINE, DIAGNOSTIC

## 2016-12-20 NOTE — Op Note (Signed)
[facility] Patient Name: Cory Harper Procedure Date: 12/20/2016 MRN: [id number] Attending MD: Leighton Ruff , MD Date of Birth: [birth year] CSN: [id number] Age: 61 Admit Type: Outpatient Procedure:                Pouchoscopy Indications:              History of total proctocolectomy, Post-operative                            assessment, History of familial polyposis, Last                            colonoscopy: 2017 Providers:                Leighton Ruff, MD, Carmie End, RN, Lillie Fragmin, RN, Cherylynn Ridges, Technician, Alan Mulder, Technician Referring MD:              Medicines:                None Complications:            No immediate complications. Estimated Blood Loss:     Estimated blood loss was minimal. Procedure:                Pre-Anesthesia Assessment:                           - Prior to the procedure, a History and Physical                            was performed, and patient medications and                            allergies were reviewed. The patient's tolerance of                            previous anesthesia was also reviewed. The risks                            and benefits of the procedure and the sedation                            options and risks were discussed with the patient.                            All questions were answered, and informed consent                            was obtained. Prior Anticoagulants: The patient has                            taken no previous anticoagulant or antiplatelet  agents. ASA Grade Assessment: II - A patient with                            mild systemic disease. After reviewing the risks                            and benefits, the patient was deemed in                            satisfactory condition to undergo the procedure.                           - Prior to the procedure, no anesthesia or sedation                            was planned.                           After obtaining informed consent, the endoscope was                            passed under direct vision. Throughout the                            procedure, the patient's blood pressure, pulse, and                            oxygen saturations were monitored continuously. The                            EC-[id number] ([id number]) scope was introduced through                            the ileoanal anastomosis via the anus and advanced                            to the J-pouch. The procedure was performed without                            difficulty. The patient tolerated the procedure                            well. Scope In: 7:41:52 AM Scope Out: 7:51:54 AM Total Procedure Duration: 0 hours 10 minutes 2 seconds  Findings:      The perianal and digital rectal examinations were normal. Pertinent       negatives include normal sphincter tone and no palpable rectal lesions.      The ileal reservoir contained multiple localized sessile, non-bleeding       polyps. The polyps were 2 mm in diameter. These polyps were removed with       a cold snare. Resection and retrieval were complete.      The exam was otherwise without abnormality. Impression:               - Multiple ileal polyps in the ileal reservoir,  removed with a cold snare. Resected and retrieved.                           - The examination was otherwise normal. Recommendation:           - Discharge patient to home (ambulatory).                           - Written discharge instructions were provided to                            the patient.                           - Resume previous diet today. Procedure Code(s):        --- Professional ---                           [id number], Endoscopic evaluation of small intestinal                            pouch (eg, Kock pouch, ileal reservoir [S or J]);                            diagnostic, including  collection of specimen(s) by                            brushing or washing, when performed (separate                            procedure)                           44799, Unlisted procedure, small intestine Diagnosis Code(s):        --- Professional ---                           D13.39, Benign neoplasm of other parts of small                            intestine                           Z90.49, Acquired absence of other specified parts                            of digestive tract                           Z09, Encounter for follow-up examination after                            completed treatment for conditions other than                            malignant neoplasm  Z86.010, Personal history of colonic polyps CPT copyright 2016 American Medical Association. All rights reserved. The codes documented in this report are preliminary and upon coder review may  be revised to meet current compliance requirements. Leighton Ruff, MD Leighton Ruff, MD 0/03/6760 8:01:54 AM This report has been signed electronically. Number of Addenda: 0

## 2016-12-20 NOTE — Discharge Instructions (Signed)
Post Colonoscopy Instructions ° °1. DIET: Follow a light bland diet the first 24 hours after arrival home, such as soup, liquids, crackers, etc.  Be sure to include lots of fluids daily.  Avoid fast food or heavy meals as your are more likely to get nauseated.   °2. You may have some mild rectal bleeding for the first few days after the procedure.  This should get less and less with time.  Resume any blood thinners 2 days after your procedure unless directed otherwise by your physician. °3. Take your usually prescribed home medications unless otherwise directed. °a. If you have any pain, it is helpful to get up and walk around, as it is usually from excess gas. °b. If this is not helpful, you can take an over-the-counter pain medication.  Choose one of the following that works best for you: °i. Naproxen (Aleve, etc)  Two 220mg tabs twice a day °ii. Ibuprofen (Advil, etc) Three 200mg tabs four times a day (every meal & bedtime) °iii. If you still have pain after using one of these, please call the office °4. It is normal to not have a bowel movement for 2-3 days after colonoscopy.   ° °5. ACTIVITIES as tolerated:   °6. You may resume regular (light) daily activities beginning the next day--such as daily self-care, walking, climbing stairs--gradually increasing activities as tolerated.  ° ° °WHEN TO CALL US [phone]: °1. Fever over 101.5 F (38.5 C)  °2. Severe abdominal or chest pain  °3. Large amount of rectal bleeding, passing multiple blood clots  °4. Dizziness or shortness of breath °5. Increasing nausea or vomiting ° ° The clinic staff is available to answer your questions during regular business hours (8:30am-5pm).  Please don’t hesitate to call and ask to speak to one of our nurses for clinical concerns.  ° If you have a medical emergency, go to the nearest emergency room or call 911. ° A surgeon from [facility] is always on call at the hospitals ° ° °[facility] °[address], [address], [place],   [address] ? °MAIN: [phone] ? TOLL FREE: [phone] ?  °FAX [phone] °[email or web address] ° ° °

## 2016-12-20 NOTE — H&P (Signed)
Cory Harper is an 61 y.o. male.   Chief Complaint: FAP HPI: here for yearly pouchoscopy.  No changes  Past Medical History:  Diagnosis Date  . Anemia   . Arthritis   . Cancer ([place])    stg II colon cancer s/p resection  . Colonic mass   . Diabetes mellitus   . FAP (familial adenomatous polyposis)   . Heart murmur   . Hyperlipidemia   . Hypertension    not on blood pressure meds since 11/13   . Hypothyroidism   . Nasal congestion     Past Surgical History:  Procedure Laterality Date  . BOWEL RESECTION  05/20/2012  . COLON RESECTION  05/20/2012   Procedure: COLON RESECTION LAPAROSCOPIC;  Surgeon: Adin Hector, MD;  [facility];  Service: General;  Laterality: N/A;  Laparoscopic Proctocolectomy, Ileal Pouch Anal Anastomoisis, Loop Ileostomy  . ESOPHAGOGASTRODUODENOSCOPY (EGD) WITH PROPOFOL N/A 04/23/2013   Procedure: ESOPHAGOGASTRODUODENOSCOPY (EGD) WITH PROPOFOL;  Surgeon: Milus Banister, MD;  [facility];  Service: Endoscopy;  Laterality: N/A;  side viewing scope  . FLEXIBLE SIGMOIDOSCOPY  08/01/2012   Procedure: FLEXIBLE SIGMOIDOSCOPY;  Surgeon: Leighton Ruff, MD;  [facility];  Service: Endoscopy;  Laterality: N/A;  use endoscope  . FLEXIBLE SIGMOIDOSCOPY N/A 11/29/2014   Procedure: FLEXIBLE SIGMOIDOSCOPY/POUCHOSCOPY;  Surgeon: Leighton Ruff, MD;  [facility];  Service: Endoscopy;  Laterality: N/A;  . ILEOSTOMY  05/20/12  . ILEOSTOMY CLOSURE N/A 09/18/2012   Procedure: Loop Ileostomy Takedown with EUA ;  Surgeon: Adin Hector, MD;  [facility];  Service: General;  Laterality: N/A;  loop ileostomy takedown with EUA   . KNEE ARTHROSCOPY  1998   Right knee  . POUCHOSCOPY  11/16/2013   Procedure: POUCHOSCOPY;  Surgeon: Leighton Ruff, MD;  [facility];  Service: Endoscopy;;  . POUCHOSCOPY N/A 11/16/2015   Procedure: POUCHOSCOPY;  Surgeon: Leighton Ruff, MD;  [facility];  Service: Endoscopy;  Laterality: N/A;  .  THYROID LOBECTOMY  05/20/2012   Procedure: THYROID LOBECTOMY;  Surgeon: Adin Hector, MD;  [facility];  Service: General;  Laterality: Left;  LEFT THYROID LOBECTOMY  . UPPER GASTROINTESTINAL ENDOSCOPY    . WISDOM TOOTH EXTRACTION      Family History  Problem Relation Age of Onset  . Uterine cancer Mother   . Hypertension Mother   . Stroke Mother   . Colon cancer Mother   . Colon polyps Mother   . Cancer Mother        colon, endometrial  . Diabetes Father   . Obesity Father   . Pneumonia Father   . Cancer Father        skin - squamous   . Diabetes Paternal Grandmother   . Prostate cancer Neg Hx    Social History:  reports that he quit smoking about 25 years ago. He has never used smokeless tobacco. He reports that he does not drink alcohol or use drugs.  Allergies:  Allergies  Allergen Reactions  . Ibuprofen Swelling    Made his joints swell with high dose    Medications Prior to Admission  Medication Sig Dispense Refill  . aspirin 81 MG tablet Take 81 mg by mouth daily.    Marland Kitchen atorvastatin (LIPITOR) 20 MG tablet take 1 tablet by mouth daily 90 tablet 0  . cholecalciferol (VITAMIN D) 1000 UNITS tablet Take 1,000 Units by mouth daily.     . ferrous sulfate 325 (65 FE) MG tablet  Take 325 mg by mouth 2 (two) times daily.  0  . fluticasone (FLONASE) 50 MCG/ACT nasal spray Place 2 sprays into both nostrils daily. 16 g 5  . glimepiride (AMARYL) 2 MG tablet take 1 tablet by mouth every morning IF BLOOD SUAGR IS LESS 90 tablet 3  . JANUVIA 100 MG tablet take 1 tablet by mouth daily 90 tablet 3  . levothyroxine (SYNTHROID, LEVOTHROID) 100 MCG tablet take 1 tablet by mouth every morning 30 tablet 5  . loperamide (IMODIUM A-D) 2 MG tablet Take 2-4 mg by mouth 2 (two) times daily as needed for diarrhea or loose stools (Goal is 4-6 BMs / day). For diarrhea    . loratadine (CLARITIN) 10 MG tablet Take 10 mg by mouth daily.     . metFORMIN (GLUCOPHAGE) 1000 MG tablet take 1 tablet  by mouth twice a day with food 60 tablet 5  . ONE TOUCH ULTRA TEST test strip CHECK SUGAR daily as directed by prescriber 100 each 3  . RA ANTI-DIARRHEAL 2 MG capsule Take 1 capsule by mouth 2 (two) times daily.  0  . vitamin B-12 (CYANOCOBALAMIN) 500 MCG tablet Take 500 mcg by mouth daily.    . Blood Glucose Monitoring Suppl (ONE TOUCH ULTRA SYSTEM KIT) W/DEVICE KIT Dispense 1 kit with 100 lancets and strips, with 3 rf on lancets and strips.  Check sugar daily.  Dx E11.9 1 each 0    No results found for this or any previous visit (from the past 12 hour(s)). No results found.  Review of Systems  Constitutional: Negative for chills and fever.  Eyes: Negative for blurred vision and double vision.  Respiratory: Negative for cough and shortness of breath.   Cardiovascular: Negative for chest pain and palpitations.  Gastrointestinal: Negative for abdominal pain, nausea and vomiting.  Genitourinary: Negative for dysuria, frequency and urgency.  Skin: Negative for itching and rash.  Neurological: Negative for dizziness.    Blood pressure 131/62, pulse 75, temperature 97.7 F (36.5 C), temperature source Oral, resp. rate 17, height _0  (1.803 m), weight 110.7 kg (244 lb), SpO2 98 %. Physical Exam  Constitutional: He is oriented to person, place, and time. He appears well-developed and well-nourished.  HENT:  Head: Normocephalic and atraumatic.  Eyes: Conjunctivae and EOM are normal. Pupils are equal, round, and reactive to light.  Neck: Normal range of motion. Neck supple.  Cardiovascular: Normal rate and regular rhythm.   Respiratory: Effort normal and breath sounds normal. No respiratory distress.  GI: Soft. Bowel sounds are normal. He exhibits no distension. There is no tenderness.  Musculoskeletal: Normal range of motion.  Neurological: He is alert and oriented to person, place, and time.  Skin: Skin is warm and dry.     Assessment/Plan Pt with FAP.  He requires surveillance for  polyps within his pouch. This will be done with min to no sedation.    Rosario Adie., MD 7/35/3299, 7:25 AM

## 2016-12-22 ENCOUNTER — Other Ambulatory Visit: Payer: Self-pay | Admitting: Family Medicine

## 2016-12-24 ENCOUNTER — Encounter: Payer: Self-pay | Admitting: Gastroenterology

## 2016-12-28 ENCOUNTER — Other Ambulatory Visit: Payer: Self-pay

## 2016-12-28 NOTE — Telephone Encounter (Signed)
Pt request new rx glimepiride 2 mg with instructions 2 tabs po in morning and 1 tab po in evening. Med list has  Take 1 tab po every morning if blood sugar is less. [place] last seen 11/01/16.Please advise.

## 2016-12-31 MED ORDER — GLIMEPIRIDE 2 MG PO TABS: ORAL_TABLET | ORAL | 3 refills | Status: DC

## 2016-12-31 NOTE — Telephone Encounter (Signed)
Sent. Thanks.   

## 2017-01-07 ENCOUNTER — Ambulatory Visit (AMBULATORY_SURGERY_CENTER): Payer: BC Managed Care – PPO | Admitting: Gastroenterology

## 2017-01-07 ENCOUNTER — Encounter: Payer: Self-pay | Admitting: Gastroenterology

## 2017-01-07 VITALS — BP 118/75 | HR 71 | Temp 98.4°F | Resp 15 | Ht 70.0 in | Wt 246.0 lb

## 2017-01-07 DIAGNOSIS — D132 Benign neoplasm of duodenum: Secondary | ICD-10-CM

## 2017-01-07 DIAGNOSIS — D126 Benign neoplasm of colon, unspecified: Secondary | ICD-10-CM

## 2017-01-07 DIAGNOSIS — Z8719 Personal history of other diseases of the digestive system: Secondary | ICD-10-CM | POA: Diagnosis present

## 2017-01-07 MED ORDER — SODIUM CHLORIDE 0.9 % IV SOLN: 500.0000 mL | INTRAVENOUS | Status: DC

## 2017-01-07 NOTE — Progress Notes (Signed)
Report given to PACU, vss 

## 2017-01-07 NOTE — Progress Notes (Signed)
Called to room to assist during endoscopic procedure.  Patient ID and intended procedure confirmed with present staff. Received instructions for my participation in the procedure from the performing physician.  

## 2017-01-07 NOTE — Op Note (Signed)
[place] Patient Name: Cory Harper Procedure Date: 01/07/2017 7:58 AM MRN: [id number] Endoscopist: Milus Banister , MD Age: 61 Referring MD:  Date of Birth: 1956-06-29 Gender: Male Account #: [id number] Procedure:                Upper GI endoscopy Indications:              Familial Adenomatous Polyposis; h/o duodenal                            adenomas; followed at [facility] for past 4 years Medicines:                Monitored Anesthesia Care Procedure:                Pre-Anesthesia Assessment:                           - Prior to the procedure, a History and Physical                            was performed, and patient medications and                            allergies were reviewed. The patient's tolerance of                            previous anesthesia was also reviewed. The risks                            and benefits of the procedure and the sedation                            options and risks were discussed with the patient.                            All questions were answered, and informed consent                            was obtained. Prior Anticoagulants: The patient has                            taken no previous anticoagulant or antiplatelet                            agents. ASA Grade Assessment: II - A patient with                            mild systemic disease. After reviewing the risks                            and benefits, the patient was deemed in                            satisfactory condition to undergo the procedure.  After obtaining informed consent, the endoscope was                            passed under direct vision. Throughout the                            procedure, the patient's blood pressure, pulse, and                            oxygen saturations were monitored continuously. The                            Endoscope was introduced through the mouth, and                            advanced to the second  part of duodenum. The upper                            GI endoscopy was accomplished without difficulty.                            The patient tolerated the procedure well. Scope In: Scope Out: Findings:                 The esophagus was normal.                           Minimal inflammation characterized by erythema and                            friability was found in the entire examined stomach.                           The mucosa at the major papilla was abnormal;                            adenomatous appearing (about 1cm across). This was                            intimately associated with the papilla, multiple                            biopsies were taken. Complications:            No immediate complications. Estimated blood loss:                            None. Estimated Blood Loss:     Estimated blood loss: none. Impression:               - Abnormal mucosa at the major papilla, biopsied.                            This is possibly adenoma in setting of FAP. Recommendation:           - Await pathology results. If recurrent adenoma  is                            confirmed, will likely refer to tertiary center for                            resection.                           - Patient has a contact number available for                            emergencies. The signs and symptoms of potential                            delayed complications were discussed with the                            patient. Return to normal activities tomorrow.                            Written discharge instructions were provided to the                            patient.                           - Resume previous diet.                           - Continue present medications. Milus Banister, MD 01/07/2017 8:19:24 AM This report has been signed electronically.

## 2017-01-07 NOTE — Patient Instructions (Signed)
YOU HAD AN ENDOSCOPIC PROCEDURE TODAY AT THE [facility]:   Refer to the procedure report that was given to you for any specific questions about what was found during the examination.  If the procedure report does not answer your questions, please call your gastroenterologist to clarify.  If you requested that your care partner not be given the details of your procedure findings, then the procedure report has been included in a sealed envelope for you to review at your convenience later.  YOU SHOULD EXPECT: Some feelings of bloating in the abdomen. Passage of more gas than usual.  Walking can help get rid of the air that was put into your GI tract during the procedure and reduce the bloating.   Please Note:  You might notice some irritation and congestion in your nose or some drainage.  This is from the oxygen used during your procedure.  There is no need for concern and it should clear up in a day or so.  SYMPTOMS TO REPORT IMMEDIATELY:   Following upper endoscopy (EGD)  Vomiting of blood or coffee ground material  New chest pain or pain under the shoulder blades  Painful or persistently difficult swallowing  New shortness of breath  Fever of 100F or higher  Black, tarry-looking stools  For urgent or emergent issues, a gastroenterologist can be reached at any hour by calling [phone].   DIET:  We do recommend a small meal at first, but then you may proceed to your regular diet.  Drink plenty of fluids but you should avoid alcoholic beverages for 24 hours.  ACTIVITY:  You should plan to take it easy for the rest of today and you should NOT DRIVE or use heavy machinery until tomorrow (because of the sedation medicines used during the test).    FOLLOW UP: Our staff will call the number listed on your records the next business day following your procedure to check on you and address any questions or concerns that you may have regarding the information given to you following  your procedure. If we do not reach you, we will leave a message.  However, if you are feeling well and you are not experiencing any problems, there is no need to return our call.  We will assume that you have returned to your regular daily activities without incident.  If any biopsies were taken you will be contacted by phone or by letter within the next 1-3 weeks.  Please call us at [phone] if you have not heard about the biopsies in 3 weeks.    SIGNATURES/CONFIDENTIALITY: You and/or your care partner have signed paperwork which will be entered into your electronic medical record.  These signatures attest to the fact that that the information above on your After Visit Summary has been reviewed and is understood.  Full responsibility of the confidentiality of this discharge information lies with you and/or your care-partner. 

## 2017-01-07 NOTE — Progress Notes (Signed)
Pt's states no medical or surgical changes since previsit or office visit. 

## 2017-01-08 ENCOUNTER — Telehealth: Payer: Self-pay | Admitting: *Deleted

## 2017-01-08 ENCOUNTER — Telehealth: Payer: Self-pay

## 2017-01-08 NOTE — Telephone Encounter (Signed)
Attempted to reach patient for post-procedure f/u call. No answer. Left message that we will attempt to reach him later today and for him to call us if he has any questions/concerns about his care.

## 2017-01-08 NOTE — Telephone Encounter (Signed)
Second attempt to contact the patient for follow up. No answer, message left for the patient.

## 2017-01-28 ENCOUNTER — Other Ambulatory Visit: Payer: Self-pay | Admitting: Family Medicine

## 2017-01-28 DIAGNOSIS — E1165 Type 2 diabetes mellitus with hyperglycemia: Principal | ICD-10-CM

## 2017-01-28 DIAGNOSIS — IMO0001 Reserved for inherently not codable concepts without codable children: Secondary | ICD-10-CM

## 2017-01-30 ENCOUNTER — Other Ambulatory Visit: Payer: Self-pay | Admitting: Family Medicine

## 2017-02-01 ENCOUNTER — Other Ambulatory Visit: Payer: BC Managed Care – PPO

## 2017-02-05 ENCOUNTER — Ambulatory Visit: Payer: BC Managed Care – PPO | Admitting: Family Medicine

## 2017-02-21 ENCOUNTER — Other Ambulatory Visit: Payer: BC Managed Care – PPO

## 2017-02-25 ENCOUNTER — Ambulatory Visit: Payer: BC Managed Care – PPO | Admitting: Family Medicine

## 2017-03-12 ENCOUNTER — Other Ambulatory Visit (INDEPENDENT_AMBULATORY_CARE_PROVIDER_SITE_OTHER): Payer: BC Managed Care – PPO

## 2017-03-12 DIAGNOSIS — IMO0001 Reserved for inherently not codable concepts without codable children: Secondary | ICD-10-CM

## 2017-03-12 DIAGNOSIS — E1165 Type 2 diabetes mellitus with hyperglycemia: Secondary | ICD-10-CM | POA: Diagnosis not present

## 2017-03-12 LAB — BASIC METABOLIC PANEL
BUN: 17 mg/dL (ref 6–23)
CHLORIDE: 100 meq/L (ref 96–112)
CO2: 28 mEq/L (ref 19–32)
Calcium: 9.6 mg/dL (ref 8.4–10.5)
Creatinine, Ser: 1.27 mg/dL (ref 0.40–1.50)
GFR: 61.56 mL/min (ref >60.00)
Glucose, Bld: 191 mg/dL — ABNORMAL HIGH (ref 70–99)
POTASSIUM: 4.5 meq/L (ref 3.5–5.1)
SODIUM: 135 meq/L (ref 135–145)

## 2017-03-12 LAB — MICROALBUMIN / CREATININE URINE RATIO
CREATININE, U: 59.3 mg/dL
MICROALB UR: 0.3 mg/dL (ref 0.0–1.9)
MICROALB/CREAT RATIO: 0.5 mg/g (ref 0.0–30.0)

## 2017-03-12 LAB — HEMOGLOBIN A1C: HEMOGLOBIN A1C: 10.7 % — AB (ref 4.6–6.5)

## 2017-03-18 ENCOUNTER — Ambulatory Visit (INDEPENDENT_AMBULATORY_CARE_PROVIDER_SITE_OTHER): Payer: BC Managed Care – PPO | Admitting: Family Medicine

## 2017-03-18 ENCOUNTER — Encounter: Payer: Self-pay | Admitting: Family Medicine

## 2017-03-18 VITALS — BP 116/66 | HR 68 | Temp 97.9°F | Wt 249.2 lb

## 2017-03-18 DIAGNOSIS — E119 Type 2 diabetes mellitus without complications: Secondary | ICD-10-CM

## 2017-03-18 DIAGNOSIS — E1165 Type 2 diabetes mellitus with hyperglycemia: Secondary | ICD-10-CM

## 2017-03-18 DIAGNOSIS — IMO0001 Reserved for inherently not codable concepts without codable children: Secondary | ICD-10-CM

## 2017-03-18 NOTE — Patient Instructions (Addendum)
Rosaria Ferries will call about your referral.  Try to see her on the way out.  Take care.  Glad to see you.  Recheck in about 3-4 months with labs prior to a physical.

## 2017-03-18 NOTE — Progress Notes (Signed)
Diabetes:  Using medications without difficulties: yes Hypoglycemic episodes: rarely, not recently.  Only if prolonged fasting.   Hyperglycemic episodes: see below.   Feet problems: no Blood Sugars averaging: 175 this AM, but up to 200s in the AM.  His sugar is better with diet adherence, "It's my fault."  eye exam within last year: due this fall, d/w pt.   A1c d/w pt.   Still up.   He still isn't all the way back on DM2 diet, d/w pt.  He is trying to walk more and get on the exercise more.   D/w pt about endo referral.   He can't use injection meds due to work restrictions.    He has f/u with [facility] pending.   Meds, vitals, and allergies reviewed.   ROS: Per HPI unless specifically indicated in ROS section   GEN: nad, alert and oriented HEENT: mucous membranes moist NECK: supple w/o LA CV: rrr. PULM: ctab, no inc wob ABD: soft, +bs EXT: no edema SKIN: no acute rash  Diabetic foot exam: Normal inspection except for varicose veins.   No skin breakdown No calluses  Normal DP pulses Normal sensation to light touch and monofilament B 1st nails thickened.

## 2017-03-19 ENCOUNTER — Telehealth: Payer: Self-pay | Admitting: Family Medicine

## 2017-03-19 NOTE — Telephone Encounter (Signed)
Pt returned your call and is requesting a cb °

## 2017-03-19 NOTE — Telephone Encounter (Signed)
Patient says that with insurance and a coupon online, it is affordable and he would like it sent to [facility], [address] [place], [place].

## 2017-03-19 NOTE — Assessment & Plan Note (Signed)
D/w pt about options.  Refer to endocrine.  He can't keep his job and start injection medicine.   We prev talked about invokana and he was hesitant about using the med given the amputation cautions.   He had sig weight gain with actos prev.   His fiber intake is complicated by J pouch hx, this would likely complicate use of precose.   I will ask him to price check jardiance, he may be able to tolerate a low dose.   I would have already put him on injection med, ie daily lantus or similar, but he has declined due to his work situation.  We have discussed this on mult prev occasions.

## 2017-03-19 NOTE — Telephone Encounter (Signed)
Left detailed message on voicemail.  

## 2017-03-19 NOTE — Telephone Encounter (Signed)
Call pt.  Since it will be awhile prior to endo eval, ask him to price check jardiance, he may be able to tolerate a low dose.   thanks .

## 2017-03-20 MED ORDER — EMPAGLIFLOZIN 10 MG PO TABS: 10.0000 mg | ORAL_TABLET | Freq: Every day | ORAL | 1 refills | Status: DC

## 2017-03-20 NOTE — Telephone Encounter (Signed)
Patient said he can be reached on his cell phone until 5:00 at  [phone]. Otherwise, call back tomorrow morning at his home number.

## 2017-03-20 NOTE — Telephone Encounter (Signed)
Pt returned call

## 2017-03-20 NOTE — Telephone Encounter (Signed)
Sent. Thanks. Let me know if he cannot tolerate it.  Would still keep endocrine appointment as planned.

## 2017-03-20 NOTE — Telephone Encounter (Signed)
Left message on voicemail for patient to call back. 

## 2017-03-20 NOTE — Addendum Note (Signed)
Addended by: Tonia Ghent on: 03/20/2017 10:39 AM   Modules accepted: Orders

## 2017-03-27 ENCOUNTER — Other Ambulatory Visit: Payer: Self-pay | Admitting: Family Medicine

## 2017-05-15 ENCOUNTER — Ambulatory Visit: Payer: BC Managed Care – PPO | Admitting: Internal Medicine

## 2017-05-23 ENCOUNTER — Other Ambulatory Visit: Payer: Self-pay | Admitting: Family Medicine

## 2017-05-24 ENCOUNTER — Ambulatory Visit (INDEPENDENT_AMBULATORY_CARE_PROVIDER_SITE_OTHER): Payer: BC Managed Care – PPO

## 2017-05-24 DIAGNOSIS — Z23 Encounter for immunization: Secondary | ICD-10-CM | POA: Diagnosis not present

## 2017-06-14 ENCOUNTER — Ambulatory Visit: Payer: BC Managed Care – PPO | Admitting: Oncology

## 2017-06-14 ENCOUNTER — Telehealth: Payer: Self-pay | Admitting: *Deleted

## 2017-06-14 ENCOUNTER — Encounter: Payer: Self-pay | Admitting: Oncology

## 2017-06-14 ENCOUNTER — Telehealth: Payer: Self-pay | Admitting: Oncology

## 2017-06-14 ENCOUNTER — Other Ambulatory Visit (HOSPITAL_BASED_OUTPATIENT_CLINIC_OR_DEPARTMENT_OTHER): Payer: BC Managed Care – PPO

## 2017-06-14 VITALS — BP 145/70 | HR 88 | Temp 98.6°F | Resp 18 | Ht 70.0 in | Wt 247.5 lb

## 2017-06-14 DIAGNOSIS — C180 Malignant neoplasm of cecum: Secondary | ICD-10-CM

## 2017-06-14 LAB — CEA (IN HOUSE-CHCC): CEA (CHCC-IN HOUSE): 1.85 ng/mL (ref 0.00–5.00)

## 2017-06-14 NOTE — Telephone Encounter (Signed)
-----   Message from Ladell Pier, MD sent at 06/14/2017  4:30 PM EST ----- Please call patient, [name] is normal

## 2017-06-14 NOTE — Progress Notes (Signed)
  [facility] OFFICE PROGRESS NOTE   Diagnosis: Colon cancer, familial polyposis  INTERVAL HISTORY:   Cory Harper returns as scheduled.  He feels well.  No difficulty with bowel function.  He underwent a pouchoscopy by Dr. Marcello Moores on 12/20/2016.  Multiple sessile polyps were noted measuring 2 mm.  The polyps were removed.  The pathology revealed tubular adenomas.  He underwent an upper endoscopy by Dr. Ardis Hughs on 01/07/2017.  He was found to have an adenoma at the  papilla.  He was referred to [facility] for resection. He underwent resection of the polyp by Dr. Lysle Rubens on 05/21/2017 and the pathology revealed a duodenal adenoma, negative for high-grade dysplasia.  He plans to continue follow-up with Dr. Lysle Rubens.  Objective:  Vital signs in last 24 hours:  Blood pressure (!) 145/70, pulse 88, temperature 98.6 F (37 C), temperature source Oral, resp. rate 18, height 5\' 10"  (1.778 m), weight 247 lb 8 oz (112.3 kg), SpO2 100 %.    HEENT: Neck without mass Lymphatics: No cervical, supraclavicular, axillary, or inguinal nodes Resp: Lungs clear bilaterally Cardio: Regular rate and rhythm GI: No hepatomegaly, no mass, nontender, yeast rash in the groin bilaterally Vascular: No leg edema   Lab Results:   Lab Results  Component Value Date   [name]1 1.71 06/15/2016   Medications: I have reviewed the patient's current medications.  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 Ileostomy takedown 09/18/2012.  2. Familial polyposis with multiple adenomatous polyps noted on the colectomy specimen 05/20/2012 -followed At [facility] and by Dr. Marcello Moores for surveillance endoscopies.  3. 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  4. Diabetes.  5. Status post endoscopic removal of a duodenal  polyp at [facility] on 06/08/2013 and 10/25/2014, removal of adenoma at the minor papilla at [facility] 05/21/2017   Disposition:  Cory Harper remains in clinical remission from colon cancer.  He continues surveillance upper endoscopy and pouchoscopy.  He would like to continue follow-up at the Cancer center.  He will return for an office visit in 1 year.  15 minutes were spent with the patient today.  The majority of the time was used for counseling and coordination of care.  Betsy Coder, MD  06/14/2017  12:20 PM

## 2017-06-14 NOTE — Telephone Encounter (Signed)
Scheduled appt per 11/9 los - Gave patient AVS and calender per los. F/u in one year.

## 2017-07-02 ENCOUNTER — Other Ambulatory Visit: Payer: Self-pay | Admitting: Family Medicine

## 2017-07-08 ENCOUNTER — Other Ambulatory Visit: Payer: Self-pay | Admitting: Family Medicine

## 2017-07-08 DIAGNOSIS — E1165 Type 2 diabetes mellitus with hyperglycemia: Secondary | ICD-10-CM

## 2017-07-12 ENCOUNTER — Ambulatory Visit (INDEPENDENT_AMBULATORY_CARE_PROVIDER_SITE_OTHER): Payer: BC Managed Care – PPO | Admitting: Internal Medicine

## 2017-07-12 ENCOUNTER — Other Ambulatory Visit: Payer: BC Managed Care – PPO

## 2017-07-12 ENCOUNTER — Encounter: Payer: Self-pay | Admitting: Internal Medicine

## 2017-07-12 VITALS — BP 154/90 | HR 105 | Ht 69.75 in | Wt 245.0 lb

## 2017-07-12 DIAGNOSIS — E1122 Type 2 diabetes mellitus with diabetic chronic kidney disease: Secondary | ICD-10-CM

## 2017-07-12 DIAGNOSIS — D34 Benign neoplasm of thyroid gland: Secondary | ICD-10-CM | POA: Diagnosis not present

## 2017-07-12 DIAGNOSIS — E119 Type 2 diabetes mellitus without complications: Secondary | ICD-10-CM | POA: Insufficient documentation

## 2017-07-12 DIAGNOSIS — N182 Chronic kidney disease, stage 2 (mild): Secondary | ICD-10-CM

## 2017-07-12 LAB — POCT GLYCOSYLATED HEMOGLOBIN (HGB A1C): HEMOGLOBIN A1C: 9.2

## 2017-07-12 MED ORDER — DULAGLUTIDE 0.75 MG/0.5ML ~~LOC~~ SOAJ: SUBCUTANEOUS | 1 refills | Status: DC

## 2017-07-12 NOTE — Addendum Note (Signed)
Addended by: Drucilla Schmidt on: 07/12/2017 01:13 PM   Modules accepted: Orders

## 2017-07-12 NOTE — Progress Notes (Signed)
Patient ID: Cory Harper, male   DOB: [birth year], 61 y.o.   MRN: [id number]   HPI: Cory Harper is a 61 y.o.-year-old male, referred by his PCP, Dr. Damita Dunnings for management of DM2, dx in 1998, non-insulin-dependent, uncontrolled, with long term complications (+ mild CKD).  Last hemoglobin A1c was: Lab Results  Component Value Date   HGBA1C 10.7 (H) 03/12/2017   HGBA1C 9.7 (H) 10/25/2016   HGBA1C 9.0 (H) 06/27/2016   Pt is on a regimen of: - Metformin 1000 mg 2x a day, with meals - Amaryl 4 mg in am and 2 mg in pm - after meals! - Jardiance 10 mg daily at night - to avoid daytime urinary frequency - started 03/2017 - Januvia 100 mg daily in am - started 2 years  Tried Actos >> signif. Weight gain On Cinnamon.  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  Pt checks his sugars 1x a day and they are: - am: 150-160s, lately 150-200 (cough med) - 2h after b'fast: n/c - before lunch: n/c - 2h after lunch: n/c - before dinner: n/c - 2h after dinner: n/c - bedtime: n/c - nighttime: n/c Lowest sugar was 125; ? hypoglycemia awareness. Highest sugar was 208.  Glucometer: One Touch Ultra mini  Pt's meals are: - Breakfast: bowl of grits, [place] bacon before starts work, juice - Lunch: [place] or ham+cheese wrap or tuna salad - Dinner: baked chicken or pork chop, green beans - Snacks: crackers He rides the exercise bike 15-30 min a night >> less likely.   Works: 5:45-9 am, 2 -5 pm, with few exceptions.  - + mild CKD, last BUN/creatinine:  Lab Results  Component Value Date   BUN 17 03/12/2017   BUN 15 06/27/2016   CREATININE 1.27 03/12/2017   CREATININE 1.31 06/27/2016   Lab Results  Component Value Date   GFRNONAA 68 (L) 09/22/2012   - + HL; last set of lipids: Lab Results  Component Value Date   CHOL 113 06/27/2016   HDL 36.30 (L) 06/27/2016   LDLCALC 41 06/27/2016   LDLDIRECT 49.4 04/27/2013   TRIG 178.0 (H) 06/27/2016   CHOLHDL 3 06/27/2016  On  Lipitor 20. On ASA 81. - last eye exam was in 07/2015: No DR.  - no numbness and tingling in his feet.  Pt has no FH of DM.  He has a h/o colon cancer >> J pouch.  He had L thyroidectomy in 05/2012 for a nodule >> Hurthle cell neoplasm, not extended.  ROS: Constitutional: no weight gain/loss, no fatigue, no subjective hyperthermia/hypothermia Eyes: no blurry vision, no xerophthalmia ENT: no sore throat, no nodules palpated in throat, no dysphagia/odynophagia, no hoarseness Cardiovascular: no CP/SOB/palpitations/leg swelling Respiratory: no cough/SOB Gastrointestinal: no N/V/D/C Musculoskeletal: no muscle/joint aches Skin: no rashes Neurological: no tremors/numbness/tingling/dizziness Psychiatric: no depression/anxiety  Past Medical History:  Diagnosis Date  . Anemia   . Arthritis   . Cancer ([place])    stg II colon cancer s/p resection  . Colonic mass   . Diabetes mellitus   . FAP (familial adenomatous polyposis)   . Heart murmur   . Hyperlipidemia   . Hypertension    not on blood pressure meds since 11/13   . Hypothyroidism   . Nasal congestion    Past Surgical History:  Procedure Laterality Date  . BOWEL RESECTION  05/20/2012  . COLON RESECTION  05/20/2012   Procedure: COLON RESECTION LAPAROSCOPIC;  Surgeon: Adin Hector, MD;  Location: [facility];  Service: General;  Laterality: N/A;  Laparoscopic Proctocolectomy, Ileal Pouch Anal Anastomoisis, Loop Ileostomy  . ESOPHAGOGASTRODUODENOSCOPY (EGD) WITH PROPOFOL N/A 04/23/2013   Procedure: ESOPHAGOGASTRODUODENOSCOPY (EGD) WITH PROPOFOL;  Surgeon: Milus Banister, MD;  Location: [facility];  Service: Endoscopy;  Laterality: N/A;  side viewing scope  . FLEXIBLE SIGMOIDOSCOPY  08/01/2012   Procedure: FLEXIBLE SIGMOIDOSCOPY;  Surgeon: Leighton Ruff, MD;  Location: [facility];  Service: Endoscopy;  Laterality: N/A;  use endoscope  . FLEXIBLE SIGMOIDOSCOPY N/A 11/29/2014   Procedure: FLEXIBLE SIGMOIDOSCOPY/POUCHOSCOPY;   Surgeon: Leighton Ruff, MD;  Location: [facility];  Service: Endoscopy;  Laterality: N/A;  . ILEOSTOMY  05/20/12  . ILEOSTOMY CLOSURE N/A 09/18/2012   Procedure: Loop Ileostomy Takedown with EUA ;  Surgeon: Adin Hector, MD;  Location: [facility];  Service: General;  Laterality: N/A;  loop ileostomy takedown with EUA   . KNEE ARTHROSCOPY  1998   Right knee  . POUCHOSCOPY  11/16/2013   Procedure: POUCHOSCOPY;  Surgeon: Leighton Ruff, MD;  Location: [facility];  Service: Endoscopy;;  . POUCHOSCOPY N/A 11/16/2015   Procedure: POUCHOSCOPY;  Surgeon: Leighton Ruff, MD;  Location: [facility];  Service: Endoscopy;  Laterality: N/A;  . POUCHOSCOPY N/A 12/20/2016   Procedure: POUCHOSCOPY;  Surgeon: Leighton Ruff, MD;  Location: [facility];  Service: Endoscopy;  Laterality: N/A;  . THYROID LOBECTOMY  05/20/2012   Procedure: THYROID LOBECTOMY;  Surgeon: Adin Hector, MD;  Location: [facility];  Service: General;  Laterality: Left;  LEFT THYROID LOBECTOMY  . UPPER GASTROINTESTINAL ENDOSCOPY    . WISDOM TOOTH EXTRACTION     Social History   Socioeconomic History  . Marital status: Married    Spouse name: June  . Number of children: 0  Social Needs  Occupational History  . 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 Smoking status: Former Smoker    Last attempt to quit: 08/07/1991    Years since quitting: 25.9  . Smokeless tobacco: Never Used  . Tobacco comment: quit in 1993  Substance and Sexual Activity  . Alcohol use: No  . Drug use: No  . Sexual activity: Not on file  Other Topics Concern  . 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 aspirin 81 MG tablet Take 81 mg by mouth daily.     Marland Kitchen atorvastatin (LIPITOR) 20 MG tablet Take 20 mg by mouth.    . Blood Glucose Monitoring Suppl (ONE TOUCH ULTRA SYSTEM KIT) W/DEVICE KIT Dispense 1 kit with 100 lancets and strips, with 3 rf on lancets and strips.  Check sugar daily.  Dx E11.9 1 each 0  . cholecalciferol (VITAMIN D) 1000 UNITS tablet Take 1,000 Units by mouth daily.     . Cholecalciferol (VITAMIN D-1000 MAX ST) 1000 units tablet Take by mouth.    . ferrous sulfate 325 (65 FE) MG tablet Take 325 mg by mouth 2 (two) times daily.  0  . fluticasone (FLONASE) 50 MCG/ACT nasal spray Place 2 sprays into both nostrils daily. 16 g 5  . fluticasone (FLONASE) 50 MCG/ACT nasal spray 1 spray by Each Nare route daily.    Marland Kitchen glimepiride (  AMARYL) 2 MG tablet 2 tabs in the AM and 1 tab in the PM 270 tablet 3  . JANUVIA 100 MG tablet take 1 tablet by mouth once daily 90 tablet 0  . JARDIANCE 10 MG TABS tablet take 1 tablet by mouth once daily 30 tablet 1  . levothyroxine (SYNTHROID, LEVOTHROID) 100 MCG tablet take 1 tablet by mouth every morning 30 tablet 5  . loperamide (IMODIUM A-D) 2 MG tablet Take 2-4 mg by mouth 2 (two) times daily as needed for diarrhea or loose stools (Goal is 4-6 BMs / day). For diarrhea    . loratadine (CLARITIN) 10 MG tablet Take 10 mg by mouth daily.     . metFORMIN (GLUCOPHAGE) 1000 MG tablet take 1 tablet by mouth twice a day with food 60 tablet 5  . ONE TOUCH ULTRA TEST test strip CHECK SUGAR daily as directed by prescriber 100 each 3  . RA ANTI-DIARRHEAL 2 MG capsule Take 1 capsule by mouth 2 (two) times daily.  0  . vitamin B-12 (CYANOCOBALAMIN) 500 MCG tablet Take 500 mcg by mouth daily.     Current Facility-Administered Medications on File Prior to Visit  Medication Dose Route Frequency Provider Last Rate Last Dose  . 0.9 %  sodium chloride infusion  500 mL Intravenous Continuous Milus Banister, MD       Allergies  Allergen Reactions  . Ibuprofen Swelling    Made his joints swell with high dose   Family  History  Problem Relation Age of Onset  . Uterine cancer Mother   . Hypertension Mother   . Stroke Mother   . Colon cancer Mother   . Colon polyps Mother   . Cancer Mother        colon, endometrial  . Diabetes Father   . Obesity Father   . Pneumonia Father   . Cancer Father        skin - squamous   . Diabetes Paternal Grandmother   . Prostate cancer Neg Hx     PE: BP (!) 154/90   Pulse (!) 105   Ht 5' 9.75" (1.772 m)   Wt 245 lb (111.1 kg)   SpO2 99%   BMI 35.41 kg/m  Wt Readings from Last 3 Encounters:  07/12/17 245 lb (111.1 kg)  06/14/17 247 lb 8 oz (112.3 kg)  03/18/17 249 lb 4 oz (113.1 kg)   Constitutional: obese, in NAD Eyes: PERRLA, EOMI, no exophthalmos ENT: moist mucous membranes, no thyromegaly, no cervical lymphadenopathy Cardiovascular: tachycardia, RR, No MRG Respiratory: CTA B Gastrointestinal: abdomen soft, NT, ND, BS+ Musculoskeletal: no deformities, strength intact in all 4 Skin: moist, warm, no rashes Neurological: no tremor with outstretched hands, DTR normal in all 4  ASSESSMENT: 1. DM2, non-insulin-dependent, uncontrolled, with complications - CKD stage 2  2. H/o Hurthle cell neoplasm  PLAN:  1. Patient with long-standing, uncontrolled diabetes, on oral antidiabetic regimen, which became insufficient - HbA1c 9.2% today. - he only checks sugars in am>> higher than target, despite improvement after addition of IJardiance. He is on a lower Jardiance dose and he takes this at night to avoid increased urination during work hours. Will continue this. - will also continue Metformin and Amaryl but move Amaryl to before meals - as we need to avoid insulin 2/2 job >> will try a GLP1 R agonist >> Trulicity - explained benefits and SEs. Given coupon card. - we also discussed at length about diet >> made specific changes. He refuses a  nutrition referral now. He did see a nutritionist in the past.  - I suggested to:  Patient Instructions  Please  continue: - Metformin 1000 mg 2x a day, with meals - Amaryl 4 mg in am and 2 mg in pm - Jardiance 10 mg daily in am  Please stop Januvia 1 week after starting Trulicity.  Please start Trulicity 5.09 mg weekly. Let me know when you are close to running out to call in the higher dose to your pharmacy (1.5 mg).  Please stop juice.  Try to reduce meat and cheese - target: 2 meatless days a week.  Please return in 1.5 months with your sugar log.   - Strongly advised him to start checking sugars at different times of the day - check 1-2 times a day, rotating checks - given sugar log and advised how to fill it and to bring it at next appt  - given foot care handout and explained the principles  - given instructions for hypoglycemia management "15-15 rule"  - advised for yearly eye exams >> he is due - Return to clinic in 1.5 mo with sugar log   2. H/o Hurthle cell neoplasm - reviewed pathology of the L thyroidectomy with the pt - will repeat the U/S at next visit to look at the R lobe - last TSH normal Lab Results  Component Value Date   TSH 2.29 06/27/2016    Philemon Kingdom, MD PhD [facility] Endocrinology

## 2017-07-12 NOTE — Patient Instructions (Addendum)
Please continue: - Metformin 1000 mg 2x a day, with meals - Amaryl 4 mg in am and 2 mg in pm - Jardiance 10 mg daily in am  Please stop Januvia 1 week after starting Trulicity.  Please start Trulicity 3.08 mg weekly. Let me know when you are close to running out to call in the higher dose to your pharmacy (1.5 mg).  Please stop juice.  Try to reduce meat and cheese - target: 2 meatless days a week.  Please return in 1.5 months with your sugar log.   PATIENT INSTRUCTIONS FOR TYPE 2 DIABETES:  **Please join MyChart!** - see attached instructions about how to join if you have not done so already.  DIET AND EXERCISE Diet and exercise is an important part of diabetic treatment.  We recommended aerobic exercise in the form of brisk walking (working between 40-60% of maximal aerobic capacity, similar to brisk walking) for 150 minutes per week (such as 30 minutes five days per week) along with 3 times per week performing 'resistance' training (using various gauge rubber tubes with handles) 5-10 exercises involving the major muscle groups (upper body, lower body and core) performing 10-15 repetitions (or near fatigue) each exercise. Start at half the above goal but build slowly to reach the above goals. If limited by weight, joint pain, or disability, we recommend daily walking in a swimming pool with water up to waist to reduce pressure from joints while allow for adequate exercise.    BLOOD GLUCOSES Monitoring your blood glucoses is important for continued management of your diabetes. Please check your blood glucoses 2-4 times a day: fasting, before meals and at bedtime (you can rotate these measurements - e.g. one day check before the 3 meals, the next day check before 2 of the meals and before bedtime, etc.).   HYPOGLYCEMIA (low blood sugar) Hypoglycemia is usually a reaction to not eating, exercising, or taking too much insulin/ other diabetes drugs.  Symptoms include tremors, sweating,  hunger, confusion, headache, etc. Treat IMMEDIATELY with 15 grams of Carbs: . 4 glucose tablets .  cup regular juice/soda . 2 tablespoons raisins . 4 teaspoons sugar . 1 tablespoon honey Recheck blood glucose in 15 mins and repeat above if still symptomatic/blood glucose <100.  RECOMMENDATIONS TO REDUCE YOUR RISK OF DIABETIC COMPLICATIONS: * Take your prescribed MEDICATION(S) * Follow a DIABETIC diet: Complex carbs, fiber rich foods, (monounsaturated and polyunsaturated) fats * AVOID saturated/trans fats, high fat foods, >2,300 mg salt per day. * EXERCISE at least 5 times a week for 30 minutes or preferably daily.  * DO NOT SMOKE OR DRINK more than 1 drink a day. * Check your FEET every day. Do not wear tightfitting shoes. Contact us if you develop an ulcer * See your EYE doctor once a year or more if needed * Get a FLU shot once a year * Get a PNEUMONIA vaccine once before and once after age 44 years  GOALS:  * Your Hemoglobin A1c of <7%  * fasting sugars need to be <130 * after meals sugars need to be <180 (2h after you start eating) * Your Systolic BP should be 657 or lower  * Your Diastolic BP should be 80 or lower  * Your HDL (Good Cholesterol) should be 40 or higher  * Your LDL (Bad Cholesterol) should be 100 or lower. * Your Triglycerides should be 150 or lower  * Your Urine microalbumin (kidney function) should be <30 * Your Body Mass Index should be  25 or lower    Please consider the following ways to cut down carbs and fat and increase fiber and micronutrients in your diet: - substitute whole grain for white bread or pasta - substitute brown rice for white rice - substitute 90-calorie flat bread pieces for slices of bread when possible - substitute sweet potatoes or yams for white potatoes - substitute humus for margarine - substitute tofu for cheese when possible - substitute almond or rice milk for regular milk (would not drink soy milk daily due to concern for  soy estrogen influence on breast cancer risk) - substitute dark chocolate for other sweets when possible - substitute water - can add lemon or orange slices for taste - for diet sodas (artificial sweeteners will trick your body that you can eat sweets without getting calories and will lead you to overeating and weight gain in the long run) - do not skip breakfast or other meals (this will slow down the metabolism and will result in more weight gain over time)  - can try smoothies made from fruit and almond/rice milk in am instead of regular breakfast - can also try old-fashioned (not instant) oatmeal made with almond/rice milk in am - order the dressing on the side when eating salad at a restaurant (pour less than half of the dressing on the salad) - eat as little meat as possible - can try juicing, but should not forget that juicing will get rid of the fiber, so would alternate with eating raw veg./fruits or drinking smoothies - use as little oil as possible, even when using olive oil - can dress a salad with a mix of balsamic vinegar and lemon juice, for e.g. - use agave nectar, stevia sugar, or regular sugar rather than artificial sweateners - steam or broil/roast veggies  - snack on veggies/fruit/nuts (unsalted, preferably) when possible, rather than processed foods - reduce or eliminate aspartame in diet (it is in diet sodas, chewing gum, etc) Read the labels!  Try to read Dr. Janene Harvey book: "Program for Reversing Diabetes" for other ideas for healthy eating.

## 2017-07-19 ENCOUNTER — Encounter: Payer: BC Managed Care – PPO | Admitting: Family Medicine

## 2017-07-25 ENCOUNTER — Other Ambulatory Visit (INDEPENDENT_AMBULATORY_CARE_PROVIDER_SITE_OTHER): Payer: BC Managed Care – PPO

## 2017-07-25 DIAGNOSIS — E1165 Type 2 diabetes mellitus with hyperglycemia: Secondary | ICD-10-CM

## 2017-07-25 LAB — HEMOGLOBIN A1C: Hgb A1c MFr Bld: 9 % — ABNORMAL HIGH (ref 4.6–6.5)

## 2017-07-25 LAB — COMPREHENSIVE METABOLIC PANEL
ALK PHOS: 60 U/L (ref 39–117)
ALT: 19 U/L (ref 0–53)
AST: 19 U/L (ref 0–37)
Albumin: 3.9 g/dL (ref 3.5–5.2)
BUN: 21 mg/dL (ref 6–23)
CHLORIDE: 103 meq/L (ref 96–112)
CO2: 30 mEq/L (ref 19–32)
CREATININE: 1.24 mg/dL (ref 0.40–1.50)
Calcium: 9.1 mg/dL (ref 8.4–10.5)
GFR: 63.21 mL/min (ref >60.00)
Glucose, Bld: 108 mg/dL — ABNORMAL HIGH (ref 70–99)
Potassium: 4 mEq/L (ref 3.5–5.1)
SODIUM: 137 meq/L (ref 135–145)
TOTAL PROTEIN: 7.2 g/dL (ref 6.0–8.3)
Total Bilirubin: 0.7 mg/dL (ref 0.2–1.2)

## 2017-07-25 LAB — LIPID PANEL
CHOLESTEROL: 105 mg/dL (ref 0–200)
HDL: 36.7 mg/dL — ABNORMAL LOW (ref >39.00)
LDL Cholesterol: 35 mg/dL (ref 0–99)
NonHDL: 67.82
Total CHOL/HDL Ratio: 3
Triglycerides: 164 mg/dL — ABNORMAL HIGH (ref 0.0–149.0)
VLDL: 32.8 mg/dL (ref 0.0–40.0)

## 2017-07-25 LAB — TSH: TSH: 3.46 u[IU]/mL (ref 0.35–4.50)

## 2017-08-02 ENCOUNTER — Other Ambulatory Visit: Payer: Self-pay | Admitting: Family Medicine

## 2017-08-02 ENCOUNTER — Encounter: Payer: Self-pay | Admitting: Family Medicine

## 2017-08-02 ENCOUNTER — Ambulatory Visit (INDEPENDENT_AMBULATORY_CARE_PROVIDER_SITE_OTHER): Payer: BC Managed Care – PPO | Admitting: Family Medicine

## 2017-08-02 VITALS — BP 132/62 | HR 72 | Temp 98.1°F | Ht 70.0 in | Wt 247.5 lb

## 2017-08-02 DIAGNOSIS — E7800 Pure hypercholesterolemia, unspecified: Secondary | ICD-10-CM

## 2017-08-02 DIAGNOSIS — Z0000 Encounter for general adult medical examination without abnormal findings: Secondary | ICD-10-CM | POA: Diagnosis not present

## 2017-08-02 DIAGNOSIS — N182 Chronic kidney disease, stage 2 (mild): Secondary | ICD-10-CM

## 2017-08-02 DIAGNOSIS — E1122 Type 2 diabetes mellitus with diabetic chronic kidney disease: Secondary | ICD-10-CM

## 2017-08-02 DIAGNOSIS — E039 Hypothyroidism, unspecified: Secondary | ICD-10-CM

## 2017-08-02 MED ORDER — EMPAGLIFLOZIN 10 MG PO TABS: 10.0000 mg | ORAL_TABLET | Freq: Every day | ORAL | 3 refills | Status: DC

## 2017-08-02 NOTE — Progress Notes (Signed)
CPE- See plan.  Routine anticipatory guidance given to patient.  See health maintenance.  The possibility exists that previously documented standard health maintenance information may have been brought forward from a previous encounter into this note.  If needed, that same information has been updated to reflect the current situation based on today's encounter.    Tetanus 2009 Flu done 2018 PNA 2014 Shingles d/w pt.   Colon cancer screening done 2018 Prostate cancer screening and PSA options (with potential risks and benefits of testing vs not testing) were discussed along with recent recs/guidelines.  He declined testing PSA at this point. Living will d/w pt.  Wife designated if patient were incapacitated.   Diet and exercise d/w pt.  HIV and HCV prev checked 2017.    DM2.  Due for eye exam, d/w pt.  D/w pt about diet and exercise- "I'm getting better."  He is getting more vegetables, as tolerated.  A1c some better.  He is using his stationary bike.  Sugar has ~140s in the AM recent, occ down to ~100.    Hypothyroid.  No neck mass.  No dysphagia.  Compliant.  TSH wnl.    Elevated Cholesterol: Using medications without problems:yes Muscle aches: likely not from statin, d/w pt.  Diet compliance:encouraged.  Exercise:encouraged.   PMH and SH reviewed  Meds, vitals, and allergies reviewed.   ROS: Per HPI.  Unless specifically indicated otherwise in HPI, the patient denies:  General: fever. Eyes: acute vision changes ENT: sore throat Cardiovascular: chest pain Respiratory: SOB GI: vomiting GU: dysuria Musculoskeletal: acute back pain Derm: acute rash Neuro: acute motor dysfunction Psych: worsening mood Endocrine: polydipsia Heme: bleeding Allergy: hayfever  GEN: nad, alert and oriented HEENT: mucous membranes moist NECK: supple w/o LA CV: rrr. PULM: ctab, no inc wob ABD: soft, +bs EXT: no edema SKIN: no acute rash

## 2017-08-02 NOTE — Patient Instructions (Addendum)
Check with your insurance to see if they will cover the shingrix shot. Don't change your meds otherwise.   I'll await the notes from endocrine.  Take care.  Glad to see you.

## 2017-08-04 NOTE — Assessment & Plan Note (Signed)
Tetanus 2009 Flu done 2018 PNA 2014 Shingles d/w pt.   Colon cancer screening done 2018 Prostate cancer screening and PSA options (with potential risks and benefits of testing vs not testing) were discussed along with recent recs/guidelines.  He declined testing PSA at this point. Living will d/w pt.  Wife designated if patient were incapacitated.   Diet and exercise d/w pt.  HIV and HCV prev checked 2017.

## 2017-08-04 NOTE — Assessment & Plan Note (Signed)
LDL is controlled.  Continue work on diet and exercise for triglyceride elevation.  Continue statin.  Update me as needed.  Labs discussed with patient.

## 2017-08-04 NOTE — Assessment & Plan Note (Signed)
He will call about eye exam.  A1c not at goal but improved.  Continue work on diet and exercise.  He will follow-up with endocrinology.  No change in meds at this point.

## 2017-08-04 NOTE — Assessment & Plan Note (Signed)
No neck mass.  No dysphagia.  Compliant.  TSH wnl.   Continue as is.

## 2017-08-21 ENCOUNTER — Telehealth: Payer: Self-pay | Admitting: Internal Medicine

## 2017-08-21 NOTE — Telephone Encounter (Signed)
Please advise on below  

## 2017-08-21 NOTE — Telephone Encounter (Signed)
I can see him at 8 am this Fri, 08/23/17.

## 2017-08-21 NOTE — Telephone Encounter (Signed)
He can not make that appt either. He works until [employer] and then has to be back by 1:30pm.

## 2017-08-21 NOTE — Telephone Encounter (Signed)
12:15 that day?

## 2017-08-21 NOTE — Telephone Encounter (Signed)
Pt called to set up follow up from his new patient visit in Dec. He was put on march 29th but wanted to check to see if she needed him in any sooner.  He stated was suppose to be coming in 1 1/2 months after his visit with his sugar logs.   Please advise

## 2017-08-21 NOTE — Telephone Encounter (Signed)
I am not sure what hours would work for him. I do not see any other openings for now, but will keep an eye on this.

## 2017-08-21 NOTE — Telephone Encounter (Signed)
Pt can not make that appt

## 2017-08-21 NOTE — Telephone Encounter (Signed)
Pt is aware.  

## 2017-09-02 ENCOUNTER — Other Ambulatory Visit: Payer: Self-pay

## 2017-09-02 MED ORDER — DULAGLUTIDE 0.75 MG/0.5ML ~~LOC~~ SOAJ: SUBCUTANEOUS | 1 refills | Status: DC

## 2017-10-02 ENCOUNTER — Other Ambulatory Visit: Payer: Self-pay | Admitting: Internal Medicine

## 2017-10-02 ENCOUNTER — Other Ambulatory Visit: Payer: Self-pay | Admitting: Family Medicine

## 2017-10-15 ENCOUNTER — Other Ambulatory Visit: Payer: Self-pay | Admitting: Family Medicine

## 2017-10-24 ENCOUNTER — Other Ambulatory Visit: Payer: Self-pay | Admitting: Family Medicine

## 2017-10-24 DIAGNOSIS — N182 Chronic kidney disease, stage 2 (mild): Principal | ICD-10-CM

## 2017-10-24 DIAGNOSIS — E1122 Type 2 diabetes mellitus with diabetic chronic kidney disease: Secondary | ICD-10-CM

## 2017-10-25 ENCOUNTER — Other Ambulatory Visit: Payer: BC Managed Care – PPO

## 2017-10-31 ENCOUNTER — Ambulatory Visit: Payer: BC Managed Care – PPO | Admitting: Family Medicine

## 2017-11-01 ENCOUNTER — Ambulatory Visit: Payer: BC Managed Care – PPO | Admitting: Internal Medicine

## 2017-11-01 ENCOUNTER — Encounter: Payer: Self-pay | Admitting: Internal Medicine

## 2017-11-01 VITALS — BP 154/86 | HR 70 | Ht 70.0 in | Wt 243.0 lb

## 2017-11-01 DIAGNOSIS — N182 Chronic kidney disease, stage 2 (mild): Secondary | ICD-10-CM

## 2017-11-01 DIAGNOSIS — D34 Benign neoplasm of thyroid gland: Secondary | ICD-10-CM

## 2017-11-01 DIAGNOSIS — E1122 Type 2 diabetes mellitus with diabetic chronic kidney disease: Secondary | ICD-10-CM | POA: Diagnosis not present

## 2017-11-01 LAB — POCT GLYCOSYLATED HEMOGLOBIN (HGB A1C): Hemoglobin A1C: 9.3

## 2017-11-01 MED ORDER — DULAGLUTIDE 1.5 MG/0.5ML ~~LOC~~ SOAJ: SUBCUTANEOUS | 11 refills | Status: DC

## 2017-11-01 MED ORDER — GLIMEPIRIDE 2 MG PO TABS: ORAL_TABLET | ORAL | 3 refills | Status: DC

## 2017-11-01 NOTE — Patient Instructions (Addendum)
Please continue: - Metformin 1000 mg 2x a day, with meals - Jardiance 10 mg daily at night - Amaryl 4 mg in am and 2 mg in pm  Please increase: - Trulicity to 1.5 mg weekly  Please return in 3 months with your sugar log.

## 2017-11-01 NOTE — Progress Notes (Signed)
Patient ID: Cory Harper, male   DOB: 27-Sep-1955, 62 y.o.   MRN: [id number]   HPI: Cory Harper is a 62 y.o.-year-old male, returning for follow-up for DM2, dx in 1998, non-insulin-dependent, uncontrolled, with long term complications (+ mild CKD). Last visit 3 mo ago.  Last hemoglobin A1c was: Lab Results  Component Value Date   HGBA1C 9.0 (H) 07/25/2017   HGBA1C 9.2 07/12/2017   HGBA1C 10.7 (H) 03/12/2017   Pt is on a regimen of: - Metformin 1000 mg 2x a day, with meals - Amaryl 4 mg in am and 2 mg in pm - moved before meals 07/2017 - Jardiance 10 mg daily at night - to avoid daytime urinary frequency - started 03/2017 - Januvia 100 mg daily in am >> Trulicity 8.18 mg weekly - started 07/2017 -he did not call to have the Trulicity dose increased, as discussed Tried Actos >> signif. Weight gain On Cinnamon.  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  Pt checks his sugars once a day: - am: 150-160s, lately 150-200 (cough med) >> 161-180 - 2h after b'fast: n/c - before lunch: n/c >> 118-180 - 2h after lunch: n/c >> 171, 178 - before dinner: n/c >> 132-178 - 2h after dinner: n/c >> 163, 180 - bedtime: n/c >> 160-180 - nighttime: n/c >> 180 Lowest sugar was 125 >> 118; it is unclear at which level he has hypoglycemia awareness Highest sugar was 208 >> 180.  Glucometer: One Touch Ultra mini  Pt's meals are: - Breakfast: bowl of grits, [place] bacon before starts work, juice - Lunch: [place] or ham+cheese wrap or tuna salad - Dinner: baked chicken or pork chop, green beans - Snacks: crackers He exercises by riding the bike 15-30 minutes at night.  Works: 5:45-9 am, 2 -5 pm, with few exceptions.   - + Mild CKD, last BUN/creatinine:  Lab Results  Component Value Date   BUN 21 07/25/2017   BUN 17 03/12/2017   CREATININE 1.24 07/25/2017   CREATININE 1.27 03/12/2017   Lab Results  Component Value Date   GFRNONAA 68 (L) 09/22/2012   -+ HL; last set of  lipids: Lab Results  Component Value Date   CHOL 105 07/25/2017   HDL 36.70 (L) 07/25/2017   LDLCALC 35 07/25/2017   LDLDIRECT 49.4 04/27/2013   TRIG 164.0 (H) 07/25/2017   CHOLHDL 3 07/25/2017  On Lipitor 20. On ASA 81. - last eye exam was in 1 07/2015: No DR - no numbness and tingling in his feet.  Pt has no FH of DM.  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  ROS: Constitutional: no weight gain/no weight loss, no fatigue, no subjective hyperthermia, no subjective hypothermia Eyes: no blurry vision, no xerophthalmia ENT: no sore throat, no nodules palpated in throat, no dysphagia, no odynophagia, no hoarseness Cardiovascular: no CP/no SOB/no palpitations/no leg swelling Respiratory: no cough/no SOB/no wheezing Gastrointestinal: no N/no V/no D/no C/no acid reflux Musculoskeletal: no muscle aches/no joint aches Skin: no rashes, no hair loss Neurological: no tremors/no numbness/no tingling/no dizziness  I reviewed pt's medications, allergies, PMH, social hx, family hx, and changes were documented in the history of present illness. Otherwise, unchanged from my initial visit note.  Past Medical History:  Diagnosis Date  . Anemia   . Arthritis   .  Cancer ([place])    stg II colon cancer s/p resection  . Colonic mass   . Diabetes mellitus   . FAP (familial adenomatous polyposis)   . Heart murmur   . Hyperlipidemia   . Hypertension    not on blood pressure meds since 11/13   . Hypothyroidism   . Nasal congestion    Past Surgical History:  Procedure Laterality Date  . BOWEL RESECTION  05/20/2012  . COLON RESECTION  05/20/2012   Procedure: COLON RESECTION LAPAROSCOPIC;  Surgeon: Adin Hector, MD;  Location: [facility];  Service: General;  Laterality: N/A;  Laparoscopic Proctocolectomy, Ileal Pouch Anal Anastomoisis, Loop  Ileostomy  . ESOPHAGOGASTRODUODENOSCOPY (EGD) WITH PROPOFOL N/A 04/23/2013   Procedure: ESOPHAGOGASTRODUODENOSCOPY (EGD) WITH PROPOFOL;  Surgeon: Milus Banister, MD;  Location: [facility];  Service: Endoscopy;  Laterality: N/A;  side viewing scope  . FLEXIBLE SIGMOIDOSCOPY  08/01/2012   Procedure: FLEXIBLE SIGMOIDOSCOPY;  Surgeon: Leighton Ruff, MD;  Location: [facility];  Service: Endoscopy;  Laterality: N/A;  use endoscope  . FLEXIBLE SIGMOIDOSCOPY N/A 11/29/2014   Procedure: FLEXIBLE SIGMOIDOSCOPY/POUCHOSCOPY;  Surgeon: Leighton Ruff, MD;  Location: [facility];  Service: Endoscopy;  Laterality: N/A;  . ILEOSTOMY  05/20/12  . ILEOSTOMY CLOSURE N/A 09/18/2012   Procedure: Loop Ileostomy Takedown with EUA ;  Surgeon: Adin Hector, MD;  Location: [facility];  Service: General;  Laterality: N/A;  loop ileostomy takedown with EUA   . KNEE ARTHROSCOPY  1998   Right knee  . POUCHOSCOPY  11/16/2013   Procedure: POUCHOSCOPY;  Surgeon: Leighton Ruff, MD;  Location: [facility];  Service: Endoscopy;;  . POUCHOSCOPY N/A 11/16/2015   Procedure: POUCHOSCOPY;  Surgeon: Leighton Ruff, MD;  Location: [facility];  Service: Endoscopy;  Laterality: N/A;  . POUCHOSCOPY N/A 12/20/2016   Procedure: POUCHOSCOPY;  Surgeon: Leighton Ruff, MD;  Location: [facility];  Service: Endoscopy;  Laterality: N/A;  . THYROID LOBECTOMY  05/20/2012   Procedure: THYROID LOBECTOMY;  Surgeon: Adin Hector, MD;  Location: [facility];  Service: General;  Laterality: Left;  LEFT THYROID LOBECTOMY  . UPPER GASTROINTESTINAL ENDOSCOPY    . WISDOM TOOTH EXTRACTION     Social History   Socioeconomic History  . Marital status: Married    Spouse name: Cory Harper  . Number of children: 0  Social Needs  Occupational History  . 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 Smoking status: Former Smoker    Last  attempt to quit: 08/07/1991    Years since quitting: 25.9  . Smokeless tobacco: Never Used  . Tobacco comment: quit in 1993  Substance and Sexual Activity  . Alcohol use: No  . Drug use: No  . Sexual activity: Not on file  Other Topics Concern  . 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 aspirin 81 MG tablet Take 81 mg by mouth daily.    Marland Kitchen atorvastatin (LIPITOR) 20 MG tablet Take 20 mg by mouth.    . Blood Glucose Monitoring Suppl (ONE TOUCH ULTRA SYSTEM KIT) W/DEVICE KIT Dispense 1 kit with 100 lancets and strips, with 3 rf on lancets and strips.  Check  sugar daily.  Dx E11.9 1 each 0  . cholecalciferol (VITAMIN D) 1000 UNITS tablet Take 1,000 Units by mouth daily.     . empagliflozin (JARDIANCE) 10 MG TABS tablet Take 10 mg by mouth daily. 30 tablet 3  . ferrous sulfate 325 (65 FE) MG tablet Take 325 mg by mouth 2 (two) times daily.  0  . fluticasone (FLONASE) 50 MCG/ACT nasal spray Place 2 sprays into both nostrils daily. 16 g 5  . glimepiride (AMARYL) 2 MG tablet 2 tabs in the AM and 1 tab in the PM 270 tablet 3  . levothyroxine (SYNTHROID, LEVOTHROID) 100 MCG tablet TAKE 1 TABLET BY MOUTH EVERY MORNING 30 tablet 7  . loperamide (IMODIUM A-D) 2 MG tablet Take 2-4 mg by mouth 2 (two) times daily as needed for diarrhea or loose stools (Goal is 4-6 BMs / day). For diarrhea    . loratadine (CLARITIN) 10 MG tablet Take 10 mg by mouth daily.     . metFORMIN (GLUCOPHAGE) 1000 MG tablet TAKE 1 TABLET BY MOUTH TWICE A DAY WITH FOOD 60 tablet 5  . ONE TOUCH ULTRA TEST test strip CHECK SUGAR daily as directed by prescriber 100 each 3  . TRULICITY 5.27 PO/2.4MP SOPN INJECT 0.75 MILLIGRAMS SUBCUTANEOUSLY EVERY WEEK 2 mL 0  . vitamin B-12 (CYANOCOBALAMIN) 500 MCG tablet Take 500 mcg by mouth daily.     Current  Facility-Administered Medications on File Prior to Visit  Medication Dose Route Frequency Provider Last Rate Last Dose  . 0.9 %  sodium chloride infusion  500 mL Intravenous Continuous Milus Banister, MD       Allergies  Allergen Reactions  . Ibuprofen Swelling    Made his joints swell with high dose   Family History  Problem Relation Age of Onset  . Uterine cancer Mother   . Hypertension Mother   . Stroke Mother   . Colon cancer Mother   . Colon polyps Mother   . Cancer Mother        colon, endometrial  . Diabetes Father   . Obesity Father   . Pneumonia Father   . Cancer Father        skin - squamous   . Diabetes Paternal Grandmother   . Prostate cancer Neg Hx     PE: BP (!) 154/86   Pulse 70   Ht _0  (1.778 m)   Wt 243 lb (110.2 kg)   SpO2 94%   BMI 34.87 kg/m  Wt Readings from Last 3 Encounters:  11/01/17 243 lb (110.2 kg)  08/02/17 247 lb 8 oz (112.3 kg)  07/12/17 245 lb (111.1 kg)   Constitutional: overweight, in NAD Eyes: PERRLA, EOMI, no exophthalmos ENT: moist mucous membranes, + thyroid lobectomy scar healed, no cervical lymphadenopathy Cardiovascular: RRR, No MRG Respiratory: CTA B Gastrointestinal: abdomen soft, NT, ND, BS+ Musculoskeletal: no deformities, strength intact in all 4 Skin: moist, warm, no rashes Neurological: no tremor with outstretched hands, DTR normal in all 4  ASSESSMENT: 1. DM2, non-insulin-dependent, uncontrolled, with complications - CKD stage 2  2. H/o Hurthle cell neoplasm  PLAN:  1. Patient with  long-standing, uncontrolled, diabetes, on oral antidiabetic regimen to which we added Trulicity at last visit, as his HbA1c was 9.2%.  At that time, he was only checking sugars in the morning and they were higher than target despite improvement after addition of Jardiance.  He takes a low-dose of Jardiance at night, to avoid increased urination during work hours.  At last visit we moved Amaryl before meals.  He refused a  nutrition referral.  We did discuss about diet at length at last visit and I suggested to stop juice and try to reduce fatty foods like meat and cheese. - Since last visit, he stopped juice, but he still having a lot of fatty foods, especially recently >> discussed to reduce these. - We will also increase his Trulicity to target dose of 1.5 mg weekly since he does not have any side effects from it - I suggested to:  Patient Instructions  Please continue: - Metformin 1000 mg 2x a day, with meals - Amaryl 4 mg in am and 2 mg in pm - Jardiance 10 mg daily at night  Please increase: - Trulicity to 1.5 mg weekly  Try to reduce meat and cheese - target: 2 meatless days a week.  Please return in 3 months with your sugar log.   - today, HbA1c is 9.3% (higher) - continue checking sugars at different times of the day - check 1x a day, rotating checks - advised for yearly eye exams >> he is UTD - Return to clinic in 3 mo with sugar log    2. 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

## 2017-11-02 ENCOUNTER — Encounter: Payer: Self-pay | Admitting: Internal Medicine

## 2017-11-04 ENCOUNTER — Ambulatory Visit: Payer: Self-pay | Admitting: General Surgery

## 2017-11-25 ENCOUNTER — Other Ambulatory Visit (INDEPENDENT_AMBULATORY_CARE_PROVIDER_SITE_OTHER): Payer: BC Managed Care – PPO

## 2017-11-25 DIAGNOSIS — N182 Chronic kidney disease, stage 2 (mild): Secondary | ICD-10-CM | POA: Diagnosis not present

## 2017-11-25 DIAGNOSIS — E1122 Type 2 diabetes mellitus with diabetic chronic kidney disease: Secondary | ICD-10-CM | POA: Diagnosis not present

## 2017-11-25 LAB — HEMOGLOBIN A1C: HEMOGLOBIN A1C: 9 % — AB (ref 4.6–6.5)

## 2017-11-29 ENCOUNTER — Other Ambulatory Visit: Payer: Self-pay | Admitting: Family Medicine

## 2017-12-05 ENCOUNTER — Encounter: Payer: Self-pay | Admitting: Family Medicine

## 2017-12-05 ENCOUNTER — Ambulatory Visit: Payer: BC Managed Care – PPO | Admitting: Family Medicine

## 2017-12-05 DIAGNOSIS — E1122 Type 2 diabetes mellitus with diabetic chronic kidney disease: Secondary | ICD-10-CM | POA: Diagnosis not present

## 2017-12-05 DIAGNOSIS — N182 Chronic kidney disease, stage 2 (mild): Secondary | ICD-10-CM

## 2017-12-05 NOTE — Progress Notes (Signed)
Diabetes:  Using medications without difficulties: yes Hypoglycemic episodes:no Hyperglycemic episodes:no Feet problems:no Blood Sugars averaging: usually <150 recently Sugar is clearly better in the meantime and A1c may lag his progress.  D/w pt.   He is going to get back on his exercise bike.    Meds, vitals, and allergies reviewed.   ROS: Per HPI unless specifically indicated in ROS section   GEN: nad, alert and oriented HEENT: mucous membranes moist NECK: supple w/o LA CV: rrr. PULM: ctab, no inc wob ABD: soft, +bs EXT: no edema SKIN: no acute rash  Diabetic foot exam: Normal inspection No skin breakdown No calluses  Normal DP pulses Normal sensation to light touch and monofilament Nails normal except for L 1st nail thick

## 2017-12-05 NOTE — Patient Instructions (Addendum)
Office visit in about 4 months, no labs ahead of time.  Take care.  Glad to see you.  Thanks for your effort.

## 2017-12-06 NOTE — Assessment & Plan Note (Signed)
Blood Sugars averaging: usually <150 recently Sugar is clearly better in the meantime and A1c may lag his progress.  D/w pt.   He is going to get back on his exercise bike.   Office visit in about 4 months, no labs ahead of time.  He agrees.

## 2017-12-12 ENCOUNTER — Other Ambulatory Visit: Payer: Self-pay | Admitting: Family Medicine

## 2017-12-12 NOTE — Telephone Encounter (Signed)
Copied from [place] [id number]. Topic: Quick Communication - Rx Refill/Question >> Dec 12, 2017  1:43 PM Oliver Pila B wrote: Medication: fluticasone (FLONASE) 50 MCG/ACT nasal spray [[id number]] , loperamide (IMODIUM A-D) 2 MG tablet [[id number], ONE TOUCH ULTRA TEST test strip [[id number]]  Has the patient contacted their pharmacy? Yes.   (Agent: If no, request that the patient contact the pharmacy for the refill.) Preferred Pharmacy (with phone number or street name): [facility] Agent: Please be advised that RX refills may take up to 3 business days. We ask that you follow-up with your pharmacy.

## 2017-12-13 MED ORDER — LOPERAMIDE HCL 2 MG PO TABS: 2.0000 mg | ORAL_TABLET | Freq: Two times a day (BID) | ORAL | 5 refills | Status: DC | PRN

## 2017-12-13 MED ORDER — GLUCOSE BLOOD VI STRP: ORAL_STRIP | 3 refills | Status: DC

## 2017-12-13 MED ORDER — FLUTICASONE PROPIONATE 50 MCG/ACT NA SUSP: 2.0000 | Freq: Every day | NASAL | 5 refills | Status: DC

## 2017-12-13 NOTE — Telephone Encounter (Signed)
Patient notified by telephone. 

## 2017-12-13 NOTE — Telephone Encounter (Signed)
Request refill for : Flonase 50 MCG/ACT  Last refilled on 11/01/15 and expired on 0328/18  Imodium A-D 2 MG tab , historical provider last refilled 09/18/12  One Touch Ultra Test strip 100 strips filled on 08/24/16 and expired on 08/24/17  Provider   Dr. Damita Dunnings LOV 12/05/17 NOV 04/08/18  Pharmacy  [id number] [address]

## 2017-12-13 NOTE — Telephone Encounter (Signed)
All 3 rxs sent.  Thanks.

## 2017-12-25 ENCOUNTER — Other Ambulatory Visit: Payer: Self-pay | Admitting: Family Medicine

## 2017-12-25 ENCOUNTER — Telehealth: Payer: Self-pay | Admitting: Family Medicine

## 2017-12-25 NOTE — Telephone Encounter (Signed)
Copied from [place] [id number]. Topic: Quick Communication - Rx Refill/Question >> Dec 25, 2017  9:23 AM Yvette Rack wrote: Medication: atorvastatin (LIPITOR) 20 MG tablet  Has the patient contacted their pharmacy? Yes.    Pt is out of this medicine  (Agent: If no, request that the patient contact the pharmacy for the refill.) (Agent: If yes, when and what did the pharmacy advise?)  Preferred Pharmacy (with phone number or street name): [id number] - [place], [place] - [place] [phone] (Phone) [phone] (Fax)      Agent: Please be advised that RX refills may take up to 3 business days. We ask that you follow-up with your pharmacy.

## 2018-01-04 ENCOUNTER — Other Ambulatory Visit: Payer: Self-pay | Admitting: Family Medicine

## 2018-01-15 ENCOUNTER — Encounter (HOSPITAL_COMMUNITY)
Admission: RE | Disposition: A | Discharge status: Home / Self Care | Payer: Self-pay | Source: Ambulatory Visit | Attending: General Surgery

## 2018-01-15 ENCOUNTER — Encounter (HOSPITAL_COMMUNITY): Payer: Self-pay | Admitting: *Deleted

## 2018-01-15 ENCOUNTER — Other Ambulatory Visit: Payer: Self-pay

## 2018-01-15 ENCOUNTER — Ambulatory Visit (HOSPITAL_COMMUNITY)
Admission: RE | Admit: 2018-01-15 | Discharge: 2018-01-15 | Disposition: A | Discharge status: Home / Self Care | Payer: BC Managed Care – PPO | Source: Ambulatory Visit | Attending: General Surgery | Admitting: General Surgery

## 2018-01-15 DIAGNOSIS — Z85038 Personal history of other malignant neoplasm of large intestine: Secondary | ICD-10-CM | POA: Insufficient documentation

## 2018-01-15 DIAGNOSIS — E119 Type 2 diabetes mellitus without complications: Secondary | ICD-10-CM | POA: Diagnosis not present

## 2018-01-15 DIAGNOSIS — Z800 Family history of malignant neoplasm of digestive organs: Secondary | ICD-10-CM | POA: Insufficient documentation

## 2018-01-15 DIAGNOSIS — Z7984 Long term (current) use of oral hypoglycemic drugs: Secondary | ICD-10-CM | POA: Insufficient documentation

## 2018-01-15 DIAGNOSIS — Z79890 Hormone replacement therapy (postmenopausal): Secondary | ICD-10-CM | POA: Diagnosis not present

## 2018-01-15 DIAGNOSIS — E039 Hypothyroidism, unspecified: Secondary | ICD-10-CM | POA: Insufficient documentation

## 2018-01-15 DIAGNOSIS — Z86010 Personal history of colonic polyps: Secondary | ICD-10-CM | POA: Diagnosis present

## 2018-01-15 DIAGNOSIS — Z8371 Family history of colonic polyps: Secondary | ICD-10-CM | POA: Insufficient documentation

## 2018-01-15 DIAGNOSIS — Z87891 Personal history of nicotine dependence: Secondary | ICD-10-CM | POA: Insufficient documentation

## 2018-01-15 DIAGNOSIS — Z886 Allergy status to analgesic agent status: Secondary | ICD-10-CM | POA: Insufficient documentation

## 2018-01-15 DIAGNOSIS — Z79899 Other long term (current) drug therapy: Secondary | ICD-10-CM | POA: Insufficient documentation

## 2018-01-15 DIAGNOSIS — D126 Benign neoplasm of colon, unspecified: Secondary | ICD-10-CM | POA: Insufficient documentation

## 2018-01-15 DIAGNOSIS — Z7982 Long term (current) use of aspirin: Secondary | ICD-10-CM | POA: Diagnosis not present

## 2018-01-15 DIAGNOSIS — E785 Hyperlipidemia, unspecified: Secondary | ICD-10-CM | POA: Insufficient documentation

## 2018-01-15 DIAGNOSIS — Z9049 Acquired absence of other specified parts of digestive tract: Secondary | ICD-10-CM | POA: Diagnosis not present

## 2018-01-15 HISTORY — PX: BIOPSY: SHX5522

## 2018-01-15 HISTORY — PX: POUCHOSCOPY: SHX6321

## 2018-01-15 LAB — GLUCOSE, CAPILLARY: Glucose-Capillary: 112 mg/dL — ABNORMAL HIGH (ref 65–99)

## 2018-01-15 SURGERY — ENDOSCOPY, POUCH, SMALL INTESTINE, DIAGNOSTIC

## 2018-01-15 NOTE — Op Note (Signed)
[facility] Patient Name: Cory Harper Procedure Date: 01/15/2018 MRN: [id number] Attending MD: Leighton Ruff , MD Date of Birth: September 12, 1955 CSN: [id number] Age: 63 Admit Type: Outpatient Procedure:                Pouchoscopy Indications:              History of total proctocolectomy, History of                            familial polyposis Providers:                Leighton Ruff, MD, Cleda Daub, RN, Laurena Spies, Technician Referring MD:              Medicines:                None Complications:            No immediate complications. Estimated blood loss:                            Minimal. Estimated Blood Loss:     Estimated blood loss was minimal. Procedure:                Pre-Anesthesia Assessment:                           - Prior to the procedure, a History and Physical                            was performed, and patient medications and                            allergies were reviewed. The patient's tolerance of                            previous anesthesia was also reviewed. The risks                            and benefits of the procedure and the sedation                            options and risks were discussed with the patient.                            All questions were answered, and informed consent                            was obtained. Prior Anticoagulants: The patient has                            taken aspirin. ASA Grade Assessment: II - A patient                            with mild systemic disease. After reviewing the  risks and benefits, the patient was deemed in                            satisfactory condition to undergo the procedure.                           After obtaining informed consent, the endoscope was                            passed under direct vision. Throughout the                            procedure, the patient's blood pressure, pulse, and                             oxygen saturations were monitored continuously. The                            [id number] ([id number]) scope was introduced through                            the ileoanal anastomosis via the anus and advanced                            to the ileoanal pouch and into the neo-terminal                            ileum. The procedure was performed with ease. The                            patient tolerated the procedure well. Scope In: 11:11:47 AM Scope Out: 11:19:22 AM Total Procedure Duration: 0 hours 7 minutes 35 seconds  Findings:      Patient is status-post total colectomy with an ileal pouch-anal       anastomosis.      The perianal and digital rectal examinations were normal.      The ileoanal pouch contained a few sessile, non-bleeding polyps. The       polyps were 2 mm in diameter. The polyp was removed with a cold snare.       Resection and retrieval were complete. Impression:               - No specimens collected. Recommendation:           - Await pathology results. Procedure Code(s):        --- Professional ---                           [phone], Endoscopic evaluation of small intestinal                            pouch (eg, Kock pouch, ileal reservoir [S or J]);                            diagnostic, including collection of specimen(s) by  brushing or washing, when performed (separate                            procedure)                           [id number], Unlisted procedure, small intestine Diagnosis Code(s):        --- Professional ---                           Z90.49, Acquired absence of other specified parts                            of digestive tract                           Z86.010, Personal history of colonic polyps CPT copyright 2017 American Medical Association. All rights reserved. The codes documented in this report are preliminary and upon coder review may  be revised to meet current compliance requirements. Leighton Ruff,  MD Leighton Ruff, MD 02/09/8674 11:31:15 AM This report has been signed electronically. Number of Addenda: 0

## 2018-01-15 NOTE — Discharge Instructions (Signed)
Flexible Sigmoidoscopy, Care After  This sheet gives you information about how to care for yourself after your procedure. Your health care provider may also give you more specific instructions. If you have problems or questions, contact your health care provider.  What can I expect after the procedure?  After the procedure, it is common to have:  · Abdominal cramping or pain.  · Bloating.  · A small amount of rectal bleeding if you had a biopsy.    Follow these instructions at home:  · Take over-the-counter and prescription medicines only as told by your health care provider.  · Do not drive for 24 hours if you received a medicine to help you relax (sedative).  · Keep all follow-up visits as told by your health care provider. This is important.  Contact a health care provider if:  · You have abdominal pain or cramping that gets worse or is not helped with medicine.  · You continue to have small amounts of rectal bleeding after 24 hours.  · You have nausea or vomiting.  · You feel weak or dizzy.  · You have a fever.  Get help right away if:  · You pass large blood clots or see a large amount of blood in the toilet after having a bowel movement.  · You have nausea or vomiting for more than 24 hours after the procedure.  This information is not intended to replace advice given to you by your health care provider. Make sure you discuss any questions you have with your health care provider.  Document Released: 07/28/2013 Document Revised: 02/10/2016 Document Reviewed: 10/22/2015  Elsevier Interactive Patient Education © 2018 [employer]

## 2018-01-15 NOTE — H&P (Signed)
HPI: here for yearly pouchoscopy.  No changes      Past Medical History:  Diagnosis Date  . Anemia   . Arthritis   . Cancer ([place])    stg II colon cancer s/p resection  . Colonic mass   . Diabetes mellitus   . FAP (familial adenomatous polyposis)   . Heart murmur   . Hyperlipidemia   . Hypertension    not on blood pressure meds since 11/13   . Hypothyroidism   . Nasal congestion          Past Surgical History:  Procedure Laterality Date  . BOWEL RESECTION  05/20/2012  . COLON RESECTION  05/20/2012   Procedure: COLON RESECTION LAPAROSCOPIC;  Surgeon: Adin Hector, MD;  [facility]: [facility];  Service: General;  Laterality: N/A;  Laparoscopic Proctocolectomy, Ileal Pouch Anal Anastomoisis, Loop Ileostomy  . ESOPHAGOGASTRODUODENOSCOPY (EGD) WITH PROPOFOL N/A 04/23/2013   Procedure: ESOPHAGOGASTRODUODENOSCOPY (EGD) WITH PROPOFOL;  Surgeon: Milus Banister, MD;  [facility];  Service: Endoscopy;  Laterality: N/A;  side viewing scope  . FLEXIBLE SIGMOIDOSCOPY  08/01/2012   Procedure: FLEXIBLE SIGMOIDOSCOPY;  Surgeon: Leighton Ruff, MD;  [facility];  Service: Endoscopy;  Laterality: N/A;  use endoscope  . FLEXIBLE SIGMOIDOSCOPY N/A 11/29/2014   Procedure: FLEXIBLE SIGMOIDOSCOPY/POUCHOSCOPY;  Surgeon: Leighton Ruff, MD;  [facility];  Service: Endoscopy;  Laterality: N/A;  . ILEOSTOMY  05/20/12  . ILEOSTOMY CLOSURE N/A 09/18/2012   Procedure: Loop Ileostomy Takedown with EUA ;  Surgeon: Adin Hector, MD;  [facility];  Service: General;  Laterality: N/A;  loop ileostomy takedown with EUA   . KNEE ARTHROSCOPY  1998   Right knee  . POUCHOSCOPY  11/16/2013   Procedure: POUCHOSCOPY;  Surgeon: Leighton Ruff, MD;  Location: [facility];  Service: Endoscopy;;  . POUCHOSCOPY N/A 11/16/2015   Procedure: POUCHOSCOPY;  Surgeon: Leighton Ruff, MD;  [facility];  Service: Endoscopy;  Laterality: N/A;  . THYROID  LOBECTOMY  05/20/2012   Procedure: THYROID LOBECTOMY;  Surgeon: Adin Hector, MD;  [facility];  Service: General;  Laterality: Left;  LEFT THYROID LOBECTOMY  . UPPER GASTROINTESTINAL ENDOSCOPY    . WISDOM TOOTH EXTRACTION           Family History  Problem Relation Age of Onset  . Uterine cancer Mother   . Hypertension Mother   . Stroke Mother   . Colon cancer Mother   . Colon polyps Mother   . Cancer Mother        colon, endometrial  . Diabetes Father   . Obesity Father   . Pneumonia Father   . Cancer Father        skin - squamous   . Diabetes Paternal Grandmother   . Prostate cancer Neg Hx    Social History:  reports that he quit smoking about 25 years ago. He has never used smokeless tobacco. He reports that he does not drink alcohol or use drugs.  Allergies:       Allergies  Allergen Reactions  . Ibuprofen Swelling    Made his joints swell with high dose          Medications Prior to Admission  Medication Sig Dispense Refill  . aspirin 81 MG tablet Take 81 mg by mouth daily.    Marland Kitchen atorvastatin (LIPITOR) 20 MG tablet take 1 tablet by mouth daily 90 tablet 0  . cholecalciferol (VITAMIN D) 1000 UNITS tablet Take 1,000 Units by mouth daily.     Marland Kitchen  ferrous sulfate 325 (65 FE) MG tablet Take 325 mg by mouth 2 (two) times daily.  0  . fluticasone (FLONASE) 50 MCG/ACT nasal spray Place 2 sprays into both nostrils daily. 16 g 5  . glimepiride (AMARYL) 2 MG tablet take 1 tablet by mouth every morning IF BLOOD SUAGR IS LESS 90 tablet 3  . JANUVIA 100 MG tablet take 1 tablet by mouth daily 90 tablet 3  . levothyroxine (SYNTHROID, LEVOTHROID) 100 MCG tablet take 1 tablet by mouth every morning 30 tablet 5  . loperamide (IMODIUM A-D) 2 MG tablet Take 2-4 mg by mouth 2 (two) times daily as needed for diarrhea or loose stools (Goal is 4-6 BMs / day). For diarrhea    . loratadine (CLARITIN) 10 MG tablet Take 10 mg by mouth daily.     .  metFORMIN (GLUCOPHAGE) 1000 MG tablet take 1 tablet by mouth twice a day with food 60 tablet 5  . ONE TOUCH ULTRA TEST test strip CHECK SUGAR daily as directed by prescriber 100 each 3  . RA ANTI-DIARRHEAL 2 MG capsule Take 1 capsule by mouth 2 (two) times daily.  0  . vitamin B-12 (CYANOCOBALAMIN) 500 MCG tablet Take 500 mcg by mouth daily.    . Blood Glucose Monitoring Suppl (ONE TOUCH ULTRA SYSTEM KIT) W/DEVICE KIT Dispense 1 kit with 100 lancets and strips, with 3 rf on lancets and strips.  Check sugar daily.  Dx E11.9 1 each 0    LabResultsLast48Hours  No results found for this or any previous visit (from the past 48 hour(s)).   ImagingResults(Last48hours)  No results found.    Review of Systems  Constitutional: Negative for chills and fever.  Eyes: Negative for blurred vision and double vision.  Respiratory: Negative for cough and shortness of breath.   Cardiovascular: Negative for chest pain and palpitations.  Gastrointestinal: Negative for abdominal pain, nausea and vomiting.  Genitourinary: Negative for dysuria, frequency and urgency.  Skin: Negative for itching and rash.  Neurological: Negative for dizziness.    BP 137/79   Pulse 69   Temp 97.7 F (36.5 C) (Oral)   Resp 14   Ht 5' 10"  (1.778 m)   Wt 112.4 kg (247 lb 12.8 oz)   SpO2 100%   BMI 35.56 kg/m   Physical Exam  Constitutional: He is oriented to person, place, and time. He appears well-developed and well-nourished.  HENT:  Head: Normocephalic and atraumatic.  Eyes: Conjunctivae and EOM are normal. Pupils are equal, round, and reactive to light.  Neck: Normal range of motion. Neck supple.  Cardiovascular: Normal rate and regular rhythm.   Respiratory: Effort normal and breath sounds normal. No respiratory distress.  GI: Soft. Bowel sounds are normal. He exhibits no distension. There is no tenderness.  Musculoskeletal: Normal range of motion.  Neurological: He is alert and oriented to  person, place, and time.  Skin: Skin is warm and dry.     Assessment/Plan Pt with FAP.  He requires surveillance for polyps within his pouch. This will be done with min to no sedation.

## 2018-01-17 ENCOUNTER — Encounter (HOSPITAL_COMMUNITY): Payer: Self-pay | Admitting: General Surgery

## 2018-02-03 ENCOUNTER — Encounter: Payer: Self-pay | Admitting: Internal Medicine

## 2018-02-03 ENCOUNTER — Telehealth: Payer: Self-pay | Admitting: Family Medicine

## 2018-02-03 ENCOUNTER — Ambulatory Visit (INDEPENDENT_AMBULATORY_CARE_PROVIDER_SITE_OTHER): Payer: BC Managed Care – PPO | Admitting: Internal Medicine

## 2018-02-03 VITALS — BP 122/70 | HR 83 | Ht 70.0 in | Wt 245.6 lb

## 2018-02-03 DIAGNOSIS — E1122 Type 2 diabetes mellitus with diabetic chronic kidney disease: Secondary | ICD-10-CM | POA: Diagnosis not present

## 2018-02-03 DIAGNOSIS — E890 Postprocedural hypothyroidism: Secondary | ICD-10-CM | POA: Diagnosis not present

## 2018-02-03 DIAGNOSIS — D34 Benign neoplasm of thyroid gland: Secondary | ICD-10-CM

## 2018-02-03 DIAGNOSIS — N182 Chronic kidney disease, stage 2 (mild): Secondary | ICD-10-CM | POA: Diagnosis not present

## 2018-02-03 LAB — POCT GLYCOSYLATED HEMOGLOBIN (HGB A1C): HEMOGLOBIN A1C: 8.6 % — AB (ref 4.0–5.6)

## 2018-02-03 NOTE — Telephone Encounter (Signed)
Pt dropped off DMV forms to be filled out. Placed in [place] tower.

## 2018-02-03 NOTE — Patient Instructions (Signed)
Please continue: - Glimepiride 4 mg in am and 2 mg in pm - Jardiance 10 mg daily at night - Trulicity 1.5 mg weekly  Please move all the Metformin (2000 mg) with dinner.  Please return in 3-4 months with your sugar log.

## 2018-02-03 NOTE — Progress Notes (Addendum)
Patient ID: Cory Harper, male   DOB: 03-29-1956, 62 y.o.   MRN: [id number]   HPI: Cory Harper is a 62 y.o.-year-old male, returning for follow-up for DM2, dx in 1998, non-insulin-dependent, uncontrolled, with long term complications (+ mild CKD). Last visit 3 mo ago.  Last hemoglobin A1c was: Lab Results  Component Value Date   HGBA1C 9.0 (H) 11/25/2017   HGBA1C 9.3 11/01/2017   HGBA1C 9.0 (H) 07/25/2017   Pt is on a regimen of: - Metformin 1000 mg 2x a day, with meals - Amaryl 4 mg in am and 2 mg in pm - Jardiance 10 mg daily at night to avoid daytime urinary frequency - started 03/2017 - Januvia 100 mg daily in am >> Trulicity 1.5 mg weekly - started  07/2017 Tried Actos >> signif. Weight gain On Cinnamon.  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  Pt checks his sugars 1X a day: - am: 150-160s, lately 150-200 >> 161-180 >> 78, 122-175 - 2h after b'fast: n/c - before lunch: n/c >> 118-180 >> 107-155, 164 - 2h after lunch: n/c >> 171, 178 >> n/c - before dinner: n/c >> 132-178 >> 123 - 2h after dinner: n/c >> 163, 180 >> 130 - bedtime: n/c >> 160-180 >> 150-170 - nighttime: n/c >> 180 Lowest sugar was 125 >> 118 >> 107; it is unclear at which level he has hypoglycemia awareness. Highest sugar was 208 >> 180 >> 175.  Glucometer: One Touch Ultra mini  Pt's meals are: - Breakfast: bowl of grits, [place] bacon before starts work,  - Lunch: [place] or ham+cheese wrap or tuna salad - Dinner: baked chicken or pork chop, green beans - Snacks: crackers He exercises by riding his bike 15 to 30 minutes per night.  Works: 5:45-9 am, 2 -5 pm, with few exceptions.   - + mild CKD, last BUN/creatinine:  Lab Results  Component Value Date   BUN 21 07/25/2017   BUN 17 03/12/2017   CREATININE 1.24 07/25/2017   CREATININE 1.27 03/12/2017   Lab Results  Component Value Date   GFRNONAA 68 (L) 09/22/2012   + HL; last set of lipids: Lab Results  Component Value  Date   CHOL 105 07/25/2017   HDL 36.70 (L) 07/25/2017   LDLCALC 35 07/25/2017   LDLDIRECT 49.4 04/27/2013   TRIG 164.0 (H) 07/25/2017   CHOLHDL 3 07/25/2017  On Lipitor 20. On ASA 81. - last eye exam was in 07/2015: No DR - no numbness and tingling in his feet.  He had foot exam by PCP on 12/05/2017.  Pt has no FH of DM.  He has familial polyposis and has a history of colon cancer >> has a J-pouch.   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  He has postsurgical hypothyroidism and is on levothyroxine 100 mcg daily: - in am - fasting - at least 30 min from b'fast - no Ca, Fe, MVI, PPIs - not on Biotin  ROS: Constitutional: no weight gain/no weight loss, no fatigue, no subjective hyperthermia, no subjective hypothermia Eyes: no blurry vision, no xerophthalmia ENT: no sore throat, no nodules palpated in throat, no dysphagia, no odynophagia, no hoarseness Cardiovascular: no CP/no SOB/no palpitations/no leg swelling Respiratory: no cough/no SOB/no wheezing Gastrointestinal: no N/no V/no D/no C/no acid reflux Musculoskeletal: no muscle aches/no joint aches Skin: no rashes, no hair loss Neurological: no tremors/no numbness/no tingling/no  dizziness  I reviewed pt's medications, allergies, PMH, social hx, family hx, and changes were documented in the history of present illness. Otherwise, unchanged from my initial visit note.  Past Medical History:  Diagnosis Date  . Anemia   . Arthritis   . Cancer ([place])    stg II colon cancer s/p resection  . Colonic mass   . Diabetes mellitus   . FAP (familial adenomatous polyposis)   . Heart murmur   . Hyperlipidemia   . Hypertension    not on blood pressure meds since 11/13   . Hypothyroidism   . Nasal congestion    Past Surgical History:  Procedure Laterality Date  . BIOPSY  01/15/2018   Procedure: BIOPSY;  Surgeon: Leighton Ruff, MD;  [facility];   Service: Endoscopy;;  . BOWEL RESECTION  05/20/2012  . COLON RESECTION  05/20/2012   Procedure: COLON RESECTION LAPAROSCOPIC;  Surgeon: Adin Hector, MD;  [facility];  Service: General;  Laterality: N/A;  Laparoscopic Proctocolectomy, Ileal Pouch Anal Anastomoisis, Loop Ileostomy  . ESOPHAGOGASTRODUODENOSCOPY (EGD) WITH PROPOFOL N/A 04/23/2013   Procedure: ESOPHAGOGASTRODUODENOSCOPY (EGD) WITH PROPOFOL;  Surgeon: Milus Banister, MD;  [facility];  Service: Endoscopy;  Laterality: N/A;  side viewing scope  . FLEXIBLE SIGMOIDOSCOPY  08/01/2012   Procedure: FLEXIBLE SIGMOIDOSCOPY;  Surgeon: Leighton Ruff, MD;  [facility];  Service: Endoscopy;  Laterality: N/A;  use endoscope  . FLEXIBLE SIGMOIDOSCOPY N/A 11/29/2014   Procedure: FLEXIBLE SIGMOIDOSCOPY/POUCHOSCOPY;  Surgeon: Leighton Ruff, MD;  [facility];  Service: Endoscopy;  Laterality: N/A;  . ILEOSTOMY  05/20/12  . ILEOSTOMY CLOSURE N/A 09/18/2012   Procedure: Loop Ileostomy Takedown with EUA ;  Surgeon: Adin Hector, MD;  [facility];  Service: General;  Laterality: N/A;  loop ileostomy takedown with EUA   . KNEE ARTHROSCOPY  1998   Right knee  . POUCHOSCOPY  11/16/2013   Procedure: POUCHOSCOPY;  Surgeon: Leighton Ruff, MD;  Location: [facility];  Service: Endoscopy;;  . POUCHOSCOPY N/A 11/16/2015   Procedure: POUCHOSCOPY;  Surgeon: Leighton Ruff, MD;  [facility];  Service: Endoscopy;  Laterality: N/A;  . POUCHOSCOPY N/A 12/20/2016   Procedure: POUCHOSCOPY;  Surgeon: Leighton Ruff, MD;  [facility];  Service: Endoscopy;  Laterality: N/A;  . POUCHOSCOPY N/A 01/15/2018   Procedure: POUCHOSCOPY;  Surgeon: Leighton Ruff, MD;  [facility];  Service: Endoscopy;  Laterality: N/A;  . THYROID LOBECTOMY  05/20/2012   Procedure: THYROID LOBECTOMY;  Surgeon: Adin Hector, MD;  [facility];  Service: General;  Laterality: Left;  LEFT THYROID LOBECTOMY  . UPPER  GASTROINTESTINAL ENDOSCOPY    . WISDOM TOOTH EXTRACTION     Social History   Socioeconomic History  . Marital status: Married    Spouse name: June  . Number of children: 0  Social Needs  Occupational History  . 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 Smoking status: Former Smoker    Last attempt to quit: 08/07/1991    Years since quitting: 25.9  . Smokeless tobacco: Never Used  . Tobacco comment: quit in 1993  Substance and Sexual Activity  . Alcohol use: No  . Drug use: No  . Sexual activity: Not on file  Other Topics Concern  . 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 aspirin 81 MG tablet Take 81 mg by mouth daily.    Marland Kitchen atorvastatin (LIPITOR) 20 MG tablet TAKE 1 TABLET BY MOUTH ONCE DAILY 90 tablet 1  . Blood Glucose Monitoring Suppl (ONE TOUCH ULTRA SYSTEM KIT) W/DEVICE KIT Dispense 1 kit with 100 lancets and strips, with 3 rf on lancets and strips.  Check sugar daily.  Dx E11.9 1 each 0  . cholecalciferol (VITAMIN D) 1000 UNITS tablet Take 1,000 Units by mouth daily.     . Dulaglutide (TRULICITY) 1.5 PQ/3.3AQ SOPN Inject 1.5 mg weekly 4 pen 11  . ferrous sulfate 325 (65 FE) MG tablet Take 325 mg by mouth 2 (two) times daily.  0  . fluticasone (FLONASE) 50 MCG/ACT nasal spray Place 2 sprays into both nostrils daily. (Patient taking differently: Place 2 sprays into both nostrils daily as needed for allergies. ) 16 g 5  . glimepiride (AMARYL) 2 MG tablet 2 tabs in the AM and 1 tab in the PM 270 tablet 3  . glucose blood (ONE TOUCH ULTRA TEST) test strip Use to check sugar daily, h/o DM2. 100 each 3  . JARDIANCE 10 MG TABS tablet TAKE 1 TABLET BY MOUTH ONCE DAILY 30 tablet 3   . levothyroxine (SYNTHROID, LEVOTHROID) 100 MCG tablet TAKE 1 TABLET BY MOUTH EVERY MORNING 30 tablet 7  . loperamide (IMODIUM A-D) 2 MG tablet Take 1-2 tablets (2-4 mg total) by mouth 2 (two) times daily as needed for diarrhea or loose stools. For diarrhea 30 tablet 5  . loratadine (CLARITIN) 10 MG tablet Take 10 mg by mouth daily.     . metFORMIN (GLUCOPHAGE) 1000 MG tablet TAKE 1 TABLET BY MOUTH TWICE A DAY WITH FOOD 60 tablet 5  . vitamin B-12 (CYANOCOBALAMIN) 500 MCG tablet Take 500 mcg by mouth daily.     No current facility-administered medications on file prior to visit.    Allergies  Allergen Reactions  . Ibuprofen Swelling    Made his joints swell with high dose   Family History  Problem Relation Age of Onset  . Uterine cancer Mother   . Hypertension Mother   . Stroke Mother   . Colon cancer Mother   . Colon polyps Mother   . Cancer Mother        colon, endometrial  . Diabetes Father   . Obesity Father   . Pneumonia Father   . Cancer Father        skin - squamous   . Diabetes Paternal Grandmother   . Prostate cancer Neg Hx     PE: BP 122/70   Pulse 83   Ht _0  (1.778 m)   Wt 245 lb 9.6 oz (111.4 kg)   SpO2 98%   BMI 35.24 kg/m  Wt Readings from Last 3 Encounters:  02/03/18 245 lb 9.6 oz (111.4 kg)  01/15/18 247 lb 12.8 oz (112.4 kg)  12/05/17 247 lb 12 oz (112.4 kg)   Constitutional: overweight, in NAD Eyes: PERRLA, EOMI, no exophthalmos ENT: moist mucous membranes, thyroidectomy scar healed, no masses palpated in lower neck, no cervical lymphadenopathy Cardiovascular: RRR, No MRG Respiratory: CTA B Gastrointestinal: abdomen soft, NT, ND, BS+ Musculoskeletal: no deformities, strength intact in all 4 Skin: moist, warm, no rashes Neurological: no tremor with outstretched hands, DTR normal  in all 4  ASSESSMENT: 1. DM2, non-insulin-dependent, uncontrolled, with complications - CKD stage 2  2. H/o Hurthle cell neoplasm  3.  Postsurgical  hypothyroidism  PLAN:  1. Patient with long-standing, uncontrolled, type 2 diabetes, on oral antidiabetic regimen and also GLP-1 receptor agonist, which we increased at last visit.  No nausea/vomiting/constipation from this. He continues a low dose of Jardiance at night, to avoid increased urination during work hours.  At last visit, we discussed about improving diet and reduce fatty foods like meat and cheese to reduce his insulin resistance.  He refused a nutrition referral before. - At this visit, sugars have improved especially after school was out mid-June - As sugars in the morning are higher than target, we discussed about moving the entire metformin dose at night.  I advised him that if he does not tolerate this well, to let me know so I can send the prescription for metformin ER to his pharmacy - I suggested to:  Patient Instructions  Please continue: - Glimepiride 4 mg in am and 2 mg in pm - Jardiance 10 mg daily at night - Trulicity 1.5 mg weekly  Please move all the Metformin (2000 mg) with dinner.  Please return in 3-4 months with your sugar log.   - today, HbA1c is 8.6% (better), but if he continues with improved CBGs, next HbA1c would be better - continue checking sugars at different times of the day - check 1x a day, rotating checks - advised for yearly eye exams >> he is not UTD - Return to clinic in 3 mo with sugar log    2. H/o Hurthle cell neoplasm - Reviewed thyroid ultrasound report from 2013 and also pathology of the left thyroidectomy specimen from 2013 - he did have an enlarged lymph node on the ultrasound from 2013 we will reevaluate-  - We will Also need a repeat ultrasound of the right lobe >> will order this today.  If no nodules, no further evaluation is indicated.   3.  Postsurgical hypothyroidism - latest thyroid labs reviewed with pt >> normal in 07/2017 - he continues on LT4 100 mcg daily - pt feels good on this dose. - we discussed about taking the  thyroid hormone every day, with water, >30 minutes before breakfast, separated by >4 hours from acid reflux medications, calcium, iron, multivitamins. Pt. is taking it correctly.  Orders Placed This Encounter  Procedures  . US THYROID    Standing Status:   Future    Number of Occurrences:   1    Standing Expiration Date:   04/07/2019    Order Specific Question:   Reason for Exam (SYMPTOM  OR DIAGNOSIS REQUIRED)    Answer:   h/o Hurthle cell neoplasm, s/p L thyroidectomy; please evaluate right lobe    Order Specific Question:   Preferred imaging location?    Answer:   [facility]   Narrative    CLINICAL DATA: Prior ultrasound follow-up. 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  EXAM: THYROID ULTRASOUND  TECHNIQUE: Ultrasound examination of the thyroid gland and adjacent soft tissues was performed.  COMPARISON: 04/04/2012; left-sided thyroid nodule fine-needle aspiration-04/10/2012  FINDINGS: Parenchymal Echotexture: Normal  Isthmus: Normal in size measuring 0.3 cm in diameter  Right lobe: Normal in size measuring 4.2 x 1.7 x 1.3 cm, unchanged, previously, 4.1 x 1.8 x 1.5 cm  Left lobe: Surgically absent. There is no residual nodular soft tissue within the left lobectomy resection bed.  _________________________________________________________  Estimated total number of nodules >/= 1 cm: 0  Number of spongiform nodules >/= 2 cm not described below (TR1): 0  Number of mixed cystic and solid nodules >/= 1.5 cm not described below ([place]): 0  _________________________________________________________  No discrete nodules are seen within the thyroid gland.  IMPRESSION: 1. Post left thyroid lobectomy without evidence of locally residual or locally recurrent disease. 2. Normal appearance of the remaining thyroid parenchyma.   Electronically Signed By: Sandi Mariscal M.D. On: 02/17/2018 14:02   Good news: The right thyroid does not  contain any  Nodules.  Philemon Kingdom, MD PhD [facility] Endocrinology

## 2018-02-03 NOTE — Telephone Encounter (Signed)
Form is on your desk.

## 2018-02-03 NOTE — Addendum Note (Signed)
Addended by: Drucilla Schmidt on: 02/03/2018 09:01 AM   Modules accepted: Orders

## 2018-02-04 NOTE — Telephone Encounter (Signed)
I'll check the hard copy. Thanks.  

## 2018-02-10 NOTE — Telephone Encounter (Signed)
Patient checking status, requesting to pick up on Wednesday morning. Please advise

## 2018-02-11 NOTE — Telephone Encounter (Signed)
Im working on it  

## 2018-02-11 NOTE — Telephone Encounter (Signed)
Hard copy completed by Dr. Damita Dunnings, copies made for scanning and patient advised original is left at front desk for pickup.

## 2018-02-12 ENCOUNTER — Encounter: Payer: Self-pay | Admitting: Family Medicine

## 2018-02-12 LAB — HM DIABETES EYE EXAM

## 2018-02-17 ENCOUNTER — Ambulatory Visit
Admission: RE | Admit: 2018-02-17 | Discharge: 2018-02-17 | Disposition: A | Discharge status: Home / Self Care | Payer: BC Managed Care – PPO | Source: Ambulatory Visit | Attending: Internal Medicine | Admitting: Internal Medicine

## 2018-02-28 ENCOUNTER — Encounter: Payer: Self-pay | Admitting: Family Medicine

## 2018-03-07 ENCOUNTER — Encounter: Payer: Self-pay | Admitting: Family Medicine

## 2018-03-07 ENCOUNTER — Telehealth: Payer: Self-pay | Admitting: *Deleted

## 2018-03-07 NOTE — Telephone Encounter (Signed)
Copied from [place] [id number]. Topic: General - Other >> Mar 07, 2018  1:48 PM Yvette Rack wrote: Reason for CRM: pt calling stating that the cologuard test that was in his Mychart doesn't belong to him and that he want it removed immediatly he states that he doesn't have a colon to even do the test he is upset that he saw this in his mychart  pt would like a call back asap  [phone]

## 2018-03-07 NOTE — Telephone Encounter (Signed)
Noted. Thanks.

## 2018-03-07 NOTE — Telephone Encounter (Signed)
Please call pt.   I don't recall ordering this.   He shouldn't have had it done.  It needs to be removed from his chart and corrected.   Please talk to me about this.  Thanks.

## 2018-03-07 NOTE — Progress Notes (Signed)
Order(s) created erroneously. Erroneous order ID: [id number]  Order moved by: Kelly Splinter  Order move date/time: 03/07/2018 4:58 PM  Source Patient: [id number]  Source Contact: 02/28/2018  Destination Patient: [id number]  Destination Contact: 11/15/2016

## 2018-03-07 NOTE — Telephone Encounter (Signed)
Spoke with patient and assured that no charges were placed and no report was scanned into his chart.  There apparently was an entry made under quick abstractions that apparently was entered by mistake and a ticket has been placed to have that removed.

## 2018-04-08 ENCOUNTER — Ambulatory Visit: Payer: BC Managed Care – PPO | Admitting: Family Medicine

## 2018-04-29 ENCOUNTER — Other Ambulatory Visit: Payer: Self-pay | Admitting: Family Medicine

## 2018-05-01 ENCOUNTER — Encounter: Payer: Self-pay | Admitting: Family Medicine

## 2018-05-01 ENCOUNTER — Ambulatory Visit: Payer: BC Managed Care – PPO | Admitting: Family Medicine

## 2018-05-01 VITALS — BP 124/64 | HR 69 | Temp 98.3°F | Ht 70.0 in | Wt 246.0 lb

## 2018-05-01 DIAGNOSIS — N182 Chronic kidney disease, stage 2 (mild): Secondary | ICD-10-CM | POA: Diagnosis not present

## 2018-05-01 DIAGNOSIS — Z23 Encounter for immunization: Secondary | ICD-10-CM

## 2018-05-01 DIAGNOSIS — E1122 Type 2 diabetes mellitus with diabetic chronic kidney disease: Secondary | ICD-10-CM | POA: Diagnosis not present

## 2018-05-01 LAB — MICROALBUMIN / CREATININE URINE RATIO
Creatinine,U: 49 mg/dL
Microalb Creat Ratio: 1.4 mg/g (ref 0.0–30.0)
Microalb, Ur: <0.7 mg/dL (ref 0.0–1.9)

## 2018-05-01 MED ORDER — METFORMIN HCL 1000 MG PO TABS: 2000.0000 mg | ORAL_TABLET | Freq: Every day | ORAL | 0 refills | Status: DC

## 2018-05-01 NOTE — Patient Instructions (Addendum)
Go to the lab on the way out.  We'll contact you with your lab report. Recheck at a physical in early 2020 after you have seen Dr. Cruzita Lederer and Dr. Benay Spice.   Keep working on diet and exercise.  Update me as needed.

## 2018-05-01 NOTE — Progress Notes (Signed)
Diabetes:  Using medications without difficulties: yes Hypoglycemic episodes: no Hyperglycemic episodes:no Feet problems:no Blood Sugars averaging: 150s is usually maximum, rare 170s.   eye exam within last year: yes He is going to get back on his exercise bike.   A1c is coming down.  D/w pt.   He is still driving.  D/w pt.  No troubles with that.   Microalbumin is pending.  See notes on labs.  Meds, vitals, and allergies reviewed.   ROS: Per HPI unless specifically indicated in ROS section   GEN: nad, alert and oriented HEENT: mucous membranes moist NECK: supple w/o LA CV: rrr. PULM: ctab, no inc wob ABD: soft, +bs EXT: no edema SKIN: no acute rash but has varicose veins at baseline on the B ankles.   Diabetic foot exam: Normal inspection No skin breakdown No calluses  Normal DP pulses Normal sensation to light touch and monofilament Nails thick but still growing normally.

## 2018-05-02 NOTE — Assessment & Plan Note (Signed)
Blood Sugars averaging: 150s is usually maximum, rare 170s.   No hypoglycemia. eye exam within last year: yes He is going to get back on his exercise bike.   A1c is coming down.  D/w pt.   He is still driving.  D/w pt.  No troubles with that.   Microalbumin is pending.  See notes on labs.  Recheck here for physical after he is seen endocrinology and hematology.  I appreciate the help of all involved.

## 2018-05-04 ENCOUNTER — Other Ambulatory Visit: Payer: Self-pay | Admitting: Family Medicine

## 2018-05-22 ENCOUNTER — Ambulatory Visit (INDEPENDENT_AMBULATORY_CARE_PROVIDER_SITE_OTHER): Payer: BC Managed Care – PPO | Admitting: Internal Medicine

## 2018-05-22 ENCOUNTER — Encounter: Payer: Self-pay | Admitting: Internal Medicine

## 2018-05-22 VITALS — BP 140/70 | HR 90 | Ht 70.0 in | Wt 245.0 lb

## 2018-05-22 DIAGNOSIS — D34 Benign neoplasm of thyroid gland: Secondary | ICD-10-CM | POA: Diagnosis not present

## 2018-05-22 DIAGNOSIS — E1122 Type 2 diabetes mellitus with diabetic chronic kidney disease: Secondary | ICD-10-CM

## 2018-05-22 DIAGNOSIS — E890 Postprocedural hypothyroidism: Secondary | ICD-10-CM

## 2018-05-22 DIAGNOSIS — N182 Chronic kidney disease, stage 2 (mild): Secondary | ICD-10-CM

## 2018-05-22 LAB — T4, FREE: FREE T4: 1.01 ng/dL (ref 0.60–1.60)

## 2018-05-22 LAB — TSH: TSH: 3.01 u[IU]/mL (ref 0.35–4.50)

## 2018-05-22 LAB — POCT GLYCOSYLATED HEMOGLOBIN (HGB A1C): Hemoglobin A1C: 9.5 % — AB (ref 4.0–5.6)

## 2018-05-22 LAB — GLUCOSE, POCT (MANUAL RESULT ENTRY): POC Glucose: 134 mg/dl — AB (ref 70–99)

## 2018-05-22 NOTE — Patient Instructions (Signed)
Please continue: - Metformin 2000 mg with dinner - Glimepiride 4 mg in am and 2 mg in pm - Jardiance 10 mg daily at night - Trulicity 1.5 mg weekly  Please continue Levothyroxine 100 mcg daily.  Take the thyroid hormone every day, with water, at least 30 minutes before breakfast, separated by at least 4 hours from: - acid reflux medications - calcium - iron - multivitamins  Please return in 3 months with your sugar log.

## 2018-05-22 NOTE — Progress Notes (Signed)
Patient ID: Cory Harper, male   DOB: Jun 23, 1956, 62 y.o.   MRN: [id number]   HPI: Cory Harper is a 62 y.o.-year-old male, returning for follow-up for DM2, dx in 1998, non-insulin-dependent, uncontrolled, with long term complications (+ mild CKD). Last visit 3.5 mo ago.  He was working out on a stationary bike >> stopped during the summer >> restarted.  Reviewed latest HbA1c levels: Lab Results  Component Value Date   HGBA1C 8.6 (A) 02/03/2018   HGBA1C 9.0 (H) 11/25/2017   HGBA1C 9.3 11/01/2017   Pt is on a regimen of: - Metformin 1000 mg 2x a day, with meals - Amaryl 4 mg in am and 2 mg in pm - Jardiance 10 mg daily at night to avoid daytime urinary frequency -started 03/2017 - Januvia 100 mg daily in am >> Trulicity 1.5 mg weekly -started 07/2017.  No GI side effects Tried Actos >> signif. Weight gain On Cinnamon.  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  Pt checks his sugars 1-2 times a day: - am: 161-180 >> 78, 122-175 >> 143-160 - 2h after b'fast: n/c >> 147 - before lunch:  118-180 >> 107-155, 164 >> 130-154 - 2h after lunch: n/c >> 171, 178 >> n/c - before dinner: n/c >> 132-178 >> 123 >> n/c - 2h after dinner: n/c >> 163, 180 >> 130 >> n/c - bedtime: n/c >> 160-180 >> 150-170 >> 150-165 - nighttime: n/c >> 180 Lowest sugar was 107 >>  130; it is unclear at which level he has hypoglycemia awareness. Highest sugar was 175 >> 165.  Glucometer: One Touch Ultra mini  Pt's meals are: - Breakfast: bowl of grits, [place] bacon before starts work,  - Lunch: [place] or ham+cheese wrap or tuna salad - Dinner: baked chicken or pork chop, green beans - Snacks: crackers  Works: 5:45-9 am, 2 -5 pm, with few exceptions.  -+ Mild CKD, last BUN/creatinine:  Lab Results  Component Value Date   BUN 21 07/25/2017   BUN 17 03/12/2017   CREATININE 1.24 07/25/2017   CREATININE 1.27 03/12/2017   Lab Results  Component Value Date   GFRNONAA 68 (L) 09/22/2012    + HL; last set of lipids: Lab Results  Component Value Date   CHOL 105 07/25/2017   HDL 36.70 (L) 07/25/2017   LDLCALC 35 07/25/2017   LDLDIRECT 49.4 04/27/2013   TRIG 164.0 (H) 07/25/2017   CHOLHDL 3 07/25/2017  On Lipitor 20. On ASA 81. - last eye exam was in 02/2018: No DR -No numbness and tingling in his feet.  He had foot exam by PCP on 12/05/2017.  Pt has no FH of DM.  He has familial polyposis and has a history of colon cancer >> has a J-pouch.   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  We obtained a thyroid ultrasound in 02/2018: 1. Post left thyroid lobectomy without evidence of locally residual or locally recurrent disease. 2. Normal appearance of the remaining thyroid parenchyma.  Pt denies: - feeling nodules in neck - hoarseness - dysphagia - choking - SOB with lying down  Postsurgical hypothyroidism:  Pt is on levothyroxine 100 mcg daily, taken: - in am - fasting - at least 30 min from b'fast - no Ca, Fe, MVI, PPIs - not on Biotin  Latest TSH levels were reviewed and they were normal: Lab Results  Component  Value Date   TSH 3.46 07/25/2017   TSH 2.29 06/27/2016   TSH 1.65 06/14/2015   TSH 1.63 03/11/2014   TSH 2.22 01/19/2013   TSH 2.49 08/19/2012   TSH 5.577 (H) 06/23/2012   TSH 2.86 04/09/2012   TSH 3.01 09/22/2010   TSH 2.62 09/13/2009    ROS: Constitutional: no weight gain/no weight loss, no fatigue, no subjective hyperthermia, no subjective hypothermia Eyes: no blurry vision, no xerophthalmia ENT: no sore throat, + see HPI Cardiovascular: no CP/no SOB/no palpitations/no leg swelling Respiratory: no cough/no SOB/no wheezing Gastrointestinal: no N/no V/no D/no C/no acid reflux Musculoskeletal: no muscle aches/no joint aches Skin: no rashes, no hair loss Neurological: no tremors/no numbness/no tingling/no dizziness  I reviewed pt's  medications, allergies, PMH, social hx, family hx, and changes were documented in the history of present illness. Otherwise, unchanged from my initial visit note.  Past Medical History:  Diagnosis Date  . Anemia   . Arthritis   . Cancer ([place])    stg II colon cancer s/p resection  . Colonic mass   . Diabetes mellitus   . FAP (familial adenomatous polyposis)   . Heart murmur   . Hyperlipidemia   . Hypertension    not on blood pressure meds since 11/13   . Hypothyroidism   . Nasal congestion    Past Surgical History:  Procedure Laterality Date  . BIOPSY  01/15/2018   Procedure: BIOPSY;  Surgeon: Leighton Ruff, MD;  Location: [facility];  Service: Endoscopy;;  . BOWEL RESECTION  05/20/2012  . COLON RESECTION  05/20/2012   Procedure: COLON RESECTION LAPAROSCOPIC;  Surgeon: Adin Hector, MD;  Location: [facility];  Service: General;  Laterality: N/A;  Laparoscopic Proctocolectomy, Ileal Pouch Anal Anastomoisis, Loop Ileostomy  . ESOPHAGOGASTRODUODENOSCOPY (EGD) WITH PROPOFOL N/A 04/23/2013   Procedure: ESOPHAGOGASTRODUODENOSCOPY (EGD) WITH PROPOFOL;  Surgeon: Milus Banister, MD;  Location: [facility];  Service: Endoscopy;  Laterality: N/A;  side viewing scope  . FLEXIBLE SIGMOIDOSCOPY  08/01/2012   Procedure: FLEXIBLE SIGMOIDOSCOPY;  Surgeon: Leighton Ruff, MD;  Location: [facility];  Service: Endoscopy;  Laterality: N/A;  use endoscope  . FLEXIBLE SIGMOIDOSCOPY N/A 11/29/2014   Procedure: FLEXIBLE SIGMOIDOSCOPY/POUCHOSCOPY;  Surgeon: Leighton Ruff, MD;  [facility]: [facility];  Service: Endoscopy;  Laterality: N/A;  . ILEOSTOMY  05/20/12  . ILEOSTOMY CLOSURE N/A 09/18/2012   Procedure: Loop Ileostomy Takedown with EUA ;  Surgeon: Adin Hector, MD;  Location: [facility];  Service: General;  Laterality: N/A;  loop ileostomy takedown with EUA   . KNEE ARTHROSCOPY  1998   Right knee  . POUCHOSCOPY  11/16/2013   Procedure: POUCHOSCOPY;  Surgeon: Leighton Ruff, MD;  Location: [facility];   Service: Endoscopy;;  . POUCHOSCOPY N/A 11/16/2015   Procedure: POUCHOSCOPY;  Surgeon: Leighton Ruff, MD;  Location: [facility];  Service: Endoscopy;  Laterality: N/A;  . POUCHOSCOPY N/A 12/20/2016   Procedure: POUCHOSCOPY;  Surgeon: Leighton Ruff, MD;  [facility];  Service: Endoscopy;  Laterality: N/A;  . POUCHOSCOPY N/A 01/15/2018   Procedure: POUCHOSCOPY;  Surgeon: Leighton Ruff, MD;  Location: [facility];  Service: Endoscopy;  Laterality: N/A;  . THYROID LOBECTOMY  05/20/2012   Procedure: THYROID LOBECTOMY;  Surgeon: Adin Hector, MD;  Location: [facility];  Service: General;  Laterality: Left;  LEFT THYROID LOBECTOMY  . UPPER GASTROINTESTINAL ENDOSCOPY    . WISDOM TOOTH EXTRACTION     Social History   Socioeconomic History  . Marital status: Married    Spouse  name: June  . Number of children: 0  Social Needs  Occupational History  . 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 Smoking status: Former Smoker    Last attempt to quit: 08/07/1991    Years since quitting: 25.9  . Smokeless tobacco: Never Used  . Tobacco comment: quit in 1993  Substance and Sexual Activity  . Alcohol use: No  . Drug use: No  . Sexual activity: Not on file  Other Topics Concern  . 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 aspirin 81 MG tablet Take 81 mg by mouth daily.    Marland Kitchen atorvastatin (LIPITOR) 20 MG tablet TAKE 1 TABLET BY MOUTH ONCE DAILY 90 tablet 1  . Blood Glucose Monitoring Suppl (ONE TOUCH ULTRA SYSTEM KIT) W/DEVICE KIT Dispense 1 kit with 100 lancets and strips, with 3 rf on lancets and strips.  Check sugar daily.  Dx E11.9 1 each 0  . cholecalciferol (VITAMIN D) 1000 UNITS  tablet Take 1,000 Units by mouth daily.     . Dulaglutide (TRULICITY) 1.5 LM/7.8ML SOPN Inject 1.5 mg weekly 4 pen 11  . ferrous sulfate 325 (65 FE) MG tablet Take 325 mg by mouth 2 (two) times daily.  0  . fluticasone (FLONASE) 50 MCG/ACT nasal spray Place 2 sprays into both nostrils daily. (Patient taking differently: Place 2 sprays into both nostrils daily as needed for allergies. ) 16 g 5  . glimepiride (AMARYL) 2 MG tablet 2 tabs in the AM and 1 tab in the PM 270 tablet 3  . glucose blood (ONE TOUCH ULTRA TEST) test strip Use to check sugar daily, h/o DM2. 100 each 3  . JARDIANCE 10 MG TABS tablet TAKE 1 TABLET BY MOUTH ONCE DAILY 30 tablet 5  . levothyroxine (SYNTHROID, LEVOTHROID) 100 MCG tablet TAKE 1 TABLET BY MOUTH EVERY MORNING 30 tablet 7  . loperamide (IMODIUM A-D) 2 MG tablet Take 1-2 tablets (2-4 mg total) by mouth 2 (two) times daily as needed for diarrhea or loose stools. For diarrhea 30 tablet 5  . loratadine (CLARITIN) 10 MG tablet Take 10 mg by mouth daily.     . metFORMIN (GLUCOPHAGE) 1000 MG tablet Take 2 tablets (2,000 mg total) by mouth daily after supper. 60 tablet 0  . vitamin B-12 (CYANOCOBALAMIN) 500 MCG tablet Take 500 mcg by mouth daily.     No current facility-administered medications on file prior to visit.    Allergies  Allergen Reactions  . Ibuprofen Swelling    Made his joints swell with high dose   Family History  Problem Relation Age of Onset  . Uterine cancer Mother   . Hypertension Mother   . Stroke Mother   . Colon cancer Mother   . Colon polyps Mother   . Cancer Mother        colon, endometrial  . Diabetes Father   . Obesity Father   . Pneumonia Father   . Cancer Father        skin - squamous   . Diabetes Paternal Grandmother   . Prostate cancer Neg Hx     PE: BP  140/70   Pulse 90   Ht 5' 10"  (1.778 m)   Wt 245 lb (111.1 kg)   SpO2 96%   BMI 35.15 kg/m  Wt Readings from Last 3 Encounters:  05/22/18 245 lb (111.1 kg)  05/01/18  246 lb (111.6 kg)  02/03/18 245 lb 9.6 oz (111.4 kg)   Constitutional: overweight, in NAD Eyes: PERRLA, EOMI, no exophthalmos ENT: moist mucous membranes, no masses palpated in the neck, no cervical lymphadenopathy Cardiovascular: RRR, No MRG Respiratory: CTA B Gastrointestinal: abdomen soft, NT, ND, BS+ Musculoskeletal: no deformities, strength intact in all 4 Skin: moist, warm, no rashes Neurological: no tremor with outstretched hands, DTR normal in all 4  ASSESSMENT: 1. DM2, non-insulin-dependent, uncontrolled, with complications - CKD stage 2  2. H/o Hurthle cell neoplasm  3.  Postsurgical hypothyroidism  PLAN:  1. Patient with long-standing, uncontrolled, type 2 diabetes, on oral antidiabetic regimen and also GLP-1 receptor agonist.  He has no GI side effects from this.  He continues a low-dose of Jardiance at night, to avoid increased urination during work hours.  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  At last visit, the morning sugars are higher than target so we discussed about moving the entire metformin dose at night.  We also discussed about instituting diet changes to reduce his insulin resistance.  At that time, his HbA1c improved to 8.6%, but was still higher than target. - At this visit, per review of his excellent blood sugar log, sugars are slightly better than before, not very variable, between 130 and 160.  A CBG checked at today's visit was 134 after a small breakfast. - Based on the above sugars, no changes are needed in his regimen for now.  He just restarted exercise and I strongly advised him to continue.  He is usually doing a great job with this exercising at least 30 minutes a day on his stationary bike. - I suggested to:  Patient Instructions  Please continue: - Metformin 2000 mg with dinner - Glimepiride 4 mg in am and 2 mg in pm - Jardiance 10 mg daily at night - Trulicity 1.5 mg weekly  Please continue Levothyroxine 100 mcg  daily.  Take the thyroid hormone every day, with water, at least 30 minutes before breakfast, separated by at least 4 hours from: - acid reflux medications - calcium - iron - multivitamins  Please return in 3 months with your sugar log.   - today, HbA1c is 9.5% (higher than last time and especially higher than expected from his log -we will check a fructosamine level today) - continue checking sugars at different times of the day - check 1x a day, rotating checks - advised for yearly eye exams >> he is UTD - He is also up-to-date with his flu shot - Return to clinic in 3 mo with sugar log     2. H/o Hurthle cell neoplasm -Reviewed latest ultrasound report from the summer and this was negative for nodules in the remaining right thyroid lobe.  I also reviewed his left thyroidectomy pathology: No cancer -On the ultrasound from 2013 he had an enlarged lymph node, however, this was not revealed on the ultrasound from 2019 -No neck compression symptoms -No further imaging needed unless he has neck compression symptoms  3.  Postsurgical hypothyroidism - latest thyroid labs reviewed with pt >> normal  - he continues on LT4 100 mcg daily - pt feels good on this dose. -  we discussed about taking the thyroid hormone every day, with water, >30 minutes before breakfast, separated by >4 hours from acid reflux medications, calcium, iron, multivitamins. Pt. is taking it correctly. - will check thyroid tests today: TSH and fT4 - If labs are abnormal, he will need to return for repeat TFTs in 1.5 months.  Office Visit on 05/22/2018  Component Date Value Ref Range Status  . Fructosamine 05/22/2018 351* 190 - 270 umol/L Final  . TSH 05/22/2018 3.01  0.35 - 4.50 uIU/mL Final  . Free T4 05/22/2018 1.01  0.60 - 1.60 ng/dL Final   Comment: Specimens from patients who are undergoing biotin therapy and /or ingesting biotin supplements may contain high levels of biotin.  The higher biotin concentration in  these specimens interferes with this Free T4 assay.  Specimens that contain high levels  of biotin may cause false high results for this Free T4 assay.  Please interpret results in light of the total clinical presentation of the patient.    Marland Kitchen POC Glucose 05/22/2018 134* 70 - 99 mg/dl Final  . Hemoglobin A1C 05/22/2018 9.5* 4.0 - 5.6 % Final   HbA1c calculated from the fructosamine is much better than the measured one, at 7.6%.  Philemon Kingdom, MD PhD [facility] Endocrinology

## 2018-05-26 ENCOUNTER — Other Ambulatory Visit: Payer: Self-pay | Admitting: Family Medicine

## 2018-05-27 LAB — FRUCTOSAMINE: Fructosamine: 351 umol/L — ABNORMAL HIGH (ref 190–270)

## 2018-06-16 ENCOUNTER — Inpatient Hospital Stay: Payer: BC Managed Care – PPO

## 2018-06-16 ENCOUNTER — Inpatient Hospital Stay: Payer: BC Managed Care – PPO | Attending: Oncology | Admitting: Oncology

## 2018-06-16 ENCOUNTER — Telehealth: Payer: Self-pay | Admitting: Oncology

## 2018-06-16 VITALS — BP 121/69 | HR 64 | Temp 98.0°F | Resp 17 | Ht 70.0 in | Wt 244.3 lb

## 2018-06-16 DIAGNOSIS — E119 Type 2 diabetes mellitus without complications: Secondary | ICD-10-CM | POA: Insufficient documentation

## 2018-06-16 DIAGNOSIS — C180 Malignant neoplasm of cecum: Secondary | ICD-10-CM

## 2018-06-16 DIAGNOSIS — Z85038 Personal history of other malignant neoplasm of large intestine: Secondary | ICD-10-CM | POA: Diagnosis not present

## 2018-06-16 LAB — CEA (IN HOUSE-CHCC): CEA (CHCC-In House): 1.45 ng/mL (ref 0.00–5.00)

## 2018-06-16 NOTE — Progress Notes (Signed)
  [facility] OFFICE PROGRESS NOTE   Diagnosis: Colon cancer  INTERVAL HISTORY:   Mr. Pittsley returns as scheduled.  He feels well.  Good appetite and energy level.  He continues surveillance of the J pouch with Dr. Marcello Moores.  He is now on an every 2-year upper endoscopy schedule.  Objective:  Vital signs in last 24 hours:  Blood pressure 121/69, pulse 64, temperature 98 F (36.7 C), temperature source Oral, resp. rate 17, height 5\' 10"  (1.778 m), weight 244 lb 4.8 oz (110.8 kg), SpO2 98 %.    HEENT: Neck without mass Lymphatics: No cervical, supraclavicular, axillary, or inguinal nodes Resp: Lungs clear bilaterally Cardio: Regular rate and rhythm GI: No hepatomegaly, no mass, nontender Vascular: No leg edema   Lab Results:   Lab Results  Component Value Date   [name]1 1.45 06/16/2018     Medications: I have reviewed the patient's current medications.   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 Ileostomy takedown 09/18/2012.  2. Familial polyposis with multiple adenomatous polyps noted on the colectomy specimen 05/20/2012 -followed At [facility] and by Dr. Marcello Moores for surveillance endoscopies.  3. 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  4. Diabetes.  5. Status post endoscopic removal of a duodenal polyp at [facility] on 06/08/2013 and 10/25/2014, removal of adenoma at the minor papilla at [facility] 05/21/2017     Disposition: Cory Harper is in clinical remission from colon cancer.  He would like to continue follow-up at the Cancer center.  He will return for an office visit in 1 year. He continues endoscopic surveillance with Dr. Marcello Moores and at [facility].  15 minutes were spent with the patient today.  The majority of the time was used for counseling and  coordination of care.  Betsy Coder, MD  06/16/2018  2:08 PM

## 2018-06-16 NOTE — Telephone Encounter (Signed)
Scheduled appt per 11/11 los - gave patient AVS and calender per los.   

## 2018-06-17 ENCOUNTER — Other Ambulatory Visit: Payer: Self-pay | Admitting: Family Medicine

## 2018-06-18 NOTE — Telephone Encounter (Signed)
Electronic refill request Last office visit 05/01/18 Last refill 05/26/17 #48

## 2018-06-19 NOTE — Telephone Encounter (Signed)
Sent. Thanks.   

## 2018-06-27 ENCOUNTER — Other Ambulatory Visit: Payer: Self-pay | Admitting: Family Medicine

## 2018-07-13 ENCOUNTER — Other Ambulatory Visit: Payer: Self-pay | Admitting: Family Medicine

## 2018-07-16 ENCOUNTER — Encounter: Payer: Self-pay | Admitting: *Deleted

## 2018-08-02 ENCOUNTER — Other Ambulatory Visit: Payer: Self-pay | Admitting: Family Medicine

## 2018-08-18 ENCOUNTER — Other Ambulatory Visit: Payer: Self-pay | Admitting: Family Medicine

## 2018-08-18 ENCOUNTER — Other Ambulatory Visit: Payer: BC Managed Care – PPO

## 2018-08-18 DIAGNOSIS — E1122 Type 2 diabetes mellitus with diabetic chronic kidney disease: Secondary | ICD-10-CM

## 2018-08-18 DIAGNOSIS — E538 Deficiency of other specified B group vitamins: Secondary | ICD-10-CM

## 2018-08-18 DIAGNOSIS — N182 Chronic kidney disease, stage 2 (mild): Principal | ICD-10-CM

## 2018-08-20 ENCOUNTER — Telehealth: Payer: Self-pay

## 2018-08-20 NOTE — Telephone Encounter (Signed)
Pt left v/m that he missed a call and request cb on 08/21/18 after 9:15 AM or 08/20/18 after 5PM. Pt calling about iron pills. Pt uses [facility] [address]

## 2018-08-20 NOTE — Telephone Encounter (Signed)
Pt left v/m requesting a cb about a refill; pt did not leave name of medication and when I tried to call pt did not get answer.

## 2018-08-21 ENCOUNTER — Other Ambulatory Visit: Payer: Self-pay | Admitting: *Deleted

## 2018-08-21 DIAGNOSIS — C180 Malignant neoplasm of cecum: Secondary | ICD-10-CM

## 2018-08-21 DIAGNOSIS — E611 Iron deficiency: Secondary | ICD-10-CM

## 2018-08-21 NOTE — Telephone Encounter (Signed)
Patient says another MD (? Gross) was prescribing this but he has not been able to get in touch with that office and asks if you would prescribe it for him?

## 2018-08-22 MED ORDER — FERROUS SULFATE 325 (65 FE) MG PO TABS: 325.0000 mg | ORAL_TABLET | Freq: Two times a day (BID) | ORAL | 5 refills | Status: DC

## 2018-08-22 NOTE — Telephone Encounter (Signed)
Sent. Thanks.   

## 2018-08-25 ENCOUNTER — Encounter: Payer: BC Managed Care – PPO | Admitting: Family Medicine

## 2018-08-26 ENCOUNTER — Other Ambulatory Visit: Payer: Self-pay | Admitting: Family Medicine

## 2018-08-28 NOTE — Telephone Encounter (Signed)
Sent. Thanks.   

## 2018-09-03 ENCOUNTER — Other Ambulatory Visit: Payer: Self-pay | Admitting: Family Medicine

## 2018-09-03 NOTE — Telephone Encounter (Signed)
Sent. Thanks.   

## 2018-09-03 NOTE — Telephone Encounter (Signed)
Last office visit 05/01/2018 for 4 month follow up.  Last refilled 08/04/2018 for #48 with no refills.  CPE scheduled for 09/23/2018.  Ok to refill?

## 2018-09-11 ENCOUNTER — Ambulatory Visit: Payer: BC Managed Care – PPO | Admitting: Internal Medicine

## 2018-09-15 ENCOUNTER — Other Ambulatory Visit: Payer: Self-pay | Admitting: Family Medicine

## 2018-09-18 ENCOUNTER — Other Ambulatory Visit (INDEPENDENT_AMBULATORY_CARE_PROVIDER_SITE_OTHER): Payer: BC Managed Care – PPO

## 2018-09-18 DIAGNOSIS — E611 Iron deficiency: Secondary | ICD-10-CM | POA: Diagnosis not present

## 2018-09-18 DIAGNOSIS — E1122 Type 2 diabetes mellitus with diabetic chronic kidney disease: Secondary | ICD-10-CM | POA: Diagnosis not present

## 2018-09-18 DIAGNOSIS — E538 Deficiency of other specified B group vitamins: Secondary | ICD-10-CM

## 2018-09-18 DIAGNOSIS — N182 Chronic kidney disease, stage 2 (mild): Secondary | ICD-10-CM | POA: Diagnosis not present

## 2018-09-18 LAB — CBC WITH DIFFERENTIAL/PLATELET
BASOS PCT: 0.9 % (ref 0.0–3.0)
Basophils Absolute: 0 10*3/uL (ref 0.0–0.1)
EOS PCT: 3.7 % (ref 0.0–5.0)
Eosinophils Absolute: 0.2 10*3/uL (ref 0.0–0.7)
HCT: 39.5 % (ref 39.0–52.0)
Hemoglobin: 13.2 g/dL (ref 13.0–17.0)
LYMPHS ABS: 1.7 10*3/uL (ref 0.7–4.0)
Lymphocytes Relative: 34.1 % (ref 12.0–46.0)
MCHC: 33.5 g/dL (ref 30.0–36.0)
MCV: 91.9 fl (ref 78.0–100.0)
MONO ABS: 0.3 10*3/uL (ref 0.1–1.0)
Monocytes Relative: 6.7 % (ref 3.0–12.0)
NEUTROS ABS: 2.7 10*3/uL (ref 1.4–7.7)
NEUTROS PCT: 54.6 % (ref 43.0–77.0)
PLATELETS: 223 10*3/uL (ref 150.0–400.0)
RBC: 4.3 Mil/uL (ref 4.22–5.81)
RDW: 14 % (ref 11.5–15.5)
WBC: 4.9 10*3/uL (ref 4.0–10.5)

## 2018-09-18 LAB — COMPREHENSIVE METABOLIC PANEL
ALT: 19 U/L (ref 0–53)
AST: 20 U/L (ref 0–37)
Albumin: 3.8 g/dL (ref 3.5–5.2)
Alkaline Phosphatase: 68 U/L (ref 39–117)
BUN: 17 mg/dL (ref 6–23)
CO2: 25 mEq/L (ref 19–32)
Calcium: 9.1 mg/dL (ref 8.4–10.5)
Chloride: 102 mEq/L (ref 96–112)
Creatinine, Ser: 1.33 mg/dL (ref 0.40–1.50)
GFR: 54.64 mL/min — ABNORMAL LOW (ref >60.00)
Glucose, Bld: 101 mg/dL — ABNORMAL HIGH (ref 70–99)
Potassium: 3.9 mEq/L (ref 3.5–5.1)
SODIUM: 135 meq/L (ref 135–145)
Total Bilirubin: 0.7 mg/dL (ref 0.2–1.2)
Total Protein: 6.8 g/dL (ref 6.0–8.3)

## 2018-09-18 LAB — IBC PANEL
IRON: 56 ug/dL (ref 42–165)
Saturation Ratios: 15.6 % — ABNORMAL LOW (ref 20.0–50.0)
Transferrin: 257 mg/dL (ref 212.0–360.0)

## 2018-09-18 LAB — LIPID PANEL
Cholesterol: 108 mg/dL (ref 0–200)
HDL: 37.2 mg/dL — ABNORMAL LOW (ref >39.00)
LDL CALC: 37 mg/dL (ref 0–99)
NonHDL: 71.28
Total CHOL/HDL Ratio: 3
Triglycerides: 169 mg/dL — ABNORMAL HIGH (ref 0.0–149.0)
VLDL: 33.8 mg/dL (ref 0.0–40.0)

## 2018-09-18 LAB — VITAMIN B12: VITAMIN B 12: 1327 pg/mL — AB (ref 211–911)

## 2018-09-18 LAB — HEMOGLOBIN A1C: Hgb A1c MFr Bld: 9.9 % — ABNORMAL HIGH (ref 4.6–6.5)

## 2018-09-23 ENCOUNTER — Encounter: Payer: BC Managed Care – PPO | Admitting: Family Medicine

## 2018-09-30 ENCOUNTER — Encounter: Payer: BC Managed Care – PPO | Admitting: Family Medicine

## 2018-10-07 ENCOUNTER — Ambulatory Visit (INDEPENDENT_AMBULATORY_CARE_PROVIDER_SITE_OTHER): Payer: BC Managed Care – PPO | Admitting: Family Medicine

## 2018-10-07 ENCOUNTER — Encounter: Payer: Self-pay | Admitting: Family Medicine

## 2018-10-07 VITALS — BP 110/60 | HR 62 | Temp 97.7°F | Ht 70.0 in | Wt 241.4 lb

## 2018-10-07 DIAGNOSIS — Z0000 Encounter for general adult medical examination without abnormal findings: Secondary | ICD-10-CM | POA: Diagnosis not present

## 2018-10-07 DIAGNOSIS — Z7189 Other specified counseling: Secondary | ICD-10-CM

## 2018-10-07 DIAGNOSIS — E890 Postprocedural hypothyroidism: Secondary | ICD-10-CM

## 2018-10-07 DIAGNOSIS — N182 Chronic kidney disease, stage 2 (mild): Secondary | ICD-10-CM

## 2018-10-07 DIAGNOSIS — Z23 Encounter for immunization: Secondary | ICD-10-CM

## 2018-10-07 DIAGNOSIS — E1122 Type 2 diabetes mellitus with diabetic chronic kidney disease: Secondary | ICD-10-CM

## 2018-10-07 DIAGNOSIS — E538 Deficiency of other specified B group vitamins: Secondary | ICD-10-CM

## 2018-10-07 DIAGNOSIS — E7800 Pure hypercholesterolemia, unspecified: Secondary | ICD-10-CM

## 2018-10-07 DIAGNOSIS — C180 Malignant neoplasm of cecum: Secondary | ICD-10-CM

## 2018-10-07 MED ORDER — FERROUS SULFATE 325 (65 FE) MG PO TABS: 325.0000 mg | ORAL_TABLET | Freq: Two times a day (BID) | ORAL | 3 refills | Status: DC

## 2018-10-07 MED ORDER — METFORMIN HCL 1000 MG PO TABS: ORAL_TABLET | ORAL | 3 refills | Status: DC

## 2018-10-07 MED ORDER — LEVOTHYROXINE SODIUM 100 MCG PO TABS: 100.0000 ug | ORAL_TABLET | Freq: Every morning | ORAL | 3 refills | Status: DC

## 2018-10-07 MED ORDER — ATORVASTATIN CALCIUM 20 MG PO TABS: 20.0000 mg | ORAL_TABLET | Freq: Every day | ORAL | 3 refills | Status: DC

## 2018-10-07 MED ORDER — LOPERAMIDE HCL 2 MG PO TABS: ORAL_TABLET | ORAL | 3 refills | Status: DC

## 2018-10-07 MED ORDER — GLIMEPIRIDE 2 MG PO TABS: ORAL_TABLET | ORAL | 3 refills | Status: DC

## 2018-10-07 MED ORDER — CYANOCOBALAMIN 250 MCG PO TABS: 250.0000 ug | ORAL_TABLET | Freq: Every day | ORAL | Status: DC

## 2018-10-07 MED ORDER — EMPAGLIFLOZIN 10 MG PO TABS: 10.0000 mg | ORAL_TABLET | Freq: Every day | ORAL | 3 refills | Status: DC

## 2018-10-07 NOTE — Progress Notes (Signed)
CPE- See plan.  Routine anticipatory guidance given to patient.  See health maintenance.  The possibility exists that previously documented standard health maintenance information may have been brought forward from a previous encounter into this note.  If needed, that same information has been updated to reflect the current situation based on today's encounter.    Tetanus 2020 Flu done 2019 PNA 2014 Shingles prev done.  J pouch eval by Dr. Marcello Moores 2019 Prostate cancer screening and PSA options(with potential risks and benefits of testing vs not testing) were discussed along with recent recs/guidelines. He declined testing PSAat this point. Living will d/w pt. Wife designated if patient were incapacitated.  Diet and exercise d/w pt.  HIV and HCV prev checked 2017.    DM2.  Still on baseline meds.  A1c still up, d/w pt.  He is going to f/u with endo.  Sugar 161 this AM, "better when I behave myself."  He is getting back on exercise bike.  Diet d/w pt.  We talked about substitutions for lower carbs.  He is trying to work within his GI limitations for fiber tolerance.  He can use current meds and still work, but not insulin.  No lows.    Elevated Cholesterol: Using medications without problems: yes Muscle aches: no Diet compliance: encouraged.  Exercise:encouraged  B12.  Not low, now elevated. On replacement.  D/w pt about options.   Hypothyroidism.  On replacement.  Compliant.  No neck mass.    PMH and SH reviewed Meds, vitals, and allergies reviewed.   ROS: Per HPI.  Unless specifically indicated otherwise in HPI, the patient denies:  General: fever. Eyes: acute vision changes ENT: sore throat Cardiovascular: chest pain Respiratory: SOB GI: vomiting GU: dysuria Musculoskeletal: acute back pain Derm: acute rash Neuro: acute motor dysfunction Psych: worsening mood Endocrine: polydipsia Heme: bleeding Allergy: hayfever  GEN: nad, alert and oriented HEENT: mucous membranes  moist NECK: supple w/o LA CV: rrr. PULM: ctab, no inc wob ABD: soft, +bs EXT: no edema SKIN: no acute rash  Diabetic foot exam: Normal inspection No skin breakdown No calluses  Normal DP pulses Normal sensation to light touch and monofilament Nails normal

## 2018-10-07 NOTE — Patient Instructions (Signed)
Tetanus shot today.  Cut the B12 back to 23mcg a day.   Follow up with Dr. Cruzita Lederer and keep working on diet in the meantime.  Take care.  Glad to see you.

## 2018-10-08 NOTE — Assessment & Plan Note (Signed)
Status post resection withJ pouch eval by Dr. Marcello Moores 2019

## 2018-10-08 NOTE — Assessment & Plan Note (Signed)
Continue statin.  Needs to continue work on diet and exercise.

## 2018-10-08 NOTE — Assessment & Plan Note (Signed)
On replacement.  Compliant.  No neck mass.

## 2018-10-08 NOTE — Assessment & Plan Note (Signed)
Still on baseline meds.  A1c still up, d/w pt.  He is going to f/u with endo.  Sugar 161 this AM, "better when I behave myself."  He is getting back on exercise bike.  Diet d/w pt.  We talked about substitutions for lower carbs.  He is trying to work within his GI limitations for fiber tolerance.  He can use current meds and still work, but not insulin.  No lows.   Given that he cannot use insulin based on his current job requirements, I do not see any other good options for medicine treatment other than diet and exercise.  I would like him to follow-up with endocrinology.  He agreed.

## 2018-10-08 NOTE — Assessment & Plan Note (Signed)
Living will d/w pt.  Wife designated if patient were incapacitated.   ?

## 2018-10-08 NOTE — Assessment & Plan Note (Signed)
Not low, now elevated. On replacement.  D/w pt about options.  Would cut back to 250 mcg a day.

## 2018-10-08 NOTE — Assessment & Plan Note (Signed)
Tetanus 2020 Flu done 2019 PNA 2014 Shingles prev done.  J pouch eval by Dr. Marcello Moores 2019 Prostate cancer screening and PSA options(with potential risks and benefits of testing vs not testing) were discussed along with recent recs/guidelines. He declined testing PSAat this point. Living will d/w pt. Wife designated if patient were incapacitated.  Diet and exercise d/w pt.  HIV and HCV prev checked 2017.

## 2018-10-23 ENCOUNTER — Telehealth: Payer: Self-pay | Admitting: Internal Medicine

## 2018-10-23 MED ORDER — DULAGLUTIDE 1.5 MG/0.5ML ~~LOC~~ SOAJ: SUBCUTANEOUS | 11 refills | Status: DC

## 2018-10-23 NOTE — Telephone Encounter (Signed)
MEDICATION: Trulicity  PHARMACY:  [facility] on [address] A 90 DAY SUPPLY : unknown  IS PATIENT OUT OF MEDICATION: yes  IF NOT; HOW MUCH IS LEFT:   LAST APPOINTMENT DATE: @10 /17/2019  NEXT APPOINTMENT DATE:@5 /01/2019  DO WE HAVE YOUR PERMISSION TO LEAVE A DETAILED MESSAGE: yes  OTHER COMMENTS:    **Let patient know to contact pharmacy at the end of the day to make sure medication is ready. **  ** Please notify patient to allow 48-72 hours to process**  **Encourage patient to contact the pharmacy for refills or they can request refills through [facility]**

## 2018-10-23 NOTE — Telephone Encounter (Signed)
RX sent

## 2018-12-10 ENCOUNTER — Ambulatory Visit: Payer: BC Managed Care – PPO | Admitting: Internal Medicine

## 2019-01-23 ENCOUNTER — Telehealth: Payer: Self-pay | Admitting: Family Medicine

## 2019-01-23 NOTE — Telephone Encounter (Signed)
I did not see anything in the last note. Will forward to Dr Damita Dunnings to decide.

## 2019-01-23 NOTE — Telephone Encounter (Signed)
Patient was seen in March for his physical.  Patient said normally he has a follow up appointment with Dr.Duncan.  Patient said he wasn't told to follow up.  Patient wants to know when he needs to follow up.

## 2019-01-25 NOTE — Telephone Encounter (Signed)
Per AVS, the plan was for him to follow-up with endocrinology in the meantime.  I think that still makes sense.  I think it makes sense to get together here in the fall, assuming COVID allows.  Thanks.

## 2019-01-26 NOTE — Telephone Encounter (Signed)
Left detailed message on voicemail.  

## 2019-01-26 NOTE — Telephone Encounter (Signed)
See refill note on 08/21/18

## 2019-01-28 ENCOUNTER — Ambulatory Visit: Payer: BC Managed Care – PPO | Admitting: Internal Medicine

## 2019-01-28 ENCOUNTER — Encounter

## 2019-02-02 ENCOUNTER — Ambulatory Visit: Payer: Self-pay | Admitting: General Surgery

## 2019-02-13 ENCOUNTER — Other Ambulatory Visit: Payer: Self-pay | Admitting: Family Medicine

## 2019-03-05 ENCOUNTER — Telehealth: Payer: Self-pay

## 2019-03-05 NOTE — Telephone Encounter (Signed)
Pt called and will drop off DMV form on 03/06/19 in AM for Dr Damita Dunnings to review to be filled out. Dr Damita Dunnings will look at to see what he can do and may need to send to other providers (Endo). Pt voiced understanding and will drop off 03/06/19. FYI to Dr Damita Dunnings.

## 2019-03-06 NOTE — Telephone Encounter (Signed)
Noted. Thanks.

## 2019-03-09 ENCOUNTER — Telehealth: Payer: Self-pay | Admitting: Family Medicine

## 2019-03-09 NOTE — Telephone Encounter (Signed)
I'll look at this after I return to the office. Thanks.

## 2019-03-09 NOTE — Telephone Encounter (Signed)
Placed in Dr. Josefine Class inbox

## 2019-03-09 NOTE — Telephone Encounter (Signed)
Pt dropped off [place] DMV Medical Review papers to be filled out. Placed forms in RX tower for Dr. Damita Dunnings. Please call patient once it is complete.  CB [phone]

## 2019-03-19 NOTE — Telephone Encounter (Signed)
Patient advised and understands that doctor is busy. Patient states if at all possible would like to get this by the end of this week or beginning of next week so he can get it in the mail on time. Routing to Dr. Damita Dunnings to have this note for follow up. Thank you

## 2019-03-19 NOTE — Telephone Encounter (Signed)
Pt called triage asking the status of his form. Pt said it's getting close to the deadline and is requesting an update on the form asap. CB # [phone]

## 2019-03-19 NOTE — Telephone Encounter (Signed)
I will work on this when I can and we'll let him know.

## 2019-03-20 NOTE — Telephone Encounter (Signed)
I will work on it.  Thanks.  

## 2019-03-23 NOTE — Telephone Encounter (Addendum)
Left detailed message on voicemail that patient's form is ready for pickup and that Dr. Damita Dunnings wants an update on his weight and blood sugars.  Form left at front desk for pickup.

## 2019-03-23 NOTE — Telephone Encounter (Signed)
Form done.  Scanned.  Please have patient pick up.   He has follow-up with endocrinology pending.  Please get update on his sugar and weight in the meantime.  Thanks.

## 2019-03-29 ENCOUNTER — Telehealth: Payer: Self-pay | Admitting: Family Medicine

## 2019-03-29 NOTE — Telephone Encounter (Signed)
-----   Message from Cory Harper, [place] sent at 03/23/2019  5:44 PM EDT ----- Regarding: Update on weight and BS per your request Patient came by to pick up form.  Dr. Damita Dunnings asked for update on patient's weight and BS./ Weight 243   BS  [phone]

## 2019-03-29 NOTE — Telephone Encounter (Signed)
Noted. Thanks.  Readings are reasonable for now.  I wanted him to f/u with endo when possible.

## 2019-03-30 NOTE — Telephone Encounter (Signed)
Left detailed message on voicemail. DPR 

## 2019-03-31 ENCOUNTER — Other Ambulatory Visit (HOSPITAL_COMMUNITY): Payer: BC Managed Care – PPO

## 2019-04-05 ENCOUNTER — Other Ambulatory Visit: Payer: Self-pay | Admitting: Family Medicine

## 2019-05-15 ENCOUNTER — Other Ambulatory Visit: Payer: Self-pay

## 2019-05-19 ENCOUNTER — Ambulatory Visit: Payer: BC Managed Care – PPO | Admitting: Internal Medicine

## 2019-05-19 ENCOUNTER — Other Ambulatory Visit (HOSPITAL_COMMUNITY): Payer: BC Managed Care – PPO

## 2019-05-22 ENCOUNTER — Ambulatory Visit (HOSPITAL_COMMUNITY): Admit: 2019-05-22 | Payer: BC Managed Care – PPO | Admitting: General Surgery

## 2019-05-22 ENCOUNTER — Encounter (HOSPITAL_COMMUNITY): Payer: Self-pay

## 2019-05-22 SURGERY — ENDOSCOPY, POUCH, SMALL INTESTINE, DIAGNOSTIC
Anesthesia: IV Sedation (MBSC Only)

## 2019-06-17 ENCOUNTER — Telehealth: Payer: Self-pay

## 2019-06-17 ENCOUNTER — Telehealth: Payer: Self-pay | Admitting: Oncology

## 2019-06-17 ENCOUNTER — Other Ambulatory Visit: Payer: Self-pay

## 2019-06-17 ENCOUNTER — Encounter: Payer: Self-pay | Admitting: Internal Medicine

## 2019-06-17 ENCOUNTER — Ambulatory Visit (INDEPENDENT_AMBULATORY_CARE_PROVIDER_SITE_OTHER): Payer: BC Managed Care – PPO | Admitting: Internal Medicine

## 2019-06-17 ENCOUNTER — Inpatient Hospital Stay: Payer: BC Managed Care – PPO | Attending: Oncology | Admitting: Oncology

## 2019-06-17 VITALS — BP 160/60 | HR 87 | Ht 70.0 in | Wt 234.0 lb

## 2019-06-17 VITALS — BP 128/80 | HR 93 | Temp 98.7°F | Resp 18 | Ht 70.0 in | Wt 235.0 lb

## 2019-06-17 DIAGNOSIS — N182 Chronic kidney disease, stage 2 (mild): Secondary | ICD-10-CM

## 2019-06-17 DIAGNOSIS — C180 Malignant neoplasm of cecum: Secondary | ICD-10-CM | POA: Diagnosis not present

## 2019-06-17 DIAGNOSIS — Z86010 Personal history of colonic polyps: Secondary | ICD-10-CM | POA: Insufficient documentation

## 2019-06-17 DIAGNOSIS — D34 Benign neoplasm of thyroid gland: Secondary | ICD-10-CM

## 2019-06-17 DIAGNOSIS — E890 Postprocedural hypothyroidism: Secondary | ICD-10-CM | POA: Diagnosis not present

## 2019-06-17 DIAGNOSIS — E119 Type 2 diabetes mellitus without complications: Secondary | ICD-10-CM | POA: Diagnosis not present

## 2019-06-17 DIAGNOSIS — E1122 Type 2 diabetes mellitus with diabetic chronic kidney disease: Secondary | ICD-10-CM

## 2019-06-17 DIAGNOSIS — D126 Benign neoplasm of colon, unspecified: Secondary | ICD-10-CM | POA: Diagnosis not present

## 2019-06-17 DIAGNOSIS — Z79899 Other long term (current) drug therapy: Secondary | ICD-10-CM | POA: Insufficient documentation

## 2019-06-17 DIAGNOSIS — E669 Obesity, unspecified: Secondary | ICD-10-CM

## 2019-06-17 LAB — TSH: TSH: 3.33 u[IU]/mL (ref 0.35–4.50)

## 2019-06-17 LAB — POCT GLYCOSYLATED HEMOGLOBIN (HGB A1C): Hemoglobin A1C: 9.2 % — AB (ref 4.0–5.6)

## 2019-06-17 LAB — T4, FREE: Free T4: 1.09 ng/dL (ref 0.60–1.60)

## 2019-06-17 MED ORDER — OZEMPIC (1 MG/DOSE) 2 MG/1.5ML ~~LOC~~ SOPN: 1.0000 mg | PEN_INJECTOR | SUBCUTANEOUS | 2 refills | Status: DC

## 2019-06-17 MED ORDER — TRULICITY 3 MG/0.5ML ~~LOC~~ SOAJ: 3.0000 mg | SUBCUTANEOUS | 3 refills | Status: DC

## 2019-06-17 NOTE — Telephone Encounter (Signed)
Gave avs and calendar ° °

## 2019-06-17 NOTE — Progress Notes (Addendum)
Patient ID: Cory Harper, male   DOB: 1955/11/12, 63 y.o.   MRN: [id number]   HPI: Cory Harper is a 63 y.o.-year-old male, returning for follow-up for DM2, dx in 1998, non-insulin-dependent, uncontrolled, with long term complications (+ mild CKD). Last visit 1 year 1 month ago!   Reviewed his HbA1c levels Lab Results  Component Value Date   HGBA1C 9.9 (H) 09/18/2018   HGBA1C 9.5 (A) 05/22/2018   HGBA1C 8.6 (A) 02/03/2018  05/22/2018: HbA1c calculated from the fructosamine is much better than the measured one, at 7.6%.  Pt is on a regimen of: - Metformin 1000 mg 2x a day with meals >> 2000 mg with dinner - Amaryl 4 mg in am and 2 mg in pm - Jardiance 10 mg daily at night to avoid daytime urinary frequency -started 03/2017 - Januvia 100 mg daily in am >> Trulicity 1.5 mg weekly-started 07/2017.  He denies GI side effects. Tried Actos >> signif. Weight gain On Cinnamon.  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  Pt checks his sugars 1-2 times a day: - am: 161-180 >> 78, 122-175 >> 143-160 >> 99, 147-168, 170 - 2h after b'fast: n/c >> 147 >> n/c - before lunch: 107-155, 164 >> 130-154 >> 150-161, 212 - 2h after lunch: n/c >> 171, 178 >> n/c - before dinner: n/c >> 132-178 >> 123 >> n/c >> 151, 160-164 - 2h after dinner: n/c >> 163, 180 >> 130 >> n/c - bedtime: n/c >> 160-180 >> 150-170 >> 150-165 >> 151-166 - nighttime: n/c >> 180 Lowest sugar was 107 >>  130 >> 99; it is unclear at which level she has hypoglycemia unawareness Highest sugar was 175 >> 165 >> 212.  Glucometer: One Touch Ultra mini  Pt's meals are: - Breakfast: bowl of grits, [place] bacon before starts work,  - Lunch: [place] or ham+cheese wrap or tuna salad - Dinner: baked chicken or pork chop, green beans - Snacks: crackers  Works: 5:45-9 am, 2 -5 pm, with few exceptions.  -+ Mild CKD, last BUN/creatinine:  Lab Results  Component Value Date   BUN 17 09/18/2018   BUN 21  07/25/2017   CREATININE 1.33 09/18/2018   CREATININE 1.24 07/25/2017   Lab Results  Component Value Date   GFRNONAA 68 (L) 09/22/2012   + HL; last set of lipids: Lab Results  Component Value Date   CHOL 108 09/18/2018   HDL 37.20 (L) 09/18/2018   LDLCALC 37 09/18/2018   LDLDIRECT 49.4 04/27/2013   TRIG 169.0 (H) 09/18/2018   CHOLHDL 3 09/18/2018  On Lipitor 20. On ASA 81 - last eye exam was in 02/2019: no DR -no numbness and tingling in his feet.    Pt has no FH of DM.  He has familial polyposis and has a history of colon cancer >> has a J-pouch - he have a pouchoscopy in 07/2019.  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  Reviewed thyroid ultrasound report from 02/2018: 1. Post left thyroid lobectomy without evidence of locally residual or locally recurrent disease. 2. Normal appearance of the remaining thyroid parenchyma.  Pt denies: - feeling nodules in neck - hoarseness - dysphagia - choking - SOB with lying down  Postsurgical hypothyroidism:  Pt is on levothyroxine 100 mcg daily, taken: - in am - fasting - at least 30 min from  b'fast - no Ca, Fe, MVI, PPIs - not on Biotin  Reviewed his latest TFTs and these were normal: Lab Results  Component Value Date   TSH 3.01 05/22/2018   TSH 3.46 07/25/2017   TSH 2.29 06/27/2016   TSH 1.65 06/14/2015   TSH 1.63 03/11/2014   TSH 2.22 01/19/2013   TSH 2.49 08/19/2012   TSH 5.577 (H) 06/23/2012   TSH 2.86 04/09/2012   TSH 3.01 09/22/2010    ROS: Constitutional: no weight gain/+ weight loss, no fatigue, no subjective hyperthermia, no subjective hypothermia Eyes: no blurry vision, no xerophthalmia ENT: no sore throat, + see HPI Cardiovascular: no CP/no SOB/no palpitations/no leg swelling Respiratory: no cough/no SOB/no wheezing Gastrointestinal: no N/no V/no D/no C/no acid reflux Musculoskeletal: no muscle  aches/no joint aches Skin: no rashes, no hair loss Neurological: no tremors/no numbness/no tingling/no dizziness  I reviewed pt's medications, allergies, PMH, social hx, family hx, and changes were documented in the history of present illness. Otherwise, unchanged from my initial visit note.  Past Medical History:  Diagnosis Date  . Anemia   . Arthritis   . Cancer ([place])    stg II colon cancer s/p resection  . Colonic mass   . Diabetes mellitus   . FAP (familial adenomatous polyposis)   . Heart murmur   . Hyperlipidemia   . Hypertension    not on blood pressure meds since 11/13   . Hypothyroidism   . Nasal congestion    Past Surgical History:  Procedure Laterality Date  . BIOPSY  01/15/2018   Procedure: BIOPSY;  Surgeon: Leighton Ruff, MD;  Location: [facility];  Service: Endoscopy;;  . BOWEL RESECTION  05/20/2012  . COLON RESECTION  05/20/2012   Procedure: COLON RESECTION LAPAROSCOPIC;  Surgeon: Adin Hector, MD;  Location: [facility];  Service: General;  Laterality: N/A;  Laparoscopic Proctocolectomy, Ileal Pouch Anal Anastomoisis, Loop Ileostomy  . ESOPHAGOGASTRODUODENOSCOPY (EGD) WITH PROPOFOL N/A 04/23/2013   Procedure: ESOPHAGOGASTRODUODENOSCOPY (EGD) WITH PROPOFOL;  Surgeon: Milus Banister, MD;  Location: [facility];  Service: Endoscopy;  Laterality: N/A;  side viewing scope  . FLEXIBLE SIGMOIDOSCOPY  08/01/2012   Procedure: FLEXIBLE SIGMOIDOSCOPY;  Surgeon: Leighton Ruff, MD;  Location: [facility];  Service: Endoscopy;  Laterality: N/A;  use endoscope  . FLEXIBLE SIGMOIDOSCOPY N/A 11/29/2014   Procedure: FLEXIBLE SIGMOIDOSCOPY/POUCHOSCOPY;  Surgeon: Leighton Ruff, MD;  Location: [facility];  Service: Endoscopy;  Laterality: N/A;  . ILEOSTOMY  05/20/12  . ILEOSTOMY CLOSURE N/A 09/18/2012   Procedure: Loop Ileostomy Takedown with EUA ;  Surgeon: Adin Hector, MD;  Location: [facility];  Service: General;  Laterality: N/A;  loop ileostomy takedown with EUA   . KNEE  ARTHROSCOPY  1998   Right knee  . POUCHOSCOPY  11/16/2013   Procedure: POUCHOSCOPY;  Surgeon: Leighton Ruff, MD;  Location: [facility];  Service: Endoscopy;;  . POUCHOSCOPY N/A 11/16/2015   Procedure: POUCHOSCOPY;  Surgeon: Leighton Ruff, MD;  Location: [facility];  Service: Endoscopy;  Laterality: N/A;  . POUCHOSCOPY N/A 12/20/2016   Procedure: POUCHOSCOPY;  Surgeon: Leighton Ruff, MD;  Location[facility];  Service: Endoscopy;  Laterality: N/A;  . POUCHOSCOPY N/A 01/15/2018   Procedure: POUCHOSCOPY;  Surgeon: Leighton Ruff, MD;  Location: [facility];  Service: Endoscopy;  Laterality: N/A;  . THYROID LOBECTOMY  05/20/2012   Procedure: THYROID LOBECTOMY;  Surgeon: Adin Hector, MD;  Location: [facility];  Service: General;  Laterality: Left;  LEFT THYROID LOBECTOMY  . UPPER GASTROINTESTINAL ENDOSCOPY    .  WISDOM TOOTH EXTRACTION     Social History   Socioeconomic History  . Marital status: Married    Spouse name: June  . Number of children: 0  Social Needs  Occupational History  . 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 Smoking status: Former Smoker    Last attempt to quit: 08/07/1991    Years since quitting: 25.9  . Smokeless tobacco: Never Used  . Tobacco comment: quit in 1993  Substance and Sexual Activity  . Alcohol use: No  . Drug use: No  . Sexual activity: Not on file  Other Topics Concern  . 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 aspirin 81 MG tablet Take 81 mg by mouth daily.    Marland Kitchen atorvastatin (LIPITOR) 20 MG tablet Take 1 tablet (20 mg total) by mouth daily. 90 tablet 3  . Blood Glucose Monitoring Suppl (ONE TOUCH ULTRA SYSTEM KIT) W/DEVICE KIT Dispense  1 kit with 100 lancets and strips, with 3 rf on lancets and strips.  Check sugar daily.  Dx E11.9 1 each 0  . cholecalciferol (VITAMIN D) 1000 UNITS tablet Take 1,000 Units by mouth daily.     . Dulaglutide (TRULICITY) 1.5 MP/5.3IR SOPN Inject 1.5 mg weekly 4 pen 11  . empagliflozin (JARDIANCE) 10 MG TABS tablet Take 10 mg by mouth daily. 90 tablet 3  . ferrous sulfate 325 (65 FE) MG tablet Take 1 tablet (325 mg total) by mouth 2 (two) times daily. 180 tablet 3  . fluticasone (FLONASE) 50 MCG/ACT nasal spray USE 2 SPRAYS IN EACH NOSTRIL DAILY 16 g 5  . glimepiride (AMARYL) 2 MG tablet 2 tabs in the AM and 1 tab in the PM 270 tablet 3  . levothyroxine (SYNTHROID, LEVOTHROID) 100 MCG tablet Take 1 tablet (100 mcg total) by mouth every morning. 90 tablet 3  . loperamide (ANTI-DIARRHEAL) 2 MG tablet TAKE 1 TO 2 TABLETS BY MOUTH TWICE DAILY AS NEEDED FOR DIARRHEA OR LOOSE STOOLS 360 tablet 3  . loratadine (CLARITIN) 10 MG tablet Take 10 mg by mouth daily.     . metFORMIN (GLUCOPHAGE) 1000 MG tablet Take 2 tabs per day. 180 tablet 3  . ONETOUCH ULTRA test strip USE TO TEST BLOOD SUGAR DAILY 100 strip 2  . vitamin B-12 (CYANOCOBALAMIN) 250 MCG tablet Take 1 tablet (250 mcg total) by mouth daily.     No current facility-administered medications on file prior to visit.    Allergies  Allergen Reactions  . Ibuprofen Swelling    Made his joints swell with high dose   Family History  Problem Relation Age of Onset  . Uterine cancer Mother   . Hypertension Mother   . Stroke Mother   . Colon cancer Mother   . Colon polyps Mother   . Cancer Mother        colon, endometrial  . Diabetes Father   . Obesity Father   . Pneumonia Father   . Cancer Father        skin - squamous   . Diabetes Paternal Grandmother   . Prostate cancer Neg Hx  PE: BP (!) 160/60   Pulse 87   Ht _0  (1.778 m)   Wt 234 lb (106.1 kg)   SpO2 98%   BMI 33.58 kg/m  Wt Readings from Last 3 Encounters:  06/17/19 234  lb (106.1 kg)  10/07/18 241 lb 7 oz (109.5 kg)  06/16/18 244 lb 4.8 oz (110.8 kg)   Constitutional: overweight, in NAD Eyes: PERRLA, EOMI, no exophthalmos ENT: moist mucous membranes, no thyromegaly, no cervical lymphadenopathy Cardiovascular: RRR, No MRG Respiratory: CTA B Gastrointestinal: abdomen soft, NT, ND, BS+ Musculoskeletal: no deformities, strength intact in all 4 Skin: moist, warm, no rashes Neurological: no tremor with outstretched hands, DTR normal in all 4  ASSESSMENT: 1. DM2, non-insulin-dependent, uncontrolled, with complications - CKD stage 2  2. H/o Hurthle cell neoplasm  3.  Postsurgical hypothyroidism  4.  Obesity class I  PLAN:  1. Patient with longstanding, uncontrolled, type 2 diabetes, on oral antidiabetic regimen and also GLP-1 receptor agonist.  He returns after long absence.  Latest HbA1c was high, at 9.9%.  Previously, fructosamine levels have been more accurate for him compared to the directly measured HbA1c. - He has no GI side effects from Trulicity dose Jardiance at night to avoid increased urination during the work hours.  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   -At last visit, sugars were lower than before, not very variable, between 130 and 160.  We checked his CBG in the office at that time and it was 134 after a small breakfast.  We did not change his regimen.  The calculated HbA1c from fructosamine was 7.6%. -At this visit, sugars are approximately the same as before, still above target at all times of the day.  He occasionally has sugars in the 200s, but this is very rare, after dietary indiscretions.  He had one lower blood sugar at 99, in the morning, approximately 1.5 months ago, and he is not sure why this happened.  The majority of his sugars are between 150 and 160. -Therefore, I suggested an increase in dose of Trulicity to the newly FDA approved dose of 3 mg weekly.  Otherwise, no change in regimen if needed. - I  suggested to:  Patient Instructions  Please continue: - Metformin 2000 mg with dinner - Glimepiride 4 mg in am and 2 mg in pm - Jardiance 10 mg daily at night  Please increase: - Trulicity to 3 mg weekly  Please continue levothyroxine 100 mcg daily.  Take the thyroid hormone every day, with water, at least 30 minutes before breakfast, separated by at least 4 hours from: - acid reflux medications - calcium - iron - multivitamins  Please return in 3-4 months with your sugar log.  - we checked his HbA1c: 9.2% (lower) - will check a fructosamine also - advised to check sugars at different times of the day - 1-2x a day, rotating check times - advised for yearly eye exams >> he is UTD - UTD with flu shot - return to clinic in 3-4 months    2. H/o Hurthle cell neoplasm -Review latest ultrasound report: No suspicious masses.  I reviewed his left thyroidectomy pathology: No cancer -On the ultrasound from 2013 he had an enlarged lymph node, however, this was not revealed on the last ultrasound from 2009 -He denies neck compression symptoms -No further imaging needed is needed unless he develops neck compression symptoms  3.  Postsurgical hypothyroidism - latest thyroid labs reviewed with  pt >> normal: Lab Results  Component Value Date   TSH 3.01 05/22/2018   - he continues on LT4 100 mcg daily - pt feels good on this dose. - we discussed about taking the thyroid hormone every day, with water, >30 minutes before breakfast, separated by >4 hours from acid reflux medications, calcium, iron, multivitamins. Pt. is taking it correctly. - will check thyroid tests today: TSH and fT4 - If labs are abnormal, he will need to return for repeat TFTs in 1.5 months  4.  Obesity class I -He lost 10 pounds in the last year! -We will increase the dose of Trulicity which should also help with weight loss  Office Visit on 06/17/2019  Component Date Value Ref Range Status  . TSH 06/17/2019 3.33   0.35 - 4.50 uIU/mL Final  . Free T4 06/17/2019 1.09  0.60 - 1.60 ng/dL Final   Comment: Specimens from patients who are undergoing biotin therapy and /or ingesting biotin supplements may contain high levels of biotin.  The higher biotin concentration in these specimens interferes with this Free T4 assay.  Specimens that contain high levels  of biotin may cause false high results for this Free T4 assay.  Please interpret results in light of the total clinical presentation of the patient.    . Fructosamine 06/17/2019 317* 205 - 285 umol/L Final  . Hemoglobin A1C 06/17/2019 9.2* 4.0 - 5.6 % Final   Normal TFTs. HbA1c calculated from fructosamine is 7.0%!  Philemon Kingdom, MD PhD [facility]

## 2019-06-17 NOTE — Telephone Encounter (Signed)
Trulicity is not covered, please advise.

## 2019-06-17 NOTE — Progress Notes (Signed)
  [place] OFFICE PROGRESS NOTE   Diagnosis: Colon cancer, familial polyposis  INTERVAL HISTORY:   Mr. Lewellyn returns as scheduled.  He feels well.  Continues to have 4-6 bowel movements per day.  He is scheduled for a lower endoscopy with Dr. Marcello Moores months.  He will have an upper endoscopy at [facility] next spring.  Good appetite.  He relates weight loss to increased exercise with work.  Objective:  Vital signs in last 24 hours:  Blood pressure 128/80, pulse 93, temperature 98.7 F (37.1 C), temperature source Temporal, resp. rate 18, height 5\' 10"  (1.778 m), weight 235 lb (106.6 kg), SpO2 97 %.   Limited physical examination secondary to distancing with the Covid pandemic Lymphatics: No cervical, supraclavicular, axillary, or inguinal nodes GI: No hepatomegaly, nontender, no mass Vascular: No leg edema    Lab Results:  Lab Results  Component Value Date   WBC 4.9 09/18/2018   HGB 13.2 09/18/2018   HCT 39.5 09/18/2018   MCV 91.9 09/18/2018   PLT 223.0 09/18/2018   NEUTROABS 2.7 09/18/2018    CMP  Lab Results  Component Value Date   NA 135 09/18/2018   K 3.9 09/18/2018   CL 102 09/18/2018   CO2 25 09/18/2018   GLUCOSE 101 (H) 09/18/2018   BUN 17 09/18/2018   CREATININE 1.33 09/18/2018   CALCIUM 9.1 09/18/2018   PROT 6.8 09/18/2018   ALBUMIN 3.8 09/18/2018   AST 20 09/18/2018   ALT 19 09/18/2018   ALKPHOS 68 09/18/2018   BILITOT 0.7 09/18/2018   GFRNONAA 68 (L) 09/22/2012   GFRAA 79 (L) 09/22/2012    Lab Results  Component Value Date   [name]1 1.45 06/16/2018    Medications: I have reviewed the patient's current medications.   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 Ileostomy takedown 09/18/2012.  2. Familial polyposis with multiple adenomatous polyps noted on the colectomy specimen  05/20/2012 -followed [facility] and by Dr. Marcello Moores for surveillance endoscopies.  3. 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  4. Diabetes.  5. Status post endoscopic removal of a duodenal polyp at [facility] on 06/08/2013 and 10/25/2014, removal of adenoma at the minor papilla at [facility] 05/21/2017, duodenal polyps at [facility] 11/26/2017-adenomatous polyps removed near the papilla   Disposition: Cory Harper is in remission from colon cancer.  He would like to continue follow-up at the cancer center.  He will return for an office visit in 1 year.  He continues lower endoscopic surveillance with Dr. Marcello Moores and reports he will be scheduled for an upper endoscopy next April at [facility].  Betsy Coder, MD  06/17/2019  10:43 AM

## 2019-06-17 NOTE — Telephone Encounter (Signed)
Ozempic sent

## 2019-06-17 NOTE — Patient Instructions (Signed)
Please continue: - Metformin 2000 mg with dinner - Glimepiride 4 mg in am and 2 mg in pm - Jardiance 10 mg daily at night  Please increase: - Trulicity to 3 mg weekly  Please continue levothyroxine 100 mcg daily.  Take the thyroid hormone every day, with water, at least 30 minutes before breakfast, separated by at least 4 hours from: - acid reflux medications - calcium - iron - multivitamins  Please return in 3-4 months with your sugar log.

## 2019-06-17 NOTE — Addendum Note (Signed)
Addended by: Cardell Peach I on: 06/17/2019 09:31 AM   Modules accepted: Orders

## 2019-06-17 NOTE — Telephone Encounter (Signed)
She is on Trulicity right now, so I am not quite sure what they mean... Can we try to send Ozempic 1 mg?  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

## 2019-06-21 LAB — FRUCTOSAMINE: Fructosamine: 317 umol/L — ABNORMAL HIGH (ref 205–285)

## 2019-07-07 ENCOUNTER — Other Ambulatory Visit (HOSPITAL_COMMUNITY)
Admission: RE | Admit: 2019-07-07 | Discharge: 2019-07-07 | Disposition: A | Discharge status: Home / Self Care | Payer: BC Managed Care – PPO | Source: Ambulatory Visit | Attending: General Surgery | Admitting: General Surgery

## 2019-07-07 DIAGNOSIS — Z01812 Encounter for preprocedural laboratory examination: Secondary | ICD-10-CM | POA: Diagnosis not present

## 2019-07-07 DIAGNOSIS — Z20828 Contact with and (suspected) exposure to other viral communicable diseases: Secondary | ICD-10-CM | POA: Insufficient documentation

## 2019-07-09 LAB — NOVEL CORONAVIRUS, NAA (HOSP ORDER, SEND-OUT TO REF LAB; TAT 18-24 HRS): SARS-CoV-2, NAA: NOT DETECTED

## 2019-07-10 ENCOUNTER — Encounter (HOSPITAL_COMMUNITY)
Admission: RE | Disposition: A | Discharge status: Home / Self Care | Payer: Self-pay | Source: Home / Self Care | Attending: General Surgery

## 2019-07-10 ENCOUNTER — Encounter (HOSPITAL_COMMUNITY): Payer: Self-pay | Admitting: *Deleted

## 2019-07-10 ENCOUNTER — Other Ambulatory Visit: Payer: Self-pay

## 2019-07-10 ENCOUNTER — Ambulatory Visit (HOSPITAL_COMMUNITY)
Admission: RE | Admit: 2019-07-10 | Discharge: 2019-07-10 | Disposition: A | Discharge status: Home / Self Care | Payer: BC Managed Care – PPO | Attending: General Surgery | Admitting: General Surgery

## 2019-07-10 DIAGNOSIS — Z9049 Acquired absence of other specified parts of digestive tract: Secondary | ICD-10-CM | POA: Insufficient documentation

## 2019-07-10 DIAGNOSIS — E119 Type 2 diabetes mellitus without complications: Secondary | ICD-10-CM | POA: Diagnosis not present

## 2019-07-10 DIAGNOSIS — Z7984 Long term (current) use of oral hypoglycemic drugs: Secondary | ICD-10-CM | POA: Insufficient documentation

## 2019-07-10 DIAGNOSIS — E890 Postprocedural hypothyroidism: Secondary | ICD-10-CM | POA: Diagnosis not present

## 2019-07-10 DIAGNOSIS — Z980 Intestinal bypass and anastomosis status: Secondary | ICD-10-CM | POA: Insufficient documentation

## 2019-07-10 DIAGNOSIS — Z87891 Personal history of nicotine dependence: Secondary | ICD-10-CM | POA: Diagnosis not present

## 2019-07-10 DIAGNOSIS — Z79899 Other long term (current) drug therapy: Secondary | ICD-10-CM | POA: Diagnosis not present

## 2019-07-10 DIAGNOSIS — Z85038 Personal history of other malignant neoplasm of large intestine: Secondary | ICD-10-CM | POA: Diagnosis not present

## 2019-07-10 DIAGNOSIS — I10 Essential (primary) hypertension: Secondary | ICD-10-CM | POA: Insufficient documentation

## 2019-07-10 DIAGNOSIS — Z886 Allergy status to analgesic agent status: Secondary | ICD-10-CM | POA: Insufficient documentation

## 2019-07-10 DIAGNOSIS — Z7982 Long term (current) use of aspirin: Secondary | ICD-10-CM | POA: Diagnosis not present

## 2019-07-10 DIAGNOSIS — Z86010 Personal history of colonic polyps: Secondary | ICD-10-CM | POA: Diagnosis present

## 2019-07-10 DIAGNOSIS — Z79890 Hormone replacement therapy (postmenopausal): Secondary | ICD-10-CM | POA: Diagnosis not present

## 2019-07-10 DIAGNOSIS — E785 Hyperlipidemia, unspecified: Secondary | ICD-10-CM | POA: Diagnosis not present

## 2019-07-10 DIAGNOSIS — M1990 Unspecified osteoarthritis, unspecified site: Secondary | ICD-10-CM | POA: Diagnosis not present

## 2019-07-10 DIAGNOSIS — D1339 Benign neoplasm of other parts of small intestine: Secondary | ICD-10-CM | POA: Insufficient documentation

## 2019-07-10 HISTORY — PX: BIOPSY: SHX5522

## 2019-07-10 HISTORY — PX: POUCHOSCOPY: SHX6321

## 2019-07-10 SURGERY — ENDOSCOPY, POUCH, SMALL INTESTINE, DIAGNOSTIC
Anesthesia: Moderate Sedation

## 2019-07-10 MED ORDER — SODIUM CHLORIDE 0.9% FLUSH: 3.0000 mL | Freq: Two times a day (BID) | INTRAVENOUS | Status: DC

## 2019-07-10 NOTE — Discharge Instructions (Signed)
Post Colonoscopy Instructions ° °1. DIET: Follow a light bland diet the first 24 hours after arrival home, such as soup, liquids, crackers, etc.  Be sure to include lots of fluids daily.  Avoid fast food or heavy meals as your are more likely to get nauseated.   °2. You may have some mild rectal bleeding for the first few days after the procedure.  This should get less and less with time.  Resume any blood thinners 2 days after your procedure unless directed otherwise by your physician. °3. Take your usually prescribed home medications unless otherwise directed. °a. If you have any pain, it is helpful to get up and walk around, as it is usually from excess gas. °b. If this is not helpful, you can take an over-the-counter pain medication.  Choose one of the following that works best for you: °i. Naproxen (Aleve, etc)  Two 220mg tabs twice a day °ii. Ibuprofen (Advil, etc) Three 200mg tabs four times a day (every meal & bedtime) °iii. If you still have pain after using one of these, please call the office °4. It is normal to not have a bowel movement for 2-3 days after colonoscopy.   ° °5. ACTIVITIES as tolerated:   °6. You may resume regular (light) daily activities beginning the next day--such as daily self-care, walking, climbing stairs--gradually increasing activities as tolerated.  ° ° °WHEN TO CALL US [phone]: °1. Fever over 101.5 F (38.5 C)  °2. Severe abdominal or chest pain  °3. Large amount of rectal bleeding, passing multiple blood clots  °4. Dizziness or shortness of breath °5. Increasing nausea or vomiting ° ° The clinic staff is available to answer your questions during regular business hours (8:30am-5pm).  Please don’t hesitate to call and ask to speak to one of our nurses for clinical concerns.  ° If you have a medical emergency, go to the nearest emergency room or call 911. ° A surgeon from [facility] is always on call at the hospitals ° ° °[facility] °[address], [address], [place], [place]  [address] ? °MAIN: [phone] ? TOLL FREE: [phone] ?  °FAX [phone] °[email or web address] ° ° °

## 2019-07-10 NOTE — H&P (Signed)
Cory Harper is an 63 y.o. male.   Chief Complaint: surveillance HPI: here for surveillance pouchoscopy.  H/o FAP  Past Medical History:  Diagnosis Date  . Anemia   . Arthritis   . Cancer ([place])    stg II colon cancer s/p resection  . Colonic mass   . Diabetes mellitus   . FAP (familial adenomatous polyposis)   . Heart murmur   . Hyperlipidemia   . Hypertension    not on blood pressure meds since 11/13   . Hypothyroidism   . Nasal congestion     Past Surgical History:  Procedure Laterality Date  . BIOPSY  01/15/2018   Procedure: BIOPSY;  Surgeon: Leighton Ruff, MD;  Location: [facility];  Service: Endoscopy;;  . BOWEL RESECTION  05/20/2012  . COLON RESECTION  05/20/2012   Procedure: COLON RESECTION LAPAROSCOPIC;  Surgeon: Adin Hector, MD;  Location: [facility];  Service: General;  Laterality: N/A;  Laparoscopic Proctocolectomy, Ileal Pouch Anal Anastomoisis, Loop Ileostomy  . ESOPHAGOGASTRODUODENOSCOPY (EGD) WITH PROPOFOL N/A 04/23/2013   Procedure: ESOPHAGOGASTRODUODENOSCOPY (EGD) WITH PROPOFOL;  Surgeon: Milus Banister, MD;  Location: [facility];  Service: Endoscopy;  Laterality: N/A;  side viewing scope  . FLEXIBLE SIGMOIDOSCOPY  08/01/2012   Procedure: FLEXIBLE SIGMOIDOSCOPY;  Surgeon: Leighton Ruff, MD;  Location: [facility];  Service: Endoscopy;  Laterality: N/A;  use endoscope  . FLEXIBLE SIGMOIDOSCOPY N/A 11/29/2014   Procedure: FLEXIBLE SIGMOIDOSCOPY/POUCHOSCOPY;  Surgeon: Leighton Ruff, MD;  Location: [facility];  Service: Endoscopy;  Laterality: N/A;  . ILEOSTOMY  05/20/12  . ILEOSTOMY CLOSURE N/A 09/18/2012   Procedure: Loop Ileostomy Takedown with EUA ;  Surgeon: Adin Hector, MD;  Location: [facility];  Service: General;  Laterality: N/A;  loop ileostomy takedown with EUA   . KNEE ARTHROSCOPY  1998   Right knee  . POUCHOSCOPY  11/16/2013   Procedure: POUCHOSCOPY;  Surgeon: Leighton Ruff, MD;  Location: [facility];  Service: Endoscopy;;  . POUCHOSCOPY N/A  11/16/2015   Procedure: POUCHOSCOPY;  Surgeon: Leighton Ruff, MD;  Location: [facility];  Service: Endoscopy;  Laterality: N/A;  . POUCHOSCOPY N/A 12/20/2016   Procedure: POUCHOSCOPY;  Surgeon: Leighton Ruff, MD;  Location: [facility];  Service: Endoscopy;  Laterality: N/A;  . POUCHOSCOPY N/A 01/15/2018   Procedure: POUCHOSCOPY;  Surgeon: Leighton Ruff, MD;  Location: [facility];  Service: Endoscopy;  Laterality: N/A;  . THYROID LOBECTOMY  05/20/2012   Procedure: THYROID LOBECTOMY;  Surgeon: Adin Hector, MD;  Location: [facility];  Service: General;  Laterality: Left;  LEFT THYROID LOBECTOMY  . UPPER GASTROINTESTINAL ENDOSCOPY    . WISDOM TOOTH EXTRACTION      Family History  Problem Relation Age of Onset  . Uterine cancer Mother   . Hypertension Mother   . Stroke Mother   . Colon cancer Mother   . Colon polyps Mother   . Cancer Mother        colon, endometrial  . Diabetes Father   . Obesity Father   . Pneumonia Father   . Cancer Father        skin - squamous   . Diabetes Paternal Grandmother   . Prostate cancer Neg Hx    Social History:  reports that he quit smoking about 27 years ago. He has never used smokeless tobacco. He reports that he does not drink alcohol or use drugs.  Allergies:  Allergies  Allergen Reactions  . Ibuprofen Swelling    Made his joints swell with high  dose    Medications Prior to Admission  Medication Sig Dispense Refill  . aspirin 81 MG tablet Take 81 mg by mouth daily.    Marland Kitchen atorvastatin (LIPITOR) 20 MG tablet Take 1 tablet (20 mg total) by mouth daily. (Patient taking differently: Take 20 mg by mouth every evening. ) 90 tablet 3  . Blood Glucose Monitoring Suppl (ONE TOUCH ULTRA SYSTEM KIT) W/DEVICE KIT Dispense 1 kit with 100 lancets and strips, with 3 rf on lancets and strips.  Check sugar daily.  Dx E11.9 1 each 0  . cholecalciferol (VITAMIN D) 1000 UNITS tablet Take 1,000 Units by mouth daily.     . empagliflozin (JARDIANCE) 10 MG TABS  tablet Take 10 mg by mouth daily. 90 tablet 3  . ferrous sulfate 325 (65 FE) MG tablet Take 1 tablet (325 mg total) by mouth 2 (two) times daily. 180 tablet 3  . fluticasone (FLONASE) 50 MCG/ACT nasal spray USE 2 SPRAYS IN EACH NOSTRIL DAILY (Patient taking differently: Place 2 sprays into both nostrils daily as needed for allergies. ) 16 g 5  . glimepiride (AMARYL) 2 MG tablet 2 tabs in the AM and 1 tab in the PM (Patient taking differently: Take 2-4 mg by mouth See admin instructions. Take 2 tablet (4 mg) by mouth in the morning & take 1 tablet (2 mg) by mouth at night.) 270 tablet 3  . levothyroxine (SYNTHROID, LEVOTHROID) 100 MCG tablet Take 1 tablet (100 mcg total) by mouth every morning. (Patient taking differently: Take 100 mcg by mouth daily before breakfast. ) 90 tablet 3  . loperamide (ANTI-DIARRHEAL) 2 MG tablet TAKE 1 TO 2 TABLETS BY MOUTH TWICE DAILY AS NEEDED FOR DIARRHEA OR LOOSE STOOLS (Patient taking differently: Take 2 mg by mouth 2 (two) times daily. ) 360 tablet 3  . loratadine (CLARITIN) 10 MG tablet Take 10 mg by mouth daily.     . metFORMIN (GLUCOPHAGE) 1000 MG tablet Take 2 tabs per day. (Patient taking differently: Take 2,000 mg by mouth daily with supper. Take 2 tabs per day.) 180 tablet 3  . Semaglutide, 1 MG/DOSE, (OZEMPIC, 1 MG/DOSE,) 2 MG/1.5ML SOPN Inject 1 mg into the skin once a week. (Patient taking differently: Inject 1 mg into the skin every Saturday. ) 1 pen 2  . vitamin B-12 (CYANOCOBALAMIN) 250 MCG tablet Take 1 tablet (250 mcg total) by mouth daily.    Glory Rosebush ULTRA test strip USE TO TEST BLOOD SUGAR DAILY 100 strip 2    No results found for this or any previous visit (from the past 48 hour(s)). No results found.  Review of Systems  Constitutional: Negative for chills, fever and weight loss.  HENT: Negative for hearing loss and tinnitus.   Eyes: Negative for blurred vision and double vision.  Respiratory: Negative for cough and shortness of breath.    Cardiovascular: Negative for chest pain and palpitations.  Gastrointestinal: Negative for abdominal pain, nausea and vomiting.  Genitourinary: Negative for dysuria, frequency and urgency.  Musculoskeletal: Negative for myalgias.  Skin: Negative for itching and rash.  Neurological: Negative for dizziness and headaches.    There were no vitals taken for this visit. Physical Exam  Constitutional: He is oriented to person, place, and time. He appears well-developed and well-nourished. No distress.  HENT:  Head: Normocephalic and atraumatic.  Eyes: Pupils are equal, round, and reactive to light. Conjunctivae and EOM are normal.  Neck: Normal range of motion. Neck supple.  Cardiovascular: Normal rate and regular rhythm.  Respiratory: Effort normal. No respiratory distress.  GI: Soft. He exhibits no distension. There is no abdominal tenderness.  Musculoskeletal: Normal range of motion.  Neurological: He is alert and oriented to person, place, and time.  Skin: Skin is warm and dry.     Assessment/Plan Will plan for pouchoscopy with possible biopsy.  Pt does not wish to be sedated.  Risks are bleeding, pain, perforation.  Pt agrees to proceed.  Rosario Adie, MD 34/[id number]/3709, 7:22 AM

## 2019-07-10 NOTE — Op Note (Signed)
[facility] Patient Name: Cory Harper Procedure Date: 07/10/2019 MRN: [id number] Attending MD: Leighton Ruff , MD Date of Birth: 1956/03/08 CSN: [id number] Age: 63 Admit Type: Inpatient Procedure:                Pouchoscopy Indications:              History of total proctocolectomy, History of                            familial polyposis Providers:                Leighton Ruff, MD, Cleda Daub, RN, Lazaro Arms,                            Technician Referring MD:              Medicines:                None Complications:            No immediate complications. Estimated blood loss:                            Minimal. Estimated Blood Loss:     Estimated blood loss was minimal. Procedure:                Pre-Anesthesia Assessment:                           - Prior to the procedure, a History and Physical                            was performed, and patient medications and                            allergies were reviewed. The patient's tolerance of                            previous anesthesia was also reviewed. The risks                            and benefits of the procedure and the sedation                            options and risks were discussed with the patient.                            All questions were answered, and informed consent                            was obtained. Prior Anticoagulants: The patient has                            taken no previous anticoagulant or antiplatelet                            agents. ASA Grade Assessment: II - A patient with  mild systemic disease. After reviewing the risks                            and benefits, the patient was deemed in                            satisfactory condition to undergo the procedure.                           After obtaining informed consent, the endoscope was                            passed under direct vision. Throughout the                             procedure, the patient's blood pressure, pulse, and                            oxygen saturations were monitored continuously. The                            [id number]) Olympus pediatric colonscope                            was introduced through the ileoanal anastomosis via                            the anus and advanced to the ileoanal pouch. The                            procedure was performed with ease. The patient                            tolerated the procedure fairly well. Scope In: 7:30:13 AM Scope Out: 7:40:53 AM Total Procedure Duration: 0 hours 10 minutes 40 seconds  Findings:      There was an ileal pouch 2 cm from the anal verge. This was       characterized by healthy appearing mucosa. The pouch capacity was large       in size.      The ileoanal pouch contained a few pedunculated, non-bleeding polyps.       The polyps were 2 mm in diameter. Representative biopsies were taken       with a cold forceps for histology. Impression:               - Ileal pouch with healthy appearing mucosa seen.                           - A few polyps in the ileoanal pouch. Biopsied. Recommendation:           - Repeat post-surgical lower GI endoscopy in 1 year                            for surveillance. Procedure Code(s):        --- Professional ---  44386, Endoscopic evaluation of small intestinal                            pouch (eg, Kock pouch, ileal reservoir [S or J]);                            with biopsy, single or multiple Diagnosis Code(s):        --- Professional ---                           Z98.0, Intestinal bypass and anastomosis status                           D13.39, Benign neoplasm of other parts of small                            intestine                           Z90.49, Acquired absence of other specified parts                            of digestive tract                           Z86.010, Personal history of colonic  polyps CPT copyright 2019 [employer]. All rights reserved. The codes documented in this report are preliminary and upon coder review may  be revised to meet current compliance requirements. Leighton Ruff, MD Leighton Ruff, MD [id number] 7:48:39 AM This report has been signed electronically. Number of Addenda: 0

## 2019-07-13 LAB — SURGICAL PATHOLOGY

## 2019-08-18 ENCOUNTER — Telehealth: Payer: Self-pay | Admitting: Family Medicine

## 2019-08-18 NOTE — Telephone Encounter (Signed)
Patient called because he has not been seen since march 2020 for his physical and thought it was time to follow up with you  Advised patient we could go ahead and schedule the physical to see you since he is due in march anyway. He stated he would rather have a message sent to see what you advised. Does he need to be seen before march?

## 2019-08-19 NOTE — Telephone Encounter (Signed)
March is reasonable for yearly visit.  Thanks.

## 2019-08-19 NOTE — Telephone Encounter (Signed)
Can you please call patient and schedule appointment as instructed?

## 2019-08-19 NOTE — Telephone Encounter (Signed)
Left message for patient to call back  Need to schedule CPE and labs Prior

## 2019-09-12 ENCOUNTER — Other Ambulatory Visit: Payer: Self-pay | Admitting: Internal Medicine

## 2019-09-13 ENCOUNTER — Other Ambulatory Visit: Payer: Self-pay | Admitting: Family Medicine

## 2019-09-13 DIAGNOSIS — E1122 Type 2 diabetes mellitus with diabetic chronic kidney disease: Secondary | ICD-10-CM

## 2019-09-13 DIAGNOSIS — E538 Deficiency of other specified B group vitamins: Secondary | ICD-10-CM

## 2019-09-13 DIAGNOSIS — Z8639 Personal history of other endocrine, nutritional and metabolic disease: Secondary | ICD-10-CM

## 2019-09-13 DIAGNOSIS — Z862 Personal history of diseases of the blood and blood-forming organs and certain disorders involving the immune mechanism: Secondary | ICD-10-CM

## 2019-09-23 ENCOUNTER — Telehealth: Payer: Self-pay

## 2019-09-23 NOTE — Telephone Encounter (Signed)
LVM to call clinic, pt needs COVID screen 2.17.2021 TLJ

## 2019-09-25 ENCOUNTER — Other Ambulatory Visit: Payer: BC Managed Care – PPO

## 2019-10-02 ENCOUNTER — Encounter: Payer: BC Managed Care – PPO | Admitting: Family Medicine

## 2019-10-05 ENCOUNTER — Other Ambulatory Visit: Payer: Self-pay | Admitting: *Deleted

## 2019-10-05 MED ORDER — FERROUS SULFATE 325 (65 FE) MG PO TABS: 325.0000 mg | ORAL_TABLET | Freq: Two times a day (BID) | ORAL | 3 refills | Status: DC

## 2019-10-08 ENCOUNTER — Other Ambulatory Visit: Payer: Self-pay | Admitting: *Deleted

## 2019-10-08 MED ORDER — GLIMEPIRIDE 2 MG PO TABS: ORAL_TABLET | ORAL | 3 refills | Status: DC

## 2019-10-15 ENCOUNTER — Ambulatory Visit: Payer: BC Managed Care – PPO | Admitting: Internal Medicine

## 2019-10-15 ENCOUNTER — Other Ambulatory Visit: Payer: Self-pay | Admitting: *Deleted

## 2019-10-15 MED ORDER — LEVOTHYROXINE SODIUM 100 MCG PO TABS: 100.0000 ug | ORAL_TABLET | Freq: Every morning | ORAL | 3 refills | Status: DC

## 2019-10-19 ENCOUNTER — Other Ambulatory Visit (INDEPENDENT_AMBULATORY_CARE_PROVIDER_SITE_OTHER): Payer: BC Managed Care – PPO

## 2019-10-19 ENCOUNTER — Other Ambulatory Visit: Payer: Self-pay | Admitting: *Deleted

## 2019-10-19 ENCOUNTER — Other Ambulatory Visit: Payer: BC Managed Care – PPO

## 2019-10-19 ENCOUNTER — Other Ambulatory Visit: Payer: Self-pay

## 2019-10-19 DIAGNOSIS — Z862 Personal history of diseases of the blood and blood-forming organs and certain disorders involving the immune mechanism: Secondary | ICD-10-CM | POA: Diagnosis not present

## 2019-10-19 DIAGNOSIS — N182 Chronic kidney disease, stage 2 (mild): Secondary | ICD-10-CM | POA: Diagnosis not present

## 2019-10-19 DIAGNOSIS — E538 Deficiency of other specified B group vitamins: Secondary | ICD-10-CM

## 2019-10-19 DIAGNOSIS — E1122 Type 2 diabetes mellitus with diabetic chronic kidney disease: Secondary | ICD-10-CM | POA: Diagnosis not present

## 2019-10-19 LAB — CBC WITH DIFFERENTIAL/PLATELET
Basophils Absolute: 0 10*3/uL (ref 0.0–0.1)
Basophils Relative: 0.9 % (ref 0.0–3.0)
Eosinophils Absolute: 0.2 10*3/uL (ref 0.0–0.7)
Eosinophils Relative: 3.4 % (ref 0.0–5.0)
HCT: 38.4 % — ABNORMAL LOW (ref 39.0–52.0)
Hemoglobin: 13 g/dL (ref 13.0–17.0)
Lymphocytes Relative: 30.3 % (ref 12.0–46.0)
Lymphs Abs: 1.6 10*3/uL (ref 0.7–4.0)
MCHC: 33.7 g/dL (ref 30.0–36.0)
MCV: 92.2 fl (ref 78.0–100.0)
Monocytes Absolute: 0.3 10*3/uL (ref 0.1–1.0)
Monocytes Relative: 6.5 % (ref 3.0–12.0)
Neutro Abs: 3.1 10*3/uL (ref 1.4–7.7)
Neutrophils Relative %: 58.9 % (ref 43.0–77.0)
Platelets: 209 10*3/uL (ref 150.0–400.0)
RBC: 4.17 Mil/uL — ABNORMAL LOW (ref 4.22–5.81)
RDW: 14.5 % (ref 11.5–15.5)
WBC: 5.3 10*3/uL (ref 4.0–10.5)

## 2019-10-19 LAB — IBC PANEL
Iron: 79 ug/dL (ref 42–165)
Saturation Ratios: 23.3 % (ref 20.0–50.0)
Transferrin: 242 mg/dL (ref 212.0–360.0)

## 2019-10-19 LAB — COMPREHENSIVE METABOLIC PANEL
ALT: 11 U/L (ref 0–53)
AST: 12 U/L (ref 0–37)
Albumin: 3.6 g/dL (ref 3.5–5.2)
Alkaline Phosphatase: 70 U/L (ref 39–117)
BUN: 17 mg/dL (ref 6–23)
CO2: 28 mEq/L (ref 19–32)
Calcium: 9.2 mg/dL (ref 8.4–10.5)
Chloride: 100 mEq/L (ref 96–112)
Creatinine, Ser: 1.14 mg/dL (ref 0.40–1.50)
GFR: 65.04 mL/min (ref >60.00)
Glucose, Bld: 111 mg/dL — ABNORMAL HIGH (ref 70–99)
Potassium: 4.3 mEq/L (ref 3.5–5.1)
Sodium: 134 mEq/L — ABNORMAL LOW (ref 135–145)
Total Bilirubin: 0.7 mg/dL (ref 0.2–1.2)
Total Protein: 6.5 g/dL (ref 6.0–8.3)

## 2019-10-19 LAB — LIPID PANEL
Cholesterol: 122 mg/dL (ref 0–200)
HDL: 38.7 mg/dL — ABNORMAL LOW (ref >39.00)
NonHDL: 83.6
Total CHOL/HDL Ratio: 3
Triglycerides: 226 mg/dL — ABNORMAL HIGH (ref 0.0–149.0)
VLDL: 45.2 mg/dL — ABNORMAL HIGH (ref 0.0–40.0)

## 2019-10-19 LAB — VITAMIN B12: Vitamin B-12: 595 pg/mL (ref 211–911)

## 2019-10-19 LAB — HEMOGLOBIN A1C: Hgb A1c MFr Bld: 9.4 % — ABNORMAL HIGH (ref 4.6–6.5)

## 2019-10-19 LAB — LDL CHOLESTEROL, DIRECT: Direct LDL: 52 mg/dL

## 2019-10-19 MED ORDER — LOPERAMIDE HCL 2 MG PO TABS: ORAL_TABLET | ORAL | 3 refills | Status: DC

## 2019-10-26 ENCOUNTER — Other Ambulatory Visit: Payer: Self-pay

## 2019-10-26 ENCOUNTER — Other Ambulatory Visit: Payer: Self-pay | Admitting: *Deleted

## 2019-10-26 ENCOUNTER — Ambulatory Visit (INDEPENDENT_AMBULATORY_CARE_PROVIDER_SITE_OTHER): Payer: BC Managed Care – PPO | Admitting: Family Medicine

## 2019-10-26 ENCOUNTER — Encounter: Payer: Self-pay | Admitting: Family Medicine

## 2019-10-26 VITALS — BP 122/66 | HR 76 | Temp 97.3°F | Ht 70.0 in | Wt 236.3 lb

## 2019-10-26 DIAGNOSIS — C180 Malignant neoplasm of cecum: Secondary | ICD-10-CM

## 2019-10-26 DIAGNOSIS — D126 Benign neoplasm of colon, unspecified: Secondary | ICD-10-CM

## 2019-10-26 DIAGNOSIS — Z0000 Encounter for general adult medical examination without abnormal findings: Secondary | ICD-10-CM | POA: Diagnosis not present

## 2019-10-26 DIAGNOSIS — E7800 Pure hypercholesterolemia, unspecified: Secondary | ICD-10-CM

## 2019-10-26 DIAGNOSIS — E1122 Type 2 diabetes mellitus with diabetic chronic kidney disease: Secondary | ICD-10-CM

## 2019-10-26 DIAGNOSIS — Z7189 Other specified counseling: Secondary | ICD-10-CM

## 2019-10-26 DIAGNOSIS — D1391 Familial adenomatous polyposis: Secondary | ICD-10-CM

## 2019-10-26 DIAGNOSIS — E890 Postprocedural hypothyroidism: Secondary | ICD-10-CM

## 2019-10-26 MED ORDER — JARDIANCE 10 MG PO TABS: 10.0000 mg | ORAL_TABLET | Freq: Every day | ORAL | 3 refills | Status: DC

## 2019-10-26 MED ORDER — METFORMIN HCL 1000 MG PO TABS: ORAL_TABLET | ORAL | 3 refills | Status: DC

## 2019-10-26 MED ORDER — ATORVASTATIN CALCIUM 20 MG PO TABS: 20.0000 mg | ORAL_TABLET | Freq: Every day | ORAL | 3 refills | Status: DC

## 2019-10-26 NOTE — Progress Notes (Signed)
This visit occurred during the SARS-CoV-2 public health emergency.  Safety protocols were in place, including screening questions prior to the visit, additional usage of staff PPE, and extensive cleaning of exam room while observing appropriate contact time as indicated for disinfecting solutions.  CPE- See plan.  Routine anticipatory guidance given to patient.  See health maintenance.  The possibility exists that previously documented standard health maintenance information may have been brought forward from a previous encounter into this note.  If needed, that same information has been updated to reflect the current situation based on today's encounter.    covid vaccine d/w pt.   Tetanus 2020 Flu done 2020 PNA 2014 Shingles prev done.  J pouch eval by Dr. Marcello Moores 2020 Prostate cancer screening and PSA options(with potential risks and benefits of testing vs not testing) were discussed along with recent recs/guidelines. He declined testing PSAat this point. Living will d/w pt. Wife designated if patient were incapacitated.  Diet and exercise d/w pt.  HIV and HCV prev checked 2017.  Hypothyroidism.  Still on replacement.  No ADE on med.  Compliant.  Discussed with patient about getting TSH with next set of labs, d/w pt. I will update endocrine clinic.  Appreciate endocrine input.  Job related changes with covid- d/w pt.  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  D/w pt.  I thanked him for his effort.   Diabetes:  Using medications without difficulties: yes, still on Jardiance glimepiride Metformin and Ozempic Hypoglycemic episodes:no Hyperglycemic episodes:no Feet problems: no Blood Sugars averaging: improved recently with diet changes and med as is.  Sugar has been 90-140s in the AM.  He is trying to get back to biking for exercise.   eye exam within last year:due this July, d/w pt.     Elevated Cholesterol: Using medications without problems:yes Muscle aches: no Diet compliance:yes Exercise:yes  He had J pouch f/u.  No blood in stool.  No diarrhea.  No FCNAV.    PMH and SH reviewed  Meds, vitals, and allergies reviewed.   ROS: Per HPI.  Unless specifically indicated otherwise in HPI, the patient denies:  General: fever. Eyes: acute vision changes ENT: sore throat Cardiovascular: chest pain Respiratory: SOB GI: vomiting GU: dysuria Musculoskeletal: acute back pain Derm: acute rash Neuro: acute motor dysfunction Psych: worsening mood Endocrine: polydipsia Heme: bleeding Allergy: hayfever  GEN: nad, alert and oriented HEENT: mucous membranes moist NECK: supple w/o LA CV: rrr. PULM: ctab, no inc wob ABD: soft, +bs EXT: no edema SKIN: no acute rash  Diabetic foot exam: Normal inspection No skin breakdown No calluses  Normal DP pulses Normal sensation to light touch and monofilament Nails normal

## 2019-10-26 NOTE — Patient Instructions (Addendum)
Recheck labs in about 3 months prior to a visit.  Take care.  Glad to see you. Thanks for your effort.

## 2019-10-28 NOTE — Assessment & Plan Note (Signed)
Status post J-pouch follow-up with no blood in stool.  I will defer.  He agrees.

## 2019-10-28 NOTE — Assessment & Plan Note (Signed)
I will ask endocrine clinic about checking TSH with next set of labs.

## 2019-10-28 NOTE — Assessment & Plan Note (Signed)
Living will d/w pt.  Wife designated if patient were incapacitated.   ?

## 2019-10-28 NOTE — Assessment & Plan Note (Signed)
Continue Lipitor.  Continue work on diet and exercise.  Labs discussed with patient.  He agrees.

## 2019-10-28 NOTE — Assessment & Plan Note (Signed)
A1c elevated but sugar improved recently on home check with more work on diet and exercise. Blood Sugars averaging: improved recently with diet changes and med as is.  Sugar has been 90-140s in the AM.  He is trying to get back to biking for exercise.   He has follow-up with endocrine I will update endocrine clinic.  Appreciate the help of all involved.

## 2019-10-28 NOTE — Assessment & Plan Note (Signed)
covid vaccine d/w pt.   Tetanus 2020 Flu done 2020 PNA 2014 Shingles prev done.  J pouch eval by Dr. Marcello Moores 2020 Prostate cancer screening and PSA options(with potential risks and benefits of testing vs not testing) were discussed along with recent recs/guidelines. He declined testing PSAat this point. Living will d/w pt. Wife designated if patient were incapacitated.  Diet and exercise d/w pt.  HIV and HCV prev checked 2017.

## 2019-10-28 NOTE — Assessment & Plan Note (Signed)
He has routine follow-up.  I will defer.  He agrees.

## 2019-11-04 ENCOUNTER — Telehealth: Payer: Self-pay | Admitting: *Deleted

## 2019-11-04 NOTE — Telephone Encounter (Signed)
Patient called to let Dr. Damita Dunnings know that he got his first covid vaccine today and is scheduled to get the second on 4/28//21. Patient stated that he got the [facility].  Documented.EPIC.

## 2019-11-04 NOTE — Telephone Encounter (Signed)
Glad to get the update, glad he got vaccinated.  Thanks.

## 2019-11-16 ENCOUNTER — Other Ambulatory Visit: Payer: Self-pay

## 2019-11-18 ENCOUNTER — Ambulatory Visit (INDEPENDENT_AMBULATORY_CARE_PROVIDER_SITE_OTHER): Payer: BC Managed Care – PPO | Admitting: Internal Medicine

## 2019-11-18 ENCOUNTER — Other Ambulatory Visit: Payer: Self-pay

## 2019-11-18 ENCOUNTER — Encounter: Payer: Self-pay | Admitting: Internal Medicine

## 2019-11-18 VITALS — BP 160/70 | HR 60 | Ht 70.0 in | Wt 235.0 lb

## 2019-11-18 DIAGNOSIS — E669 Obesity, unspecified: Secondary | ICD-10-CM | POA: Diagnosis not present

## 2019-11-18 DIAGNOSIS — E1122 Type 2 diabetes mellitus with diabetic chronic kidney disease: Secondary | ICD-10-CM | POA: Diagnosis not present

## 2019-11-18 DIAGNOSIS — E890 Postprocedural hypothyroidism: Secondary | ICD-10-CM

## 2019-11-18 DIAGNOSIS — D34 Benign neoplasm of thyroid gland: Secondary | ICD-10-CM

## 2019-11-18 DIAGNOSIS — N182 Chronic kidney disease, stage 2 (mild): Secondary | ICD-10-CM

## 2019-11-18 NOTE — Patient Instructions (Addendum)
Please continue: - Metformin 2000 mg with dinner - Glimepiride 4 mg in am and 2 mg in pm - Jardiance 10 mg daily at night - Ozempic 1 mg weekly  Please continue levothyroxine 100 mcg daily.  Take the thyroid hormone every day, with water, at least 30 minutes before breakfast, separated by at least 4 hours from: - acid reflux medications - calcium - iron - multivitamins  Please return in 4 months with your sugar log.

## 2019-11-18 NOTE — Progress Notes (Addendum)
Patient ID: Cory Harper, male   DOB: Dec 20, 1955, 64 y.o.   MRN: [id number]   This visit occurred during the SARS-CoV-2 public health emergency.  Safety protocols were in place, including screening questions prior to the visit, additional usage of staff PPE, and extensive cleaning of exam room while observing appropriate contact time as indicated for disinfecting solutions.   HPI: Cory Harper is a 63 y.o.-year-old male, returning for follow-up for DM2, dx in 1998, non-insulin-dependent, uncontrolled, with long term complications (+ mild CKD). Last visit 5 months ago.  Reviewed his HbA1c levels: Lab Results  Component Value Date   HGBA1C 9.4 (H) 10/19/2019   HGBA1C 9.2 (A) 06/17/2019   HGBA1C 9.9 (H) 09/18/2018  06/17/2019: HbA1c calculated from fructosamine is 7.0%! 05/22/2018: HbA1c calculated from the fructosamine is much better than the measured one, at 7.6%.  He is on: - Metformin 1000 mg 2x a day with meals >> 2000 mg with dinner - Amaryl 4 mg in am and 2 mg in pm - Jardiance 10 mg daily at night to avoid daytime urinary frequency -started 03/2017 - Januvia 100 mg daily in am >> Trulicity 1.5 >> 3 mg weekly-started 07/2017. Now Ozempic 1 mg per insurance preference.  No GI side effects Tried Actos >> signif. Weight gain On Cinnamon.  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  Pt checks his sugars 1-2 times a day: - am: 161-180 >> 78, 122-175 >> 143-160 >> 99, 147-168, 170 >> 89-151, 160 - 2h after b'fast: n/c >> 147 >> n/c - before lunch: 107-155, 164 >> 130-154 >> 150-161, 212 >> 156-160 - 2h after lunch: n/c >> 171, 178 >> n/c - before dinner: n/c >> 132-178 >> 123 >> n/c >> 151, 160-164 >> n/c - 2h after dinner: n/c >> 163, 180 >> 130 >> n/c - bedtime: n/c >> 160-180 >> 150-170 >> 150-165 >> 151-166 >> 160 - nighttime: n/c >> 180 Lowest sugar was 107 >>  130 >> 99 >> 89; it is unclear at which level he has hypoglycemia awareness. Highest sugar  was 175 >> 165 >> 212 >> 161.  Glucometer: One Touch Ultra mini  Pt's meals are: - Breakfast: bowl of grits, [place] bacon before starts work,  - Lunch: [place] or ham+cheese wrap or tuna salad - Dinner: baked chicken or pork chop, green beans - Snacks: crackers  Works: 5:45-9 am, 2 -5 pm, with few exceptions.  -+ Mild CKD, last BUN/creatinine:  Lab Results  Component Value Date   BUN 17 10/19/2019   BUN 17 09/18/2018   CREATININE 1.14 10/19/2019   CREATININE 1.33 09/18/2018   Lab Results  Component Value Date   GFRNONAA 68 (L) 09/22/2012   + HL; last set of lipids: Lab Results  Component Value Date   CHOL 122 10/19/2019   HDL 38.70 (L) 10/19/2019   LDLCALC 37 09/18/2018   LDLDIRECT 52.0 10/19/2019   TRIG 226.0 (H) 10/19/2019   CHOLHDL 3 10/19/2019  On Lipitor 20. On ASA 81. - last eye exam was in 02/2019: No DR -No numbness and tingling in his feet.    Pt has no FH of DM.  He has familial polyposis and has a history of colon cancer >> has a J-pouch - he have a pouchoscopy in 07/2019.  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 Post left thyroid lobectomy without evidence of locally residual or locally recurrent disease. 2. Normal appearance of the remaining thyroid parenchyma.  Pt denies: - feeling nodules in neck - hoarseness - dysphagia - choking - SOB with lying down  Postsurgical hypothyroidism:  Pt is on levothyroxine 100 mcg daily, taken: - in am - fasting - at least 30 min from b'fast - no Ca, Fe, MVI, PPIs - not on Biotin  Reviewed his latest TFTs and they were normal: Lab Results  Component Value Date   TSH 3.33 06/17/2019   TSH 3.01 05/22/2018   TSH 3.46 07/25/2017   TSH 2.29 06/27/2016   TSH 1.65 06/14/2015   TSH 1.63 03/11/2014   TSH 2.22 01/19/2013   TSH 2.49 08/19/2012   TSH 5.577  (H) 06/23/2012   TSH 2.86 04/09/2012    ROS: Constitutional: no weight gain/no weight loss, no fatigue, no subjective hyperthermia, no subjective hypothermia Eyes: no blurry vision, no xerophthalmia ENT: no sore throat, + see HPI Cardiovascular: no CP/no SOB/no palpitations/no leg swelling Respiratory: no cough/no SOB/no wheezing Gastrointestinal: no N/no V/no D/no C/no acid reflux Musculoskeletal: no muscle aches/no joint aches Skin: no rashes, no hair loss Neurological: no tremors/no numbness/no tingling/no dizziness  I reviewed pt's medications, allergies, PMH, social hx, family hx, and changes were documented in the history of present illness. Otherwise, unchanged from my initial visit note.  Past Medical History:  Diagnosis Date  . Anemia   . Arthritis   . Cancer ([place])    stg II colon cancer s/p resection  . Colonic mass   . Diabetes mellitus   . FAP (familial adenomatous polyposis)   . Heart murmur   . Hyperlipidemia   . Hypertension    not on blood pressure meds since 11/13   . Hypothyroidism   . Nasal congestion    Past Surgical History:  Procedure Laterality Date  . BIOPSY  01/15/2018   Procedure: BIOPSY;  Surgeon: Leighton Ruff, MD;  Location: [facility];  Service: Endoscopy;;  . BIOPSY  07/10/2019   Procedure: BIOPSY;  Surgeon: Leighton Ruff, MD;  Location: [facility];  Service: General;;  . BOWEL RESECTION  05/20/2012  . COLON RESECTION  05/20/2012   Procedure: COLON RESECTION LAPAROSCOPIC;  Surgeon: Adin Hector, MD;  Location: [facility];  Service: General;  Laterality: N/A;  Laparoscopic Proctocolectomy, Ileal Pouch Anal Anastomoisis, Loop Ileostomy  . ESOPHAGOGASTRODUODENOSCOPY (EGD) WITH PROPOFOL N/A 04/23/2013   Procedure: ESOPHAGOGASTRODUODENOSCOPY (EGD) WITH PROPOFOL;  Surgeon: Milus Banister, MD;  Location: [facility];  Service: Endoscopy;  Laterality: N/A;  side viewing scope  . FLEXIBLE SIGMOIDOSCOPY  08/01/2012   Procedure: FLEXIBLE  SIGMOIDOSCOPY;  Surgeon: Leighton Ruff, MD;  Location: [facility];  Service: Endoscopy;  Laterality: N/A;  use endoscope  . FLEXIBLE SIGMOIDOSCOPY N/A 11/29/2014   Procedure: FLEXIBLE SIGMOIDOSCOPY/POUCHOSCOPY;  Surgeon: Leighton Ruff, MD;  Location: [facility];  Service: Endoscopy;  Laterality: N/A;  . ILEOSTOMY  05/20/12  . ILEOSTOMY CLOSURE N/A 09/18/2012   Procedure: Loop Ileostomy Takedown with EUA ;  Surgeon: Adin Hector, MD;  Location: [facility];  Service: General;  Laterality: N/A;  loop ileostomy takedown with EUA   . KNEE ARTHROSCOPY  1998   Right knee  . POUCHOSCOPY  11/16/2013   Procedure: POUCHOSCOPY;  Surgeon: Leighton Ruff, MD;  Location: [facility];  Service: Endoscopy;;  . POUCHOSCOPY N/A 11/16/2015   Procedure: POUCHOSCOPY;  Surgeon: Leighton Ruff, MD;  Location: [facility];  Service: Endoscopy;  Laterality:  N/A;  . POUCHOSCOPY N/A 12/20/2016   Procedure: POUCHOSCOPY;  Surgeon: Leighton Ruff, MD;  Location: [facility];  Service: Endoscopy;  Laterality: N/A;  . POUCHOSCOPY N/A 01/15/2018   Procedure: POUCHOSCOPY;  Surgeon: Leighton Ruff, MD;  Location: [facility];  Service: Endoscopy;  Laterality: N/A;  . POUCHOSCOPY N/A 07/10/2019   Procedure: POUCHOSCOPY;  Surgeon: Leighton Ruff, MD;  Location: [facility];  Service: General;  Laterality: N/A;  . THYROID LOBECTOMY  05/20/2012   Procedure: THYROID LOBECTOMY;  Surgeon: Adin Hector, MD;  Location: [facility];  Service: General;  Laterality: Left;  LEFT THYROID LOBECTOMY  . UPPER GASTROINTESTINAL ENDOSCOPY    . WISDOM TOOTH EXTRACTION     Social History   Socioeconomic History  . Marital status: Married    Spouse name: June  . Number of children: 0  Social Needs  Occupational History  . 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 Smoking status: Former Smoker    Last attempt to  quit: 08/07/1991    Years since quitting: 25.9  . Smokeless tobacco: Never Used  . Tobacco comment: quit in 1993  Substance and Sexual Activity  . Alcohol use: No  . Drug use: No  . Sexual activity: Not on file  Other Topics Concern  . 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 aspirin 81 MG tablet Take 81 mg by mouth daily.    Marland Kitchen atorvastatin (LIPITOR) 20 MG tablet Take 1 tablet (20 mg total) by mouth daily. 90 tablet 3  . Blood Glucose Monitoring Suppl (ONE TOUCH ULTRA SYSTEM KIT) W/DEVICE KIT Dispense 1 kit with 100 lancets and strips, with 3 rf on lancets and strips.  Check sugar daily.  Dx E11.9 1 each 0  . cholecalciferol (VITAMIN D) 1000 UNITS tablet Take 1,000 Units by mouth daily.     . empagliflozin (JARDIANCE) 10 MG TABS tablet Take 10 mg by mouth daily. 90 tablet 3  . ferrous sulfate 325 (65 FE) MG tablet Take 1 tablet (325 mg total) by mouth 2 (two) times daily. 180 tablet 3  . fluticasone (FLONASE) 50 MCG/ACT nasal spray USE 2 SPRAYS IN EACH NOSTRIL DAILY 16 g 5  . glimepiride (AMARYL) 2 MG tablet 2 tabs in the AM and 1 tab in the PM 270 tablet 3  . levothyroxine (SYNTHROID) 100 MCG tablet Take 1 tablet (100 mcg total) by mouth every morning. 90 tablet 3  . loperamide (ANTI-DIARRHEAL) 2 MG tablet TAKE 1 TO 2 TABLETS BY MOUTH TWICE DAILY AS NEEDED FOR DIARRHEA OR LOOSE STOOLS 360 tablet 3  . loratadine (CLARITIN) 10 MG tablet Take 10 mg by mouth daily.     . metFORMIN (GLUCOPHAGE) 1000 MG tablet Take 2 tabs per day. 180 tablet 3  . ONETOUCH ULTRA test strip USE TO TEST BLOOD SUGAR DAILY 100 strip 2  . OZEMPIC, 1 MG/DOSE, 2 MG/1.5ML SOPN INJECT 1 MG INTO THE SKIN ONCE A WEEK 3 mL 2  . vitamin B-12 (CYANOCOBALAMIN) 250 MCG tablet Take 1 tablet (250 mcg total) by mouth daily.     No current  facility-administered medications on file prior to  visit.   Allergies  Allergen Reactions  . Ibuprofen Swelling    Made his joints swell with high dose   Family History  Problem Relation Age of Onset  . Uterine cancer Mother   . Hypertension Mother   . Stroke Mother   . Colon cancer Mother   . Colon polyps Mother   . Cancer Mother        colon, endometrial  . Diabetes Father   . Obesity Father   . Pneumonia Father   . Cancer Father        skin - squamous   . Diabetes Paternal Grandmother   . Prostate cancer Neg Hx     PE: BP (!) 160/70   Pulse 60   Ht 5' 10"  (1.778 m)   Wt 235 lb (106.6 kg)   SpO2 95%   BMI 33.72 kg/m  Wt Readings from Last 3 Encounters:  11/18/19 235 lb (106.6 kg)  10/26/19 236 lb 5 oz (107.2 kg)  06/17/19 235 lb (106.6 kg)   Constitutional: overweight, in NAD Eyes: PERRLA, EOMI, no exophthalmos ENT: moist mucous membranes, no thyromegaly, no neck masses palpated, no cervical lymphadenopathy Cardiovascular: RRR, No MRG Respiratory: CTA B Gastrointestinal: abdomen soft, NT, ND, BS+ Musculoskeletal: no deformities, strength intact in all 4 Skin: moist, warm, no rashes Neurological: no tremor with outstretched hands, DTR normal in all 4  ASSESSMENT: 1. DM2, non-insulin-dependent, uncontrolled, with complications - CKD stage 2  2. H/o Hurthle cell neoplasm  3.  Postsurgical hypothyroidism  4.  Obesity class I  PLAN:  1. Patient with longstanding, uncontrolled, type 2 diabetes, on oral antidiabetic regimen and also GLP-1 agonist.  She is latest directly measured HbA1c was above target, at 9.1%, but these are less accurate for him and HbA1c levels calculated from thousand.  At last visit, HbA1c calculated from fructosamine was 7.0%. -At last visit, sugars were quite stable, between 130 and 160, still slightly above target at all times of the day.  She occasionally had sugars in the 200s, but rare, after dietary indiscretions.  We increased his  Trulicity dose at that time.  Now on Ozempic 1 mg weekly per insurance preference.  He tolerates this well. -At this visit, sugars are better in the morning with only occasional spikes at above 140 now.  However, later in the day, sugars are still higher than target, usually between 150 and 160.  These may not be checked fasting, though. - I suggested to:  Patient Instructions  Please continue: - Metformin 2000 mg with dinner - Glimepiride 4 mg in am and 2 mg in pm - Jardiance 10 mg daily at night - Ozempic 1 mg weekly  Please continue levothyroxine 100 mcg daily.  Take the thyroid hormone every day, with water, at least 30 minutes before breakfast, separated by at least 4 hours from: - acid reflux medications - calcium - iron - multivitamins  Please return in 4 months with your sugar log.  - Today we will check a fructosamine level since these are more accurate for him that they directly measured HbA1c - advised to check sugars at different times of the day - 2x a day, rotating check times - advised for yearly eye exams >> he is UTD - return to clinic in 3-4 months    2. H/o Hurthle cell neoplasm -Her latest ultrasound report there are no suspicious masses in his lower neck.  Reviewed left thyroid pathology: No cancer -On the ultrasound from 2013 he had  an enlarged lymph node, however, this was not revealed on the last ultrasound from 2009 -He denies neck compression symptoms -No further imaging needed unless he develops neck compression symptoms  3.  Postsurgical hypothyroidism - latest thyroid labs reviewed with pt >> normal: Lab Results  Component Value Date   TSH 3.33 06/17/2019   - he continues on LT4 100 mcg daily - pt feels good on this dose. - we discussed about taking the thyroid hormone every day, with water, >30 minutes before breakfast, separated by >4 hours from acid reflux medications, calcium, iron, multivitamins. Pt. is taking it correctly.  4.  Obesity class  I -he lost 10 pounds in the year prior to last visit, but weight stable since then -Continues Ozempic and Jardiance which should both help with weight loss  Office Visit on 11/18/2019  Component Date Value Ref Range Status  . Fructosamine 11/18/2019 349* 205 - 285 umol/L Final   11/18/2019: HbA1c calculated from fructosamine is 7.5%.  Philemon Kingdom, MD PhD [facility]

## 2019-11-20 LAB — FRUCTOSAMINE: Fructosamine: 349 umol/L — ABNORMAL HIGH (ref 205–285)

## 2019-12-07 ENCOUNTER — Other Ambulatory Visit: Payer: Self-pay | Admitting: Internal Medicine

## 2019-12-10 ENCOUNTER — Other Ambulatory Visit: Payer: Self-pay | Admitting: *Deleted

## 2020-01-26 ENCOUNTER — Other Ambulatory Visit: Payer: BC Managed Care – PPO

## 2020-01-26 ENCOUNTER — Other Ambulatory Visit (INDEPENDENT_AMBULATORY_CARE_PROVIDER_SITE_OTHER): Payer: BC Managed Care – PPO

## 2020-01-26 DIAGNOSIS — E1122 Type 2 diabetes mellitus with diabetic chronic kidney disease: Secondary | ICD-10-CM

## 2020-01-26 DIAGNOSIS — N182 Chronic kidney disease, stage 2 (mild): Secondary | ICD-10-CM

## 2020-01-26 LAB — HEMOGLOBIN A1C: Hgb A1c MFr Bld: 10 % — ABNORMAL HIGH (ref 4.6–6.5)

## 2020-01-26 LAB — TSH: TSH: 4.12 u[IU]/mL (ref 0.35–4.50)

## 2020-01-29 ENCOUNTER — Encounter: Payer: Self-pay | Admitting: Family Medicine

## 2020-01-29 ENCOUNTER — Ambulatory Visit: Payer: BC Managed Care – PPO | Admitting: Family Medicine

## 2020-01-29 ENCOUNTER — Other Ambulatory Visit: Payer: Self-pay

## 2020-01-29 DIAGNOSIS — E890 Postprocedural hypothyroidism: Secondary | ICD-10-CM

## 2020-01-29 DIAGNOSIS — E1122 Type 2 diabetes mellitus with diabetic chronic kidney disease: Secondary | ICD-10-CM

## 2020-01-29 DIAGNOSIS — N182 Chronic kidney disease, stage 2 (mild): Secondary | ICD-10-CM

## 2020-01-29 DIAGNOSIS — Z85038 Personal history of other malignant neoplasm of large intestine: Secondary | ICD-10-CM

## 2020-01-29 NOTE — Patient Instructions (Signed)
Don't change your meds yet.  Fill out the grid with pairs of sugars and let me see that.  We'll go from there.  Keep working on diet and get back on the exercise bike when possible.  Take care.  Glad to see you.

## 2020-01-29 NOTE — Progress Notes (Signed)
This visit occurred during the SARS-CoV-2 public health emergency.  Safety protocols were in place, including screening questions prior to the visit, additional usage of staff PPE, and extensive cleaning of exam room while observing appropriate contact time as indicated for disinfecting solutions.  Diabetes:  Using medications without difficulties: yes Hypoglycemic episodes:no Hyperglycemic episodes:no Feet problems: no Blood Sugars averaging: 90-100s recently in the AM, up to 160s occ in the AM.   A1c 10.  D/w pt at OV.   He hasn't been on his exercise bike as much as he would like, d/w pt.   eye exam within last year: f/u pending.   A1c up at 10.    He has f/u pending with GI.  D/w pt.  I will await consult note.    TSH wnl, d/w pt.  128mc levothyroxine daily.  Compliant.  No ADE on med.    He has is covid vaccine.  D/w pt.   Meds, vitals, and allergies reviewed.  ROS: Per HPI unless specifically indicated in ROS section   GEN: nad, alert and oriented HEENT: ncat NECK: supple w/o LA CV: rrr. PULM: ctab, no inc wob ABD: soft, +bs EXT: no edema SKIN: no acute rash  Diabetic foot exam: Normal inspection No skin breakdown No calluses  Normal DP pulses Normal sensation to light touch and monofilament Bilateral first nails thickened but otherwise normal

## 2020-02-01 NOTE — Assessment & Plan Note (Signed)
Current Outpatient Medications on File Prior to Visit  Medication Sig Dispense Refill  . aspirin 81 MG tablet Take 81 mg by mouth daily.    Marland Kitchen atorvastatin (LIPITOR) 20 MG tablet Take 1 tablet (20 mg total) by mouth daily. 90 tablet 3  . Blood Glucose Monitoring Suppl (ONE TOUCH ULTRA SYSTEM KIT) W/DEVICE KIT Dispense 1 kit with 100 lancets and strips, with 3 rf on lancets and strips.  Check sugar daily.  Dx E11.9 1 each 0  . cholecalciferol (VITAMIN D) 1000 UNITS tablet Take 1,000 Units by mouth daily.     . empagliflozin (JARDIANCE) 10 MG TABS tablet Take 10 mg by mouth daily. 90 tablet 3  . ferrous sulfate 325 (65 FE) MG tablet Take 1 tablet (325 mg total) by mouth 2 (two) times daily. 180 tablet 3  . fluticasone (FLONASE) 50 MCG/ACT nasal spray USE 2 SPRAYS IN EACH NOSTRIL DAILY 16 g 5  . glimepiride (AMARYL) 2 MG tablet 2 tabs in the AM and 1 tab in the PM 270 tablet 3  . levothyroxine (SYNTHROID) 100 MCG tablet Take 1 tablet (100 mcg total) by mouth every morning. 90 tablet 3  . loperamide (ANTI-DIARRHEAL) 2 MG tablet TAKE 1 TO 2 TABLETS BY MOUTH TWICE DAILY AS NEEDED FOR DIARRHEA OR LOOSE STOOLS 360 tablet 3  . loratadine (CLARITIN) 10 MG tablet Take 10 mg by mouth daily.     . metFORMIN (GLUCOPHAGE) 1000 MG tablet Take 2 tabs per day. 180 tablet 3  . ONETOUCH ULTRA test strip USE TO TEST BLOOD SUGAR DAILY 100 strip 2  . OZEMPIC, 1 MG/DOSE, 2 MG/1.5ML SOPN INJECT 1 MG INTO THE SKIN ONCE A WEEK 6 pen 2  . vitamin B-12 (CYANOCOBALAMIN) 250 MCG tablet Take 1 tablet (250 mcg total) by mouth daily.     No current facility-administered medications on file prior to visit.    No change in meds at this point. A1c up at 10.   I need more data on his sugars.  I want him to check pre and post meal sugars over the next few days and then report back to me with that.  We can set follow-up at that point.  I will update endocrinology in the meantime.

## 2020-02-01 NOTE — Assessment & Plan Note (Signed)
He has f/u pending with GI.  D/w pt.  I will await consult note.

## 2020-02-01 NOTE — Assessment & Plan Note (Signed)
TSH wnl, d/w pt.  160mc levothyroxine daily.  Compliant.  No ADE on med.

## 2020-02-02 ENCOUNTER — Ambulatory Visit: Payer: BC Managed Care – PPO | Admitting: Family Medicine

## 2020-02-04 ENCOUNTER — Other Ambulatory Visit: Payer: Self-pay | Admitting: Family Medicine

## 2020-02-22 ENCOUNTER — Telehealth: Payer: Self-pay | Admitting: Family Medicine

## 2020-02-22 NOTE — Telephone Encounter (Signed)
Patient advised and states he has starting riding his bike and cutting out his snacks.

## 2020-02-22 NOTE — Telephone Encounter (Signed)
Left message on patient's voicemail to return call

## 2020-02-22 NOTE — Telephone Encounter (Signed)
Please check with patient.  I saw his list of sugars that he dropped off at the office.  I thanked him for his effort.  Those sugars are a lot lower than I would have expected, based on his most recent A1c.  If the patient has made significant changes with diet and exercise in the meantime to get his sugar lower, then I appreciate his effort and I would continue as is.  If he has not made significant changes but he is still getting those readings, then we may need to verify the accuracy of his meter.  Either way, please let me know.  I still want him to keep his follow-up with Dr. Cruzita Lederer  as scheduled.  Thanks.

## 2020-03-18 LAB — HM DIABETES EYE EXAM

## 2020-04-01 ENCOUNTER — Encounter: Payer: Self-pay | Admitting: Internal Medicine

## 2020-04-01 ENCOUNTER — Other Ambulatory Visit: Payer: Self-pay

## 2020-04-01 ENCOUNTER — Ambulatory Visit (INDEPENDENT_AMBULATORY_CARE_PROVIDER_SITE_OTHER): Payer: BC Managed Care – PPO | Admitting: Internal Medicine

## 2020-04-01 VITALS — BP 140/70 | HR 88 | Ht 70.0 in | Wt 233.0 lb

## 2020-04-01 DIAGNOSIS — E890 Postprocedural hypothyroidism: Secondary | ICD-10-CM

## 2020-04-01 DIAGNOSIS — E1122 Type 2 diabetes mellitus with diabetic chronic kidney disease: Secondary | ICD-10-CM | POA: Diagnosis not present

## 2020-04-01 DIAGNOSIS — D34 Benign neoplasm of thyroid gland: Secondary | ICD-10-CM | POA: Diagnosis not present

## 2020-04-01 DIAGNOSIS — N182 Chronic kidney disease, stage 2 (mild): Secondary | ICD-10-CM

## 2020-04-01 DIAGNOSIS — E669 Obesity, unspecified: Secondary | ICD-10-CM

## 2020-04-01 NOTE — Patient Instructions (Signed)
Please continue: - Metformin 2000 mg with dinner - Glimepiride 4 mg in am and 2 mg in pm - Jardiance 10 mg daily at night - Ozempic 1 mg weekly  Please continue levothyroxine 100 mcg daily.  Take the thyroid hormone every day, with water, at least 30 minutes before breakfast, separated by at least 4 hours from: - acid reflux medications - calcium - iron - multivitamins  Please return in 3-4 months with your sugar log.

## 2020-04-01 NOTE — Progress Notes (Signed)
Patient ID: Cory Harper, male   DOB: 05-Mar-1956, 64 y.o.   MRN: [id number]   This visit occurred during the SARS-CoV-2 public health emergency.  Safety protocols were in place, including screening questions prior to the visit, additional usage of staff PPE, and extensive cleaning of exam room while observing appropriate contact time as indicated for disinfecting solutions.    HPI: Cory Harper is a 64 y.o.-year-old male, returning for follow-up for DM2, dx in 1998, non-insulin-dependent, uncontrolled, with long term complications (+ mild CKD). Last visit 4 months ago.  Reviewed his HbA1c levels: Lab Results  Component Value Date   HGBA1C 10.0 (H) 01/26/2020   HGBA1C 9.4 (H) 10/19/2019   HGBA1C 9.2 (A) 06/17/2019  11/18/2019: HbA1c calculated from fructosamine is 7.5%. 06/17/2019: HbA1c calculated from fructosamine is 7.0%! 05/22/2018: HbA1c calculated from the fructosamine is much better than the measured one, at 7.6%.  He is on: - Metformin 1000 mg 2x a day with meals >>  2000 mg with dinner - Amaryl 4 mg in am and 2 mg in pm - Jardiance 10 mg daily at night to avoid daytime urinary frequency-started 03/2017 - Januvia 100 mg daily in am >> Trulicity 1.5 >> 3 mg weekly-started 07/2017.  Now Ozempic 1 mg weekly per insurance preference.  No GI side effects Tried Actos >> signif. Weight gain On Cinnamon.  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 No plans for retirement yet.  Pt checks his sugars 1-2 times a day: - am: 99, 147-168, 170 >> 89-151, 160 >> 91-127, 144,  - 2h after b'fast: n/c >> 147 >> n/c - before lunch: 150-161, 212 >> 156-160 >> 130-150 - 2h after lunch: n/c >> 171, 178 >> n/c >> 140, 155 - before dinner:  123 >> n/c >> 151, 160-164 >> n/c  - 2h after dinner: 163, 180 >> 130 >> n/c >> 145, 148 - bedtime: 150-165 >> 151-166 >> 160 >> 140-155 - nighttime: n/c >> 180 >> n/c Lowest sugar was 107 >>  130 >> 99 >> 89; it is unclear at which CBG  level he has hypoglycemia awareness. Highest sugar was 175 >> 165 >> 212 >> 161.  Glucometer: One Touch Ultra mini  Pt's meals are: - Breakfast: bowl of grits, [place] bacon before starts work,  - Lunch: [place] or ham+cheese wrap or tuna salad - Dinner: baked chicken or pork chop, green beans - Snacks: crackers  Works: 5:30-9 am, and 1:30-6:30 PM, with few exceptions.  -+ Mild CKD, last BUN/creatinine:  Lab Results  Component Value Date   BUN 17 10/19/2019   BUN 17 09/18/2018   CREATININE 1.14 10/19/2019   CREATININE 1.33 09/18/2018   Lab Results  Component Value Date   GFRNONAA 68 (L) 09/22/2012   + HL; last set of lipids: Lab Results  Component Value Date   CHOL 122 10/19/2019   HDL 38.70 (L) 10/19/2019   LDLCALC 37 09/18/2018   LDLDIRECT 52.0 10/19/2019   TRIG 226.0 (H) 10/19/2019   CHOLHDL 3 10/19/2019  On Lipitor 20. On ASA 81. - last eye exam was 03/18/2020: No DR -No numbness and tingling in his feet.    Pt has no FH of DM.  He has familial polyposis and has a history of colon cancer >> has a J-pouch - he had a pouchoscopy in 07/2019.  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  Previous thyroid ultrasound report from 02/2018: 1. Post left thyroid lobectomy without evidence of locally residual or locally recurrent disease. 2. Normal appearance of the remaining thyroid parenchyma.  Pt denies: - feeling nodules in neck - hoarseness - dysphagia - choking - SOB with lying down  Postsurgical hypothyroidism:  Pt is on levothyroxine 100 mcg daily, taken: - in am - fasting - at least 30 min from b'fast - no Ca, Fe, MVI, PPIs - not on Biotin  Reviewed his TFTs: Lab Results  Component Value Date   TSH 4.12 01/26/2020   TSH 3.33 06/17/2019   TSH 3.01 05/22/2018   TSH 3.46 07/25/2017   TSH 2.29 06/27/2016   TSH 1.65 06/14/2015   TSH 1.63 03/11/2014    TSH 2.22 01/19/2013   TSH 2.49 08/19/2012   TSH 5.577 (H) 06/23/2012    ROS: Constitutional: no weight gain/no weight loss, no fatigue, no subjective hyperthermia, no subjective hypothermia Eyes: no blurry vision, no xerophthalmia ENT: no sore throat, + see HPI Cardiovascular: no CP/no SOB/no palpitations/no leg swelling Respiratory: no cough/no SOB/no wheezing Gastrointestinal: no N/no V/no D/no C/no acid reflux Musculoskeletal: no muscle aches/no joint aches Skin: no rashes, no hair loss Neurological: no tremors/no numbness/no tingling/no dizziness  I reviewed pt's medications, allergies, PMH, social hx, family hx, and changes were documented in the history of present illness. Otherwise, unchanged from my initial visit note.  Past Medical History:  Diagnosis Date  . Anemia   . Arthritis   . Cancer ([place])    stg II colon cancer s/p resection  . Colonic mass   . Diabetes mellitus   . FAP (familial adenomatous polyposis)   . Heart murmur   . Hyperlipidemia   . Hypertension    not on blood pressure meds since 11/13   . Hypothyroidism   . Nasal congestion    Past Surgical History:  Procedure Laterality Date  . BIOPSY  01/15/2018   Procedure: BIOPSY;  Surgeon: Leighton Ruff, MD;  Location: [facility];  Service: Endoscopy;;  . BIOPSY  07/10/2019   Procedure: BIOPSY;  Surgeon: Leighton Ruff, MD;  Location: [facility];  Service: General;;  . BOWEL RESECTION  05/20/2012  . COLON RESECTION  05/20/2012   Procedure: COLON RESECTION LAPAROSCOPIC;  Surgeon: Adin Hector, MD;  Location: [facility];  Service: General;  Laterality: N/A;  Laparoscopic Proctocolectomy, Ileal Pouch Anal Anastomoisis, Loop Ileostomy  . ESOPHAGOGASTRODUODENOSCOPY (EGD) WITH PROPOFOL N/A 04/23/2013   Procedure: ESOPHAGOGASTRODUODENOSCOPY (EGD) WITH PROPOFOL;  Surgeon: Milus Banister, MD;  [facility];  Service: Endoscopy;  Laterality: N/A;  side viewing scope  . FLEXIBLE SIGMOIDOSCOPY  08/01/2012    Procedure: FLEXIBLE SIGMOIDOSCOPY;  Surgeon: Leighton Ruff, MD;  [facility];  Service: Endoscopy;  Laterality: N/A;  use endoscope  . FLEXIBLE SIGMOIDOSCOPY N/A 11/29/2014   Procedure: FLEXIBLE SIGMOIDOSCOPY/POUCHOSCOPY;  Surgeon: Leighton Ruff, MD;  [facility];  Service: Endoscopy;  Laterality: N/A;  . ILEOSTOMY  05/20/12  . ILEOSTOMY CLOSURE N/A 09/18/2012   Procedure: Loop Ileostomy Takedown with EUA ;  Surgeon: Adin Hector, MD;  [facility];  Service: General;  Laterality: N/A;  loop ileostomy takedown with EUA   . KNEE ARTHROSCOPY  1998   Right knee  . POUCHOSCOPY  11/16/2013   Procedure: POUCHOSCOPY;  Surgeon: Leighton Ruff, MD;  Location: [facility];  Service: Endoscopy;;  . POUCHOSCOPY N/A 11/16/2015   Procedure: POUCHOSCOPY;  Surgeon: Leighton Ruff, MD;  [facility];  Service: Endoscopy;  Laterality: N/A;  . POUCHOSCOPY N/A 12/20/2016   Procedure: POUCHOSCOPY;  Surgeon: Leighton Ruff, MD;  [facility];  Service: Endoscopy;  Laterality: N/A;  . POUCHOSCOPY N/A 01/15/2018   Procedure: POUCHOSCOPY;  Surgeon: Leighton Ruff, MD;  [facility];  Service: Endoscopy;  Laterality: N/A;  . POUCHOSCOPY N/A 07/10/2019   Procedure: POUCHOSCOPY;  Surgeon: Leighton Ruff, MD;  Location: [facility];  Service: General;  Laterality: N/A;  . THYROID LOBECTOMY  05/20/2012   Procedure: THYROID LOBECTOMY;  Surgeon: Adin Hector, MD;  Location: [facility];  Service: General;  Laterality: Left;  LEFT THYROID LOBECTOMY  . UPPER GASTROINTESTINAL ENDOSCOPY    . WISDOM TOOTH EXTRACTION     Social History   Socioeconomic History  . Marital status: Married    Spouse name: June  . Number of children: 0  Social Needs  Occupational History  . 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 Smoking status: Former Smoker     Last attempt to quit: 08/07/1991    Years since quitting: 25.9  . Smokeless tobacco: Never Used  . Tobacco comment: quit in 1993  Substance and Sexual Activity  . Alcohol use: No  . Drug use: No  . Sexual activity: Not on file  Other Topics Concern  . 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 aspirin 81 MG tablet Take 81 mg by mouth daily.    Marland Kitchen atorvastatin (LIPITOR) 20 MG tablet Take 1 tablet (20 mg total) by mouth daily. 90 tablet 3  . Blood Glucose Monitoring Suppl (ONE TOUCH ULTRA SYSTEM KIT) W/DEVICE KIT Dispense 1 kit with 100 lancets and strips, with 3 rf on lancets and strips.  Check sugar daily.  Dx E11.9 1 each 0  . cholecalciferol (VITAMIN D) 1000 UNITS tablet Take 1,000 Units by mouth daily.     . empagliflozin (JARDIANCE) 10 MG TABS tablet Take 10 mg by mouth daily. 90 tablet 3  . ferrous sulfate 325 (65 FE) MG tablet Take 1 tablet (325 mg total) by mouth 2 (two) times daily. 180 tablet 3  . fluticasone (FLONASE) 50 MCG/ACT nasal spray USE 2 SPRAYS IN EACH NOSTRIL DAILY 16 g 5  . glimepiride (AMARYL) 2 MG tablet 2 tabs in the AM and 1 tab in the PM 270 tablet 3  . levothyroxine (SYNTHROID) 100 MCG tablet Take 1 tablet (100 mcg total) by mouth every morning. 90 tablet 3  . loperamide (ANTI-DIARRHEAL) 2 MG tablet TAKE 1 TO 2 TABLETS BY MOUTH TWICE DAILY AS NEEDED FOR DIARRHEA OR LOOSE STOOLS 360 tablet 3  . loratadine (CLARITIN) 10 MG tablet Take 10 mg by mouth daily.     . metFORMIN (GLUCOPHAGE) 1000 MG tablet Take 2 tabs per day. 180 tablet 3  . ONETOUCH ULTRA test strip USE TO TEST BLOOD SUGAR DAILY 100 strip 2  . OZEMPIC, 1 MG/DOSE, 2 MG/1.5ML SOPN INJECT 1 MG INTO THE SKIN ONCE A WEEK 6 pen 2  . vitamin B-12 (CYANOCOBALAMIN) 250 MCG tablet Take 1 tablet (250 mcg total) by mouth daily.     No  current facility-administered medications  on file prior to visit.   Allergies  Allergen Reactions  . Ibuprofen Swelling    Made his joints swell with high dose   Family History  Problem Relation Age of Onset  . Uterine cancer Mother   . Hypertension Mother   . Stroke Mother   . Colon cancer Mother   . Colon polyps Mother   . Cancer Mother        colon, endometrial  . Diabetes Father   . Obesity Father   . Pneumonia Father   . Cancer Father        skin - squamous   . Diabetes Paternal Grandmother   . Prostate cancer Neg Hx     PE: BP 140/70   Pulse 88   Ht 5' 10"  (1.778 m)   Wt 233 lb (105.7 kg)   SpO2 98%   BMI 33.43 kg/m  Wt Readings from Last 3 Encounters:  04/01/20 233 lb (105.7 kg)  01/29/20 235 lb 5 oz (106.7 kg)  11/18/19 235 lb (106.6 kg)   Constitutional: overweight, in NAD Eyes: PERRLA, EOMI, no exophthalmos ENT: moist mucous membranes, no thyromegaly, no cervical lymphadenopathy Cardiovascular: RRR, No MRG, + R>L LE swelling (pitting) Respiratory: CTA B Gastrointestinal: abdomen soft, NT, ND, BS+ Musculoskeletal: no deformities, strength intact in all 4 Skin: moist, warm, no rashes Neurological: no tremor with outstretched hands, DTR normal in all 4  ASSESSMENT: 1. DM2, non-insulin-dependent, uncontrolled, with complications - CKD stage 2  2. H/o Hurthle cell neoplasm  3.  Postsurgical hypothyroidism  4.  Obesity class I  PLAN:  1. Patient with longstanding, uncontrolled, type 2 diabetes, on oral antidiabetic regimen with Metformin, SGLT2 inhibitor, and sulfonylurea and also weekly GLP-1 receptor agonist, with still poor control.  At last visit, HbA1c calculated from fructosamine was 7.5% corresponding to directly measured HbA1c of 9.4%.  He had another HbA1c level 2 months ago and this was higher, at 10%.  The sugars he is low, however, are better than expected from the directly measured HbA1c so we are mostly following his diabetes by looking at  his CBGs at home.  At last visit these were quite stable, type II 140s in the morning but slightly higher than target later in the day, usually between 150 and 160 (not all checked fasting, though).  We did not change his regimen at that time. 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  His sugars I am contrast with his most recent HbA1c of 10%.  Of note, his fructosamine levels are usually more accurate than his fingerstick HbA1c levels.  We will continue to follow him by fructosamine and fingersticks.  Due to most of his blood sugars being at goal, we do not need to change his regimen at this visit. - I suggested to:  Patient Instructions  Please continue: - Metformin 2000 mg with dinner - Glimepiride 4 mg in am and 2 mg in pm - Jardiance 10 mg daily at night - Ozempic 1 mg weekly  Please continue levothyroxine 100 mcg daily.  Take the thyroid hormone every day, with water, at least 30 minutes before breakfast, separated by at least 4 hours from: - acid reflux medications - calcium - iron - multivitamins  Please return in 3-4 months with your sugar log.  - we will  check his fructosamine level today  - advised to check sugars at different times of the day - 1-2x a day, rotating check times - advised for yearly eye exams >> he is  UTD - return to clinic in 3 months   2. H/o Hurthle cell neoplasm -Her latest ultrasound report there are no suspicious masses in his lower neck.  Reviewed left thyroid pathology: Benign -On the ultrasound from 2013 he had an enlarged lymph node, however, this was not revealed on the last ultrasound from 2019 -He denies neck compression symptoms now -No further imaging needed unless he develops neck compression symptoms  3.  Postsurgical hypothyroidism - latest thyroid labs reviewed with  pt >> normal: Lab Results  Component Value Date   TSH 4.12 01/26/2020   - he continues on LT4 100 mcg daily - pt feels good on this dose. - we discussed about taking the thyroid hormone every day, with water, >30 minutes before breakfast, separated by >4 hours from acid reflux medications, calcium, iron, multivitamins. Pt. is taking it correctly.  4.  Obesity class I -continue SGLT 2 inhibitor and GLP-1 receptor agonist which should also help with weight loss -He was exercising but he started school in the last 2 weeks and was not able to exercise since then  - lost 2 lbs since last [place] Visit on 04/01/2020  Component Date Value Ref Range Status  . Fructosamine 04/01/2020 331* 205 - 285 umol/L Final  The HbA1c calculated from fructosamine is 7.4%, correlating better with your sugars at home.  Philemon Kingdom, MD PhD [facility] Endocrinology

## 2020-04-05 LAB — FRUCTOSAMINE: Fructosamine: 331 umol/L — ABNORMAL HIGH (ref 205–285)

## 2020-04-23 ENCOUNTER — Other Ambulatory Visit: Payer: Self-pay | Admitting: Internal Medicine

## 2020-04-25 ENCOUNTER — Encounter: Payer: Self-pay | Admitting: Family Medicine

## 2020-04-28 ENCOUNTER — Telehealth: Payer: Self-pay | Admitting: Oncology

## 2020-04-28 NOTE — Telephone Encounter (Signed)
Rescheduled appointment per provider pal schedule. Called patient, no answer. Left message for patient with appointment date and time.

## 2020-06-02 ENCOUNTER — Other Ambulatory Visit: Payer: Self-pay | Admitting: Family Medicine

## 2020-06-13 ENCOUNTER — Telehealth: Payer: Self-pay | Admitting: Oncology

## 2020-06-13 NOTE — Telephone Encounter (Signed)
Called pt per 11/8 sch msg - no answer. Left message for patient to call back if reschedule is still needed

## 2020-06-14 ENCOUNTER — Other Ambulatory Visit: Payer: Self-pay | Admitting: Family Medicine

## 2020-06-14 ENCOUNTER — Telehealth: Payer: Self-pay | Admitting: Family Medicine

## 2020-06-14 DIAGNOSIS — E1122 Type 2 diabetes mellitus with diabetic chronic kidney disease: Secondary | ICD-10-CM

## 2020-06-14 DIAGNOSIS — Z862 Personal history of diseases of the blood and blood-forming organs and certain disorders involving the immune mechanism: Secondary | ICD-10-CM

## 2020-06-14 DIAGNOSIS — N182 Chronic kidney disease, stage 2 (mild): Secondary | ICD-10-CM

## 2020-06-14 DIAGNOSIS — E538 Deficiency of other specified B group vitamins: Secondary | ICD-10-CM

## 2020-06-14 NOTE — Telephone Encounter (Signed)
I put in the orders, if he can get labs ahead of time.  If not, then we can do at the visit.  Ahead would be more useful, if his schedule allows.  Thanks.

## 2020-06-14 NOTE — Telephone Encounter (Signed)
Please advise on message.   Thanks.  Dm/cma

## 2020-06-14 NOTE — Telephone Encounter (Signed)
Pt schedule follow up for 11/22 and he wanted to know if you need labs prior

## 2020-06-14 NOTE — Telephone Encounter (Signed)
lft vm that lab orders were placed so he can get them don prior to appt if he wants.  Dm/cma

## 2020-06-16 ENCOUNTER — Ambulatory Visit: Payer: BC Managed Care – PPO | Admitting: Oncology

## 2020-06-20 ENCOUNTER — Inpatient Hospital Stay: Payer: BC Managed Care – PPO | Admitting: Oncology

## 2020-06-27 ENCOUNTER — Ambulatory Visit: Payer: BC Managed Care – PPO | Admitting: Family Medicine

## 2020-06-27 ENCOUNTER — Encounter: Payer: Self-pay | Admitting: Family Medicine

## 2020-06-27 ENCOUNTER — Other Ambulatory Visit: Payer: Self-pay

## 2020-06-27 VITALS — BP 118/70 | HR 62 | Temp 97.7°F | Ht 71.0 in | Wt 233.9 lb

## 2020-06-27 DIAGNOSIS — N182 Chronic kidney disease, stage 2 (mild): Secondary | ICD-10-CM | POA: Diagnosis not present

## 2020-06-27 DIAGNOSIS — E1122 Type 2 diabetes mellitus with diabetic chronic kidney disease: Secondary | ICD-10-CM | POA: Diagnosis not present

## 2020-06-27 DIAGNOSIS — E538 Deficiency of other specified B group vitamins: Secondary | ICD-10-CM | POA: Diagnosis not present

## 2020-06-27 DIAGNOSIS — E7800 Pure hypercholesterolemia, unspecified: Secondary | ICD-10-CM | POA: Diagnosis not present

## 2020-06-27 DIAGNOSIS — Z862 Personal history of diseases of the blood and blood-forming organs and certain disorders involving the immune mechanism: Secondary | ICD-10-CM

## 2020-06-27 LAB — CBC WITH DIFFERENTIAL/PLATELET
Basophils Absolute: 0 10*3/uL (ref 0.0–0.1)
Basophils Relative: 1 % (ref 0.0–3.0)
Eosinophils Absolute: 0.2 10*3/uL (ref 0.0–0.7)
Eosinophils Relative: 4.2 % (ref 0.0–5.0)
HCT: 41.4 % (ref 39.0–52.0)
Hemoglobin: 13.9 g/dL (ref 13.0–17.0)
Lymphocytes Relative: 31.4 % (ref 12.0–46.0)
Lymphs Abs: 1.4 10*3/uL (ref 0.7–4.0)
MCHC: 33.6 g/dL (ref 30.0–36.0)
MCV: 92.2 fl (ref 78.0–100.0)
Monocytes Absolute: 0.3 10*3/uL (ref 0.1–1.0)
Monocytes Relative: 7.2 % (ref 3.0–12.0)
Neutro Abs: 2.6 10*3/uL (ref 1.4–7.7)
Neutrophils Relative %: 56.2 % (ref 43.0–77.0)
Platelets: 227 10*3/uL (ref 150.0–400.0)
RBC: 4.49 Mil/uL (ref 4.22–5.81)
RDW: 14.2 % (ref 11.5–15.5)
WBC: 4.6 10*3/uL (ref 4.0–10.5)

## 2020-06-27 LAB — COMPREHENSIVE METABOLIC PANEL
ALT: 14 U/L (ref 0–53)
AST: 16 U/L (ref 0–37)
Albumin: 3.8 g/dL (ref 3.5–5.2)
Alkaline Phosphatase: 71 U/L (ref 39–117)
BUN: 19 mg/dL (ref 6–23)
CO2: 27 mEq/L (ref 19–32)
Calcium: 9.3 mg/dL (ref 8.4–10.5)
Chloride: 101 mEq/L (ref 96–112)
Creatinine, Ser: 1.25 mg/dL (ref 0.40–1.50)
GFR: 61.54 mL/min (ref >60.00)
Glucose, Bld: 146 mg/dL — ABNORMAL HIGH (ref 70–99)
Potassium: 4.3 mEq/L (ref 3.5–5.1)
Sodium: 135 mEq/L (ref 135–145)
Total Bilirubin: 0.8 mg/dL (ref 0.2–1.2)
Total Protein: 7 g/dL (ref 6.0–8.3)

## 2020-06-27 LAB — LIPID PANEL
Cholesterol: 112 mg/dL (ref 0–200)
HDL: 43.9 mg/dL (ref >39.00)
LDL Cholesterol: 34 mg/dL (ref 0–99)
NonHDL: 67.74
Total CHOL/HDL Ratio: 3
Triglycerides: 168 mg/dL — ABNORMAL HIGH (ref 0.0–149.0)
VLDL: 33.6 mg/dL (ref 0.0–40.0)

## 2020-06-27 LAB — IBC PANEL
Iron: 114 ug/dL (ref 42–165)
Saturation Ratios: 30.2 % (ref 20.0–50.0)
Transferrin: 270 mg/dL (ref 212.0–360.0)

## 2020-06-27 LAB — HEMOGLOBIN A1C: Hgb A1c MFr Bld: 9.9 % — ABNORMAL HIGH (ref 4.6–6.5)

## 2020-06-27 LAB — VITAMIN B12: Vitamin B-12: 422 pg/mL (ref 211–911)

## 2020-06-27 MED ORDER — BLOOD GLUCOSE MONITORING SUPPL W/DEVICE KIT: 1.0000 | PACK | Freq: Every day | 0 refills | Status: DC

## 2020-06-27 NOTE — Progress Notes (Signed)
This visit occurred during the SARS-CoV-2 public health emergency.  Safety protocols were in place, including screening questions prior to the visit, additional usage of staff PPE, and extensive cleaning of exam room while observing appropriate contact time as indicated for disinfecting solutions.  Diabetes:  Using medications without difficulties: yes Hypoglycemic episodes: no Hyperglycemic episodes: no Feet problems:no Blood Sugars averaging: usually 150 or lower.  115 this AM.   eye exam within last year: yes Labs pending.   He is working on diet and exercise.    Elevated Cholesterol: Using medications without problems:yes Muscle aches: no Diet compliance: Yes Exercise: Yes Labs pending.   Current Outpatient Medications on File Prior to Visit  Medication Sig Dispense Refill  . aspirin 81 MG tablet Take 81 mg by mouth daily.    Marland Kitchen atorvastatin (LIPITOR) 20 MG tablet Take 1 tablet (20 mg total) by mouth daily. 90 tablet 3  . Blood Glucose Monitoring Suppl (ONE TOUCH ULTRA SYSTEM KIT) W/DEVICE KIT Dispense 1 kit with 100 lancets and strips, with 3 rf on lancets and strips.  Check sugar daily.  Dx E11.9 1 each 0  . cholecalciferol (VITAMIN D) 1000 UNITS tablet Take 1,000 Units by mouth daily.     . empagliflozin (JARDIANCE) 10 MG TABS tablet Take 10 mg by mouth daily. 90 tablet 3  . ferrous sulfate 325 (65 FE) MG tablet Take 1 tablet (325 mg total) by mouth 2 (two) times daily. 180 tablet 3  . fluticasone (FLONASE) 50 MCG/ACT nasal spray USE 2 SPRAYS IN EACH NOSTRIL DAILY 16 g 5  . glimepiride (AMARYL) 2 MG tablet 2 tabs in the AM and 1 tab in the PM 270 tablet 3  . levothyroxine (SYNTHROID) 100 MCG tablet Take 1 tablet (100 mcg total) by mouth every morning. 90 tablet 3  . loperamide (ANTI-DIARRHEAL) 2 MG tablet TAKE 1 TO 2 TABLETS BY MOUTH TWICE DAILY AS NEEDED FOR DIARRHEA OR LOOSE STOOLS 360 tablet 3  . loratadine (CLARITIN) 10 MG tablet Take 10 mg by mouth daily.     . metFORMIN  (GLUCOPHAGE) 1000 MG tablet Take 2 tabs per day. 180 tablet 3  . ONETOUCH ULTRA test strip USE TO TEST BLOOD SUGAR DAILY 100 strip 2  . OZEMPIC, 1 MG/DOSE, 4 MG/3ML SOPN INJECT 1 MG INTO THE SKIN ONCE A WEEK 12 mL 1  . vitamin B-12 (CYANOCOBALAMIN) 250 MCG tablet Take 1 tablet (250 mcg total) by mouth daily.     No current facility-administered medications on file prior to visit.   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    Meds, vitals, and allergies reviewed.  ROS: Per HPI unless specifically indicated in ROS section   GEN: nad, alert and oriented HEENT: ncat NECK: supple w/o LA CV: rrr. PULM: ctab, no inc wob ABD: soft, +bs EXT: no edema SKIN: no acute rash  Diabetic foot exam: Normal inspection No skin breakdown No calluses  Normal DP pulses Normal sensation to light touch and monofilament Nails normal

## 2020-06-27 NOTE — Patient Instructions (Addendum)
Go to the lab on the way out.   If you have mychart we'll likely use that to update you.    Don't change your meds for now.  I'll update Dr. Cruzita Lederer.    Plan on recheck in 6 months at a physical.   Take care.  Glad to see you.

## 2020-06-28 ENCOUNTER — Other Ambulatory Visit: Payer: Self-pay

## 2020-06-28 ENCOUNTER — Inpatient Hospital Stay: Payer: BC Managed Care – PPO | Attending: Oncology | Admitting: Oncology

## 2020-06-28 VITALS — BP 129/67 | HR 74 | Temp 97.1°F | Resp 17 | Ht 71.0 in | Wt 235.2 lb

## 2020-06-28 DIAGNOSIS — Z79899 Other long term (current) drug therapy: Secondary | ICD-10-CM | POA: Diagnosis not present

## 2020-06-28 DIAGNOSIS — C180 Malignant neoplasm of cecum: Secondary | ICD-10-CM | POA: Insufficient documentation

## 2020-06-28 DIAGNOSIS — E119 Type 2 diabetes mellitus without complications: Secondary | ICD-10-CM | POA: Diagnosis not present

## 2020-06-28 DIAGNOSIS — Z86018 Personal history of other benign neoplasm: Secondary | ICD-10-CM | POA: Insufficient documentation

## 2020-06-28 LAB — MICROALBUMIN / CREATININE URINE RATIO
Creatinine,U: 86.1 mg/dL
Microalb Creat Ratio: 0.9 mg/g (ref 0.0–30.0)
Microalb, Ur: 0.8 mg/dL (ref 0.0–1.9)

## 2020-06-28 NOTE — Progress Notes (Signed)
  [place] OFFICE PROGRESS NOTE   Diagnosis: Colon cancer  INTERVAL HISTORY:   Cory Harper returns for a scheduled visit.  He feels well.  He continues to have multiple bowel movements per day.  He underwent a pouchoscopy by Dr. Marcello Moores on 07/10/2019.  Biopsies were taken of 2 mm polyps in the ileoanal pouch.  The pathology returned as a tubular adenoma.  [name] continues upper endoscopic surveillance with [facility]. Lysle [facility] at [facility]  Objective:  Vital signs in last 24 hours:  Blood pressure 129/67, pulse 74, temperature (!) 97.1 F (36.2 C), temperature source Tympanic, resp. rate 17, height 5\' 11"  (1.803 m), weight 235 lb 3.2 oz (106.7 kg), SpO2 100 %.    Lymphatics: No cervical, supraclavicular, axillary, or inguinal nodes Resp: Lungs clear bilaterally Cardio: Regular rate and rhythm GI: No hepatosplenomegaly, no mass, nontender Vascular: No leg edema, varicosities of the lower leg bilaterally   Lab Results:  Lab Results  Component Value Date   WBC 4.6 06/27/2020   HGB 13.9 06/27/2020   HCT 41.4 06/27/2020   MCV 92.2 06/27/2020   PLT 227.0 06/27/2020   NEUTROABS 2.6 06/27/2020    CMP  Lab Results  Component Value Date   NA 135 06/27/2020   K 4.3 06/27/2020   CL 101 06/27/2020   CO2 27 06/27/2020   GLUCOSE 146 (H) 06/27/2020   BUN 19 06/27/2020   CREATININE 1.25 06/27/2020   CALCIUM 9.3 06/27/2020   PROT 7.0 06/27/2020   ALBUMIN 3.8 06/27/2020   AST 16 06/27/2020   ALT 14 06/27/2020   ALKPHOS 71 06/27/2020   BILITOT 0.8 06/27/2020   GFRNONAA 68 (L) 09/22/2012   GFRAA 79 (L) 09/22/2012    Lab Results  Component Value Date   [name]1 1.45 06/16/2018   Medications: I have reviewed the patient's current medications.   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 Ileostomy takedown 09/18/2012.   2. Familial polyposis with multiple adenomatous polyps noted on the colectomy specimen 05/20/2012 -followed [facility] and by Dr. Marcello Moores for surveillance endoscopies.  3. 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  4. Diabetes.  5. Status post endoscopic removal of a duodenal polyp at [facility] on 06/08/2013 and 10/25/2014, removal of adenoma at the minor papilla at [facility] 05/21/2017, duodenal polyps at [facility] 11/26/2017-adenomatous polyps removed near the papilla    Disposition: Cory Harper is in clinical remission from colon cancer.  He continues surveillance of the ileoanal pouch by Dr. Marcello Moores.  He continues upper endoscopic surveillance with Dr. Lysle Rubens at [facility].  Cory Harper would like to continue follow-up at the Cancer center.  He will return for an office visit in 1 year.  Betsy Coder, MD  06/28/2020  10:11 AM

## 2020-06-30 LAB — FRUCTOSAMINE: Fructosamine: 325 umol/L — ABNORMAL HIGH (ref 205–285)

## 2020-07-03 NOTE — Assessment & Plan Note (Signed)
No change in meds at this point.  Continue meds as listed above.  See notes on labs.  I will update endocrinology as FYI.  I appreciate the help of all involved.

## 2020-07-03 NOTE — Assessment & Plan Note (Signed)
No change in meds at this point.  Continue atorvastatin.  See notes on labs.  I will update endocrinology as FYI.  I appreciate the help of all involved.

## 2020-08-02 ENCOUNTER — Ambulatory Visit (INDEPENDENT_AMBULATORY_CARE_PROVIDER_SITE_OTHER): Payer: BC Managed Care – PPO | Admitting: Internal Medicine

## 2020-08-02 ENCOUNTER — Encounter: Payer: Self-pay | Admitting: Internal Medicine

## 2020-08-02 ENCOUNTER — Other Ambulatory Visit: Payer: Self-pay

## 2020-08-02 VITALS — BP 130/82 | HR 65 | Ht 71.0 in | Wt 235.0 lb

## 2020-08-02 DIAGNOSIS — E890 Postprocedural hypothyroidism: Secondary | ICD-10-CM

## 2020-08-02 DIAGNOSIS — E1122 Type 2 diabetes mellitus with diabetic chronic kidney disease: Secondary | ICD-10-CM | POA: Diagnosis not present

## 2020-08-02 DIAGNOSIS — N182 Chronic kidney disease, stage 2 (mild): Secondary | ICD-10-CM

## 2020-08-02 DIAGNOSIS — E669 Obesity, unspecified: Secondary | ICD-10-CM | POA: Diagnosis not present

## 2020-08-02 DIAGNOSIS — D34 Benign neoplasm of thyroid gland: Secondary | ICD-10-CM | POA: Diagnosis not present

## 2020-08-02 NOTE — Progress Notes (Signed)
Patient ID: Cory Harper, male   DOB: 1955-08-25, 64 y.o.   MRN: [id number]   This visit occurred during the SARS-CoV-2 public health emergency.  Safety protocols were in place, including screening questions prior to the visit, additional usage of staff PPE, and extensive cleaning of exam room while observing appropriate contact time as indicated for disinfecting solutions.   HPI: Cory Harper is a 64 y.o.-year-old male, returning for follow-up for DM2, dx in 1998, non-insulin-dependent, uncontrolled, with long term complications (+ mild CKD). Last visit 4 months ago.  Reviewed his HbA1c levels: 04/01/2020: HbA1c calculated from fructosamine is 7.4%, correlating better with your sugars at home. Lab Results  Component Value Date   HGBA1C 9.9 (H) 06/27/2020   HGBA1C 10.0 (H) 01/26/2020   HGBA1C 9.4 (H) 10/19/2019  11/18/2019: HbA1c calculated from fructosamine is 7.5%. 06/17/2019: HbA1c calculated from fructosamine is 7.0%! 05/22/2018: HbA1c calculated from the fructosamine is much better than the measured one, at 7.6%.  He is on: - Metformin 1000 mg 2x a day with meals >> 2000 mg with dinner - Amaryl 4 mg in am and 2 mg in pm - Jardiance 10 mg daily at night to avoid daytime urinary frequency-started 03/2017 - Januvia 100 mg daily in am >> Trulicity 1.5 >> 3 mg weekly-started 07/2017.  Now on Ozempic 1 mg weekly per insurance preference.  No GI side effects. Tried Actos >> signif. Weight gain On Cinnamon.  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  No plans for retirement yet.Marland Kitchen  Pt checks his sugars 1-2 times a day: - am: 89-151, 160 >> 91-127, 144 >> 94, 98-140, 150, 160 - 2h after b'fast: n/c >> 147 >> n/c >> 108, 143, 180 - before lunch: 150-161, 212 >> 156-160 >> 130-150 >> 134-160, 180 - 2h after lunch: n/c >> 171, 178 >> n/c >> 140, 155 >> n/c - before dinner:  123 >> n/c >> 151, 160-164 >> n/c  - 2h after dinner: 130 >> n/c >> 145, 148 >> 140, 160 -  bedtime: 150-165 >> 151-166 >> 160 >> 140-155 >> 147-160 - nighttime: n/c >> 180 >> n/c Lowest sugar was 107 >>  130 >> 99 >> 89 >> 94; it is unclear at which CBG level he has hypoglycemia awareness. Highest sugar was 175 >> 165 >> 212 >> 161 >> 180 (Christmas, desserts).  Glucometer: One Touch Ultra mini  Pt's meals are: - Breakfast: bowl of grits, [place] bacon before starts work,  - Lunch: [place] or ham+cheese wrap or tuna salad - Dinner: baked chicken or pork chop, green beans - Snacks: crackers, desserts  Works: 5:30-9 am, and 1:30-6:30 PM, with few exceptions. Started to use a stationary bike in the evening.  -+ Mild CKD, last BUN/creatinine:  Lab Results  Component Value Date   BUN 19 06/27/2020   BUN 17 10/19/2019   CREATININE 1.25 06/27/2020   CREATININE 1.14 10/19/2019   Lab Results  Component Value Date   GFRNONAA 68 (L) 09/22/2012   + HL; last set of lipids: Lab Results  Component Value Date   CHOL 112 06/27/2020   HDL 43.90 06/27/2020   LDLCALC 34 06/27/2020   LDLDIRECT 52.0 10/19/2019   TRIG 168.0 (H) 06/27/2020   CHOLHDL 3 06/27/2020  On Lipitor 20. On ASA 81. - last eye exam was 03/18/2020: No DR -No numbness and tingling in his feet.    Pt has no FH of DM.  He has familial  polyposis and has a history of colon cancer >> has a J-pouch - he had a pouchoscopy in 07/2019.  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  Previous thyroid ultrasound report from 02/2018: 1. Post left thyroid lobectomy without evidence of locally residual or locally recurrent disease. 2. Normal appearance of the remaining thyroid parenchyma.  Pt denies: - feeling nodules in neck - hoarseness - dysphagia - choking - SOB with lying down  Postsurgical hypothyroidism:  Pt is on levothyroxine 100 mcg daily, taken: - in am - fasting - at least 30 min from b'fast - no Ca, Fe,  MVI, PPIs - not on Biotin  Reviewed his TFTs: Lab Results  Component Value Date   TSH 4.12 01/26/2020   TSH 3.33 06/17/2019   TSH 3.01 05/22/2018   TSH 3.46 07/25/2017   TSH 2.29 06/27/2016   TSH 1.65 06/14/2015   TSH 1.63 03/11/2014   TSH 2.22 01/19/2013   TSH 2.49 08/19/2012   TSH 5.577 (H) 06/23/2012    ROS: Constitutional: no weight gain/no weight loss, no fatigue, no subjective hyperthermia, no subjective hypothermia Eyes: no blurry vision, no xerophthalmia ENT: no sore throat, + see HPI Cardiovascular: no CP/no SOB/no palpitations/no leg swelling Respiratory: no cough/no SOB/no wheezing Gastrointestinal: no N/no V/no D/no C/no acid reflux Musculoskeletal: no muscle aches/no joint aches Skin: no rashes, no hair loss Neurological: no tremors/no numbness/no tingling/no dizziness  I reviewed pt's medications, allergies, PMH, social hx, family hx, and changes were documented in the history of present illness. Otherwise, unchanged from my initial visit note.  Past Medical History:  Diagnosis Date  . Anemia   . Arthritis   . Cancer ([place])    stg II colon cancer s/p resection  . Colonic mass   . Diabetes mellitus   . FAP (familial adenomatous polyposis)   . Heart murmur   . Hyperlipidemia   . Hypertension    not on blood pressure meds since 11/13   . Hypothyroidism   . Nasal congestion    Past Surgical History:  Procedure Laterality Date  . BIOPSY  01/15/2018   Procedure: BIOPSY;  Surgeon: Leighton Ruff, MD;  Location: [facility];  Service: Endoscopy;;  . BIOPSY  07/10/2019   Procedure: BIOPSY;  Surgeon: Leighton Ruff, MD;  Location: [facility];  Service: General;;  . BOWEL RESECTION  05/20/2012  . COLON RESECTION  05/20/2012   Procedure: COLON RESECTION LAPAROSCOPIC;  Surgeon: Adin Hector, MD;  Location: [facility];  Service: General;  Laterality: N/A;  Laparoscopic Proctocolectomy, Ileal Pouch Anal Anastomoisis, Loop Ileostomy  . ESOPHAGOGASTRODUODENOSCOPY  (EGD) WITH PROPOFOL N/A 04/23/2013   Procedure: ESOPHAGOGASTRODUODENOSCOPY (EGD) WITH PROPOFOL;  Surgeon: Milus Banister, MD;  Location: [facility];  Service: Endoscopy;  Laterality: N/A;  side viewing scope  . FLEXIBLE SIGMOIDOSCOPY  08/01/2012   Procedure: FLEXIBLE SIGMOIDOSCOPY;  Surgeon: Leighton Ruff, MD;  Location: [facility];  Service: Endoscopy;  Laterality: N/A;  use endoscope  . FLEXIBLE SIGMOIDOSCOPY N/A 11/29/2014   Procedure: FLEXIBLE SIGMOIDOSCOPY/POUCHOSCOPY;  Surgeon: Leighton Ruff, MD;  Location: [facility];  Service: Endoscopy;  Laterality: N/A;  . ILEOSTOMY  05/20/12  . ILEOSTOMY CLOSURE N/A 09/18/2012   Procedure: Loop Ileostomy Takedown with EUA ;  Surgeon: Adin Hector, MD;  Location: [facility];  Service: General;  Laterality: N/A;  loop ileostomy takedown with EUA   . KNEE ARTHROSCOPY  1998   Right knee  .  POUCHOSCOPY  11/16/2013   Procedure: POUCHOSCOPY;  Surgeon: Leighton Ruff, MD;  Location: [facility];  Service: Endoscopy;;  . POUCHOSCOPY N/A 11/16/2015   Procedure: POUCHOSCOPY;  Surgeon: Leighton Ruff, MD;  Location: [facility];  Service: Endoscopy;  Laterality: N/A;  . POUCHOSCOPY N/A 12/20/2016   Procedure: POUCHOSCOPY;  Surgeon: Leighton Ruff, MD;  [facility];  Service: Endoscopy;  Laterality: N/A;  . POUCHOSCOPY N/A 01/15/2018   Procedure: POUCHOSCOPY;  Surgeon: Leighton Ruff, MD;  Location: [facility];  Service: Endoscopy;  Laterality: N/A;  . POUCHOSCOPY N/A 07/10/2019   Procedure: POUCHOSCOPY;  Surgeon: Leighton Ruff, MD;  Location: [facility];  Service: General;  Laterality: N/A;  . THYROID LOBECTOMY  05/20/2012   Procedure: THYROID LOBECTOMY;  Surgeon: Adin Hector, MD;  Location: [facility];  Service: General;  Laterality: Left;  LEFT THYROID LOBECTOMY  . UPPER GASTROINTESTINAL ENDOSCOPY    . WISDOM TOOTH EXTRACTION     Social History   Socioeconomic History  . Marital status: Married    Spouse name: June  . Number of children:  0  Social Needs  Occupational History  . 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 Smoking status: Former Smoker    Last attempt to quit: 08/07/1991    Years since quitting: 25.9  . Smokeless tobacco: Never Used  . Tobacco comment: quit in 1993  Substance and Sexual Activity  . Alcohol use: No  . Drug use: No  . Sexual activity: Not on file  Other Topics Concern  . 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 aspirin 81 MG tablet Take 81 mg by mouth daily.    Marland Kitchen atorvastatin (LIPITOR) 20 MG tablet Take 1 tablet (20 mg total) by mouth daily. 90 tablet 3  . Blood Glucose Monitoring Suppl (ONE TOUCH ULTRA SYSTEM KIT) W/DEVICE KIT Dispense 1 kit with 100 lancets and strips, with 3 rf on lancets and strips.  Check sugar daily.  Dx E11.9 1 each 0  . Blood Glucose Monitoring Suppl w/Device KIT 1 kit by Does not apply route daily. 1 kit 0  . cholecalciferol (VITAMIN D) 1000 UNITS tablet Take 1,000 Units by mouth daily.     . empagliflozin (JARDIANCE) 10 MG TABS tablet Take 10 mg by mouth daily. 90 tablet 3  . ferrous sulfate 325 (65 FE) MG tablet Take 1 tablet (325 mg total) by mouth 2 (two) times daily. 180 tablet 3  . fluticasone (FLONASE) 50 MCG/ACT nasal spray USE 2 SPRAYS IN EACH NOSTRIL DAILY 16 g 5  . glimepiride (AMARYL) 2 MG tablet 2 tabs in the AM and 1 tab in the PM 270 tablet 3  . levothyroxine (SYNTHROID) 100 MCG tablet Take 1 tablet (100 mcg total) by mouth every morning. 90 tablet 3  . loperamide (ANTI-DIARRHEAL) 2 MG tablet TAKE 1 TO 2 TABLETS BY MOUTH TWICE DAILY AS NEEDED FOR DIARRHEA OR LOOSE STOOLS 360 tablet 3  . loratadine (CLARITIN) 10 MG tablet Take 10 mg by mouth daily.      . metFORMIN (GLUCOPHAGE) 1000 MG tablet Take 2 tabs per day. 180 tablet 3  . ONETOUCH  ULTRA test strip USE TO TEST BLOOD SUGAR DAILY 100 strip 2  . OZEMPIC, 1 MG/DOSE, 4 MG/3ML SOPN INJECT 1 MG INTO THE SKIN ONCE A WEEK 12 mL 1  . vitamin B-12 (CYANOCOBALAMIN) 250 MCG tablet Take 1 tablet (250 mcg total) by mouth daily.     No current facility-administered medications on file prior to visit.   Allergies  Allergen Reactions  . Ibuprofen Swelling    Made his joints swell with high dose   Family History  Problem Relation Age of Onset  . Uterine cancer Mother   . Hypertension Mother   . Stroke Mother   . Colon cancer Mother   . Colon polyps Mother   . Cancer Mother        colon, endometrial  . Diabetes Father   . Obesity Father   . Pneumonia Father   . Cancer Father        skin - squamous   . Diabetes Paternal Grandmother   . Prostate cancer Neg Hx     PE: BP 130/82   Pulse 65   Ht _0  (1.803 m)   Wt 235 lb (106.6 kg)   SpO2 99%   BMI 32.78 kg/m  Wt Readings from Last 3 Encounters:  08/02/20 235 lb (106.6 kg)  06/28/20 235 lb 3.2 oz (106.7 kg)  06/27/20 233 lb 14.4 oz (106.1 kg)   Constitutional: overweight, in NAD Eyes: PERRLA, EOMI, no exophthalmos ENT: moist mucous membranes, no thyromegaly, no cervical lymphadenopathy Cardiovascular: RRR, No MRG, + R>L LE swelling (pitting) Respiratory: CTA B Gastrointestinal: abdomen soft, NT, ND, BS+ Musculoskeletal: no deformities, strength intact in all 4 Skin: moist, warm, no rashes Neurological: no tremor with outstretched hands, DTR normal in all 4  ASSESSMENT: 1. DM2, non-insulin-dependent, uncontrolled, with complications - CKD stage 2  2. H/o Hurthle cell neoplasm  3.  Postsurgical hypothyroidism  4.  Obesity class I  PLAN:  1. Patient with longstanding, uncontrolled, type 2 diabetes, on oral antidiabetic regimen with Metformin, and sugars daily with #1 sulfonylurea, and also weekly GLP-1 receptor agonist  with still poor control.  His HbA1c levels are not very accurate as we are following him with fructosamine levels.  At last visit, HbA1c 5 mg dose again was 7.4%, better correlated with his blood sugars at home compared to the directly measured HbA1c, which was 10%.  We did not change the regimen at that time. 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 -At today's visit, sugars are very slightly higher than before due to the holidays.  He had more dietary indiscretions including desserts but he is planning to reduce these after the Holidays.  Since most of his blood sugars are at goal, we will not change his regimen for now.  I will also not check a fructosamine level today since this is most likely slightly higher, reflecting his blood sugars during the holidays. - I suggested to:  Patient Instructions  Please continue: - Metformin 2000 mg with dinner - Glimepiride 4 mg in am and 2 mg in pm - Jardiance 10 mg daily at night - Ozempic 1 mg weekly  Please continue levothyroxine 100 mcg daily.  Take the thyroid hormone every day, with water, at least 30 minutes before breakfast, separated by at least 4 hours from: - acid reflux medications - calcium - iron - multivitamins  Please return in 3-4 months with your sugar log.  - advised to check sugars at different  times of the day - 1x a day, rotating check times - advised for yearly eye exams >> he is UTD - return to clinic in 3-4 months  2. H/o Hurthle cell neoplasm -Latest ultrasound report showed no suspicions masses in his lower neck.  Reviewed left thyroid pathology: Benign -On the ultrasound from 2013 he had an enlarged lymph node, however, this was not revealed on the latest ultrasound from 2019 -No neck compression symptoms -No further imaging unless he develops neck compression symptoms  3.  Postsurgical hypothyroidism - latest thyroid labs reviewed with pt >> normal: Lab Results  Component Value Date   TSH 4.12  01/26/2020   - he continues on LT4 100 mcg daily - pt feels good on this dose. - we discussed about taking the thyroid hormone every day, with water, >30 minutes before breakfast, separated by >4 hours from acid reflux medications, calcium, iron, multivitamins. Pt. is taking it correctly.  4.  Obesity class I -continue SGLT 2 inhibitor and GLP-1 receptor agonist which should also help with weight loss -Before last visit he lost 2 pounds, but gained them back since last visit  Cory Kingdom, MD PhD [facility]

## 2020-08-02 NOTE — Patient Instructions (Signed)
Please continue: - Metformin 2000 mg with dinner - Glimepiride 4 mg in am and 2 mg in pm - Jardiance 10 mg daily at night - Ozempic 1 mg weekly  Please continue levothyroxine 100 mcg daily.  Take the thyroid hormone every day, with water, at least 30 minutes before breakfast, separated by at least 4 hours from: - acid reflux medications - calcium - iron - multivitamins  Please return in 3-4 months with your sugar log. 

## 2020-08-15 ENCOUNTER — Telehealth: Payer: Self-pay | Admitting: *Deleted

## 2020-08-15 NOTE — Telephone Encounter (Signed)
I would not use intranasal zinc under any circumstance.  Oral zinc (75 mg) may help shorten the duration of some common cold/viral infections.  It may not do anything for prevention.  It is unclear at this point if it will be beneficial for patients with Covid.  He can try oral zinc in the meantime as it is unlikely to be harmful, but I do not want to over promise on the benefit.  Thanks.

## 2020-08-15 NOTE — Telephone Encounter (Signed)
Patient left a voicemail wanting to know if he can take zinc to help fight off covid. Patient is on a lot of medications and wants to know if it is okay to add zinc.

## 2020-08-15 NOTE — Telephone Encounter (Signed)
Called and left voicemail for patient to return call to office.  °

## 2020-08-18 NOTE — Telephone Encounter (Signed)
Called and spoke with patient regarding Dr. Josefine Class recommendations. Patient verbalized understanding.

## 2020-08-18 NOTE — Telephone Encounter (Signed)
Called and left voicemail for patient to return call to office.  °

## 2020-09-26 ENCOUNTER — Other Ambulatory Visit: Payer: Self-pay | Admitting: Family Medicine

## 2020-09-28 ENCOUNTER — Ambulatory Visit: Payer: Self-pay | Admitting: General Surgery

## 2020-10-12 ENCOUNTER — Other Ambulatory Visit: Payer: Self-pay | Admitting: Family Medicine

## 2020-10-13 ENCOUNTER — Other Ambulatory Visit: Payer: Self-pay | Admitting: Family Medicine

## 2020-10-20 ENCOUNTER — Other Ambulatory Visit: Payer: Self-pay | Admitting: Family Medicine

## 2020-10-20 ENCOUNTER — Telehealth: Payer: Self-pay | Admitting: Family Medicine

## 2020-10-20 NOTE — Telephone Encounter (Signed)
Patient called.  Patient received text message that rx was sent to pharmacy.  Patient wants to know if this can be discontinued. Please call patient back to let him know.

## 2020-10-26 NOTE — Telephone Encounter (Signed)
Attempted to reach patient to discuss. If he wants to discontinue the text messages, he will need to call [phone]. Unfortunately, this is a feature that we are not able to adjust in the office.

## 2020-10-28 ENCOUNTER — Other Ambulatory Visit: Payer: Self-pay | Admitting: Family Medicine

## 2020-11-21 ENCOUNTER — Other Ambulatory Visit (HOSPITAL_COMMUNITY): Payer: BC Managed Care – PPO

## 2020-11-29 ENCOUNTER — Other Ambulatory Visit (HOSPITAL_COMMUNITY): Payer: BC Managed Care – PPO

## 2020-12-02 ENCOUNTER — Ambulatory Visit (HOSPITAL_COMMUNITY): Admission: RE | Admit: 2020-12-02 | Payer: BC Managed Care – PPO | Source: Home / Self Care | Admitting: General Surgery

## 2020-12-02 ENCOUNTER — Encounter (HOSPITAL_COMMUNITY): Admission: RE | Payer: Self-pay | Source: Home / Self Care

## 2020-12-02 SURGERY — ENDOSCOPY, POUCH, SMALL INTESTINE, DIAGNOSTIC
Anesthesia: Moderate Sedation

## 2020-12-04 ENCOUNTER — Other Ambulatory Visit: Payer: Self-pay | Admitting: Family Medicine

## 2020-12-06 ENCOUNTER — Encounter: Payer: Self-pay | Admitting: Internal Medicine

## 2020-12-06 ENCOUNTER — Ambulatory Visit (INDEPENDENT_AMBULATORY_CARE_PROVIDER_SITE_OTHER): Payer: BC Managed Care – PPO | Admitting: Internal Medicine

## 2020-12-06 ENCOUNTER — Other Ambulatory Visit: Payer: Self-pay

## 2020-12-06 ENCOUNTER — Telehealth: Payer: Self-pay | Admitting: Internal Medicine

## 2020-12-06 VITALS — BP 120/72 | HR 84 | Ht 71.0 in | Wt 231.8 lb

## 2020-12-06 DIAGNOSIS — N182 Chronic kidney disease, stage 2 (mild): Secondary | ICD-10-CM

## 2020-12-06 DIAGNOSIS — E669 Obesity, unspecified: Secondary | ICD-10-CM | POA: Diagnosis not present

## 2020-12-06 DIAGNOSIS — E890 Postprocedural hypothyroidism: Secondary | ICD-10-CM | POA: Diagnosis not present

## 2020-12-06 DIAGNOSIS — E1122 Type 2 diabetes mellitus with diabetic chronic kidney disease: Secondary | ICD-10-CM

## 2020-12-06 DIAGNOSIS — D34 Benign neoplasm of thyroid gland: Secondary | ICD-10-CM

## 2020-12-06 LAB — POCT GLYCOSYLATED HEMOGLOBIN (HGB A1C): Hemoglobin A1C: 10.5 % — AB (ref 4.0–5.6)

## 2020-12-06 MED ORDER — OZEMPIC (2 MG/DOSE) 8 MG/3ML ~~LOC~~ SOPN: 2.0000 mg | PEN_INJECTOR | SUBCUTANEOUS | 3 refills | Status: DC

## 2020-12-06 NOTE — Progress Notes (Addendum)
Patient ID: Cory Harper, male   DOB: [birth year], 65 y.o.   MRN: [id number]   This visit occurred during the SARS-CoV-2 public health emergency.  Safety protocols were in place, including screening questions prior to the visit, additional usage of staff PPE, and extensive cleaning of exam room while observing appropriate contact time as indicated for disinfecting solutions.   HPI: Cory Harper is a 65 y.o.-year-old male, returning for follow-up for DM2, dx in 1998, non-insulin-dependent, uncontrolled, with long term complications (+ mild CKD). Last visit 4 months ago.  Interim history: No increased urination, blurry vision, nausea. He does have some leg swelling, not new.  Reviewed his HbA1c levels: 04/01/2020: HbA1c calculated from fructosamine is 7.4%, correlating better with your sugars at home. Lab Results  Component Value Date   HGBA1C 9.9 (H) 06/27/2020   HGBA1C 10.0 (H) 01/26/2020   HGBA1C 9.4 (H) 10/19/2019  11/18/2019: HbA1c calculated from fructosamine is 7.5%. 06/17/2019: HbA1c calculated from fructosamine is 7.0%! 05/22/2018: HbA1c calculated from the fructosamine is much better than the measured one, at 7.6%.  He is on: - Metformin 1000 mg 2x a day with meals >> 2000 mg with dinner - Amaryl 4 mg in am and 2 mg in pm - Jardiance 10 mg daily at night to avoid daytime urinary frequency-started 03/2017 - Januvia 100 mg daily in am >> Trulicity 1.5 >> 3 mg weekly-started 07/2017.  Now on Ozempic 1 mg weekly per insurance preference.  No GI side effects. Tried Actos >> signif. Weight gain On Cinnamon.  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  No plans for retirement yet.Marland Kitchen  Pt checks his sugars 1-2 times a day: - am: 91-127, 144 >> 94, 98-140, 150, 160 >> 89-155 - 2h after b'fast: n/c >> 147 >> n/c >> 108, 143, 180 >> 158 - before lunch: 156-160 >> 130-150 >> 134-160, 180 >> 140-160 - 2h after lunch:  171, 178 >> n/c >> 140, 155 >> n/c >> 151-160 -  before dinner:  123 >> n/c >> 151, 160-164 >> n/c  - 2h after dinner: 130 >> n/c >> 145, 148 >> 140, 160 >> 155-160 - bedtime: 151-166 >> 160 >> 140-155 >> 147-160 >> 150-156 - nighttime: n/c >> 180 >> n/c Lowest sugar was  89 >> 94 >> 89; it is unclear at which CBG level he has hypoglycemia awareness. Highest sugar was 161 >> 180 (Christmas, desserts) >> 160.  Glucometer: One Touch Ultra mini  Pt's meals are: - Breakfast: bowl of grits, [place] bacon before starts work,  - Lunch: [place] or ham+cheese wrap or tuna salad - Dinner: baked chicken or pork chop, green beans - Snacks: crackers, desserts  Works: 5:30-9 am, and 1:30-6:30 PM, with few exceptions. Started to use a stationary bike in the evening.  -+ Mild CKD, last BUN/creatinine:  Lab Results  Component Value Date   BUN 19 06/27/2020   BUN 17 10/19/2019   CREATININE 1.25 06/27/2020   CREATININE 1.14 10/19/2019   Lab Results  Component Value Date   GFRNONAA 68 (L) 09/22/2012   + HL; last set of lipids: Lab Results  Component Value Date   CHOL 112 06/27/2020   HDL 43.90 06/27/2020   LDLCALC 34 06/27/2020   LDLDIRECT 52.0 10/19/2019   TRIG 168.0 (H) 06/27/2020   CHOLHDL 3 06/27/2020  On Lipitor 20. On ASA 81. - last eye exam was 03/18/2020: No DR -No numbness and tingling in his feet.  Pt has no FH of DM.  He has familial polyposis and has a history of colon cancer >> has a J-pouch - he had a pouchoscopy in 07/2019.  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  Previous thyroid ultrasound report from 02/2018: 1. Post left thyroid lobectomy without evidence of locally residual or locally recurrent disease. 2. Normal appearance of the remaining thyroid parenchyma.  Pt denies: - feeling nodules in neck - hoarseness - dysphagia - choking - SOB with lying down  Postsurgical hypothyroidism:  Pt is on  levothyroxine 100 mcg daily, taken: - in am - fasting - at least 30 min from b'fast - no Ca, MVI, PPIs - on Fe 2x a day - am (30 min after LT4) and evening - not on Biotin  Reviewed his TFTs: Lab Results  Component Value Date   TSH 4.12 01/26/2020   TSH 3.33 06/17/2019   TSH 3.01 05/22/2018   TSH 3.46 07/25/2017   TSH 2.29 06/27/2016   TSH 1.65 06/14/2015   TSH 1.63 03/11/2014   TSH 2.22 01/19/2013   TSH 2.49 08/19/2012   TSH 5.577 (H) 06/23/2012    ROS: Constitutional: no weight gain/no weight loss, no fatigue, no subjective hyperthermia, no subjective hypothermia Eyes: no blurry vision, no xerophthalmia ENT: no sore throat, + see HPI Cardiovascular: no CP/no SOB/no palpitations/+ leg swelling Respiratory: no cough/no SOB/no wheezing Gastrointestinal: no N/no V/no D/no C/no acid reflux Musculoskeletal: no muscle aches/no joint aches Skin: no rashes, no hair loss Neurological: no tremors/no numbness/no tingling/no dizziness  I reviewed pt's medications, allergies, PMH, social hx, family hx, and changes were documented in the history of present illness. Otherwise, unchanged from my initial visit note.  Past Medical History:  Diagnosis Date  . Anemia   . Arthritis   . Cancer ([place])    stg II colon cancer s/p resection  . Colonic mass   . Diabetes mellitus   . FAP (familial adenomatous polyposis)   . Heart murmur   . Hyperlipidemia   . Hypertension    not on blood pressure meds since 11/13   . Hypothyroidism   . Nasal congestion    Past Surgical History:  Procedure Laterality Date  . BIOPSY  01/15/2018   Procedure: BIOPSY;  Surgeon: Leighton Ruff, MD;  Location: [facility];  Service: Endoscopy;;  . BIOPSY  07/10/2019   Procedure: BIOPSY;  Surgeon: Leighton Ruff, MD;  Location: [facility];  Service: General;;  . BOWEL RESECTION  05/20/2012  . COLON RESECTION  05/20/2012   Procedure: COLON RESECTION LAPAROSCOPIC;  Surgeon: Adin Hector, MD;  Location: [facility];  Service: General;  Laterality: N/A;  Laparoscopic Proctocolectomy, Ileal Pouch Anal Anastomoisis, Loop Ileostomy  . ESOPHAGOGASTRODUODENOSCOPY (EGD) WITH PROPOFOL N/A 04/23/2013   Procedure: ESOPHAGOGASTRODUODENOSCOPY (EGD) WITH PROPOFOL;  Surgeon: Milus Banister, MD;  Location: [facility];  Service: Endoscopy;  Laterality: N/A;  side viewing scope  . FLEXIBLE SIGMOIDOSCOPY  08/01/2012   Procedure: FLEXIBLE SIGMOIDOSCOPY;  Surgeon: Leighton Ruff, MD;  [facility]: [facility];  Service: Endoscopy;  Laterality: N/A;  use endoscope  . FLEXIBLE SIGMOIDOSCOPY N/A 11/29/2014   Procedure: FLEXIBLE SIGMOIDOSCOPY/POUCHOSCOPY;  Surgeon: Leighton Ruff, MD;  Location: [facility];  Service: Endoscopy;  Laterality: N/A;  . ILEOSTOMY  05/20/12  . ILEOSTOMY CLOSURE N/A 09/18/2012   Procedure: Loop Ileostomy Takedown with EUA ;  Surgeon: Adin Hector, MD;  Location: [facility];  Service: General;  Laterality: N/A;  loop ileostomy takedown with EUA   . KNEE ARTHROSCOPY  1998   Right knee  . POUCHOSCOPY  11/16/2013   Procedure: POUCHOSCOPY;  Surgeon: Leighton Ruff, MD;  Location: [facility];  Service: Endoscopy;;  . POUCHOSCOPY N/A 11/16/2015   Procedure: POUCHOSCOPY;  Surgeon: Leighton Ruff, MD;  Location: [facility];  Service: Endoscopy;  Laterality: N/A;  . POUCHOSCOPY N/A 12/20/2016   Procedure: POUCHOSCOPY;  Surgeon: Leighton Ruff, MD;  [facility];  Service: Endoscopy;  Laterality: N/A;  . POUCHOSCOPY N/A 01/15/2018   Procedure: POUCHOSCOPY;  Surgeon: Leighton Ruff, MD;  Location: [facility];  Service: Endoscopy;  Laterality: N/A;  . POUCHOSCOPY N/A 07/10/2019   Procedure: POUCHOSCOPY;  Surgeon: Leighton Ruff, MD;  Location: [facility];  Service: General;  Laterality: N/A;  . THYROID LOBECTOMY  05/20/2012   Procedure: THYROID LOBECTOMY;  Surgeon: Adin Hector, MD;  Location: [facility];  Service: General;  Laterality: Left;  LEFT THYROID LOBECTOMY  . UPPER GASTROINTESTINAL ENDOSCOPY     . WISDOM TOOTH EXTRACTION     Social History   Socioeconomic History  . Marital status: Married    Spouse name: June  . Number of children: 0  Social Needs  Occupational History  . 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 Smoking status: Former Smoker    Last attempt to quit: 08/07/1991    Years since quitting: 25.9  . Smokeless tobacco: Never Used  . Tobacco comment: quit in 1993  Substance and Sexual Activity  . Alcohol use: No  . Drug use: No  . Sexual activity: Not on file  Other Topics Concern  . 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 acetaminophen (TYLENOL) 500 MG tablet Take 1,000 mg by mouth every 6 (six) hours as needed for moderate pain.    Marland Kitchen aspirin 81 MG tablet Take 81 mg by mouth daily.    Marland Kitchen atorvastatin (LIPITOR) 20 MG tablet TAKE 1 TABLET(20 MG) BY MOUTH DAILY 90 tablet 3  . Blood Glucose Monitoring Suppl (ONE TOUCH ULTRA SYSTEM KIT) W/DEVICE KIT Dispense 1 kit with 100 lancets and strips, with 3 rf on lancets and strips.  Check sugar daily.  Dx E11.9 1 each 0  . Blood Glucose Monitoring Suppl w/Device KIT 1 kit by Does not apply route daily. 1 kit 0  . cholecalciferol (VITAMIN D) 1000 UNITS tablet Take 1,000 Units by mouth daily.     . FEROSUL 325 (65 Fe) MG tablet TAKE 1 TABLET(325 MG) BY MOUTH TWICE DAILY (Patient taking differently: Take 325 mg by mouth 2 (two) times daily.) 180 tablet 3  . fluticasone (FLONASE) 50 MCG/ACT nasal spray USE 2 SPRAYS IN EACH NOSTRIL DAILY (Patient taking differently: Place 2 sprays into both nostrils daily as needed for allergies.) 16 g 5  . glimepiride (AMARYL) 2 MG tablet TAKE 2 TABLETS BY MOUTH IN THE MORNING AND THEN 1  TABLET IN THE EVENING (Patient taking differently: Take 2-4 mg by mouth See admin instructions. Take 4 mg in the morning and 2 mg in the evening) 270 tablet 3  . JARDIANCE 10  MG TABS tablet TAKE 1 TABLET BY MOUTH DAILY (Patient taking differently: Take 10 mg by mouth at bedtime.) 90 tablet 3  . levothyroxine (SYNTHROID) 100 MCG tablet TAKE 1 TABLET(100 MCG) BY MOUTH EVERY MORNING (Patient taking differently: Take 100 mcg by mouth daily before breakfast.) 90 tablet 3  . loperamide (ANTI-DIARRHEAL) 2 MG tablet TAKE 1-2 TABLETS BY MOUTH TWICE DAILY AS NEEDED (Patient taking differently: Take 2 mg by mouth 2 (two) times daily.) 360 tablet 3  . loratadine (CLARITIN) 10 MG tablet Take 10 mg by mouth daily.     . metFORMIN (GLUCOPHAGE) 1000 MG tablet TAKE 2 TABLETS BY MOUTH DAILY (Patient taking differently: Take 2,000 mg by mouth daily with supper.) 180 tablet 3  . ONETOUCH ULTRA test strip USE TO TEST BLOOD SUGAR DAILY 100 strip 2  . OZEMPIC, 1 MG/DOSE, 4 MG/3ML SOPN INJECT 1 MG INTO THE SKIN ONCE A WEEK (Patient taking differently: Inject 1 mg as directed every Saturday.) 12 mL 1  . vitamin B-12 (CYANOCOBALAMIN) 500 MCG tablet Take 500 mcg by mouth every other day.     No current facility-administered medications on file prior to visit.   Allergies  Allergen Reactions  . Ibuprofen Swelling    Made his joints swell with high dose   Family History  Problem Relation Age of Onset  . Uterine cancer Mother   . Hypertension Mother   . Stroke Mother   . Colon cancer Mother   . Colon polyps Mother   . Cancer Mother        colon, endometrial  . Diabetes Father   . Obesity Father   . Pneumonia Father   . Cancer Father        skin - squamous   . Diabetes Paternal Grandmother   . Prostate cancer Neg Hx     PE: BP 120/72 (BP Location: Right Arm, Patient Position: Sitting, Cuff Size: Normal)   Pulse 84   Ht _0  (1.803 m)   Wt 231 lb 12.8 oz (105.1 kg)   SpO2 97%   BMI 32.33 kg/m  Wt  Readings from Last 3 Encounters:  12/06/20 231 lb 12.8 oz (105.1 kg)  08/02/20 235 lb (106.6 kg)  06/28/20 235 lb 3.2 oz (106.7 kg)   Constitutional: overweight, in NAD Eyes: PERRLA, EOMI, no exophthalmos ENT: moist mucous membranes, no thyromegaly, no cervical lymphadenopathy Cardiovascular: RRR, No MRG, + R>L LE swelling (pitting) Respiratory: CTA B Gastrointestinal: abdomen soft, NT, ND, BS+ Musculoskeletal: no deformities, strength intact in all 4 Skin: moist, warm, no rashes Neurological: no tremor with outstretched hands, DTR normal in all 4  ASSESSMENT: 1. DM2, non-insulin-dependent, uncontrolled, with complications - CKD stage 2  2. H/o Hurthle cell neoplasm  3.  Postsurgical hypothyroidism  4.  Obesity class I  PLAN:  1. Patient with longstanding, uncontrolled, type 2 diabetes, on oral antidiabetic regimen with metformin, sulfonylurea, and SGLT2 inhibitor and also weekly GLP-1 receptor agonist.  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  At last visit, HbA1c was higher, at 9.9%, but the HbA1c calculated from fructosamine is usually more accurate for him.  This was 7.4% when checked in 03/2020.  We did not change his regimen at last visit. -At today's visit, sugars remain fairly well controlled, fluctuating in a relatively narrow range. The sugars after meals are at goal, while the ones before meals are at or higher than goal. We will increase his Ozempic dose to the newly FDA  approved dose of 2 mg weekly for now.  - I suggested to:  Patient Instructions  Please continue: - Metformin 2000 mg with dinner - Glimepiride 4 mg in am and 2 mg in pm - Jardiance 10 mg daily at night  Please increase: - Ozempic 2 mg weekly  Please continue levothyroxine 100 mcg daily.  Take the thyroid hormone every day, with water, at least 30 minutes before breakfast, separated by at least 4 hours from: - acid reflux medications - calcium - iron - multivitamins  Please  move iron to lunchtime.  Please stop at the lab.  Please return in 3-4 months with your sugar log.  - we checked his HbA1c: 10.4% (higher) -but this is discrepant with his blood sugars at home-we will check a fructosamine level today - advised to check sugars at different times of the day - 1x a day, rotating check times - advised for yearly eye exams >> he is UTD - return to clinic in 3-4 months  2. H/o Hurthle cell neoplasm -Latest ultrasound report showed no suspicious masses in his lower neck.  The left thyroid pathology was benign. -On the ultrasound from 2013 he had an enlarged lymph node, however, this was not revealed on the latest ultrasound from 2019 -He denies neck compression symptoms -No further imaging needed unless he develops neck compression symptoms  3.  Postsurgical hypothyroidism - latest thyroid labs reviewed with pt >> normal: Lab Results  Component Value Date   TSH 4.12 01/26/2020   - he continues on LT4 100 mcg daily - pt feels good on this dose. - we discussed about taking the thyroid hormone every day, with water, >30 minutes before breakfast, separated by >4 hours from acid reflux medications, calcium, iron, multivitamins. Pt. Is not taking it correctly >> he is on iron, which he takes only 30 minutes after levothyroxine, so I advised him to separated by more than 4 hours. - will check thyroid tests at next visit: TSH and fT4 - If labs are abnormal, he will need to return for repeat TFTs in 1.5 months  4.  Obesity class I -continue SGLT 2 inhibitor and GLP-1 receptor agonist which should also help with weight loss -lost 4 lbs since last OV  Component     Latest Ref Rng & Units 12/06/2020  Hemoglobin A1C     4.0 - 5.6 % 10.5 (A)  Fructosamine     205 - 285 umol/L 369 (H)  HbA1c calculated from fructosamine is 7.8%, higher than before, but lower than the directly measured HbA1c.  Philemon Kingdom, MD PhD [facility] End[facility]

## 2020-12-06 NOTE — Patient Instructions (Addendum)
Please continue: - Metformin 2000 mg with dinner - Glimepiride 4 mg in am and 2 mg in pm - Jardiance 10 mg daily at night  Please increase: - Ozempic 2 mg weekly  Please continue levothyroxine 100 mcg daily.  Take the thyroid hormone every day, with water, at least 30 minutes before breakfast, separated by at least 4 hours from: - acid reflux medications - calcium - iron - multivitamins  Please move iron to lunchtime.  Please stop at the lab.  Please return in 3-4 months with your sugar log.

## 2020-12-08 NOTE — Telephone Encounter (Signed)
Patient states pharmacy said ozempic needs a PA - have we received this yet? Patient states tomorrow is the last day before he runs out. Have we gotten a fax for this? And do we have any samples he can use at all? Patient ph#  [phone] (permission to leave a detailed msg)

## 2020-12-09 MED ORDER — OZEMPIC (1 MG/DOSE) 4 MG/3ML ~~LOC~~ SOPN: 2.0000 mg | PEN_INJECTOR | SUBCUTANEOUS | 2 refills | Status: DC

## 2020-12-09 NOTE — Telephone Encounter (Signed)
Pt called back to check the status of this medication. I informed him Dr.Gherghe has seen his request and is working on it.

## 2020-12-09 NOTE — Telephone Encounter (Signed)
Pt called saying the pharmacy told him the cost for the Ozempic is going to cost him $1400 and he cannot afford that. Pt states he is going to be out tomorrow and wants advise on what to do regarding his medication. Pt states he is "too busy to pick up samples today" so that is not an option. I also advised pt to call his insurance to figure out what they will cover he states he is "too busy." I informed pt we are unable to just send in medications for him to test out and his best option is to call his insurance for what they will cover. Pt states he may do this but would like to know if there is something Dr.Gherghe can do about it now that is faster as he will be all out of his medication tomorrow morning.    Pt requests a call " before 1:00 " at [phone]

## 2020-12-09 NOTE — Telephone Encounter (Signed)
Pt called back after speaking with insurance. Pt states his insurance will cover the medications and dosages below:  - ozempic 1 mg - victoza 18 mg - trulicity .75/.5  Pt requests one of these be sent in to the pharmacy below today and asks that we call his cell at [phone] while he is at work to leave him a voicemail that it has been sent in to clear his mind as he is very worried about not having medication tomorrow.  [id number] - [address], [place] - [place] Phone:  [phone]  Fax:  [phone]

## 2020-12-09 NOTE — Addendum Note (Signed)
Addended by: Lauralyn Primes on: 12/09/2020 04:35 PM   Modules accepted: Orders

## 2020-12-09 NOTE — Telephone Encounter (Signed)
Rx sent to preferred pharmacy.

## 2020-12-09 NOTE — Telephone Encounter (Signed)
In that case, lets send the 1 mg Ozempic again - 2 pens per month with 5 refills and advised him to take 2 injections of the 1 mg at the same time for a total of 2 mg weekly.

## 2020-12-09 NOTE — Telephone Encounter (Signed)
Patient called back to check status of Ozempic PA and whether or not he is going to be able to get samples.  Per patient will need medication for dosing tomorrow and does not know what to do.   Patient ph#  [phone] (permission to leave a detailed msg)

## 2020-12-11 LAB — FRUCTOSAMINE: Fructosamine: 369 umol/L — ABNORMAL HIGH (ref 205–285)

## 2020-12-12 MED ORDER — TRULICITY 4.5 MG/0.5ML ~~LOC~~ SOAJ: 4.5000 mg | SUBCUTANEOUS | 3 refills | Status: DC

## 2020-12-12 NOTE — Telephone Encounter (Signed)
Pt called because the 90 day prescription for Ozempic is going to cost the patient over $1400 and he states that is too much. He has called his insurance to see what was going on and they told him that since he upped the dosage from [place] it isnt covered, however, pt states he used to be on trulicity and they told him they could cover that UP TO 4.5 mg. Pt would like to know if he could be switched back to trulicity with a dosage that is covered so that he can get the 90 day supply. Pt says if we stay under 4.5 mg the copay is supposed to be only $30 for each month.   Pt requests a call at [phone] to see what he can do. He states we can leave a voicemail if he does not pick up since he will be home until 1.

## 2020-12-12 NOTE — Addendum Note (Signed)
Addended by: Lauralyn Primes on: 12/12/2020 02:59 PM   Modules accepted: Orders

## 2020-12-12 NOTE — Telephone Encounter (Signed)
Called and left message for pt advising Rx for Trulicity sent to preferred pharmacy.

## 2020-12-12 NOTE — Telephone Encounter (Signed)
Okay to send Trulicity 4.5 mg weekly #12 pens with 3 refills

## 2020-12-12 NOTE — Telephone Encounter (Signed)
Ok to switch 

## 2020-12-27 ENCOUNTER — Ambulatory Visit (INDEPENDENT_AMBULATORY_CARE_PROVIDER_SITE_OTHER): Payer: BC Managed Care – PPO | Admitting: Family Medicine

## 2020-12-27 ENCOUNTER — Other Ambulatory Visit: Payer: Self-pay

## 2020-12-27 ENCOUNTER — Encounter: Payer: Self-pay | Admitting: Family Medicine

## 2020-12-27 VITALS — BP 110/64 | HR 83 | Temp 97.3°F | Ht 71.0 in | Wt 232.0 lb

## 2020-12-27 DIAGNOSIS — Z862 Personal history of diseases of the blood and blood-forming organs and certain disorders involving the immune mechanism: Secondary | ICD-10-CM

## 2020-12-27 DIAGNOSIS — Z0000 Encounter for general adult medical examination without abnormal findings: Secondary | ICD-10-CM

## 2020-12-27 DIAGNOSIS — N182 Chronic kidney disease, stage 2 (mild): Secondary | ICD-10-CM | POA: Diagnosis not present

## 2020-12-27 DIAGNOSIS — E890 Postprocedural hypothyroidism: Secondary | ICD-10-CM | POA: Diagnosis not present

## 2020-12-27 DIAGNOSIS — E1122 Type 2 diabetes mellitus with diabetic chronic kidney disease: Secondary | ICD-10-CM

## 2020-12-27 DIAGNOSIS — Z7189 Other specified counseling: Secondary | ICD-10-CM

## 2020-12-27 LAB — CBC WITH DIFFERENTIAL/PLATELET
Basophils Absolute: 0 10*3/uL (ref 0.0–0.1)
Basophils Relative: 0.9 % (ref 0.0–3.0)
Eosinophils Absolute: 0.2 10*3/uL (ref 0.0–0.7)
Eosinophils Relative: 3.3 % (ref 0.0–5.0)
HCT: 39.5 % (ref 39.0–52.0)
Hemoglobin: 13.4 g/dL (ref 13.0–17.0)
Lymphocytes Relative: 33.4 % (ref 12.0–46.0)
Lymphs Abs: 1.6 10*3/uL (ref 0.7–4.0)
MCHC: 33.9 g/dL (ref 30.0–36.0)
MCV: 92.3 fl (ref 78.0–100.0)
Monocytes Absolute: 0.4 10*3/uL (ref 0.1–1.0)
Monocytes Relative: 7.9 % (ref 3.0–12.0)
Neutro Abs: 2.6 10*3/uL (ref 1.4–7.7)
Neutrophils Relative %: 54.5 % (ref 43.0–77.0)
Platelets: 208 10*3/uL (ref 150.0–400.0)
RBC: 4.28 Mil/uL (ref 4.22–5.81)
RDW: 14.3 % (ref 11.5–15.5)
WBC: 4.8 10*3/uL (ref 4.0–10.5)

## 2020-12-27 LAB — IRON: Iron: 45 ug/dL (ref 42–165)

## 2020-12-27 LAB — TSH: TSH: 3.59 u[IU]/mL (ref 0.35–4.50)

## 2020-12-27 NOTE — Patient Instructions (Signed)
Thanks for your effort.  Take care.  Glad to see you. Go to the lab on the way out.   If you have mychart we'll likely use that to update you.

## 2020-12-27 NOTE — Progress Notes (Signed)
CPE- See plan.  Routine anticipatory guidance given to patient.  See health maintenance.  The possibility exists that previously documented standard health maintenance information may have been brought forward from a previous encounter into this note.  If needed, that same information has been updated to reflect the current situation based on today's encounter.    covid vaccine d/w pt.   Tetanus 2020 Flu done prev done PNA 2014 Shingles prev done.  J pouch eval by Dr. Marcello Moores 2020- he is going to check on scheduling for summer 2022.  D/w pt.   Prostate cancer screening and PSA options(with potential risks and benefits of testing vs not testing) were discussed along with recent recs/guidelines. He declined testing PSAat this point. Living will d/w pt. Wife designated if patient were incapacitated.  Diet and exercise d/w pt.  HIV and HCV prev checked 2017.  Diabetes:  DM per endo.  A1c vs fructosamine level d/w pt.  Using exercise bike. Compliant with meds.  No low sugars.  Sugars 122 this AM.  Eye exam pending.    Hypothyroidism.  No neck mass.  TSH pending. Compliant. D/w pt about med use.    H/o anemia.  Recheck labs pending, on iron BID.    PMH and SH reviewed  Meds, vitals, and allergies reviewed.   ROS: Per HPI.  Unless specifically indicated otherwise in HPI, the patient denies:  General: fever. Eyes: acute vision changes ENT: sore throat Cardiovascular: chest pain Respiratory: SOB GI: vomiting GU: dysuria Musculoskeletal: acute back pain Derm: acute rash Neuro: acute motor dysfunction Psych: worsening mood Endocrine: polydipsia Heme: bleeding Allergy: hayfever  GEN: nad, alert and oriented HEENT: ncat NECK: supple w/o LA CV: rrr. PULM: ctab, no inc wob ABD: soft, +bs EXT: no edema SKIN: no acute rash

## 2020-12-28 DIAGNOSIS — Z862 Personal history of diseases of the blood and blood-forming organs and certain disorders involving the immune mechanism: Secondary | ICD-10-CM | POA: Insufficient documentation

## 2020-12-28 NOTE — Assessment & Plan Note (Signed)
No neck mass.  TSH pending. Compliant. D/w pt about med use.  Continue levothyroxine as is.

## 2020-12-28 NOTE — Assessment & Plan Note (Signed)
DM per endo.  A1c vs fructosamine level d/w pt.  Using exercise bike. Compliant with meds.  No low sugars.  Sugars 122 this AM.  Eye exam pending.

## 2020-12-28 NOTE — Assessment & Plan Note (Signed)
H/o anemia.  Recheck labs pending, on iron BID.

## 2020-12-28 NOTE — Assessment & Plan Note (Signed)
Living will d/w pt.  Wife designated if patient were incapacitated.   ?

## 2020-12-28 NOTE — Assessment & Plan Note (Signed)
covid vaccine d/w pt.   Tetanus 2020 Flu done prev done PNA 2014 Shingles prev done.  J pouch eval by Dr. Marcello Moores 2020- he is going to check on scheduling for summer 2022.  D/w pt.   Prostate cancer screening and PSA options(with potential risks and benefits of testing vs not testing) were discussed along with recent recs/guidelines. He declined testing PSAat this point. Living will d/w pt. Wife designated if patient were incapacitated.  Diet and exercise d/w pt.  HIV and HCV prev checked 2017.

## 2021-01-25 NOTE — Progress Notes (Signed)
Attempted to obtain medical history via telephone, unable to reach at this time. I left a voicemail to return pre surgical testing department's phone call.  

## 2021-01-27 ENCOUNTER — Encounter (HOSPITAL_COMMUNITY): Payer: Self-pay | Admitting: General Surgery

## 2021-01-27 ENCOUNTER — Encounter (HOSPITAL_COMMUNITY)
Admission: RE | Disposition: A | Discharge status: Home / Self Care | Payer: Self-pay | Source: Home / Self Care | Attending: General Surgery

## 2021-01-27 ENCOUNTER — Ambulatory Visit (HOSPITAL_COMMUNITY)
Admission: RE | Admit: 2021-01-27 | Discharge: 2021-01-27 | Disposition: A | Discharge status: Home / Self Care | Payer: BC Managed Care – PPO | Attending: General Surgery | Admitting: General Surgery

## 2021-01-27 ENCOUNTER — Other Ambulatory Visit: Payer: Self-pay

## 2021-01-27 DIAGNOSIS — Z09 Encounter for follow-up examination after completed treatment for conditions other than malignant neoplasm: Secondary | ICD-10-CM | POA: Diagnosis not present

## 2021-01-27 DIAGNOSIS — Z9049 Acquired absence of other specified parts of digestive tract: Secondary | ICD-10-CM | POA: Diagnosis not present

## 2021-01-27 DIAGNOSIS — Z87891 Personal history of nicotine dependence: Secondary | ICD-10-CM | POA: Diagnosis not present

## 2021-01-27 DIAGNOSIS — Z886 Allergy status to analgesic agent status: Secondary | ICD-10-CM | POA: Diagnosis not present

## 2021-01-27 DIAGNOSIS — Z8371 Family history of colonic polyps: Secondary | ICD-10-CM | POA: Insufficient documentation

## 2021-01-27 DIAGNOSIS — Z86010 Personal history of colonic polyps: Secondary | ICD-10-CM | POA: Insufficient documentation

## 2021-01-27 DIAGNOSIS — D1339 Benign neoplasm of other parts of small intestine: Secondary | ICD-10-CM | POA: Diagnosis not present

## 2021-01-27 HISTORY — PX: BIOPSY: SHX5522

## 2021-01-27 HISTORY — PX: POUCHOSCOPY: SHX6321

## 2021-01-27 LAB — GLUCOSE, CAPILLARY: Glucose-Capillary: 135 mg/dL — ABNORMAL HIGH (ref 70–99)

## 2021-01-27 SURGERY — ENDOSCOPY, POUCH, SMALL INTESTINE, DIAGNOSTIC
Anesthesia: Moderate Sedation

## 2021-01-27 MED ORDER — SODIUM CHLORIDE 0.9% FLUSH: 3.0000 mL | Freq: Two times a day (BID) | INTRAVENOUS | Status: DC

## 2021-01-27 NOTE — Discharge Instructions (Signed)
Post Colonoscopy Instructions  DIET: Follow a light bland diet the first 24 hours after arrival home, such as soup, liquids, crackers, etc.  Be sure to include lots of fluids daily.  Avoid fast food or heavy meals as your are more likely to get nauseated.   You may have some mild rectal bleeding for the first few days after the procedure.  This should get less and less with time.  Resume any blood thinners 2 days after your procedure unless directed otherwise by your physician. Take your usually prescribed home medications unless otherwise directed. If you have any pain, it is helpful to get up and walk around, as it is usually from excess gas. If this is not helpful, you can take an over-the-counter pain medication.  Choose one of the following that works best for you: Naproxen (Aleve, etc)  Two 220mg  tabs twice a day Ibuprofen (Advil, etc) Three 200mg  tabs four times a day (every meal & bedtime) If you still have pain after using one of these, please call the office It is normal to not have a bowel movement for 2-3 days after colonoscopy.    ACTIVITIES as tolerated:   You may resume regular (light) daily activities beginning the next day--such as daily self-care, walking, climbing stairs--gradually increasing activities as tolerated.    WHEN TO CALL us [phone]: Fever over 101.5 F (38.5 C)  Severe abdominal or chest pain  Large amount of rectal bleeding, passing multiple blood clots  Dizziness or shortness of breath Increasing nausea or vomiting   The clinic staff is available to answer your questions during regular business hours (8:30am-5pm).  Please don't hesitate to call and ask to speak to one of our nurses for clinical concerns.   If you have a medical emergency, go to the nearest emergency room or call 911.  A surgeon from [facility] is always on call at the [facility], [place], [place], [place], [place]  [address]  ? MAIN: [phone] ? TOLL FREE: [phone] ?  FAX [phone] [email or web address]

## 2021-01-27 NOTE — Op Note (Signed)
[facility] Patient Name: Cory Harper Procedure Date: 01/27/2021 MRN: [id number] Attending MD: Leighton Ruff , MD Date of Birth: [birth year] CSN: [id number] Age: 65 Admit Type: Outpatient Procedure:                Pouchoscopy Indications:              History of total proctocolectomy, History of                            familial polyposis Providers:                Leighton Ruff, MD, Particia Nearing, RN, Cletis Athens,                            Technician Referring MD:              Medicines:                None Complications:            No immediate complications. Estimated blood loss:                            Minimal. Estimated Blood Loss:      Procedure:                Pre-Anesthesia Assessment:                           - Prior to the procedure, a History and Physical                            was performed, and patient medications and                            allergies were reviewed. The patient's tolerance of                            previous anesthesia was also reviewed. The risks                            and benefits of the procedure and the sedation                            options and risks were discussed with the patient.                            All questions were answered, and informed consent                            was obtained. Prior Anticoagulants: The patient has                            taken no previous anticoagulant or antiplatelet                            agents. ASA Grade Assessment: II - A patient with  mild systemic disease. After reviewing the risks                            and benefits, the patient was deemed in                            satisfactory condition to undergo the procedure.                           After obtaining informed consent, the endoscope was                            passed under direct vision. Throughout the                            procedure, the patient's blood  pressure, pulse, and                            oxygen saturations were monitored continuously. The                            [id number]-[id number] ([id number]) Olympus pediatric colonscope                            was introduced through the ileoanal anastomosis via                            the anus and advanced to the J-pouch. The procedure                            was performed without difficulty. The patient                            tolerated the procedure well. Scope In: Scope Out: Findings:      The perianal examination was normal.      The ileoanal pouch contained a few sessile, non-bleeding polyps in the       distal portion. The polyps were less than 5 mm in diameter.       Representative biopsies were taken with a cold forceps for histology.       Estimated blood loss was minimal.      There was a possible forming polyp in the distal rectal cuff. Area was       photographed for future monitoring Impression:               - A few polyps in the distal ileoanal pouch.                            Biopsied. Recommendation:           - Repeat pouchoscopy in 1 year for surveillance. Procedure Code(s):        --- Professional ---                           [id number], Endoscopic evaluation of small intestinal  pouch (eg, Kock pouch, ileal reservoir [S or J]);                            with biopsy, single or multiple Diagnosis Code(s):        --- Professional ---                           D13.39, Benign neoplasm of other parts of small                            intestine                           Z90.49, Acquired absence of other specified parts                            of digestive tract                           Z86.010, Personal history of colonic polyps CPT copyright 2019 [employer]. All rights reserved. The codes documented in this report are preliminary and upon coder review may  be revised to meet current compliance requirements. Leighton Ruff, MD Leighton Ruff, MD 1/91/6606 11:51:33 AM This report has been signed electronically. Number of Addenda: 0

## 2021-01-27 NOTE — H&P (Signed)
CC: FAP, h/o J pouch   HPI: Cory Harper is an 65 y.o. male who is here for surveillance pouchoscopy due to his h/o of FAP.  Past Medical History:  Diagnosis Date   Anemia    Arthritis    Cancer ([place])    stg II colon cancer s/p resection   Colonic mass    Diabetes mellitus    FAP (familial adenomatous polyposis)    Heart murmur    Hyperlipidemia    Hypertension    not on blood pressure meds since 11/13    Hypothyroidism    Nasal congestion     Past Surgical History:  Procedure Laterality Date   BIOPSY  01/15/2018   Procedure: BIOPSY;  Surgeon: Leighton Ruff, MD;  Location: [facility];  Service: Endoscopy;;   BIOPSY  07/10/2019   Procedure: BIOPSY;  Surgeon: Leighton Ruff, MD;  Location: [facility];  Service: General;;   BOWEL RESECTION  05/20/2012   COLON RESECTION  05/20/2012   Procedure: COLON RESECTION LAPAROSCOPIC;  Surgeon: Adin Hector, MD;  Location: [facility];  Service: General;  Laterality: N/A;  Laparoscopic Proctocolectomy, Ileal Pouch Anal Anastomoisis, Loop Ileostomy   ESOPHAGOGASTRODUODENOSCOPY (EGD) WITH PROPOFOL N/A 04/23/2013   Procedure: ESOPHAGOGASTRODUODENOSCOPY (EGD) WITH PROPOFOL;  Surgeon: Milus Banister, MD;  Location: [facility];  Service: Endoscopy;  Laterality: N/A;  side viewing scope   FLEXIBLE SIGMOIDOSCOPY  08/01/2012   Procedure: FLEXIBLE SIGMOIDOSCOPY;  Surgeon: Leighton Ruff, MD;  Location: [facility];  Service: Endoscopy;  Laterality: N/A;  use endoscope   FLEXIBLE SIGMOIDOSCOPY N/A 11/29/2014   Procedure: FLEXIBLE SIGMOIDOSCOPY/POUCHOSCOPY;  Surgeon: Leighton Ruff, MD;  Location: [facility];  Service: Endoscopy;  Laterality: N/A;   ILEOSTOMY  05/20/12   ILEOSTOMY CLOSURE N/A 09/18/2012   Procedure: Loop Ileostomy Takedown with EUA ;  Surgeon: Adin Hector, MD;  Location: [facility];  Service: General;  Laterality: N/A;  loop ileostomy takedown with EUA    KNEE ARTHROSCOPY  1998   Right knee   POUCHOSCOPY  11/16/2013    Procedure: POUCHOSCOPY;  Surgeon: Leighton Ruff, MD;  Location: [facility];  Service: Endoscopy;;   POUCHOSCOPY N/A 11/16/2015   Procedure: POUCHOSCOPY;  Surgeon: Leighton Ruff, MD;  Location: [facility];  Service: Endoscopy;  Laterality: N/A;   POUCHOSCOPY N/A 12/20/2016   Procedure: POUCHOSCOPY;  Surgeon: Leighton Ruff, MD;  Location: [facility];  Service: Endoscopy;  Laterality: N/A;   POUCHOSCOPY N/A 01/15/2018   Procedure: POUCHOSCOPY;  Surgeon: Leighton Ruff, MD;  Location: [facility];  Service: Endoscopy;  Laterality: N/A;   POUCHOSCOPY N/A 07/10/2019   Procedure: POUCHOSCOPY;  Surgeon: Leighton Ruff, MD;  Location: [facility];  Service: General;  Laterality: N/A;   THYROID LOBECTOMY  05/20/2012   Procedure: THYROID LOBECTOMY;  Surgeon: Adin Hector, MD;  Location: [facility];  Service: General;  Laterality: Left;  LEFT THYROID LOBECTOMY   UPPER GASTROINTESTINAL ENDOSCOPY     WISDOM TOOTH EXTRACTION      Family History  Problem Relation Age of Onset   Uterine cancer Mother    Hypertension Mother    Stroke Mother    Colon cancer Mother    Colon polyps Mother    Cancer Mother        colon, endometrial   Diabetes Father    Obesity Father    Pneumonia Father    Cancer Father        skin - squamous    Diabetes Paternal Grandmother    Prostate cancer Neg Hx  Social:  reports that he quit smoking about 29 years ago. He has never used smokeless tobacco. He reports that he does not drink alcohol and does not use drugs.  Allergies:  Allergies  Allergen Reactions   Ibuprofen Swelling    Made his joints swell with high dose    Medications: I have reviewed the patient's current medications.  No results found for this or any previous visit (from the past 48 hour(s)).  No results found.  ROS - all of the below systems have been reviewed with the patient and positives are indicated with bold text General: chills, fever or night sweats Eyes: blurry vision or double  vision ENT: epistaxis or sore throat Allergy/Immunology: itchy/watery eyes or nasal congestion Hematologic/Lymphatic: bleeding problems, blood clots or swollen lymph nodes Endocrine: temperature intolerance or unexpected weight changes Breast: new or changing breast lumps or nipple discharge Resp: cough, shortness of breath, or wheezing CV: chest pain or dyspnea on exertion GI: as per HPI GU: dysuria, trouble voiding, or hematuria MSK: joint pain or joint stiffness Neuro: TIA or stroke symptoms Derm: pruritus and skin lesion changes Psych: anxiety and depression  PE Blood pressure (!) 147/65, pulse 71, temperature (!) 97.4 F (36.3 C), temperature source Axillary, resp. rate 13, height 5\' 11"  (1.803 m), weight 106.1 kg, SpO2 98 %. Constitutional: NAD; conversant; no deformities Eyes: Moist conjunctiva; no lid lag; anicteric; PERRL Neck: Trachea midline; no thyromegaly Lungs: Normal respiratory effort; no tactile fremitus CV: RRR; no palpable thrills; no pitting edema GI: Abd soft; no palpable hepatosplenomegaly MSK: Normal range of motion of extremities; no clubbing/cyanosis Psychiatric: Appropriate affect; alert and oriented x3 Lymphatic: No palpable cervical or axillary lymphadenopathy  No results found for this or any previous visit (from the past 48 hour(s)).  No results found.   A/P: Cory Harper is an 65 y.o. male with FAP, s/p J pouchcreation several years ago.  He is here today for pouchoscopy with possible biopsy.  Risks include bleeding, missed pathology and a very small risk of perforation.  I believe he understands this and wishes to proceed.   Rosario Adie, MD  [facility]

## 2021-01-30 LAB — SURGICAL PATHOLOGY

## 2021-02-02 ENCOUNTER — Encounter (HOSPITAL_COMMUNITY): Payer: Self-pay | Admitting: General Surgery

## 2021-02-09 ENCOUNTER — Encounter (HOSPITAL_COMMUNITY): Payer: Self-pay | Admitting: General Surgery

## 2021-03-20 LAB — HM DIABETES EYE EXAM

## 2021-04-11 ENCOUNTER — Encounter: Payer: Self-pay | Admitting: Internal Medicine

## 2021-04-11 ENCOUNTER — Ambulatory Visit: Payer: BC Managed Care – PPO | Admitting: Internal Medicine

## 2021-04-11 ENCOUNTER — Other Ambulatory Visit: Payer: Self-pay

## 2021-04-11 VITALS — BP 128/78 | HR 71 | Ht 71.0 in | Wt 232.0 lb

## 2021-04-11 DIAGNOSIS — D34 Benign neoplasm of thyroid gland: Secondary | ICD-10-CM | POA: Diagnosis not present

## 2021-04-11 DIAGNOSIS — E890 Postprocedural hypothyroidism: Secondary | ICD-10-CM | POA: Diagnosis not present

## 2021-04-11 DIAGNOSIS — E669 Obesity, unspecified: Secondary | ICD-10-CM | POA: Diagnosis not present

## 2021-04-11 DIAGNOSIS — E1122 Type 2 diabetes mellitus with diabetic chronic kidney disease: Secondary | ICD-10-CM

## 2021-04-11 DIAGNOSIS — N182 Chronic kidney disease, stage 2 (mild): Secondary | ICD-10-CM | POA: Diagnosis not present

## 2021-04-11 LAB — POCT GLYCOSYLATED HEMOGLOBIN (HGB A1C): Hemoglobin A1C: 9.9 % — AB (ref 4.0–5.6)

## 2021-04-11 NOTE — Progress Notes (Signed)
Patient ID: Cory Harper, male   DOB: 02/02/1956, 64 y.o.   MRN: [id number]   This visit occurred during the SARS-CoV-2 public health emergency.  Safety protocols were in place, including screening questions prior to the visit, additional usage of staff PPE, and extensive cleaning of exam room while observing appropriate contact time as indicated for disinfecting solutions.   HPI: Cory Harper is a 64 y.o.-year-old male, returning for follow-up for DM2, dx in 1998, non-insulin-dependent, uncontrolled, with long term complications (+ mild CKD). Last visit 4 months ago.  Interim history: No increased urination, blurry vision, nausea, chest pain. He is not exercising. He works long hours driving a school bus. He has been in service 20 years.  Reviewed his HbA1c levels: 12/06/2020: HbA1c calculated from fructosamine is 7.8%, higher than before, but lower than the directly measured HbA1c. Lab Results  Component Value Date   HGBA1C 10.5 (A) 12/06/2020   HGBA1C 9.9 (H) 06/27/2020   HGBA1C 10.0 (H) 01/26/2020  04/01/2020: HbA1c calculated from fructosamine is 7.4%, correlating better with your sugars at home. 11/18/2019: HbA1c calculated from fructosamine is 7.5%. 06/17/2019: HbA1c calculated from fructosamine is 7.0%! 05/22/2018: HbA1c calculated from the fructosamine is much better than the measured one, at 7.6%.  He is on: - Metformin 1000 mg 2x a day with meals >> 2000 mg with dinner - Amaryl 4 mg in am and 2 mg in pm - Jardiance 10 mg daily at night to avoid daytime urinary frequency-started 03/2017 - Januvia 100 mg daily in am >> Trulicity 1.5 >> 3 mg weekly-started 07/2017 >> Ozempic 1 >> 2 mg weekly >> Trulicity 4.5 mg weekly Tried Actos >> signif. Weight gain On Cinnamon.  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  No plans for retirement yet..  Pt checks his sugars 1-2 times a day: - am: 91-127, 144 >> 94, 98-140, 150, 160 >> 89-155 >> 80, 93-137, 147 - 2h  after b'fast: n/c >> 147 >> n/c >> 108, 143, 180 >> 158 >> 122-155 - before lunch: 130-150 >> 134-160, 180 >> 140-160 >> 126-150, 155 - 2h after lunch:  n/c >> 140, 155 >> n/c >> 151-160 >> 152-157 - before dinner:  123 >> n/c >> 151, 160-164 >> n/c  - 2h after dinner: 145, 148 >> 140, 160 >> 155-160 >> 138-160 - bedtime: 151-166 >> 160 >> 140-155 >> 147-160 >> 150-156 >> n/c - nighttime: n/c >> 180 >> n/c Lowest sugar was  89 >> 94 >> 89 >> 80; it is unclear at which CBG level he has hypoglycemia awareness. Highest sugar was 161 >> 180 (Christmas, desserts) >> 160 >> 160.  Glucometer: One Touch Ultra mini>> One Touch Ultra  Pt's meals are: - Breakfast: bowl of grits, turkey bacon before starts work, - Lunch: turkey or ham+cheese wrap or tuna salad - Dinner: baked chicken or pork chop, green beans - Snacks: crackers, desserts  Works: 5:30-9 am, and 1:30-6:30 PM, with few exceptions. Started to use a stationary bike in the evening.  -+ Mild CKD, last BUN/creatinine:  Lab Results  Component Value Date   BUN 19 06/27/2020   BUN 17 10/19/2019   CREATININE 1.25 06/27/2020   CREATININE 1.14 10/19/2019   Lab Results  Component Value Date   GFRNONAA 68 (L) 09/22/2012   + HL; last set of lipids: Lab Results  Component Value Date   CHOL 112 06/27/2020   HDL 43.90 06/27/2020   LDLCALC 34 06/27/2020     LDLDIRECT 52.0 10/19/2019   TRIG 168.0 (H) 06/27/2020   CHOLHDL 3 06/27/2020  On Lipitor 20. On ASA 81.  - last eye exam was 03/2021: No DR reportedly.  -No numbness and tingling in his feet.    Pt has no FH of DM.  He has familial polyposis and has a history of colon cancer >> has a J-pouch - he had a pouchoscopy in 07/2019.  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  Previous thyroid ultrasound report from 02/2018: 1. Post left thyroid lobectomy without evidence of locally  residual or locally recurrent disease. 2. Normal appearance of the remaining thyroid parenchyma.  Pt denies: - feeling nodules in neck - hoarseness - dysphagia - choking - SOB with lying down  Postsurgical hypothyroidism:  Pt is on levothyroxine 100 mcg daily, taken: - in am - fasting - at least 30 min from b'fast - no Ca, MVI, PPIs - on Fe 2x a day - am (30 min after LT4) and evening >> moved later in the day - not on Biotin  Reviewed his TFTs: Lab Results  Component Value Date   TSH 3.59 12/27/2020   TSH 4.12 01/26/2020   TSH 3.33 06/17/2019   TSH 3.01 05/22/2018   TSH 3.46 07/25/2017   TSH 2.29 06/27/2016   TSH 1.65 06/14/2015   TSH 1.63 03/11/2014   TSH 2.22 01/19/2013   TSH 2.49 08/19/2012    ROS: Constitutional: no weight gain/no weight loss, no fatigue, no subjective hyperthermia, no subjective hypothermia Eyes: no blurry vision, no xerophthalmia ENT: no sore throat, + see HPI Cardiovascular: no CP/no SOB/no palpitations/+ leg swelling Respiratory: no cough/no SOB/no wheezing Gastrointestinal: no N/no V/no D/no C/no acid reflux Musculoskeletal: no muscle aches/no joint aches Skin: no rashes, no hair loss Neurological: no tremors/no numbness/no tingling/no dizziness  I reviewed pt's medications, allergies, PMH, social hx, family hx, and changes were documented in the history of present illness. Otherwise, unchanged from my initial visit note.  Past Medical History:  Diagnosis Date   Anemia    Arthritis    Cancer ([place])    stg II colon cancer s/p resection   Colonic mass    Diabetes mellitus    FAP (familial adenomatous polyposis)    Heart murmur    Hyperlipidemia    Hypertension    not on blood pressure meds since 11/13    Hypothyroidism    Nasal congestion    Past Surgical History:  Procedure Laterality Date   BIOPSY  01/15/2018   Procedure: BIOPSY;  Surgeon: Leighton Ruff, MD;  Location: [facility];  Service: Endoscopy;;   BIOPSY  07/10/2019    Procedure: BIOPSY;  Surgeon: Leighton Ruff, MD;  Location: [facility];  Service: General;;   BIOPSY  01/27/2021   Procedure: BIOPSY;  Surgeon: Leighton Ruff, MD;  Location: [facility];  Service: Endoscopy;;   BOWEL RESECTION  05/20/2012   COLON RESECTION  05/20/2012   Procedure: COLON RESECTION LAPAROSCOPIC;  Surgeon: Adin Hector, MD;  Location: [facility];  Service: General;  Laterality: N/A;  Laparoscopic Proctocolectomy, Ileal Pouch Anal Anastomoisis, Loop Ileostomy   ESOPHAGOGASTRODUODENOSCOPY (EGD) WITH PROPOFOL N/A 04/23/2013   Procedure: ESOPHAGOGASTRODUODENOSCOPY (EGD) WITH PROPOFOL;  Surgeon: Milus Banister, MD;  [facility];  Service: Endoscopy;  Laterality: N/A;  side viewing scope   FLEXIBLE SIGMOIDOSCOPY  08/01/2012   Procedure: FLEXIBLE SIGMOIDOSCOPY;  Surgeon: Leighton Ruff, MD;  [facility];  Service: Endoscopy;  Laterality: N/A;  use endoscope   FLEXIBLE SIGMOIDOSCOPY N/A 11/29/2014   Procedure: FLEXIBLE SIGMOIDOSCOPY/POUCHOSCOPY;  Surgeon: Alicia Thomas, MD;  [facility];  Service: Endoscopy;  Laterality: N/A;   ILEOSTOMY  05/20/12   ILEOSTOMY CLOSURE N/A 09/18/2012   Procedure: Loop Ileostomy Takedown with EUA ;  Surgeon: Steven C. Gross, MD;  Location: [facility];  Service: General;  Laterality: N/A;  loop ileostomy takedown with EUA    KNEE ARTHROSCOPY  1998   Right knee   POUCHOSCOPY  11/16/2013   Procedure: POUCHOSCOPY;  Surgeon: Alicia Thomas, MD;  [facility];  Service: Endoscopy;;   POUCHOSCOPY N/A 11/16/2015   Procedure: POUCHOSCOPY;  Surgeon: Alicia Thomas, MD;  [facility];  Service: Endoscopy;  Laterality: N/A;   POUCHOSCOPY N/A 12/20/2016   Procedure: POUCHOSCOPY;  Surgeon: Thomas, Alicia, MD;  [facility];  Service: Endoscopy;  Laterality: N/A;   POUCHOSCOPY N/A 01/15/2018   Procedure: POUCHOSCOPY;  Surgeon: Thomas, Alicia, MD;  [facility];  Service: Endoscopy;  Laterality: N/A;    POUCHOSCOPY N/A 07/10/2019   Procedure: POUCHOSCOPY;  Surgeon: Thomas, Alicia, MD;  [facility];  Service: General;  Laterality: N/A;   POUCHOSCOPY N/A 01/27/2021   Procedure: POUCHOSCOPY;  Surgeon: Thomas, Alicia, MD;  [facility];  Service: Endoscopy;  Laterality: N/A;   THYROID LOBECTOMY  05/20/2012   Procedure: THYROID LOBECTOMY;  Surgeon: Steven C. 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  Tobacco Use   Smoking status: Former Smoker    Last attempt to quit: 08/07/1991    Years since quitting: 25.9   Smokeless tobacco: Never Used   Tobacco comment: quit in 1993  Substance and Sexual Activity   Alcohol use: No   Drug use: No   Sexual activity: Not on file  Other Topics Concern   Not on file  Social History Narrative   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     aspirin 81 MG tablet Take 81 mg by mouth daily.     atorvastatin (LIPITOR) 20 MG tablet TAKE 1 TABLET(20 MG) BY MOUTH DAILY (Patient taking differently: Take 20 mg by mouth daily.) 90 tablet 3   Blood Glucose Monitoring Suppl (ONE TOUCH ULTRA SYSTEM KIT) W/DEVICE KIT Dispense 1 kit with 100 lancets and strips, with 3 rf on lancets and strips.  Check sugar daily.  Dx E11.9 1 each 0   Blood Glucose Monitoring Suppl w/Device KIT 1 kit by Does not  apply route daily. 1 kit 0   cholecalciferol (VITAMIN D) 1000 UNITS tablet Take 1,000 Units by mouth daily.      Dulaglutide (TRULICITY) 4.5 MG/0.5ML SOPN Inject 4.5 mg as directed once   a week. 6 mL 3   FEROSUL 325 (65 Fe) MG tablet TAKE 1 TABLET(325 MG) BY MOUTH TWICE DAILY (Patient taking differently: Take 325 mg by mouth 2 (two) times daily with a meal.) 180 tablet 3   fluticasone (FLONASE) 50 MCG/ACT nasal spray USE 2 SPRAYS IN EACH NOSTRIL DAILY (Patient taking differently: Place 2 sprays into both nostrils daily.) 16 g 5   glimepiride (AMARYL) 2 MG tablet TAKE 2 TABLETS BY MOUTH IN THE MORNING AND THEN 1 TABLET IN THE EVENING 270 tablet 3   JARDIANCE 10 MG TABS tablet TAKE 1 TABLET BY MOUTH DAILY (Patient taking differently: Take 10 mg by mouth daily.) 90 tablet 3   levothyroxine (SYNTHROID) 100 MCG tablet TAKE 1 TABLET(100 MCG) BY MOUTH EVERY MORNING (Patient taking differently: Take 100 mcg by mouth daily before breakfast.) 90 tablet 3   loperamide (ANTI-DIARRHEAL) 2 MG tablet TAKE 1-2 TABLETS BY MOUTH TWICE DAILY AS NEEDED (Patient taking differently: Take 2-4 mg by mouth 2 (two) times daily as needed for diarrhea or loose stools. TAKE 1-2 TABLETS BY MOUTH TWICE DAILY AS NEEDED) 360 tablet 3   loratadine (CLARITIN) 10 MG tablet Take 10 mg by mouth daily.      metFORMIN (GLUCOPHAGE) 1000 MG tablet TAKE 2 TABLETS BY MOUTH DAILY (Patient taking differently: Take 2,000 mg by mouth every evening. TAKE 2 TABLETS BY MOUTH DAILY) 180 tablet 3   ONETOUCH ULTRA test strip USE TO TEST BLOOD SUGAR DAILY 100 strip 2   vitamin B-12 (CYANOCOBALAMIN) 500 MCG tablet Take 500 mcg by mouth every other day.     No current facility-administered medications on file prior to visit.   Allergies  Allergen Reactions   Ibuprofen Swelling    Made his joints swell with high dose   Family History  Problem Relation Age of Onset   Uterine cancer Mother    Hypertension Mother    Stroke Mother    Colon cancer  Mother    Colon polyps Mother    Cancer Mother        colon, endometrial   Diabetes Father    Obesity Father    Pneumonia Father    Cancer Father        skin - squamous    Diabetes Paternal Grandmother    Prostate cancer Neg Hx     PE: BP 128/78 (BP Location: Right Arm, Patient Position: Sitting, Cuff Size: Normal)   Pulse 71   Ht 5' 11" (1.803 m)   Wt 232 lb (105.2 kg)   SpO2 98%   BMI 32.36 kg/m  Wt Readings from Last 3 Encounters:  04/11/21 232 lb (105.2 kg)  01/27/21 234 lb (106.1 kg)  12/27/20 232 lb (105.2 kg)   Constitutional: overweight, in NAD Eyes: PERRLA, EOMI, no exophthalmos ENT: moist mucous membranes, no thyromegaly, no cervical lymphadenopathy Cardiovascular: RRR, No MRG, + R>L LE swelling (pitting) - chronic Respiratory: CTA B Gastrointestinal: abdomen soft, NT, ND, BS+ Musculoskeletal: no deformities, strength intact in all 4 Skin: moist, warm, no rashes Neurological: no tremor with outstretched hands, DTR normal in all 4  ASSESSMENT: 1. DM2, non-insulin-dependent, uncontrolled, with complications - CKD stage 2  2. H/o Hurthle cell neoplasm  3.  Postsurgical hypothyroidism  4.  Obesity class I  PLAN:  1. Patient with longstanding, uncontrolled, type 2 diabetes, on oral antidiabetic regimen with metformin, sulfonylurea, SGLT2 inhibitor also weekly GLP-1 receptor agonist.  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    At last visit, HbA1c calculated from fructosamine was 7.8%, much better than the directly measured HbA1c which was 10.5%.  We had to change his weekly GLP-1 receptor agonist few times due to insurance preference and, currently, shortage of Ozempic 2 mg weekly.  Currently on Trulicity 4.5 mg weekly.   -At last visit, sugars were fluctuating within a relatively narrow range and they were fairly well controlled.  His HbA1c is usually much higher than expected from the log and higher than the fructosamine level. -At this  visit, sugars are mostly at goal, with 3 exceptions, especially before lunch- snack mid-am.  There are no low blood sugars and no sugars above 160.  The estimated HbA1c from his log is 7.0% or possibly even lower.  For now, I did not suggest any change in regimen.  Mounjaro would be an option in the future if sugars became more uncontrolled, but this does not appear to be covered by his insurance currently. - I suggested to:  Patient Instructions  Please continue: - Metformin 2000 mg with dinner - Glimepiride 4 mg in am and 2 mg in pm - Jardiance 10 mg daily at night - Trulicity 4.5 mg weekly  Please continue levothyroxine 100 mcg daily.  Take the thyroid hormone every day, with water, at least 30 minutes before breakfast, separated by at least 4 hours from: - acid reflux medications - calcium - iron - multivitamins  Please return in 3-4 months with your sugar log.  - we checked his HbA1c: 9.9% (lower)  - advised to check sugars at different times of the day - 1x a day, rotating check times - advised for yearly eye exams >> he is UTD - return to clinic in 3-4 months  2. H/o Hurthle cell neoplasm -Latest ultrasound report showed no suspicious masses in the lower neck.  The left thyroid pathology was benign. -On the ultrasound from 2013 he had an enlarged lymph node, however, this was not obvious on the latest ultrasound from 2019 -He denies problems with swallowing, neck pressure, choking, shortness of breath with lying down -No further imaging needed unless he develops neck compression symptoms  3.  Postsurgical hypothyroidism - latest thyroid labs reviewed with pt. >> normal: Lab Results  Component Value Date   TSH 3.59 12/27/2020  - he continues on LT4 100 mcg daily - pt feels good on this dose. - we discussed about taking the thyroid hormone every day, with water, >30 minutes before breakfast, separated by >4 hours from acid reflux medications, calcium, iron, multivitamins.  Pt. is taking it correctly now -we moved iron later in the day at last visit.  4.  Obesity class I -continue SGLT 2 inhibitor and GLP-1 receptor agonist which should also help with weight loss -lost 4 lbs before last visit and gained 2 pounds since then   Cristina Gherghe, MD PhD [facility]   

## 2021-04-11 NOTE — Patient Instructions (Signed)
Please continue: - Metformin 2000 mg with dinner - Glimepiride 4 mg in am and 2 mg in pm - Jardiance 10 mg daily at night - Trulicity 4.5 mg weekly  Please continue levothyroxine 100 mcg daily.  Take the thyroid hormone every day, with water, at least 30 minutes before breakfast, separated by at least 4 hours from: - acid reflux medications - calcium - iron - multivitamins  Please return in 3-4 months with your sugar log.

## 2021-05-12 ENCOUNTER — Ambulatory Visit: Payer: BC Managed Care – PPO | Admitting: Family Medicine

## 2021-05-29 ENCOUNTER — Other Ambulatory Visit: Payer: Self-pay | Admitting: Family Medicine

## 2021-06-13 ENCOUNTER — Ambulatory Visit: Payer: BC Managed Care – PPO | Admitting: Family Medicine

## 2021-06-13 ENCOUNTER — Other Ambulatory Visit: Payer: Self-pay

## 2021-06-13 ENCOUNTER — Encounter: Payer: Self-pay | Admitting: Family Medicine

## 2021-06-13 VITALS — BP 122/70 | HR 89 | Temp 98.3°F | Ht 71.0 in | Wt 231.0 lb

## 2021-06-13 DIAGNOSIS — Z862 Personal history of diseases of the blood and blood-forming organs and certain disorders involving the immune mechanism: Secondary | ICD-10-CM | POA: Diagnosis not present

## 2021-06-13 DIAGNOSIS — D649 Anemia, unspecified: Secondary | ICD-10-CM | POA: Diagnosis not present

## 2021-06-13 DIAGNOSIS — E1122 Type 2 diabetes mellitus with diabetic chronic kidney disease: Secondary | ICD-10-CM | POA: Diagnosis not present

## 2021-06-13 DIAGNOSIS — N182 Chronic kidney disease, stage 2 (mild): Secondary | ICD-10-CM | POA: Diagnosis not present

## 2021-06-13 DIAGNOSIS — E7800 Pure hypercholesterolemia, unspecified: Secondary | ICD-10-CM

## 2021-06-13 DIAGNOSIS — E538 Deficiency of other specified B group vitamins: Secondary | ICD-10-CM

## 2021-06-13 LAB — CBC WITH DIFFERENTIAL/PLATELET
Basophils Absolute: 0 10*3/uL (ref 0.0–0.1)
Basophils Relative: 0.6 % (ref 0.0–3.0)
Eosinophils Absolute: 0.1 10*3/uL (ref 0.0–0.7)
Eosinophils Relative: 2.8 % (ref 0.0–5.0)
HCT: 39.4 % (ref 39.0–52.0)
Hemoglobin: 13 g/dL (ref 13.0–17.0)
Lymphocytes Relative: 26 % (ref 12.0–46.0)
Lymphs Abs: 1.3 10*3/uL (ref 0.7–4.0)
MCHC: 33 g/dL (ref 30.0–36.0)
MCV: 91.4 fl (ref 78.0–100.0)
Monocytes Absolute: 0.4 10*3/uL (ref 0.1–1.0)
Monocytes Relative: 7.4 % (ref 3.0–12.0)
Neutro Abs: 3.2 10*3/uL (ref 1.4–7.7)
Neutrophils Relative %: 63.2 % (ref 43.0–77.0)
Platelets: 229 10*3/uL (ref 150.0–400.0)
RBC: 4.31 Mil/uL (ref 4.22–5.81)
RDW: 14.2 % (ref 11.5–15.5)
WBC: 5.1 10*3/uL (ref 4.0–10.5)

## 2021-06-13 LAB — COMPREHENSIVE METABOLIC PANEL
ALT: 13 U/L (ref 0–53)
AST: 14 U/L (ref 0–37)
Albumin: 3.9 g/dL (ref 3.5–5.2)
Alkaline Phosphatase: 75 U/L (ref 39–117)
BUN: 20 mg/dL (ref 6–23)
CO2: 28 mEq/L (ref 19–32)
Calcium: 9.5 mg/dL (ref 8.4–10.5)
Chloride: 102 mEq/L (ref 96–112)
Creatinine, Ser: 1.09 mg/dL (ref 0.40–1.50)
GFR: 72.05 mL/min (ref >60.00)
Glucose, Bld: 133 mg/dL — ABNORMAL HIGH (ref 70–99)
Potassium: 4.5 mEq/L (ref 3.5–5.1)
Sodium: 137 mEq/L (ref 135–145)
Total Bilirubin: 0.7 mg/dL (ref 0.2–1.2)
Total Protein: 7 g/dL (ref 6.0–8.3)

## 2021-06-13 LAB — MICROALBUMIN / CREATININE URINE RATIO
Creatinine,U: 103.5 mg/dL
Microalb Creat Ratio: 0.7 mg/g (ref 0.0–30.0)
Microalb, Ur: <0.7 mg/dL (ref 0.0–1.9)

## 2021-06-13 LAB — LIPID PANEL
Cholesterol: 128 mg/dL (ref 0–200)
HDL: 38.9 mg/dL — ABNORMAL LOW (ref >39.00)
NonHDL: 89.32
Total CHOL/HDL Ratio: 3
Triglycerides: 207 mg/dL — ABNORMAL HIGH (ref 0.0–149.0)
VLDL: 41.4 mg/dL — ABNORMAL HIGH (ref 0.0–40.0)

## 2021-06-13 LAB — VITAMIN B12: Vitamin B-12: 1327 pg/mL — ABNORMAL HIGH (ref 211–911)

## 2021-06-13 LAB — LDL CHOLESTEROL, DIRECT: Direct LDL: 54 mg/dL

## 2021-06-13 LAB — IRON: Iron: 65 ug/dL (ref 42–165)

## 2021-06-13 NOTE — Patient Instructions (Addendum)
Don't change your meds for now.  Go to the lab on the way out.   If you have mychart we'll likely use that to update you.    Take care.  Glad to see you.  Plan on follow up in about March or April of 2023.

## 2021-06-13 NOTE — Progress Notes (Signed)
This visit occurred during the SARS-CoV-2 public health emergency.  Safety protocols were in place, including screening questions prior to the visit, additional usage of staff PPE, and extensive cleaning of exam room while observing appropriate contact time as indicated for disinfecting solutions.  Follow up.  DM2 per endo.  Sugar this AM 114.  His A1c readings are higher than expected based on his home readings.  His fructosamine correlates with his home sugars and it is lower than his A1c.  He is still driving for work, covering multiple school.  No low sugars.  Cautions d/w pt.  Eye exam done 03/2021 at [facility].    H/o anemia.  On iron BID most days with occ missed 2nd dose.  Due for f/u labs.  See notes on labs.  He had f/u with GI/general surgery.    His sister was dx'd with breast cancer.  She graduated from chemo recently.  She is going to have surgery soon.  D/w pt.    H/o low B12 and on oral replacement.  Recheck pending.  Compliant with replacement.  See notes on labs.   Elevated Cholesterol: Using medications without problems: yes Muscle aches: no Diet compliance: yes Exercise: encouraged.    Meds, vitals, and allergies reviewed.   ROS: Per HPI unless specifically indicated in ROS section   GEN: nad, alert and oriented HEENT: ncat NECK: supple w/o LA CV: rrr. PULM: ctab, no inc wob ABD: soft, +bs EXT: no edema SKIN: well perfused.

## 2021-06-14 ENCOUNTER — Other Ambulatory Visit: Payer: Self-pay | Admitting: Family Medicine

## 2021-06-14 DIAGNOSIS — D649 Anemia, unspecified: Secondary | ICD-10-CM

## 2021-06-14 DIAGNOSIS — E538 Deficiency of other specified B group vitamins: Secondary | ICD-10-CM

## 2021-06-14 MED ORDER — FERROUS SULFATE 325 (65 FE) MG PO TABS: 325.0000 mg | ORAL_TABLET | Freq: Every day | ORAL | Status: DC

## 2021-06-14 MED ORDER — CYANOCOBALAMIN 500 MCG PO TABS: 500.0000 ug | ORAL_TABLET | ORAL | Status: DC

## 2021-06-14 NOTE — Assessment & Plan Note (Signed)
See notes on follow-up labs.  We may be able to decrease iron to daily not twice daily.

## 2021-06-14 NOTE — Assessment & Plan Note (Signed)
Continue atorvastatin.  See notes on labs.  Continue work on diet and exercise.  We will route this note to endocrinology as FYI, and with appreciation for endocrine help.

## 2021-06-14 NOTE — Assessment & Plan Note (Signed)
See notes on labs.  Continue replacement with B12 as is, for now.

## 2021-06-14 NOTE — Assessment & Plan Note (Signed)
Reasonable control on home readings.  History of elevated A1c that does not correspond to his fructosamine level.  See notes on labs.  No change in metformin Trulicity glimepiride and Jardiance.  Continue statin.

## 2021-06-16 ENCOUNTER — Inpatient Hospital Stay: Payer: BC Managed Care – PPO | Attending: Oncology | Admitting: Oncology

## 2021-06-16 ENCOUNTER — Other Ambulatory Visit: Payer: Self-pay

## 2021-06-16 ENCOUNTER — Ambulatory Visit: Payer: BC Managed Care – PPO | Attending: Internal Medicine

## 2021-06-16 ENCOUNTER — Other Ambulatory Visit (HOSPITAL_BASED_OUTPATIENT_CLINIC_OR_DEPARTMENT_OTHER): Payer: Self-pay

## 2021-06-16 VITALS — BP 149/76 | HR 96 | Temp 98.2°F | Resp 18 | Ht 71.0 in | Wt 232.0 lb

## 2021-06-16 DIAGNOSIS — Z23 Encounter for immunization: Secondary | ICD-10-CM

## 2021-06-16 DIAGNOSIS — Z79899 Other long term (current) drug therapy: Secondary | ICD-10-CM | POA: Diagnosis not present

## 2021-06-16 DIAGNOSIS — C180 Malignant neoplasm of cecum: Secondary | ICD-10-CM | POA: Insufficient documentation

## 2021-06-16 DIAGNOSIS — E119 Type 2 diabetes mellitus without complications: Secondary | ICD-10-CM | POA: Insufficient documentation

## 2021-06-16 DIAGNOSIS — Z8719 Personal history of other diseases of the digestive system: Secondary | ICD-10-CM | POA: Diagnosis not present

## 2021-06-16 MED ORDER — MODERNA COVID-19 BIVAL BOOSTER 50 MCG/0.5ML IM SUSP
INTRAMUSCULAR | 0 refills | Status: DC
  Filled 2021-06-16: qty 0.5, 1d supply, fill #0

## 2021-06-16 NOTE — Progress Notes (Signed)
   Covid-19 Vaccination Clinic  Name:  Cory Harper    MRN: [id number] DOB: [birth year]  06/16/2021  [name] was observed post Covid-19 immunization for 15 minutes without incident. He was provided with Vaccine Information Sheet and instruction to access the V-Safe system.   [name] was instructed to call 911 with any severe reactions post vaccine: Difficulty breathing  Swelling of face and throat  A fast heartbeat  A bad rash all over body  Dizziness and weakness   Immunizations Administered     Name Date Dose VIS Date Route   Moderna Covid-19 vaccine Bivalent Booster 06/16/2021 10:42 AM 0.5 mL 03/18/2021 Intramuscular   Manufacturer: Moderna   Lot: [id number]   [place]: [id number]

## 2021-06-16 NOTE — Progress Notes (Signed)
  [facility] OFFICE PROGRESS NOTE   Diagnosis: Colon cancer  INTERVAL HISTORY:   Mr. Debes returns as scheduled.  He feels well.  He is working.  No complaint.  He continues follow-up of the J-pouch with Dr. Marcello Moores.  He undergoes endoscopic surveillance at [facility].  Objective:  Vital signs in last 24 hours:  Blood pressure (!) 149/76, pulse 96, temperature 98.2 F (36.8 C), temperature source Oral, resp. rate 18, height 5\' 11"  (1.803 m), weight 232 lb (105.2 kg), SpO2 98 %.    Lymphatics: No cervical, supraclavicular, axillary, or inguinal nodes Resp: Lungs clear bilaterally Cardio: Regular rate and rhythm GI: No mass, no hepatosplenomegaly Vascular: No leg edema   Lab Results:  Lab Results  Component Value Date   WBC 5.1 06/13/2021   HGB 13.0 06/13/2021   HCT 39.4 06/13/2021   MCV 91.4 06/13/2021   PLT 229.0 06/13/2021   NEUTROABS 3.2 06/13/2021    CMP  Lab Results  Component Value Date   NA 137 06/13/2021   K 4.5 06/13/2021   CL 102 06/13/2021   CO2 28 06/13/2021   GLUCOSE 133 (H) 06/13/2021   BUN 20 06/13/2021   CREATININE 1.09 06/13/2021   CALCIUM 9.5 06/13/2021   PROT 7.0 06/13/2021   ALBUMIN 3.9 06/13/2021   AST 14 06/13/2021   ALT 13 06/13/2021   ALKPHOS 75 06/13/2021   BILITOT 0.7 06/13/2021   GFRNONAA 68 (L) 09/22/2012   GFRAA 79 (L) 09/22/2012    Lab Results  Component Value Date   [name]1 1.45 06/16/2018   [name] <0.5 06/15/2016     Medications: I have reviewed the patient's current medications.   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 Ileostomy takedown 09/18/2012.   2. Familial polyposis with multiple adenomatous polyps noted on the colectomy specimen 05/20/2012 -followed  At [facility] and  by Dr. Marcello Moores for surveillance endoscopies.   3. Left thyroid low-grade Hurthle cell  neoplasm, status post a left thyroidectomy on 05/20/2012.   4. Diabetes.   5. Status post endoscopic removal of a duodenal polyp at [facility] on 06/08/2013 and 10/25/2014, removal of adenoma at the minor papilla at [facility] 05/21/2017, duodenal polyps at [facility] 11/26/2017-adenomatous polyps removed near the papilla, [facility] EGD 02/09/2019 21-2 mm mucosal nodules in the first portion of the duodenum biopsy-chronic active duodenitis, negative for dysplasia   Disposition: Cory Harper is in clinical remission from colon cancer.  He would like to continue follow-up with the cancer center.  He will return for an office visit in 1 year.  He continues upper endoscopy follow-up at [facility] and follow-up of the J-pouch with Dr. Marcello Moores.  Betsy Coder, MD  06/16/2021  10:02 AM

## 2021-06-17 LAB — FRUCTOSAMINE: Fructosamine: 371 umol/L — ABNORMAL HIGH (ref 205–285)

## 2021-07-18 ENCOUNTER — Telehealth: Payer: Self-pay | Admitting: Family Medicine

## 2021-07-18 NOTE — Telephone Encounter (Signed)
I had called to speak to Mrs. Minner but he wanted to let Dr. Damita Dunnings know he got his covid booster on 11/11 at [facility] and he got the [facility]

## 2021-07-18 NOTE — Telephone Encounter (Signed)
Covid vaccines are already up to date in patients chart.

## 2021-08-14 ENCOUNTER — Ambulatory Visit: Payer: BC Managed Care – PPO | Admitting: Internal Medicine

## 2021-08-21 ENCOUNTER — Other Ambulatory Visit: Payer: Self-pay | Admitting: Family Medicine

## 2021-09-01 ENCOUNTER — Other Ambulatory Visit: Payer: Self-pay

## 2021-09-01 ENCOUNTER — Ambulatory Visit: Payer: BC Managed Care – PPO | Admitting: Internal Medicine

## 2021-09-01 ENCOUNTER — Encounter: Payer: Self-pay | Admitting: Internal Medicine

## 2021-09-01 ENCOUNTER — Other Ambulatory Visit: Payer: Self-pay | Admitting: Internal Medicine

## 2021-09-01 VITALS — BP 120/78 | HR 86 | Ht 71.0 in | Wt 228.2 lb

## 2021-09-01 DIAGNOSIS — E669 Obesity, unspecified: Secondary | ICD-10-CM | POA: Diagnosis not present

## 2021-09-01 DIAGNOSIS — E1122 Type 2 diabetes mellitus with diabetic chronic kidney disease: Secondary | ICD-10-CM

## 2021-09-01 DIAGNOSIS — N182 Chronic kidney disease, stage 2 (mild): Secondary | ICD-10-CM

## 2021-09-01 DIAGNOSIS — E66811 Obesity, class 1: Secondary | ICD-10-CM

## 2021-09-01 DIAGNOSIS — E890 Postprocedural hypothyroidism: Secondary | ICD-10-CM

## 2021-09-01 DIAGNOSIS — D34 Benign neoplasm of thyroid gland: Secondary | ICD-10-CM | POA: Diagnosis not present

## 2021-09-01 NOTE — Progress Notes (Addendum)
Patient ID: Cory Harper, male   DOB: [birth year], 66 y.o.   MRN: [id number]   This visit occurred during the SARS-CoV-2 public health emergency.  Safety protocols were in place, including screening questions prior to the visit, additional usage of staff PPE, and extensive cleaning of exam room while observing appropriate contact time as indicated for disinfecting solutions.   HPI: Cory Harper is a 66 y.o.-year-old male, returning for follow-up for DM2, dx in 1998, non-insulin-dependent, uncontrolled, with long term complications (+ mild CKD). Last visit 4 months ago.  Interim history: No increased urination, blurry vision, nausea, chest pain. He is not exercising. He works long hours driving a school bus. He has been in service 20 years. His iron was decreased from 2x a day to 1x a day recently.  Reviewed his HbA1c levels: 06/13/2021: HbA1c calculated from fructosamine is 7.9% Lab Results  Component Value Date   HGBA1C 9.9 (A) 04/11/2021   HGBA1C 10.5 (A) 12/06/2020   HGBA1C 9.9 (H) 06/27/2020  12/06/2020: HbA1c calculated from fructosamine is 7.8%, higher than before, but lower than the directly measured HbA1c. 04/01/2020: HbA1c calculated from fructosamine is 7.4%, correlating better with your sugars at home. 11/18/2019: HbA1c calculated from fructosamine is 7.5%. 06/17/2019: HbA1c calculated from fructosamine is 7.0%! 05/22/2018: HbA1c calculated from the fructosamine is much better than the measured one, at 7.6%.  He is on: - Metformin 1000 mg 2x a day with meals >> 2000 mg with dinner - Amaryl 4 mg in am and 2 mg in pm - Jardiance 10 mg daily at night to avoid daytime urinary frequency-started 97/4163 - JTrulicity 4.5 mg weekly Tried Actos >> signif. Weight gain On Cinnamon. He was on Januvia, Ozempic.  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  No plans for retirement yet.Marland Kitchen  Pt checks his sugars 1-2 times a day: - am:  94, 98-140, 150, 160 >> 89-155  >> 80, 93-137, 147 >> 102-155, 160 (at today's visit: 144) - 2h after b'fast: 108, 143, 180 >> 158 >> 122-155 >> n/c - before lunch: 134-160, 180 >> 140-160 >> 126-150, 155>> 130-155, 160 - 2h after lunch: 140, 155 >> n/c >> 151-160 >> 152-157 >> 155 - before dinner:  123 >> n/c >> 151, 160-164 >> n/c  - 2h after dinner: 140, 160 >> 155-160 >> 138-160 >> 150-161 - bedtime: 1 140-155 >> 147-160 >> 150-156 >> n/c - nighttime: n/c >> 180 >> n/c Lowest sugar was  89 >> 80 >> 102 ; it is unclear at which CBG level he has hypoglycemia awareness. Highest sugar was 180 (Christmas, desserts) >> 160 >> 160 >> 161.  Glucometer: One Touch Ultra mini>> One Touch Ultra  Pt's meals are: - Breakfast: bowl of grits, [place] bacon before starts work, - Lunch: [place] or ham+cheese wrap or tuna salad - Dinner: baked chicken or pork chop, green beans - Snacks: crackers, desserts  Works: 5:30-9 am, and 1:30-6:30 PM, with few exceptions. Started to use a stationary bike in the evening.  -+ Mild CKD, last BUN/creatinine:  Lab Results  Component Value Date   BUN 20 06/13/2021   BUN 19 06/27/2020   CREATININE 1.09 06/13/2021   CREATININE 1.25 06/27/2020   Lab Results  Component Value Date   GFRNONAA 68 (L) 09/22/2012   + HL; last set of lipids: Lab Results  Component Value Date   CHOL 128 06/13/2021   HDL 38.90 (L) 06/13/2021   LDLCALC 34 06/27/2020  LDLDIRECT 54.0 06/13/2021   TRIG 207.0 (H) 06/13/2021   CHOLHDL 3 06/13/2021  On Lipitor 20. On ASA 81.  - last eye exam was 03/2021: No DR reportedly.  -No numbness and tingling in his feet.    Pt has no FH of DM.  He has familial polyposis and has a history of colon cancer >> has a J-pouch - he had a pouchoscopy in 07/2019.  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  Previous thyroid ultrasound report from 02/2018: 1. Post left thyroid  lobectomy without evidence of locally residual or locally recurrent disease. 2. Normal appearance of the remaining thyroid parenchyma.  Pt denies: - feeling nodules in neck - hoarseness - dysphagia - choking - SOB with lying down  Postsurgical hypothyroidism:  Pt is on levothyroxine 100 mcg daily, taken: - in am - fasting - at least 30 min from b'fast - no Ca, MVI, PPIs - on Fe 2x a day - am (30 min after LT4) and evening >> moved later in the day - not on Biotin  Reviewed his TFTs: Lab Results  Component Value Date   TSH 3.59 12/27/2020   TSH 4.12 01/26/2020   TSH 3.33 06/17/2019   TSH 3.01 05/22/2018   TSH 3.46 07/25/2017   TSH 2.29 06/27/2016   TSH 1.65 06/14/2015   TSH 1.63 03/11/2014   TSH 2.22 01/19/2013   TSH 2.49 08/19/2012    ROS: + see HPI + leg swelling  I reviewed pt's medications, allergies, PMH, social hx, family hx, and changes were documented in the history of present illness. Otherwise, unchanged from my initial visit note.  Past Medical History:  Diagnosis Date   Anemia    Arthritis    Cancer ([place])    stg II colon cancer s/p resection   Colonic mass    Diabetes mellitus    FAP (familial adenomatous polyposis)    Heart murmur    Hyperlipidemia    Hypertension    not on blood pressure meds since 11/13    Hypothyroidism    Nasal congestion    Past Surgical History:  Procedure Laterality Date   BIOPSY  01/15/2018   Procedure: BIOPSY;  Surgeon: Leighton Ruff, MD;  Location: [facility];  Service: Endoscopy;;   BIOPSY  07/10/2019   Procedure: BIOPSY;  Surgeon: Leighton Ruff, MD;  Location: [facility];  Service: General;;   BIOPSY  01/27/2021   Procedure: BIOPSY;  Surgeon: Leighton Ruff, MD;  Location: [facility];  Service: Endoscopy;;   BOWEL RESECTION  05/20/2012   COLON RESECTION  05/20/2012   Procedure: COLON RESECTION LAPAROSCOPIC;  Surgeon: Adin Hector, MD;  Location: [facility];  Service: General;  Laterality: N/A;   Laparoscopic Proctocolectomy, Ileal Pouch Anal Anastomoisis, Loop Ileostomy   ESOPHAGOGASTRODUODENOSCOPY (EGD) WITH PROPOFOL N/A 04/23/2013   Procedure: ESOPHAGOGASTRODUODENOSCOPY (EGD) WITH PROPOFOL;  Surgeon: Milus Banister, MD;  [facility]: [facility];  Service: Endoscopy;  Laterality: N/A;  side viewing scope   FLEXIBLE SIGMOIDOSCOPY  08/01/2012   Procedure: FLEXIBLE SIGMOIDOSCOPY;  Surgeon: Leighton Ruff, MD;  [facility];  Service: Endoscopy;  Laterality: N/A;  use endoscope   FLEXIBLE SIGMOIDOSCOPY N/A 11/29/2014   Procedure: FLEXIBLE SIGMOIDOSCOPY/POUCHOSCOPY;  Surgeon: Leighton Ruff, MD;  [facility]: [facility];  Service: Endoscopy;  Laterality: N/A;   ILEOSTOMY  05/20/12   ILEOSTOMY CLOSURE N/A 09/18/2012   Procedure: Loop Ileostomy Takedown with EUA ;  Surgeon: Adin Hector, MD;  Location: [facility];  Service: General;  Laterality: N/A;  loop ileostomy takedown with EUA    KNEE ARTHROSCOPY  1998   Right knee   POUCHOSCOPY  11/16/2013   Procedure: POUCHOSCOPY;  Surgeon: Leighton Ruff, MD;  [facility];  Service: Endoscopy;;   POUCHOSCOPY N/A 11/16/2015   Procedure: POUCHOSCOPY;  Surgeon: Leighton Ruff, MD;  [facility];  Service: Endoscopy;  Laterality: N/A;   POUCHOSCOPY N/A 12/20/2016   Procedure: POUCHOSCOPY;  Surgeon: Leighton Ruff, MD;  [facility];  Service: Endoscopy;  Laterality: N/A;   POUCHOSCOPY N/A 01/15/2018   Procedure: POUCHOSCOPY;  Surgeon: Leighton Ruff, MD;  [facility];  Service: Endoscopy;  Laterality: N/A;   POUCHOSCOPY N/A 07/10/2019   Procedure: POUCHOSCOPY;  Surgeon: Leighton Ruff, MD;  Location: [facility];  Service: General;  Laterality: N/A;   POUCHOSCOPY N/A 01/27/2021   Procedure: POUCHOSCOPY;  Surgeon: Leighton Ruff, MD;  [facility];  Service: Endoscopy;  Laterality: N/A;   THYROID LOBECTOMY  05/20/2012   Procedure: THYROID LOBECTOMY;  Surgeon: Adin Hector, MD;  Location: [facility];   Service: General;  Laterality: Left;  LEFT THYROID LOBECTOMY   UPPER GASTROINTESTINAL ENDOSCOPY     WISDOM TOOTH EXTRACTION     Social History   Socioeconomic History   Marital status: Married    Spouse name: June   Number of children: 0  Social Needs  Occupational History   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[place]: 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, works in the [place] when able  Tobacco Use   Smoking status: Former Smoker    Last attempt to quit: 08/07/1991    Years since quitting: 25.9   Smokeless tobacco: Never Used   Tobacco comment: quit in 1993  Substance and Sexual Activity   Alcohol use: No   Drug use: No   Sexual activity: Not on file  Other Topics Concern   Not on file  Social History Narrative   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     aspirin 81 MG tablet Take 81 mg by mouth daily.     atorvastatin (LIPITOR) 20 MG tablet TAKE 1 TABLET(20 MG) BY MOUTH DAILY 90 tablet 3   Blood Glucose Monitoring Suppl (ONE TOUCH ULTRA SYSTEM KIT) W/DEVICE KIT Dispense 1 kit with 100 lancets and strips, with 3 rf on lancets and strips.  Check sugar daily.  Dx E11.9 1 each 0   cholecalciferol (VITAMIN D) 1000 UNITS tablet Take 1,000 Units by mouth daily.      COVID-19 mRNA bivalent vaccine, Moderna, (MODERNA COVID-19 BIVAL BOOSTER) 50 MCG/0.5ML injection Inject into the muscle. 0.5 mL 0   Dulaglutide (TRULICITY) 4.5 WI/2.0BT SOPN Inject 4.5 mg as directed once a week. 6 mL 3   ferrous sulfate (FEROSUL) 325 (65 FE) MG tablet Take 1 tablet (325 mg total) by mouth daily.     fluticasone (FLONASE) 50 MCG/ACT nasal spray USE 2 SPRAYS IN EACH NOSTRIL DAILY 16 g 5   glimepiride (AMARYL) 2 MG tablet  TAKE 2 TABLETS BY MOUTH IN THE MORNING AND THEN 1 TABLET IN THE EVENING  270 tablet 3   JARDIANCE 10 MG TABS tablet TAKE 1 TABLET BY MOUTH DAILY 90 tablet 3   levothyroxine (SYNTHROID) 100 MCG tablet TAKE 1 TABLET(100 MCG) BY MOUTH EVERY MORNING 90 tablet 3   loperamide (ANTI-DIARRHEAL) 2 MG tablet TAKE 1-2 TABLETS BY MOUTH TWICE DAILY AS NEEDED 360 tablet 3   loratadine (CLARITIN) 10 MG tablet Take 10 mg by mouth daily.      metFORMIN (GLUCOPHAGE) 1000 MG tablet TAKE 2 TABLETS BY MOUTH DAILY 180 tablet 3   ONETOUCH ULTRA test strip USE TO TEST BLOOD SUGAR DAILY 100 strip 2   vitamin B-12 (CYANOCOBALAMIN) 500 MCG tablet Take 1 tablet (500 mcg total) by mouth 2 (two) times a week.     No current facility-administered medications on file prior to visit.   Allergies  Allergen Reactions   Ibuprofen Swelling    Made his joints swell with high dose   Family History  Problem Relation Age of Onset   Uterine cancer Mother    Hypertension Mother    Stroke Mother    Colon cancer Mother    Colon polyps Mother    Cancer Mother        colon, endometrial   Diabetes Father    Obesity Father    Pneumonia Father    Cancer Father        skin - squamous    Diabetes Paternal Grandmother    Prostate cancer Neg Hx     PE: BP 120/78 (BP Location: Right Arm, Patient Position: Sitting, Cuff Size: Normal)    Pulse 86    Ht _0  (1.803 m)    Wt 228 lb 3.2 oz (103.5 kg)    SpO2 94%    BMI 31.83 kg/m  Wt Readings from Last 3 Encounters:  09/01/21 228 lb 3.2 oz (103.5 kg)  06/16/21 232 lb (105.2 kg)  06/13/21 231 lb (104.8 kg)   Constitutional: overweight, in NAD Eyes: PERRLA, EOMI, no exophthalmos ENT: moist mucous membranes, no thyromegaly, no cervical lymphadenopathy Cardiovascular: RRR, No MRG, + R>L LE swelling (pitting) - chronic Respiratory: CTA B Musculoskeletal: no deformities, strength intact in all 4 Skin: moist, warm, no rashes Neurological: no tremor with outstretched hands, DTR  normal in all 4 Diabetic Foot Exam - Simple   Simple Foot Form Diabetic Foot exam was performed with the following findings: Yes 09/01/2021  8:42 AM  Visual Inspection See comments: Yes Sensation Testing Intact to touch and monofilament testing bilaterally: Yes Pulse Check Posterior Tibialis and Dorsalis pulse intact bilaterally: Yes Comments Onychodystrophy bilateral halluces    ASSESSMENT: 1. DM2, non-insulin-dependent, uncontrolled, with complications - CKD stage 2  2. H/o Hurthle cell neoplasm  3.  Postsurgical hypothyroidism  4.  Obesity class I  PLAN:  1. Patient with longstanding, uncontrolled, type 2 diabetes, on oral antidiabetic regimen with metformin, sulfonylurea, SGLT2 inhibitor and also weekly GLP-1 receptor agonist.  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 -At last visit, the directly measured HbA1c level was lower than before, at 9.9%, so we did not check a fructosamine level.  2 months ago, and HbA1c calculated from fructosamine was 7.9%.  At last visit, sugars were mostly at goal, with few hyperglycemic exceptions, especially before lunch due to a snack mid a.m.  There were no low blood sugars and no sugars above 160.  He estimated HbA1c from his log was approximately 7% or lower.  I did not suggest any changes in his  regimen. -At today's visit, sugars are slightly higher than goal in the morning, but very stable throughout the day, usually between 130-160.  The estimated HbA1c from the above blood sugars will be ~6.6%.  We did check his blood sugars in the office and this was consistent with what he sees at home, so her glucometer is most likely accurate.  At today's visit, we discussed about working on improving dinner but otherwise, I did not suggest a change in regimen - I suggested to:  Patient Instructions  Please continue: - Metformin 2000 mg with dinner - Glimepiride 4 mg in am and 2 mg in pm - Jardiance 10 mg daily at night -  Trulicity 4.5 mg weekly  Please continue levothyroxine 100 mcg daily.  Take the thyroid hormone every day, with water, at least 30 minutes before breakfast, separated by at least 4 hours from: - acid reflux medications - calcium - iron - multivitamins  Please return in 3-4 months with your sugar log.  - advised to check sugars at different times of the day - 1-2x a day, rotating check times - advised for yearly eye exams >> he is UTD - foot exam performed today - return to  clinic in 3-4 months  2. H/o Hurthle cell neoplasm -Latest ultrasound report showed no suspicious masses in the lower neck.  The left thyroid pathology was benign -On the ultrasound from 2017, he had an enlarged lymph node, however, this was not obvious on the latest ultrasound from 2019 -He denies problems with swallowing, neck pressure, choking, shortness of breath with lying down -No further imaging needed unless he develops neck compression symptoms  3.  Postsurgical hypothyroidism - latest thyroid labs reviewed with pt. >> normal: Lab Results  Component Value Date   TSH 3.59 12/27/2020  - he continues on LT4 100 mcg daily - pt feels good on this dose. - we discussed about taking the thyroid hormone every day, with water, >30 minutes before breakfast, separated by >4 hours from acid reflux medications, calcium, iron, multivitamins. Pt. is taking it correctly. - will check thyroid tests at next visit  4.  Obesity class I -continue SGLT 2 inhibitor and GLP-1 receptor agonist which should also help with weight loss -He gained 2 pounds before last visit -At this visit, he lost 4 pounds  Component     Latest Ref Rng & Units 09/01/2021  Fructosamine     205 - 285 umol/L 397 (H)  HbA1c calculated from fructosamine is 8.36%, slightly higher than before.  I am hoping that this would improve after the holidays.  However, it is still discrepant with his blood sugars at home.  I would suggest to get a freestyle  libre CGM.  Philemon Kingdom, MD PhD [facility] Endocrinology

## 2021-09-01 NOTE — Patient Instructions (Signed)
Please continue: - Metformin 2000 mg with dinner - Glimepiride 4 mg in am and 2 mg in pm - Jardiance 10 mg daily at night - Trulicity 4.5 mg weekly  Please continue levothyroxine 100 mcg daily.  Take the thyroid hormone every day, with water, at least 30 minutes before breakfast, separated by at least 4 hours from: - acid reflux medications - calcium - iron - multivitamins  Please return in 3-4 months with your sugar log.

## 2021-09-05 LAB — FRUCTOSAMINE: Fructosamine: 397 umol/L — ABNORMAL HIGH (ref 205–285)

## 2021-09-20 ENCOUNTER — Other Ambulatory Visit: Payer: Self-pay | Admitting: Family Medicine

## 2021-10-12 ENCOUNTER — Other Ambulatory Visit: Payer: Self-pay | Admitting: Family Medicine

## 2021-10-23 ENCOUNTER — Telehealth: Payer: Self-pay | Admitting: Gastroenterology

## 2021-10-23 NOTE — Telephone Encounter (Signed)
Hey Dr. Ardis Hughs,  ? ?Patient called back. States he need to be seen for EGD for abnormal duodenal mucosa, not for colonoscopy. Could you please advise? ? ?Thank you ?

## 2021-10-23 NOTE — Telephone Encounter (Signed)
Good afternoon, Dr. Ardis Hughs, ? ?This patient was calling to say that he was due for a colonoscopy around July of this year.  The last time you saw him, you referred him to [facility].  His records from his visit there are in [place].  He wants to come back to you for his colonoscopy.  Please let me know if you approve of his coming back to you.   ? ?Thank you. ?

## 2021-10-25 NOTE — Telephone Encounter (Signed)
Left vm to return call to schedule appt 

## 2021-11-13 ENCOUNTER — Encounter: Payer: Self-pay | Admitting: Gastroenterology

## 2021-11-22 ENCOUNTER — Other Ambulatory Visit (HOSPITAL_COMMUNITY): Payer: Self-pay

## 2021-12-04 ENCOUNTER — Other Ambulatory Visit: Payer: Self-pay | Admitting: Family Medicine

## 2021-12-05 ENCOUNTER — Other Ambulatory Visit (HOSPITAL_COMMUNITY): Payer: Self-pay

## 2021-12-06 ENCOUNTER — Ambulatory Visit: Payer: BC Managed Care – PPO | Admitting: Gastroenterology

## 2021-12-06 ENCOUNTER — Encounter: Payer: Self-pay | Admitting: Gastroenterology

## 2021-12-06 VITALS — BP 118/66 | HR 89 | Ht 71.0 in | Wt 229.0 lb

## 2021-12-06 DIAGNOSIS — D132 Benign neoplasm of duodenum: Secondary | ICD-10-CM

## 2021-12-06 NOTE — Patient Instructions (Signed)
If you are age 65 or younger, your body mass index should be between 19-25. Your Body mass index is 31.94 kg/m?Marland Kitchen If this is out of the aformentioned range listed, please consider follow up with your Primary Care Provider.  ?________________________________________________________ ? ?The [facility] providers would like to encourage you to use [facility] to communicate with providers for non-urgent requests or questions.  Due to long hold times on the telephone, sending your provider a message by [facility] may be a faster and more efficient way to get a response.  Please allow 48 business hours for a response.  Please remember that this is for non-urgent requests.  ?_______________________________________________________ ? ?You have been scheduled for an endoscopy. Please follow written instructions given to you at your visit today. ?If you use inhalers (even only as needed), please bring them with you on the day of your procedure. ? ?Due to recent changes in healthcare laws, you may see the results of your imaging and laboratory studies on MyChart before your provider has had a chance to review them.  We understand that in some cases there may be results that are confusing or concerning to you. Not all laboratory results come back in the same time frame and the provider may be waiting for multiple results in order to interpret others.  Please give [place] 48 hours in order for your provider to thoroughly review all the results before contacting the office for clarification of your results.  ? ?Thank you for entrusting me with your care and choosing [facility]. ? ?Dr Ardis Hughs ? ? ?

## 2021-12-06 NOTE — Progress Notes (Signed)
Review of pertinent gastrointestinal problems: ?1. 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 Followed with serial pouchoscopies with Dr. Leighton Ruff, last was 2022 ?2. Familial polyposis with multiple adenomatous polyps noted on the colectomy specimen 05/20/2012; originally he was diagnosed with FAP around year 2000 by Dr. Fuller Plan but he was abstinent and declined surgical recommendations that were spoken to him and also put in writing. ?3.  Duodenal adenomas: EGD 04/2012 Dr. Ardis Hughs for UGI tract screening in FAP found abnormal duodenal mucosa, The biopsies of his upper intestines showed mild villous atrophy.  Celiac Panel was equivocal. EGD Dr. Ardis Hughs 2014 abnormal musosa in duodenum, + adenomatous.  He was referred to [facility] for endoscopic treatments, resection. Care at [facility] Dr. Lysle Rubens included EGDs 2014, 2015, 2016, 2017. Then eventually EGD 2018 Dr. Ardis Hughs: found recurrent adenomatuos mucosa at the major papilla and referred back to [facility] for further care. Care at [facility] for this, Dr. Lysle Rubens with ERCP 2018, then EGD 2019, last EGD I can see in epic was 02/2020 (Dr. Lysle Rubens and a Fellow) they describe "a few 2 mm mucosal nodules with a diffuse distribution were found in the first portion of the duodenum.  Biopsies were taken with cold forceps"  The major papilla was normal.  And biopsies showed no sign of residual adenoma. ? ? ?HPI: ?This is a very pleasant 66 year old man ? ?I last saw him about 5 years ago.  He is here today to reestablish care. ? ?He has a history of familial adenomatous polyposis.  He has had a total colectomy with J-pouch.  He gets pouchoscopy's about once a year with Dr. Marcello Moores. ? ?He also has a history of duodenal adenomas.  Under extensive surveillance regimen here previously and intermittently up to [facility] when he had a periampullary diverticulum.  Dr. Gayleen Orem was very helpful for this.   His last EGD was about 2 years ago, see the results above. ? ?He really has no troubles with abdominal pains, nausea or vomiting.  He has 4-6 loose stools daily. ? ?He would like to reestablish care with me for his surveillance upper endoscopies rather than having to drive to [facility]. ? 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 ?Review of systems: ?Pertinent positive and negative review of systems were noted in the above HPI section. All other review negative. ? ? ?Past Medical History:  ?Diagnosis Date  ? Anemia   ? Arthritis   ? Cancer [facility]) 2013  ? stg II colon cancer s/p resection  ? Colonic mass   ? Diabetes mellitus   ? FAP (familial adenomatous polyposis)   ? Heart murmur   ? Hyperlipidemia   ? Hypertension   ? not on blood pressure meds since 11/13   ? Hypothyroidism   ? Nasal congestion   ? ? ?Past Surgical History:  ?Procedure Laterality Date  ? BIOPSY  01/15/2018  ? Procedure: BIOPSY;  Surgeon: Leighton Ruff, MD;  Location: [facility];  Service: Endoscopy;;  ? BIOPSY  07/10/2019  ? Procedure: BIOPSY;  Surgeon: Leighton Ruff, MD;  Location: [facility];  Service: General;;  ? BIOPSY  01/27/2021  ? Procedure: BIOPSY;  Surgeon: Leighton Ruff, MD;  Location: [facility];  Service: Endoscopy;;  ? BOWEL RESECTION  05/20/2012  ? COLON RESECTION  05/20/2012  ? Procedure: COLON RESECTION LAPAROSCOPIC;  Surgeon: Adin Hector, MD;  Location: [facility];  Service: General;  Laterality: N/A;  Laparoscopic Proctocolectomy, Ileal Pouch Anal Anastomoisis, Loop Ileostomy  ? ESOPHAGOGASTRODUODENOSCOPY (EGD) WITH PROPOFOL N/A 04/23/2013  ? Procedure: ESOPHAGOGASTRODUODENOSCOPY (EGD) WITH PROPOFOL;  Surgeon: Milus Banister, MD;  Location: [facility];  Service: Endoscopy;  Laterality: N/A;  side viewing scope  ? FLEXIBLE SIGMOIDOSCOPY  08/01/2012  ? Procedure: FLEXIBLE SIGMOIDOSCOPY;  Surgeon: Leighton Ruff, MD;  Location: [facility];  Service: Endoscopy;  Laterality: N/A;  use  endoscope  ? FLEXIBLE SIGMOIDOSCOPY N/A 11/29/2014  ? Procedure: FLEXIBLE SIGMOIDOSCOPY/POUCHOSCOPY;  Surgeon: Leighton Ruff, MD;  Location: [facility];  Service: Endoscopy;  Laterality: N/A;  ? ILEOSTOMY  05/20/12  ? ILEOSTOMY CLOSURE N/A 09/18/2012  ? Procedure: Loop Ileostomy Takedown with EUA ;  Surgeon: Adin Hector, MD;  Location: [facility];  Service: General;  Laterality: N/A;  loop ileostomy takedown with EUA   ? KNEE ARTHROSCOPY  1998  ? Right knee  ? POUCHOSCOPY  11/16/2013  ? Procedure: POUCHOSCOPY;  Surgeon: Leighton Ruff, MD;  [facility]: [facility];  Service: Endoscopy;;  ? POUCHOSCOPY N/A 11/16/2015  ? Procedure: POUCHOSCOPY;  Surgeon: Leighton Ruff, MD;  [facility]: [facility];  Service: Endoscopy;  Laterality: N/A;  ? POUCHOSCOPY N/A 12/20/2016  ? Procedure: POUCHOSCOPY;  Surgeon: Leighton Ruff, MD;  Location: [facility];  Service: Endoscopy;  Laterality: N/A;  ? POUCHOSCOPY N/A 01/15/2018  ? Procedure: POUCHOSCOPY;  Surgeon: Leighton Ruff, MD;  Location: [facility];  Service: Endoscopy;  Laterality: N/A;  ? POUCHOSCOPY N/A 07/10/2019  ? Procedure: POUCHOSCOPY;  Surgeon: Leighton Ruff, MD;  Location: [facility];  Service: General;  Laterality: N/A;  ? POUCHOSCOPY N/A 01/27/2021  ? Procedure: POUCHOSCOPY;  Surgeon: Leighton Ruff, MD;  Location: [facility];  Service: Endoscopy;  Laterality: N/A;  ? THYROID LOBECTOMY  05/20/2012  ? Procedure: THYROID LOBECTOMY;  Surgeon: Adin Hector, MD;  Location: [facility];  Service: General;  Laterality: Left;  LEFT THYROID LOBECTOMY  ? UPPER GASTROINTESTINAL ENDOSCOPY    ? WISDOM TOOTH EXTRACTION    ? ? ?Current Outpatient Medications  ?Medication Instructions  ? acetaminophen (TYLENOL) 1,000 mg, Oral, Every 6 hours PRN  ? aspirin 81 mg, Oral, Daily  ? atorvastatin (LIPITOR) 20 MG tablet TAKE 1 TABLET(20 MG) BY MOUTH DAILY  ? Blood Glucose Monitoring Suppl (ONE TOUCH ULTRA SYSTEM KIT) W/DEVICE KIT Dispense 1 kit with 100 lancets and strips, with 3 rf on  lancets and strips.  Check sugar daily.  Dx E11.9  ? cholecalciferol (VITAMIN D) 1,000 Units, Daily  ? ferrous sulfate (FEROSUL) 325 mg, Oral, Daily  ? fluticasone (FLONASE) 50 MCG/ACT nasal spray USE 2 SPRAYS IN EACH NOSTRIL DAILY  ? glimepiride (AMARYL) 2 MG tablet TAKE 2 TABLETS BY MOUTH IN THE MORNING THEN TAKE 1 TABLET BY MOUTH IN THE EVENING  ? JARDIANCE 10 MG TABS tablet TAKE 1 TABLET BY MOUTH DAILY  ? levothyroxine (SYNTHROID) 100 MCG tablet TAKE 1 TABLET(100 MCG) BY MOUTH EVERY MORNING  ? loperamide (ANTI-DIARRHEAL) 2 MG tablet TAKE 1-2 TABLETS BY MOUTH TWICE DAILY AS NEEDED  ? loratadine (CLARITIN) 10 mg, Daily  ? metFORMIN (GLUCOPHAGE) 1000 MG tablet TAKE 2 TABLETS BY MOUTH DAILY  ? ONETOUCH ULTRA test strip USE TO TEST BLOOD SUGAR DAILY  ? TRULICITY 4.5 QQ/7.6PP SOPN INJECT 4.5 MG ONCE A WEEK AS DIRECTED  ? vitamin B-12 (CYANOCOBALAMIN) 500 mcg, Oral, 2 times weekly  ? ? ?Allergies as of 12/06/2021 - Review Complete 12/06/2021  ?Allergen Reaction Noted  ? Ibuprofen Swelling 05/22/2006  ? ? ?  Family History  ?Problem Relation Age of Onset  ? Uterine cancer Mother   ? Hypertension Mother   ? Stroke Mother   ? Colon cancer Mother   ? Colon polyps Mother   ? Cancer Mother   ?     colon, endometrial  ? Diabetes Father   ? Obesity Father   ? Pneumonia Father   ? Cancer Father   ?     skin - squamous   ? Breast cancer Sister   ? Diabetes Paternal Grandmother   ? Prostate cancer Neg Hx   ? ? ?Social History  ? ?Socioeconomic History  ? Marital status: Married  ?  Spouse name: June  ? Number of children: 0  ? Years of education: Not on file  ? Highest education level: Not on file  ?Occupational History  ? Occupation: bus driver  ?  Employer: [employer]  ?  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 Smoking status: Former  ?  Types: Cigarettes  ?  Quit date: 08/07/1991  ?  Years since quitting: 30.3  ? Smokeless tobacco: Never  ? Tobacco  comments:  ?  quit in 1993  ?Vaping Use  ? Vaping Use: Never used  ?Substance and Sexual Activity  ? Alcohol use: No  ? Drug use: No  ? Sexual activity: Not on file  ?Other Topics Concern  ? Not on file  ?Social

## 2021-12-08 ENCOUNTER — Other Ambulatory Visit (HOSPITAL_COMMUNITY): Payer: Self-pay

## 2021-12-11 ENCOUNTER — Telehealth: Payer: Self-pay | Admitting: Pharmacy Technician

## 2021-12-11 NOTE — Telephone Encounter (Signed)
Patient Advocate Encounter ?  ?Received notification from [facility] that prior authorization for Ozempic '2mg'$ /dose is due for renewal. ?  ?Pt no longer on med. Archived.  ? ?Key  [id number] ? ? ? ? ?

## 2021-12-14 ENCOUNTER — Telehealth: Payer: Self-pay | Admitting: Pharmacy Technician

## 2021-12-14 ENCOUNTER — Other Ambulatory Visit (HOSPITAL_COMMUNITY): Payer: Self-pay

## 2021-12-14 NOTE — Telephone Encounter (Signed)
Patient Advocate Encounter ?  ?Received notification from [facility] that prior authorization for Trulicity 4.'5mg'$ /0.58m is required by his/her insurance [employer]. ? ?Per Test Claim: Too soon to fill, no PA needed at this time.  ?  ? ?

## 2021-12-20 ENCOUNTER — Other Ambulatory Visit: Payer: Self-pay | Admitting: Internal Medicine

## 2021-12-20 DIAGNOSIS — N182 Chronic kidney disease, stage 2 (mild): Secondary | ICD-10-CM

## 2022-01-03 ENCOUNTER — Other Ambulatory Visit: Payer: Self-pay | Admitting: Family Medicine

## 2022-01-03 DIAGNOSIS — E1122 Type 2 diabetes mellitus with diabetic chronic kidney disease: Secondary | ICD-10-CM

## 2022-01-04 ENCOUNTER — Ambulatory Visit: Payer: BC Managed Care – PPO | Admitting: Internal Medicine

## 2022-01-05 ENCOUNTER — Other Ambulatory Visit (INDEPENDENT_AMBULATORY_CARE_PROVIDER_SITE_OTHER): Payer: BC Managed Care – PPO

## 2022-01-05 DIAGNOSIS — D649 Anemia, unspecified: Secondary | ICD-10-CM

## 2022-01-05 DIAGNOSIS — E1122 Type 2 diabetes mellitus with diabetic chronic kidney disease: Secondary | ICD-10-CM | POA: Diagnosis not present

## 2022-01-05 DIAGNOSIS — N182 Chronic kidney disease, stage 2 (mild): Secondary | ICD-10-CM | POA: Diagnosis not present

## 2022-01-05 DIAGNOSIS — E538 Deficiency of other specified B group vitamins: Secondary | ICD-10-CM | POA: Diagnosis not present

## 2022-01-05 LAB — CBC WITH DIFFERENTIAL/PLATELET
Basophils Absolute: 0 10*3/uL (ref 0.0–0.1)
Basophils Relative: 0.7 % (ref 0.0–3.0)
Eosinophils Absolute: 0.2 10*3/uL (ref 0.0–0.7)
Eosinophils Relative: 3.4 % (ref 0.0–5.0)
HCT: 39.4 % (ref 39.0–52.0)
Hemoglobin: 13.2 g/dL (ref 13.0–17.0)
Lymphocytes Relative: 34.2 % (ref 12.0–46.0)
Lymphs Abs: 1.7 10*3/uL (ref 0.7–4.0)
MCHC: 33.6 g/dL (ref 30.0–36.0)
MCV: 90.3 fl (ref 78.0–100.0)
Monocytes Absolute: 0.4 10*3/uL (ref 0.1–1.0)
Monocytes Relative: 7.4 % (ref 3.0–12.0)
Neutro Abs: 2.8 10*3/uL (ref 1.4–7.7)
Neutrophils Relative %: 54.3 % (ref 43.0–77.0)
Platelets: 221 10*3/uL (ref 150.0–400.0)
RBC: 4.36 Mil/uL (ref 4.22–5.81)
RDW: 14.4 % (ref 11.5–15.5)
WBC: 5.1 10*3/uL (ref 4.0–10.5)

## 2022-01-05 LAB — HEMOGLOBIN A1C: Hgb A1c MFr Bld: 11.1 % — ABNORMAL HIGH (ref 4.6–6.5)

## 2022-01-05 LAB — IRON: Iron: 49 ug/dL (ref 42–165)

## 2022-01-05 LAB — VITAMIN B12: Vitamin B-12: 523 pg/mL (ref 211–911)

## 2022-01-08 ENCOUNTER — Encounter: Payer: Self-pay | Admitting: Family Medicine

## 2022-01-08 ENCOUNTER — Other Ambulatory Visit (HOSPITAL_COMMUNITY): Payer: Self-pay

## 2022-01-08 ENCOUNTER — Ambulatory Visit (INDEPENDENT_AMBULATORY_CARE_PROVIDER_SITE_OTHER): Payer: BC Managed Care – PPO | Admitting: Family Medicine

## 2022-01-08 VITALS — BP 130/64 | HR 65 | Temp 97.6°F | Ht 71.0 in | Wt 228.0 lb

## 2022-01-08 DIAGNOSIS — Z0000 Encounter for general adult medical examination without abnormal findings: Secondary | ICD-10-CM

## 2022-01-08 DIAGNOSIS — E538 Deficiency of other specified B group vitamins: Secondary | ICD-10-CM

## 2022-01-08 DIAGNOSIS — E1122 Type 2 diabetes mellitus with diabetic chronic kidney disease: Secondary | ICD-10-CM

## 2022-01-08 DIAGNOSIS — Z7189 Other specified counseling: Secondary | ICD-10-CM

## 2022-01-08 DIAGNOSIS — E7800 Pure hypercholesterolemia, unspecified: Secondary | ICD-10-CM

## 2022-01-08 DIAGNOSIS — E890 Postprocedural hypothyroidism: Secondary | ICD-10-CM

## 2022-01-08 NOTE — Progress Notes (Unsigned)
CPE- See plan.  Routine anticipatory guidance given to patient.  See health maintenance.  The possibility exists that previously documented standard health maintenance information may have been brought forward from a previous encounter into this note.  If needed, that same information has been updated to reflect the current situation based on today's encounter.    covid vaccine prev done.   Tetanus 2020 Flu done prev done PNA 2014 Shingles prev done.  J pouch eval by Dr. Marcello Moores 2022- he is going to f/u 2023 Prostate cancer screening and PSA options (with potential risks and benefits of testing vs not testing) were discussed along with recent recs/guidelines.  He declined testing PSA at this point. Living will d/w pt.  Wife designated if patient were incapacitated.   Diet and exercise d/w pt.  HIV and HCV prev checked 2017.    He is going to f/u with GI 2023.    Diabetes:  Using medications without difficulties: yes Hypoglycemic episodes:no Hyperglycemic episodes:no Feet problems: no Blood Sugars averaging: up to 160s eye exam within last year: yes Fructosamine pending at [place].   Labs d/w pt.   He may have divergence between his A1c and fructosamine.  See notes on labs.  Elevated Cholesterol: Using medications without problems: yes Muscle aches: no Diet compliance: yes Exercise:yes, d/w pt about biking.   Still on iron daily.  Recent labs d/w pt.    Hypothyroidism.  TSh wnl.  Compliant.  No dysphagia.    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    PMH and SH reviewed  Meds, vitals, and allergies reviewed.   ROS: Per HPI.  Unless specifically indicated otherwise in HPI, the patient denies:  General: fever. Eyes: acute vision changes ENT: sore throat Cardiovascular: chest pain Respiratory: SOB GI: vomiting GU: dysuria Musculoskeletal: acute back pain Derm: acute rash Neuro: acute motor dysfunction Psych: worsening mood Endocrine:  polydipsia Heme: bleeding Allergy: hayfever  GEN: nad, alert and oriented HEENT: mucous membranes moist NECK: supple w/o LA CV: rrr. PULM: ctab, no inc wob ABD: soft, +bs EXT: no edema SKIN: no acute rash Varicose veins noted   Diabetic foot exam: Normal inspection No skin breakdown No calluses  Normal DP pulses Normal sensation to light touch and monofilament Nails mildly thickened.

## 2022-01-08 NOTE — Patient Instructions (Addendum)
Update me as needed.  Take care.  Glad to see you. Plan on recheck in about 6 months.  Labs ahead of time if possible.   We'll update you about the fructosamine when I get the results.

## 2022-01-09 LAB — FRUCTOSAMINE: Fructosamine: 370 umol/L — ABNORMAL HIGH (ref 205–285)

## 2022-01-10 NOTE — Assessment & Plan Note (Signed)
TSH normal.  Continue levothyroxine as is.

## 2022-01-10 NOTE — Assessment & Plan Note (Signed)
B12 level is normal.  Would continue as is.

## 2022-01-10 NOTE — Assessment & Plan Note (Signed)
Fructosamine pending at [place].   Labs d/w pt.   He may have divergence between his A1c and fructosamine.  See notes on labs. Continue glimepiride Jardiance metformin and Trulicity.

## 2022-01-10 NOTE — Assessment & Plan Note (Signed)
covid vaccine prev done.   Tetanus 2020 Flu done prev done PNA 2014 Shingles prev done.  J pouch eval by Dr. Marcello Moores 2022- he is going to f/u 2023 Prostate cancer screening and PSA options (with potential risks and benefits of testing vs not testing) were discussed along with recent recs/guidelines.  He declined testing PSA at this point. Living will d/w pt.  Wife designated if patient were incapacitated.   Diet and exercise d/w pt.  HIV and HCV prev checked 2017.

## 2022-01-10 NOTE — Assessment & Plan Note (Signed)
Continue atorvastatin

## 2022-01-10 NOTE — Assessment & Plan Note (Signed)
Living will d/w pt.  Wife designated if patient were incapacitated.   ?

## 2022-01-23 ENCOUNTER — Telehealth: Payer: Self-pay

## 2022-01-23 NOTE — Telephone Encounter (Signed)
Pouchoscopy scheduled, pt instructed and medications reviewed.  Patient instructions mailed to home.  Patient to call with any questions or concerns.

## 2022-01-23 NOTE — Telephone Encounter (Signed)
-----   Message from Milus Banister, MD sent at 01/23/2022  6:45 AM EDT ----- Regarding: RE: FAP pt Cory Harper, I am happy to do this.  I can plan on doing it annually from here on out as well.  Keyion Knack, Can you contact the patient and let him know that Dr. Marcello Moores and I have discussed this.  Please add on pouchoscopy (prep as a flex sig) for his July 3 case in the [facility].  It looks like I still have 1 spot available that morning.  Please reorganize the schedule as appropriate so that I am not double booked.  Thanks    ----- Message ----- From: Leighton Ruff, MD Sent: 1/75/1025  11:27 AM EDT To: Milus Banister, MD Subject: FAP pt                                         Dan,  it looks like you're going to do an EGD on this patient for follow-up due to FAP.  I have been doing yearly pouchoscopy's on him.  Would you be comfortable doing his pouchoscopy at the same time?  He usually has a few polyps that I resect each time and they have all returned as tubular adenomas.  Cory Harper

## 2022-01-23 NOTE — Telephone Encounter (Signed)
Pouchoscopy has been added to July 3 case.  Cory Harper has been advised.  I have blocked the one spot on the July 3 case so that Dr Ardis Hughs will not be double booked.   Left message on machine to call back

## 2022-01-29 ENCOUNTER — Other Ambulatory Visit: Payer: Self-pay | Admitting: Family Medicine

## 2022-01-30 ENCOUNTER — Encounter: Payer: Self-pay | Admitting: Gastroenterology

## 2022-02-05 ENCOUNTER — Ambulatory Visit (AMBULATORY_SURGERY_CENTER): Payer: BC Managed Care – PPO | Admitting: Gastroenterology

## 2022-02-05 ENCOUNTER — Encounter: Payer: Self-pay | Admitting: Gastroenterology

## 2022-02-05 VITALS — BP 130/64 | HR 82 | Temp 98.0°F | Resp 18 | Ht 71.0 in | Wt 229.0 lb

## 2022-02-05 DIAGNOSIS — K208 Other esophagitis without bleeding: Secondary | ICD-10-CM | POA: Diagnosis not present

## 2022-02-05 DIAGNOSIS — K6289 Other specified diseases of anus and rectum: Secondary | ICD-10-CM

## 2022-02-05 DIAGNOSIS — Z08 Encounter for follow-up examination after completed treatment for malignant neoplasm: Secondary | ICD-10-CM | POA: Diagnosis not present

## 2022-02-05 DIAGNOSIS — D126 Benign neoplasm of colon, unspecified: Secondary | ICD-10-CM | POA: Diagnosis not present

## 2022-02-05 DIAGNOSIS — D128 Benign neoplasm of rectum: Secondary | ICD-10-CM

## 2022-02-05 DIAGNOSIS — D132 Benign neoplasm of duodenum: Secondary | ICD-10-CM

## 2022-02-05 DIAGNOSIS — Z85038 Personal history of other malignant neoplasm of large intestine: Secondary | ICD-10-CM | POA: Diagnosis not present

## 2022-02-05 DIAGNOSIS — D1339 Benign neoplasm of other parts of small intestine: Secondary | ICD-10-CM | POA: Diagnosis not present

## 2022-02-05 MED ORDER — SODIUM CHLORIDE 0.9 % IV SOLN: 500.0000 mL | INTRAVENOUS | Status: DC

## 2022-02-05 NOTE — Op Note (Signed)
[place] Patient Name: Cory Harper Procedure Date: 02/05/2022 7:42 AM MRN: [id number] Endoscopist: Milus Banister , MD Age: 66 Referring MD:  Date of Birth: [birth year] Gender: Male Account #: [id number] Procedure:                Upper GI endoscopy Indications:              Familial Polyposis; Duodenal adenomas: EGD                            9/2013Dr. Ardis Hughs for UGI tract screening in FAP                            found abnormal duodenal mucosa, The biopsies of his                            upper intestines showed mild villous atrophy.                            Celiac Panel was equivocal.EGD Dr. Ardis Hughs                            2014abnormal musosa in duodenum, + adenomatous.                            He was referred to [facility] for endoscopic treatments,                            resection. Care at [facility] Dr. Lysle Rubens included EGDs                            2014, 2015, 2016, 2017. Then eventually EGD 2018                            Dr. Ardis Hughs: found recurrent adenomatuos mucosa at                            the major papilla and referred back to [facility] for                            further care. Care at [facility] for this, Dr. Lysle Rubens with                            ERCP 2018, then EGD 2019, last EGD I can see in                            epic was 02/2020 (Dr. Lysle Rubens and a Fellow) they                            describe "a few 2 mm mucosal nodules with a diffuse                            distribution were found in the first portion of the  duodenum. Biopsies were taken with cold forceps"                            The major papilla was normal. And biopsies showed                            no sign of residual adenoma Medicines:                Monitored Anesthesia Care Procedure:                Pre-Anesthesia Assessment:                           - Prior to the procedure, a History and Physical                            was performed, and patient  medications and                            allergies were reviewed. The patient's tolerance of                            previous anesthesia was also reviewed. The risks                            and benefits of the procedure and the sedation                            options and risks were discussed with the patient.                            All questions were answered, and informed consent                            was obtained. Prior Anticoagulants: The patient has                            taken no previous anticoagulant or antiplatelet                            agents. ASA Grade Assessment: II - A patient with                            mild systemic disease. After reviewing the risks                            and benefits, the patient was deemed in                            satisfactory condition to undergo the procedure.                           After obtaining informed consent, the endoscope was  passed under direct vision. Throughout the                            procedure, the patient's blood pressure, pulse, and                            oxygen saturations were monitored continuously. The                            Endoscope was introduced through the mouth, and                            advanced to the second part of duodenum. The upper                            GI endoscopy was accomplished without difficulty.                            The patient tolerated the procedure well. Scope In: Scope Out: Findings:                 Small, soft, inflammed nodule just below the GE                            junction. This measured 7-55m across and I sampled                            it with biopsy forceps.                           The exam was otherwise without abnormality. Complications:            No immediate complications. Estimated blood loss:                            None. Estimated Blood Loss:     Estimated blood loss:  none. Impression:               - Small, soft, inflammed nodule just below the GE                            junction. This measured 7-822macross and I sampled                            it with biopsy forceps.                           - The examination was otherwise normal. Recommendation:           - Await pathology results.                           - Pouchoscopy now. DaMilus BanisterMD 02/05/2022 8:11:01 AM This report has been signed electronically.

## 2022-02-05 NOTE — Op Note (Signed)
[place] Patient Name: Cory Harper Procedure Date: 02/05/2022 7:42 AM MRN: [id number] Endoscopist: Milus Banister , MD Age: 66 Referring MD:  Date of Birth: 04-Sep-1955 Gender: Male Account #: [id number] Procedure:                Flexible Sigmoidoscopy Indications:              Stage II (T3 N0) moderately differentiated                            adenocarcinoma of the cecum, status post a total                            proctocolectomy on 05/20/2012; IPAA J pouch; taken                            down 2013 late. Followed with serial pouchoscopies                            with Dr. Leighton Ruff, last was 2022 when a few                            small adenomas were found and removed from the                            pouch. Medicines:                Monitored Anesthesia Care Procedure:                Pre-Anesthesia Assessment:                           - Prior to the procedure, a History and Physical                            was performed, and patient medications and                            allergies were reviewed. The patient's tolerance of                            previous anesthesia was also reviewed. The risks                            and benefits of the procedure and the sedation                            options and risks were discussed with the patient.                            All questions were answered, and informed consent                            was obtained. Prior Anticoagulants: The patient has  taken no previous anticoagulant or antiplatelet                            agents. ASA Grade Assessment: II - A patient with                            mild systemic disease. After reviewing the risks                            and benefits, the patient was deemed in                            satisfactory condition to undergo the procedure.                           - Prior to the procedure, a History and Physical                             was performed, and patient medications and                            allergies were reviewed. The patient's tolerance of                            previous anesthesia was also reviewed. The risks                            and benefits of the procedure and the sedation                            options and risks were discussed with the patient.                            All questions were answered, and informed consent                            was obtained. Prior Anticoagulants: The patient has                            taken no previous anticoagulant or antiplatelet                            agents. ASA Grade Assessment: II - A patient with                            mild systemic disease. After reviewing the risks                            and benefits, the patient was deemed in                            satisfactory condition to undergo the procedure.  After obtaining informed consent, the scope was                            passed under direct vision. The Endoscope was                            introduced through the anus and advanced to the the                            ileo-rectal anastomosis. The flexible sigmoidoscopy                            was accomplished without difficulty. The patient                            tolerated the procedure well. The quality of the                            bowel preparation was good. Scope In: 8:00:26 AM Scope Out: 8:07:14 AM Total Procedure Duration: 0 hours 6 minutes 48 seconds  Findings:                 Mutliple sessile polyps were found in the J pouch,                            probably 30-40. These measured 3-30m, none appeared                            malignant. I sampled the polyps with cold snare                            polypectomy and sent to pathology.                           The IPAA (anal) anastomosis was slightly inflamed                            appearing , not  clearly neoplastic however I took                            mucosa forcep biopsies to be safe. Complications:            No immediate complications. Estimated blood loss:                            None. Estimated Blood Loss:     Estimated blood loss: none. Impression:               - Mutliple sessile polyps were found in the J                            pouch, probably 30-40. These measured 3-9103m none                            appeared malignant. I  sampled the polyps with cold                            snare polypectomy and sent to pathology.                           - The IPAA (anal) anastomosis was slightly inflamed                            appearing , not clearly neoplastic however I took                            mucosa forcep biopsies to be safe. Recommendation:           - Discharge patient to home.                           - Await pathology results. Milus Banister, MD 02/05/2022 8:19:50 AM This report has been signed electronically.

## 2022-02-05 NOTE — Progress Notes (Signed)
Review of pertinent gastrointestinal problems: 1. 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 Followed with serial pouchoscopies with Dr. Leighton Ruff, last was 2022 2. Familial polyposis with multiple adenomatous polyps noted on the colectomy specimen 05/20/2012; originally he was diagnosed with FAP around year 2000 by Dr. Fuller Plan but he was abstinent and declined surgical recommendations that were spoken to him and also put in writing. 3.  Duodenal adenomas: EGD 04/2012 Dr. Ardis Hughs for UGI tract screening in FAP found abnormal duodenal mucosa, The biopsies of his upper intestines showed mild villous atrophy.  Celiac Panel was equivocal. EGD Dr. Ardis Hughs 2014 abnormal musosa in duodenum, + adenomatous.  He was referred to [facility] for endoscopic treatments, resection. Care at [facility] Dr. Lysle Rubens included EGDs 2014, 2015, 2016, 2017. Then eventually EGD 2018 Dr. Ardis Hughs: found recurrent adenomatuos mucosa at the major papilla and referred back to [facility] for further care. Care at [facility] for this, Dr. Lysle Rubens with ERCP 2018, then EGD 2019, last EGD I can see in epic was 02/2020 (Dr. Lysle Rubens and a Fellow) they describe "a few 2 mm mucosal nodules with a diffuse distribution were found in the first portion of the duodenum.  Biopsies were taken with cold forceps"  The major papilla was normal.  And biopsies showed no sign of residual adenoma.   HPI: This is a man with familial polyposis syndrome   ROS: complete GI ROS as described in HPI, all other review negative.  Constitutional:  No unintentional weight loss   Past Medical History:  Diagnosis Date   Anemia    Arthritis    Cancer ([place]) 2013   stg II colon cancer s/p resection   Colonic mass    Diabetes mellitus    FAP (familial adenomatous polyposis)    Heart murmur    Hyperlipidemia    Hypertension    not on blood pressure meds since 11/13     Hypothyroidism    Nasal congestion     Past Surgical History:  Procedure Laterality Date   BIOPSY  01/15/2018   Procedure: BIOPSY;  Surgeon: Leighton Ruff, MD;  Location: [facility];  Service: Endoscopy;;   BIOPSY  07/10/2019   Procedure: BIOPSY;  Surgeon: Leighton Ruff, MD;  Location: [facility];  Service: General;;   BIOPSY  01/27/2021   Procedure: BIOPSY;  Surgeon: Leighton Ruff, MD;  Location: [facility];  Service: Endoscopy;;   BOWEL RESECTION  05/20/2012   COLON RESECTION  05/20/2012   Procedure: COLON RESECTION LAPAROSCOPIC;  Surgeon: Adin Hector, MD;  Location: [facility];  Service: General;  Laterality: N/A;  Laparoscopic Proctocolectomy, Ileal Pouch Anal Anastomoisis, Loop Ileostomy   ESOPHAGOGASTRODUODENOSCOPY (EGD) WITH PROPOFOL N/A 04/23/2013   Procedure: ESOPHAGOGASTRODUODENOSCOPY (EGD) WITH PROPOFOL;  Surgeon: Milus Banister, MD;  [facility];  Service: Endoscopy;  Laterality: N/A;  side viewing scope   FLEXIBLE SIGMOIDOSCOPY  08/01/2012   Procedure: FLEXIBLE SIGMOIDOSCOPY;  Surgeon: Leighton Ruff, MD;  [facility];  Service: Endoscopy;  Laterality: N/A;  use endoscope   FLEXIBLE SIGMOIDOSCOPY N/A 11/29/2014   Procedure: FLEXIBLE SIGMOIDOSCOPY/POUCHOSCOPY;  Surgeon: Leighton Ruff, MD;  [facility]: [facility];  Service: Endoscopy;  Laterality: N/A;   ILEOSTOMY  05/20/12   ILEOSTOMY CLOSURE N/A 09/18/2012   Procedure: Loop Ileostomy Takedown with EUA ;  Surgeon: Adin Hector, MD;  Location: [facility];  Service: General;  Laterality: N/A;  loop ileostomy takedown with EUA  KNEE ARTHROSCOPY  1998   Right knee   POUCHOSCOPY  11/16/2013   Procedure: POUCHOSCOPY;  Surgeon: Leighton Ruff, MD;  Location: [facility];  Service: Endoscopy;;   POUCHOSCOPY N/A 11/16/2015   Procedure: POUCHOSCOPY;  Surgeon: Leighton Ruff, MD;  [facility];  Service: Endoscopy;  Laterality: N/A;   POUCHOSCOPY N/A 12/20/2016   Procedure: POUCHOSCOPY;  Surgeon:  Leighton Ruff, MD;  [facility];  Service: Endoscopy;  Laterality: N/A;   POUCHOSCOPY N/A 01/15/2018   Procedure: POUCHOSCOPY;  Surgeon: Leighton Ruff, MD;  [facility];  Service: Endoscopy;  Laterality: N/A;   POUCHOSCOPY N/A 07/10/2019   Procedure: POUCHOSCOPY;  Surgeon: Leighton Ruff, MD;  Location: [facility];  Service: General;  Laterality: N/A;   POUCHOSCOPY N/A 01/27/2021   Procedure: POUCHOSCOPY;  Surgeon: Leighton Ruff, MD;  [facility];  Service: Endoscopy;  Laterality: N/A;   THYROID LOBECTOMY  05/20/2012   Procedure: THYROID LOBECTOMY;  Surgeon: Adin Hector, MD;  Location: [facility];  Service: General;  Laterality: Left;  LEFT THYROID LOBECTOMY   UPPER GASTROINTESTINAL ENDOSCOPY     WISDOM TOOTH EXTRACTION      Current Outpatient Medications  Medication Sig Dispense Refill   aspirin 81 MG tablet Take 81 mg by mouth daily.     atorvastatin (LIPITOR) 20 MG tablet TAKE 1 TABLET(20 MG) BY MOUTH DAILY 90 tablet 3   cholecalciferol (VITAMIN D) 1000 UNITS tablet Take 1,000 Units by mouth daily.      fluticasone (FLONASE) 50 MCG/ACT nasal spray USE 2 SPRAYS IN EACH NOSTRIL DAILY 16 g 5   glimepiride (AMARYL) 2 MG tablet TAKE 2 TABLETS BY MOUTH IN THE MORNING THEN TAKE 1 TABLET BY MOUTH IN THE EVENING 270 tablet 3   JARDIANCE 10 MG TABS tablet TAKE 1 TABLET BY MOUTH DAILY 90 tablet 3   levothyroxine (SYNTHROID) 100 MCG tablet TAKE 1 TABLET(100 MCG) BY MOUTH EVERY MORNING 90 tablet 3   loperamide (ANTI-DIARRHEAL) 2 MG tablet TAKE 1-2 TABLETS BY MOUTH TWICE DAILY AS NEEDED 360 tablet 3   loratadine (CLARITIN) 10 MG tablet Take 10 mg by mouth daily.      metFORMIN (GLUCOPHAGE) 1000 MG tablet TAKE 2 TABLETS BY MOUTH DAILY 180 tablet 3   vitamin B-12 (CYANOCOBALAMIN) 500 MCG tablet Take 1 tablet (500 mcg total) by mouth 2 (two) times a week.     acetaminophen (TYLENOL) 500 MG tablet Take 1,000 mg by mouth every 6 (six) hours as needed for moderate pain.  (Patient not taking: Reported on 02/05/2022)     Blood Glucose Monitoring Suppl (ONE TOUCH ULTRA SYSTEM KIT) W/DEVICE KIT Dispense 1 kit with 100 lancets and strips, with 3 rf on lancets and strips.  Check sugar daily.  Dx E11.9 1 each 0   FEROSUL 325 (65 Fe) MG tablet TAKE 1 TABLET(325 MG) BY MOUTH TWICE DAILY 180 tablet 1   ONETOUCH ULTRA test strip USE TO TEST BLOOD SUGAR DAILY 545 strip 2   TRULICITY 4.5 GY/5.6LS SOPN INJECT 4.5MG ONCE A WEEK AS DIRECTED 6 mL 0   Current Facility-Administered Medications  Medication Dose Route Frequency Provider Last Rate Last Admin   0.9 %  sodium chloride infusion  500 mL Intravenous Continuous Milus Banister, MD        Allergies as of 02/05/2022 - Review Complete 02/05/2022  Allergen Reaction Noted   Ibuprofen Swelling 05/22/2006    Family History  Problem Relation Age of Onset   Uterine cancer Mother  Hypertension Mother    Stroke Mother    Colon cancer Mother    Colon polyps Mother    Cancer Mother        colon, endometrial   Diabetes Father    Obesity Father    Pneumonia Father    Cancer Father        skin - squamous    Breast cancer Sister    Diabetes Paternal Grandmother    Prostate cancer Neg Hx    Esophageal cancer Neg Hx     Social History   Socioeconomic History   Marital status: Married    Spouse name: June   Number of children: 0   Years of education: Not on file   Highest education level: Not on file  Occupational History   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[place]: 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, 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  Tobacco Use   Smoking status: Former    Types: Cigarettes    Quit date: 08/07/1991    Years since quitting: 30.5   Smokeless tobacco: Never   Tobacco comments:    quit in 1993  Vaping Use   Vaping Use: Never used  Substance and Sexual Activity   Alcohol use: No   Drug use: No   Sexual activity: Not on file  Other Topics Concern    Not on file  Social History Narrative   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[employer])   Recruitment consultant for [employer]   Married 1996   No kids   Social Determinants of Radio broadcast assistant Strain: Not on file  Food Insecurity: Not on file  Transportation Needs: Not on file  Physical Activity: Not on file  Stress: Not on file  Social Connections: Not on file  Intimate Partner Violence: Not on file     Physical Exam: BP 131/74   Pulse 87   Temp 98 F (36.7 C)   Ht _0  (1.803 m)   Wt 229 lb (103.9 kg)   SpO2 97%   BMI 31.94 kg/m  Constitutional: generally well-appearing Psychiatric: alert and oriented x3 Lungs: CTA bilaterally Heart: no MCR  Assessment and plan: 66 y.o. male with familial polyposis syndrome  EGD and pouchoscopy today  Care is appropriate for the ambulatory setting.  Owens Loffler, MD [facility] 02/05/2022, 7:24 AM

## 2022-02-05 NOTE — Progress Notes (Signed)
Called to room to assist during endoscopic procedure.  Patient ID and intended procedure confirmed with present staff. Received instructions for my participation in the procedure from the performing physician.  

## 2022-02-05 NOTE — Patient Instructions (Signed)
Discharge instructions given. Handout on polyps. Biopsies taken. Resume previous medications. YOU HAD AN ENDOSCOPIC PROCEDURE TODAY AT [facility]:   Refer to the procedure report that was given to you for any specific questions about what was found during the examination.  If the procedure report does not answer your questions, please call your gastroenterologist to clarify.  If you requested that your care partner not be given the details of your procedure findings, then the procedure report has been included in a sealed envelope for you to review at your convenience later.  YOU SHOULD EXPECT: Some feelings of bloating in the abdomen. Passage of more gas than usual.  Walking can help get rid of the air that was put into your GI tract during the procedure and reduce the bloating. If you had a lower endoscopy (such as a colonoscopy or flexible sigmoidoscopy) you may notice spotting of blood in your stool or on the toilet paper. If you underwent a bowel prep for your procedure, you may not have a normal bowel movement for a few days.  Please Note:  You might notice some irritation and congestion in your nose or some drainage.  This is from the oxygen used during your procedure.  There is no need for concern and it should clear up in a day or so.  SYMPTOMS TO REPORT IMMEDIATELY:  Following lower endoscopy (colonoscopy or flexible sigmoidoscopy):  Excessive amounts of blood in the stool  Significant tenderness or worsening of abdominal pains  Swelling of the abdomen that is new, acute  Fever of 100F or higher  Following upper endoscopy (EGD)  Vomiting of blood or coffee ground material  New chest pain or pain under the shoulder blades  Painful or persistently difficult swallowing  New shortness of breath  Fever of 100F or higher  Black, tarry-looking stools  For urgent or emergent issues, a gastroenterologist can be reached at any hour by calling [phone]. Do not use  MyChart messaging for urgent concerns.    DIET:  We do recommend a small meal at first, but then you may proceed to your regular diet.  Drink plenty of fluids but you should avoid alcoholic beverages for 24 hours.  ACTIVITY:  You should plan to take it easy for the rest of today and you should NOT DRIVE or use heavy machinery until tomorrow (because of the sedation medicines used during the test).    FOLLOW UP: Our staff will call the number listed on your records the next business day following your procedure.  We will call around 7:15- 8:00 am to check on you and address any questions or concerns that you may have regarding the information given to you following your procedure. If we do not reach you, we will leave a message.  If you develop any symptoms (ie: fever, flu-like symptoms, shortness of breath, cough etc.) before then, please call [phone].  If you test positive for Covid 19 in the 2 weeks post procedure, please call and report this information to [place].    If any biopsies were taken you will be contacted by phone or by letter within the next 1-3 weeks.  Please call us at [phone] if you have not heard about the biopsies in 3 weeks.    SIGNATURES/CONFIDENTIALITY: You and/or your care partner have signed paperwork which will be entered into your electronic medical record.  These signatures attest to the fact that that the information above on your After Visit Summary has  been reviewed and is understood.  Full responsibility of the confidentiality of this discharge information lies with you and/or your care-partner.  

## 2022-02-05 NOTE — Progress Notes (Signed)
Report to pacu rn; vss. Care resumed by pacu rn.

## 2022-02-07 ENCOUNTER — Telehealth: Payer: Self-pay

## 2022-02-07 NOTE — Telephone Encounter (Signed)
  Follow up Call-     02/05/2022    7:10 AM  Call back number  Post procedure Call Back phone  # [phone]  Permission to leave phone message Yes     Patient questions:  Do you have a fever, pain , or abdominal swelling? No. Pain Score  0 *  Have you tolerated food without any problems? Yes.    Have you been able to return to your normal activities? Yes.    Do you have any questions about your discharge instructions: Diet   No. Medications  No. Follow up visit  No.  Do you have questions or concerns about your Care? No.  Actions: * If pain score is 4 or above: No action needed, pain <4.

## 2022-02-09 ENCOUNTER — Encounter: Payer: BC Managed Care – PPO | Admitting: Gastroenterology

## 2022-02-15 ENCOUNTER — Telehealth: Payer: Self-pay | Admitting: Gastroenterology

## 2022-02-15 ENCOUNTER — Other Ambulatory Visit: Payer: Self-pay

## 2022-02-15 DIAGNOSIS — D132 Benign neoplasm of duodenum: Secondary | ICD-10-CM

## 2022-02-16 NOTE — Telephone Encounter (Signed)
See alternate note  

## 2022-03-07 ENCOUNTER — Other Ambulatory Visit: Payer: Self-pay | Admitting: Family Medicine

## 2022-03-12 ENCOUNTER — Telehealth: Payer: Self-pay

## 2022-03-12 NOTE — Telephone Encounter (Signed)
Patient Advocate Encounter   Received notification from [facility] that prior authorization is required for Trulicity 4.5  Submitted: 03/12/22 Key [id number] Status is pending  Clista Bernhardt, CPhT Rx Patient Advocate Specialist Phone: [phone]

## 2022-03-13 ENCOUNTER — Other Ambulatory Visit (HOSPITAL_COMMUNITY): Payer: Self-pay

## 2022-03-15 ENCOUNTER — Telehealth: Payer: Self-pay

## 2022-03-15 ENCOUNTER — Other Ambulatory Visit (HOSPITAL_COMMUNITY): Payer: Self-pay

## 2022-03-15 NOTE — Telephone Encounter (Signed)
Patient Advocate Encounter  Prior Authorization for Trulicity 4.'5mg'$ /0.18m has been approved.   Effective dates not yet provided; test claim confirms pharmacy has filled medication.  SClista Bernhardt CPhT Rx Patient Advocate Specialist Phone: [phone]

## 2022-03-15 NOTE — Telephone Encounter (Signed)
Flex rescheduled to 10/5 with GM at 12 noon at [facility]  He has been re-instructed and all information sent to My Chart and mailed.

## 2022-03-15 NOTE — Telephone Encounter (Signed)
Inbound call from patient inquiring about rescheduled appointment. Please give a call back to further advise.  Thank you

## 2022-03-19 NOTE — Telephone Encounter (Signed)
The pt wanted to confirm that his appt is on Thursday 10/5.  He is asking if we have any cancellations to call.  Pt advised we will call if there are any sooner appts.

## 2022-04-06 LAB — HM DIABETES EYE EXAM

## 2022-05-03 ENCOUNTER — Encounter (HOSPITAL_COMMUNITY): Payer: Self-pay | Admitting: Gastroenterology

## 2022-05-04 ENCOUNTER — Ambulatory Visit: Payer: BC Managed Care – PPO | Admitting: Internal Medicine

## 2022-05-10 ENCOUNTER — Encounter (HOSPITAL_COMMUNITY): Payer: Self-pay | Admitting: Gastroenterology

## 2022-05-10 ENCOUNTER — Ambulatory Visit (HOSPITAL_COMMUNITY): Payer: BC Managed Care – PPO | Admitting: Certified Registered"

## 2022-05-10 ENCOUNTER — Ambulatory Visit (HOSPITAL_COMMUNITY)
Admission: RE | Admit: 2022-05-10 | Discharge: 2022-05-10 | Disposition: A | Discharge status: Home / Self Care | Payer: BC Managed Care – PPO | Source: Ambulatory Visit | Attending: Gastroenterology | Admitting: Gastroenterology

## 2022-05-10 ENCOUNTER — Other Ambulatory Visit: Payer: Self-pay

## 2022-05-10 ENCOUNTER — Encounter (HOSPITAL_COMMUNITY)
Admission: RE | Disposition: A | Discharge status: Home / Self Care | Payer: Self-pay | Source: Ambulatory Visit | Attending: Gastroenterology

## 2022-05-10 DIAGNOSIS — K6289 Other specified diseases of anus and rectum: Secondary | ICD-10-CM | POA: Insufficient documentation

## 2022-05-10 DIAGNOSIS — X58XXXA Exposure to other specified factors, initial encounter: Secondary | ICD-10-CM | POA: Insufficient documentation

## 2022-05-10 DIAGNOSIS — T183XXA Foreign body in small intestine, initial encounter: Secondary | ICD-10-CM | POA: Diagnosis not present

## 2022-05-10 DIAGNOSIS — Z86010 Personal history of colonic polyps: Secondary | ICD-10-CM | POA: Diagnosis not present

## 2022-05-10 DIAGNOSIS — K649 Unspecified hemorrhoids: Secondary | ICD-10-CM | POA: Insufficient documentation

## 2022-05-10 DIAGNOSIS — Z9049 Acquired absence of other specified parts of digestive tract: Secondary | ICD-10-CM | POA: Diagnosis not present

## 2022-05-10 DIAGNOSIS — D1330 Benign neoplasm of unspecified part of small intestine: Secondary | ICD-10-CM | POA: Insufficient documentation

## 2022-05-10 DIAGNOSIS — D1391 Familial adenomatous polyposis: Secondary | ICD-10-CM | POA: Diagnosis not present

## 2022-05-10 DIAGNOSIS — Z980 Intestinal bypass and anastomosis status: Secondary | ICD-10-CM | POA: Diagnosis not present

## 2022-05-10 DIAGNOSIS — D132 Benign neoplasm of duodenum: Secondary | ICD-10-CM

## 2022-05-10 DIAGNOSIS — Z87891 Personal history of nicotine dependence: Secondary | ICD-10-CM | POA: Diagnosis not present

## 2022-05-10 DIAGNOSIS — Z09 Encounter for follow-up examination after completed treatment for conditions other than malignant neoplasm: Secondary | ICD-10-CM | POA: Diagnosis present

## 2022-05-10 DIAGNOSIS — D1339 Benign neoplasm of other parts of small intestine: Secondary | ICD-10-CM | POA: Diagnosis not present

## 2022-05-10 HISTORY — PX: POLYPECTOMY: SHX5525

## 2022-05-10 HISTORY — PX: FLEXIBLE SIGMOIDOSCOPY: SHX5431

## 2022-05-10 HISTORY — PX: BIOPSY: SHX5522

## 2022-05-10 LAB — GLUCOSE, CAPILLARY: Glucose-Capillary: 155 mg/dL — ABNORMAL HIGH (ref 70–99)

## 2022-05-10 SURGERY — SIGMOIDOSCOPY, FLEXIBLE
Anesthesia: Monitor Anesthesia Care

## 2022-05-10 MED ORDER — CIPROFLOXACIN IN D5W 400 MG/200ML IV SOLN
INTRAVENOUS | Status: DC | PRN
  Administered 2022-05-10: 400 mg via INTRAVENOUS

## 2022-05-10 MED ORDER — LACTATED RINGERS IV SOLN: INTRAVENOUS | Status: DC | PRN

## 2022-05-10 MED ORDER — SODIUM CHLORIDE 0.9 % IV SOLN: INTRAVENOUS | Status: DC

## 2022-05-10 MED ORDER — PROPOFOL 500 MG/50ML IV EMUL
INTRAVENOUS | Status: DC | PRN
  Administered 2022-05-10: 150 ug/kg/min via INTRAVENOUS

## 2022-05-10 MED ORDER — PHENYLEPHRINE 80 MCG/ML (10ML) SYRINGE FOR IV PUSH (FOR BLOOD PRESSURE SUPPORT)
PREFILLED_SYRINGE | INTRAVENOUS | Status: DC | PRN
  Administered 2022-05-10: 160 ug via INTRAVENOUS

## 2022-05-10 MED ORDER — ASPIRIN 81 MG PO TABS: 81.0000 mg | ORAL_TABLET | Freq: Every day | ORAL | Status: DC

## 2022-05-10 MED ORDER — LIDOCAINE HCL URETHRAL/MUCOSAL 2 % EX GEL
CUTANEOUS | Status: AC
  Filled 2022-05-10: qty 30

## 2022-05-10 MED ORDER — LIDOCAINE HCL URETHRAL/MUCOSAL 2 % EX GEL
CUTANEOUS | Status: DC | PRN
  Administered 2022-05-10: 1

## 2022-05-10 MED ORDER — CIPROFLOXACIN IN D5W 400 MG/200ML IV SOLN
INTRAVENOUS | Status: AC
  Filled 2022-05-10: qty 200

## 2022-05-10 MED ORDER — LACTATED RINGERS IV SOLN: INTRAVENOUS | Status: DC

## 2022-05-10 MED ORDER — PROPOFOL 10 MG/ML IV BOLUS
INTRAVENOUS | Status: DC | PRN
  Administered 2022-05-10: 30 mg via INTRAVENOUS
  Administered 2022-05-10: 40 mg via INTRAVENOUS

## 2022-05-10 MED ORDER — LIDOCAINE 2% (20 MG/ML) 5 ML SYRINGE
INTRAMUSCULAR | Status: DC | PRN
  Administered 2022-05-10 (×2): 40 mg via INTRAVENOUS

## 2022-05-10 NOTE — Op Note (Signed)
[facility] Patient Name: Cory Harper Procedure Date: 05/10/2022 MRN: [id number] Attending MD: Justice Britain , MD Date of Birth: [birth year] CSN: [id number] Age: 66 Admit Type: Outpatient Procedure:                Pouchoscopy Indications:              History of total proctocolectomy, Follow-up                            endoscopy after surgery, History of familial                            polyposis Providers:                Justice Britain, MD, Dulcy Fanny, Cletis Athens, Technician Referring MD:             Leighton Ruff, MD, Milus Banister, MD, Elveria Rising.                            Damita Dunnings, MD Medicines:                Monitored Anesthesia Care, Cipro 761 mg IV Complications:            No immediate complications. Estimated Blood Loss:     Estimated blood loss was minimal. Procedure:                Pre-Anesthesia Assessment:                           - Prior to the procedure, a History and Physical                            was performed, and patient medications and                            allergies were reviewed. The patient's tolerance of                            previous anesthesia was also reviewed. The risks                            and benefits of the procedure and the sedation                            options and risks were discussed with the patient.                            All questions were answered, and informed consent                            was obtained. Prior Anticoagulants: The patient has                            taken  no previous anticoagulant or antiplatelet                            agents except for aspirin. ASA Grade Assessment:                            III - A patient with severe systemic disease. After                            reviewing the risks and benefits, the patient was                            deemed in satisfactory condition to undergo the                             procedure.                           After obtaining informed consent, the endoscope was                            passed under direct vision. Throughout the                            procedure, the patient's blood pressure, pulse, and                            oxygen saturations were monitored continuously. The                            [id number] ([id number]) Olympus therapeutic endoscope                            was introduced through the anus and advanced to the                            the ileoanal pouch and into the neo-terminal ileum.                            After obtaining informed consent, the endoscope was                            passed under direct vision. Throughout the                            procedure, the patient's blood pressure, pulse, and                            oxygen saturations were monitored continuously.The                            procedure was performed without difficulty. The  patient tolerated the procedure. The quality of the                            bowel preparation was adequate. Scope In: Scope Out: Findings:      Patient is status-post total colectomy with an ileal pouch-anal       anastomosis.      Hemorrhoids were found on perianal exam.      The neo-terminal ileum appeared normal.      The ileal reservoir contained a foreign body. Removal of the staple was       accomplished with a regular forceps.      The j-pouch and ileoanal pouch contained many semi-sessile, non-bleeding       polyps (greater than 100). The polyps were 2 to 10 mm in diameter. 55       polyps were removed removed with a cold snare beginning approximately.       The majority of the remaining polyps are in the distal portion of the       exam. Resection and retrieval were complete.      There was an ileoanal cuff. There was a finding of a polypoid lesion       that extended from this particular area into the pouch itself for about        2-1/2 cm and encompassed approximately 30 of the wall. This was       concerning on NBI imaging for the possibility of underlying dysplasia.       Biopsies were taken with a cold forceps for histology purposes. Impression:               - Hemorrhoids found on perianal exam.                           - The examined portion of the neoterminal ileum was                            normal.                           - Presence of previous staple was found and this                            was removed successfully.                           - Many polyps greater than 100) in the ileoanal                            pouch, 55 of these were removed with a cold snare.                            Resected and retrieved.                           - Ileoanal cuff noted with polypoid appearing                            lesion in the region. Biopsied to rule out  dysplasia. Recommendation:           - The patient will be observed post-procedure,                            until all discharge criteria are met.                           - Discharge patient to home.                           - Resume previous diet.                           - Await pathology results.                           - Repeat post-surgical lower GI endoscopy in 2-3                            months for surveillance and further polyp resection.                           - If the ileaoanal cuff biopsies were to return as                            dysplastic, this particular area may not be able to                            be salvaged endoscopically other than to monitor it                            over time. Further evaluation with EUA may need to                            be considered if that is the case. Time will tell.                           - The findings and recommendations were discussed                            with the patient.                           - The findings and  recommendations were discussed                            with the patient's family. Procedure Code(s):        --- Professional ---                           [phone], Endoscopic evaluation of small intestinal                            pouch (eg, Kock pouch, ileal reservoir [S or J]);  with biopsy, single or multiple                           44799, Unlisted procedure, small intestine Diagnosis Code(s):        --- Professional ---                           K64.9, Unspecified hemorrhoids                           T18.3XXA, Foreign body in small intestine, initial                            encounter                           D13.39, Benign neoplasm of other parts of small                            intestine                           Z98.0, Intestinal bypass and anastomosis status                           Z90.49, Acquired absence of other specified parts                            of digestive tract                           Z09, Encounter for follow-up examination after                            completed treatment for conditions other than                            malignant neoplasm                           Z86.010, Personal history of colonic polyps CPT copyright 2019 American Medical Association. All rights reserved. The codes documented in this report are preliminary and upon coder review may  be revised to meet current compliance requirements. Justice Britain, MD 05/10/2022 12:19:10 PM Number of Addenda: 0

## 2022-05-10 NOTE — Anesthesia Preprocedure Evaluation (Addendum)
Anesthesia Evaluation  Patient identified by MRN, date of birth, ID band Patient awake    Reviewed: Allergy & Precautions, Patient's Chart, lab work & pertinent test results  Airway Mallampati: II       Dental   Pulmonary former smoker,    breath sounds clear to auscultation       Cardiovascular hypertension,  Rhythm:Regular     Neuro/Psych    GI/Hepatic Neg liver ROS, HX noted Dr. Nyoka Cowden   Endo/Other  diabetesHypothyroidism   Renal/GU Renal disease     Musculoskeletal  (+) Arthritis ,   Abdominal   Peds  Hematology   Anesthesia Other Findings   Reproductive/Obstetrics                             Anesthesia Physical Anesthesia Plan  ASA: 3  Anesthesia Plan: MAC   Post-op Pain Management:    Induction: Intravenous  PONV Risk Score and Plan: 1 and Treatment may vary due to age or medical condition and Ondansetron  Airway Management Planned: Nasal Cannula and Simple Face Mask  Additional Equipment:   Intra-op Plan:   Post-operative Plan:   Informed Consent: I have reviewed the patients History and Physical, chart, labs and discussed the procedure including the risks, benefits and alternatives for the proposed anesthesia with the patient or authorized representative who has indicated his/her understanding and acceptance.     Dental advisory given  Plan Discussed with: CRNA and Anesthesiologist  Anesthesia Plan Comments:         Anesthesia Quick Evaluation

## 2022-05-10 NOTE — Anesthesia Procedure Notes (Signed)
Date/Time: 05/10/2022 11:02 AM  Performed by: Cynda Familia, CRNAPre-anesthesia Checklist: Patient identified, Emergency Drugs available, Suction available, Patient being monitored and Timeout performed Oxygen Delivery Method: Simple face mask Placement Confirmation: positive ETCO2 and breath sounds checked- equal and bilateral Dental Injury: Teeth and Oropharynx as per pre-operative assessment

## 2022-05-10 NOTE — H&P (Signed)
GASTROENTEROLOGY PROCEDURE H&P NOTE   Primary Care Physician: Tonia Ghent, MD  HPI: Cory Harper is a 66 y.o. male who presents for flexible sigmoidoscopy/pouchoscopy.  Patient of Dr. 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  Past Medical History:  Diagnosis Date   Anemia    Arthritis    Cancer ([place]) 2013   stg II colon cancer s/p resection   Colonic mass    Diabetes mellitus    FAP (familial adenomatous polyposis)    Heart murmur    Hyperlipidemia    Hypertension    not on blood pressure meds since 11/13    Hypothyroidism    Nasal congestion    Past Surgical History:  Procedure Laterality Date   BIOPSY  01/15/2018   Procedure: BIOPSY;  Surgeon: Leighton Ruff, MD;  Location: [facility];  Service: Endoscopy;;   BIOPSY  07/10/2019   Procedure: BIOPSY;  Surgeon: Leighton Ruff, MD;  Location: [facility];  Service: General;;   BIOPSY  01/27/2021   Procedure: BIOPSY;  Surgeon: Leighton Ruff, MD;  Location: [facility];  Service: Endoscopy;;   BOWEL RESECTION  05/20/2012   COLON RESECTION  05/20/2012   Procedure: COLON RESECTION LAPAROSCOPIC;  Surgeon: Adin Hector, MD;  Location: [facility];  Service: General;  Laterality: N/A;  Laparoscopic Proctocolectomy, Ileal Pouch Anal Anastomoisis, Loop Ileostomy   ESOPHAGOGASTRODUODENOSCOPY (EGD) WITH PROPOFOL N/A 04/23/2013   Procedure: ESOPHAGOGASTRODUODENOSCOPY (EGD) WITH PROPOFOL;  Surgeon: Milus Banister, MD;  [facility];  Service: Endoscopy;  Laterality: N/A;  side viewing scope   FLEXIBLE SIGMOIDOSCOPY  08/01/2012   Procedure: FLEXIBLE SIGMOIDOSCOPY;  Surgeon: Leighton Ruff, MD;  [facility];  Service: Endoscopy;  Laterality: N/A;  use endoscope   FLEXIBLE SIGMOIDOSCOPY N/A 11/29/2014   Procedure: FLEXIBLE SIGMOIDOSCOPY/POUCHOSCOPY;  Surgeon: Leighton Ruff, MD;  [facility];  Service: Endoscopy;  Laterality: N/A;   ILEOSTOMY  05/20/12   ILEOSTOMY CLOSURE N/A 09/18/2012   Procedure: Loop Ileostomy Takedown with EUA ;  Surgeon: Adin Hector, MD;  Location: [facility];  Service: General;  Laterality: N/A;  loop ileostomy takedown with EUA    KNEE ARTHROSCOPY  1998   Right knee   POUCHOSCOPY  11/16/2013   Procedure: POUCHOSCOPY;  Surgeon: Leighton Ruff, MD;  [facility];  Service: Endoscopy;;   POUCHOSCOPY N/A 11/16/2015   Procedure: POUCHOSCOPY;  Surgeon: Leighton Ruff, MD;  [facility];  Service: Endoscopy;  Laterality: N/A;   POUCHOSCOPY N/A 12/20/2016   Procedure: POUCHOSCOPY;  Surgeon: Leighton Ruff, MD;  [facility];  Service: Endoscopy;  Laterality: N/A;   POUCHOSCOPY N/A 01/15/2018   Procedure: POUCHOSCOPY;  Surgeon: Leighton Ruff, MD;  [facility];  Service: Endoscopy;  Laterality: N/A;   POUCHOSCOPY N/A 07/10/2019   Procedure: POUCHOSCOPY;  Surgeon: Leighton Ruff, MD;  Location: [facility];  Service: General;  Laterality: N/A;   POUCHOSCOPY N/A 01/27/2021   Procedure: POUCHOSCOPY;  Surgeon: Leighton Ruff, MD;  [facility];  Service: Endoscopy;  Laterality: N/A;   THYROID LOBECTOMY  05/20/2012   Procedure: THYROID LOBECTOMY;  Surgeon: Adin Hector, MD;  Location: [facility];  Service: General;  Laterality: Left;  LEFT THYROID LOBECTOMY   UPPER GASTROINTESTINAL ENDOSCOPY     WISDOM TOOTH EXTRACTION     No current facility-administered medications for this encounter.  No current facility-administered medications for this encounter. Allergies  Allergen Reactions   Ibuprofen Swelling    Made his joints swell with high doses   Family History  Problem Relation Age of Onset   Uterine cancer Mother    Hypertension Mother    Stroke Mother    Colon cancer Mother    Colon polyps Mother    Cancer Mother        colon, endometrial   Diabetes Father    Obesity Father     Pneumonia Father    Cancer Father        skin - squamous    Breast cancer Sister    Diabetes Paternal Grandmother    Prostate cancer Neg Hx    Esophageal cancer Neg Hx    Social History   Socioeconomic History   Marital status: Married    Spouse name: June   Number of children: 0   Years of education: Not on file   Highest education level: Not on file  Occupational History   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[place]: 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, works in the [place] when able  Tobacco Use   Smoking status: Former    Types: Cigarettes    Quit date: 08/07/1991    Years since quitting: 30.7   Smokeless tobacco: Never   Tobacco comments:    quit in 1993  Vaping Use   Vaping Use: Never used  Substance and Sexual Activity   Alcohol use: No   Drug use: No   Sexual activity: Not on file  Other Topics Concern   Not on file  Social History Narrative   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 GEN: NAD EYE: Sclerae anicteric ENT: MMM CV: Non-tachycardic GI: Soft, NT/ND NEURO:  Alert & Oriented x 3  Lab Results: No results for input(s): "WBC", "HGB", "HCT", "PLT" in the last 72 hours. BMET No results for input(s): "NA", "K", "CL", "CO2", "GLUCOSE", "BUN", "CREATININE", "CALCIUM" in the last 72 hours. LFT No results for input(s): "PROT", "ALBUMIN", "AST", "ALT", "ALKPHOS", "BILITOT", "BILIDIR", "IBILI" in the last 72 hours. PT/INR No results for input(s): "LABPROT", "INR" in the  last 72 hours.   Impression / Plan: This is a 66 y.o.male who presents for flexible sigmoidoscopy/pouchoscopy.  Patient of Dr. 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  The risks and benefits of endoscopic evaluation/treatment were discussed with the patient and/or family; these include but are not limited to the risk of perforation, infection, bleeding, missed lesions, lack of diagnosis, severe illness requiring hospitalization, as well as anesthesia and sedation related illnesses.  The patient's history has been reviewed, patient examined, no change in status, and deemed stable for procedure.  The patient and/or family is  agreeable to proceed.    Justice Britain, MD [facility] Office # [phone]

## 2022-05-10 NOTE — Transfer of Care (Signed)
Immediate Anesthesia Transfer of Care Note  Patient: Cory Harper  Procedure(s) Performed: FLEXIBLE SIGMOIDOSCOPY POLYPECTOMY BIOPSY  Patient Location: PACU and Endoscopy Unit  Anesthesia Type:MAC  Level of Consciousness: awake, alert  and oriented  Airway & Oxygen Therapy: Patient Spontanous Breathing and Patient connected to face mask oxygen  Post-op Assessment: Report given to RN and Post -op Vital signs reviewed and stable  Post vital signs: Reviewed and stable  Last Vitals:  Vitals Value Taken Time  BP 124/64 05/10/22 1207  Temp    Pulse 82 05/10/22 1208  Resp 15 05/10/22 1208  SpO2 100 % 05/10/22 1208  Vitals shown include unvalidated device data.  Last Pain:  Vitals:   05/10/22 0949  TempSrc: Temporal  PainSc: 0-No pain         Complications: No notable events documented.

## 2022-05-10 NOTE — Discharge Instructions (Signed)
YOU HAD AN ENDOSCOPIC PROCEDURE TODAY: Refer to the procedure report and other information in the discharge instructions given to you for any specific questions about what was found during the examination. If this information does not answer your questions, please call [facility] office at [phone] to clarify.  ° °YOU SHOULD EXPECT: Some feelings of bloating in the abdomen. Passage of more gas than usual. Walking can help get rid of the air that was put into your GI tract during the procedure and reduce the bloating. If you had a lower endoscopy (such as a colonoscopy or flexible sigmoidoscopy) you may notice spotting of blood in your stool or on the toilet paper. Some abdominal soreness may be present for a day or two, also. ° °DIET: Your first meal following the procedure should be a light meal and then it is ok to progress to your normal diet. A half-sandwich or bowl of soup is an example of a good first meal. Heavy or fried foods are harder to digest and may make you feel nauseous or bloated. Drink plenty of fluids but you should avoid alcoholic beverages for 24 hours. If you had a esophageal dilation, please see attached instructions for diet.   ° °ACTIVITY: Your care partner should take you home directly after the procedure. You should plan to take it easy, moving slowly for the rest of the day. You can resume normal activity the day after the procedure however YOU SHOULD NOT DRIVE, use power tools, machinery or perform tasks that involve climbing or major physical exertion for 24 hours (because of the sedation medicines used during the test).  ° °SYMPTOMS TO REPORT IMMEDIATELY: °A gastroenterologist can be reached at any hour. Please call [phone]  for any of the following symptoms:  °Following lower endoscopy (colonoscopy, flexible sigmoidoscopy) °Excessive amounts of blood in the stool  °Significant tenderness, worsening of abdominal pains  °Swelling of the abdomen that is new, acute  °Fever of 100° or  higher  °Following upper endoscopy (EGD, EUS, ERCP, esophageal dilation) °Vomiting of blood or coffee ground material  °New, significant abdominal pain  °New, significant chest pain or pain under the shoulder blades  °Painful or persistently difficult swallowing  °New shortness of breath  °Black, tarry-looking or red, bloody stools ° °FOLLOW UP:  °If any biopsies were taken you will be contacted by phone or by letter within the next 1-3 weeks. Call [phone]  if you have not heard about the biopsies in 3 weeks.  °Please also call with any specific questions about appointments or follow up tests. ° °

## 2022-05-10 NOTE — Anesthesia Postprocedure Evaluation (Signed)
Anesthesia Post Note  Patient: Cory Harper  Procedure(s) Performed: [place] BIOPSY     Patient location during evaluation: Endoscopy Anesthesia Type: MAC Level of consciousness: awake Pain management: pain level controlled Respiratory status: spontaneous breathing Cardiovascular status: stable Postop Assessment: no apparent nausea or vomiting Anesthetic complications: no   No notable events documented.  Last Vitals:  Vitals:   05/10/22 1207 05/10/22 1210  BP: 124/64 115/64  Pulse: 87 78  Resp: 18 15  Temp: (!) 36.2 C   SpO2: 100% 100%    Last Pain:  Vitals:   05/10/22 1210  TempSrc:   PainSc: 0-No pain                 Jacori Mulrooney

## 2022-05-11 LAB — SURGICAL PATHOLOGY

## 2022-05-12 ENCOUNTER — Encounter: Payer: Self-pay | Admitting: Gastroenterology

## 2022-05-14 ENCOUNTER — Encounter (HOSPITAL_COMMUNITY): Payer: Self-pay | Admitting: Gastroenterology

## 2022-06-03 ENCOUNTER — Other Ambulatory Visit: Payer: Self-pay | Admitting: Family Medicine

## 2022-06-06 ENCOUNTER — Other Ambulatory Visit: Payer: Self-pay

## 2022-06-06 ENCOUNTER — Encounter: Payer: Self-pay | Admitting: Gastroenterology

## 2022-06-06 DIAGNOSIS — D128 Benign neoplasm of rectum: Secondary | ICD-10-CM

## 2022-06-06 DIAGNOSIS — D132 Benign neoplasm of duodenum: Secondary | ICD-10-CM

## 2022-06-06 NOTE — Progress Notes (Signed)
Appt has been scheduled for 08/02/22 at 10 am at [facility] with GM.  Dr Rush Landmark what prep would you like the pt to have for this procedure?

## 2022-06-06 NOTE — Progress Notes (Signed)
The proposed treatment discussed in conference is for discussion purpose only and is not a binding recommendation.  The patients have not been physically examined, or presented with their treatment options.  Therefore, final treatment plans cannot be decided.  

## 2022-06-06 NOTE — Progress Notes (Signed)
Case discussed at [facility] today.  Reviewed recent pouchoscopy with finding of many pouch polyps with 55 resected all returning as adenomas.  Also with a dysplastic lesion that went from the pouch to the anal cuff.  Recommendation was for consideration of end ileostomy but if the patient was not interested in that at this time then close monitoring and surveillance could be pursued.  I called and spoke with the patient this morning and discussed our conversation.  He is willing to hear more about an end ileostomy, though he remembers his previous issues with his ostomy before his pouch creation and is not sure if he would want to have that done.  He does understand that an ileostomy would be a means by which this particular area could be removed completely and have less the risk of cancer formation but he wants to wait and think on that and have further discussions.  In the interim he agrees to continuing high risk surveillance and continued polyp resection.  I am willing to move forward with this high risk surveillance and further polyp resection at this time.  I will plan to see him the last week of December for his next polypectomy session.  I will forward this information to Dr. Marcello Moores and Dr. Johney Maine so that they have an opportunity to see the patient in clinic (patient happy to see them and talk further about end ileostomy though patient likely will not agree to that currently).  I will forward this to his oncologist and primary care provide as well.   Justice Britain, MD [facility] Office # [phone]

## 2022-06-07 NOTE — Progress Notes (Signed)
Same prep as we did last time will be fine.

## 2022-06-08 NOTE — Progress Notes (Signed)
Flex scheduled, pt instructed and medications reviewed.  Patient instructions mailed to home.  Patient to call with any questions or concerns.

## 2022-06-08 NOTE — Progress Notes (Signed)
Instructions have been entered and sent to the pt   Left message on machine to call back

## 2022-06-10 ENCOUNTER — Ambulatory Visit
Admission: EM | Admit: 2022-06-10 | Discharge: 2022-06-10 | Disposition: A | Discharge status: Home / Self Care | Payer: BC Managed Care – PPO | Attending: Emergency Medicine | Admitting: Emergency Medicine

## 2022-06-10 DIAGNOSIS — R051 Acute cough: Secondary | ICD-10-CM | POA: Insufficient documentation

## 2022-06-10 DIAGNOSIS — J0100 Acute maxillary sinusitis, unspecified: Secondary | ICD-10-CM | POA: Insufficient documentation

## 2022-06-10 MED ORDER — AMOXICILLIN 875 MG PO TABS: 875.0000 mg | ORAL_TABLET | Freq: Two times a day (BID) | ORAL | 0 refills | Status: AC

## 2022-06-10 NOTE — ED Triage Notes (Signed)
Patient to Urgent Care with complaints of productive cough x1 week, reports that it has worsened over the last 3 days. Describes cough as productive with green and yellow sputum production.   Also reports sinus pressure and pain.   Wife reports low grade fever last night (100.5).

## 2022-06-10 NOTE — Discharge Instructions (Addendum)
Take the amoxicillin as directed.  Follow up with your primary care provider.

## 2022-06-10 NOTE — ED Provider Notes (Signed)
Cory Harper    CSN: [id number] Arrival date & time: 06/10/22  0818      History   Chief Complaint Chief Complaint  Patient presents with   Cough    HPI Cory Harper is a 66 y.o. male.  Patient presents with congestion, sinus pressure, and cough x 1 week, worse in the past 3 days.  Low grade fever of 100.5 last evening.  Treatment at home with Mucinex and Tylenol.  No chest pain, shortness of breath, vomiting, diarrhea, or other symptoms.  His medical history includes hypertension, diabetes, colon cancer.   The history is provided by the patient, medical records and the spouse.    Past Medical History:  Diagnosis Date   Anemia    Arthritis    Cancer ([place]) 2013   stg II colon cancer s/p resection   Colonic mass    Diabetes mellitus    FAP (familial adenomatous polyposis)    Heart murmur    Hyperlipidemia    Hypertension    not on blood pressure meds since 11/13    Hypothyroidism    Nasal congestion     Patient Active Problem List   Diagnosis Date Noted   History of anemia 12/28/2020   Type 2 diabetes mellitus with stage 2 chronic kidney disease, without long-term current use of insulin ([place]) 07/12/2017   Routine general medical examination at a health care facility 06/22/2015   Advance care planning 06/22/2015   Hypothyroidism 06/25/2012   History of colon cancer 05/20/2012   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   Pure hypercholesterolemia 01/30/2012   B12 deficiency 09/27/2010   UNSPECIFIED VITAMIN D DEFICIENCY 09/27/2010   HYPERTENSION, BENIGN ESSENTIAL 08/04/2007   Obesity (BMI 30-39.9) 02/27/2007   ECZEMA 02/27/2007   Familial adenomatous polyposis coli, s/p proctocolectomy/J pouch 05/20/2012 02/27/2007    Past Surgical History:  Procedure Laterality Date   BIOPSY  01/15/2018   Procedure: BIOPSY;  Surgeon: Leighton Ruff, MD;  Location: [facility];  Service: Endoscopy;;   BIOPSY  07/10/2019    Procedure: BIOPSY;  Surgeon: Leighton Ruff, MD;  Location: [facility];  Service: General;;   BIOPSY  01/27/2021   Procedure: BIOPSY;  Surgeon: Leighton Ruff, MD;  Location: [facility];  Service: Endoscopy;;   BIOPSY  05/10/2022   Procedure: BIOPSY;  Surgeon: Irving Copas., MD;  Location: [facility];  Service: Gastroenterology;;   BOWEL RESECTION  05/20/2012   COLON RESECTION  05/20/2012   Procedure: COLON RESECTION LAPAROSCOPIC;  Surgeon: Adin Hector, MD;  Location: [facility];  Service: General;  Laterality: N/A;  Laparoscopic Proctocolectomy, Ileal Pouch Anal Anastomoisis, Loop Ileostomy   ESOPHAGOGASTRODUODENOSCOPY (EGD) WITH PROPOFOL N/A 04/23/2013   Procedure: ESOPHAGOGASTRODUODENOSCOPY (EGD) WITH PROPOFOL;  Surgeon: Milus Banister, MD;  [facility];  Service: Endoscopy;  Laterality: N/A;  side viewing scope   FLEXIBLE SIGMOIDOSCOPY  08/01/2012   Procedure: FLEXIBLE SIGMOIDOSCOPY;  Surgeon: Leighton Ruff, MD;  [facility];  Service: Endoscopy;  Laterality: N/A;  use endoscope   FLEXIBLE SIGMOIDOSCOPY N/A 11/29/2014   Procedure: FLEXIBLE SIGMOIDOSCOPY/POUCHOSCOPY;  Surgeon: Leighton Ruff, MD;  [facility];  Service: Endoscopy;  Laterality: N/A;   FLEXIBLE SIGMOIDOSCOPY N/A 05/10/2022   Procedure: FLEXIBLE SIGMOIDOSCOPY;  Surgeon: Rush Landmark Telford Nab., MD;  Location[facility];  Service: Gastroenterology;  Laterality: N/A;   ILEOSTOMY  05/20/12   ILEOSTOMY CLOSURE N/A 09/18/2012   Procedure: Loop Ileostomy Takedown with EUA ;  Surgeon: Remo Lipps  C. Gross, MD;  Location: [facility];  Service: General;  Laterality: N/A;  loop ileostomy takedown with EUA    KNEE ARTHROSCOPY  1998   Right knee   POLYPECTOMY  05/10/2022   Procedure: POLYPECTOMY;  Surgeon: Mansouraty, Telford Nab., MD;  Location: [facility];  Service: Gastroenterology;;   POUCHOSCOPY  11/16/2013   Procedure: POUCHOSCOPY;  Surgeon: Leighton Ruff, MD;  Location: [facility];   Service: Endoscopy;;   POUCHOSCOPY N/A 11/16/2015   Procedure: POUCHOSCOPY;  Surgeon: Leighton Ruff, MD;  [facility];  Service: Endoscopy;  Laterality: N/A;   POUCHOSCOPY N/A 12/20/2016   Procedure: POUCHOSCOPY;  Surgeon: Leighton Ruff, MD;  [facility];  Service: Endoscopy;  Laterality: N/A;   POUCHOSCOPY N/A 01/15/2018   Procedure: POUCHOSCOPY;  Surgeon: Leighton Ruff, MD;  [facility];  Service: Endoscopy;  Laterality: N/A;   POUCHOSCOPY N/A 07/10/2019   Procedure: POUCHOSCOPY;  Surgeon: Leighton Ruff, MD;  Location: [facility];  Service: General;  Laterality: N/A;   POUCHOSCOPY N/A 01/27/2021   Procedure: POUCHOSCOPY;  Surgeon: Leighton Ruff, MD;  [facility];  Service: Endoscopy;  Laterality: N/A;   THYROID LOBECTOMY  05/20/2012   Procedure: THYROID LOBECTOMY;  Surgeon: Adin Hector, MD;  Location: [facility];  Service: General;  Laterality: Left;  LEFT THYROID LOBECTOMY   UPPER GASTROINTESTINAL ENDOSCOPY     WISDOM TOOTH EXTRACTION         Home Medications    Prior to Admission medications   Medication Sig Start Date End Date Taking? Authorizing Provider  amoxicillin (AMOXIL) 875 MG tablet Take 1 tablet (875 mg total) by mouth 2 (two) times daily for 10 days. 06/10/22 06/20/22 Yes Sharion Balloon, NP  acetaminophen (TYLENOL) 500 MG tablet Take 1,000 mg by mouth every 6 (six) hours as needed for moderate pain.    [provider]  aspirin 81 MG tablet Take 1 tablet (81 mg total) by mouth daily. 05/12/22   Mansouraty, Telford Nab., MD  atorvastatin (LIPITOR) 20 MG tablet TAKE 1 TABLET(20 MG) BY MOUTH DAILY Patient taking differently: Take 20 mg by mouth at bedtime. 12/04/21   Tonia Ghent, MD  Blood Glucose Monitoring Suppl (ONE TOUCH ULTRA SYSTEM KIT) W/DEVICE KIT Dispense 1 kit with 100 lancets and strips, with 3 rf on lancets and strips.  Check sugar daily.  Dx E11.9 06/30/15   Tonia Ghent, MD  cholecalciferol (VITAMIN D)  1000 UNITS tablet Take 1,000 Units by mouth daily.     [provider]  FEROSUL 325 (65 Fe) MG tablet TAKE 1 TABLET(325 MG) BY MOUTH TWICE DAILY Patient taking differently: Take 325 mg by mouth at bedtime. 01/30/22   Tonia Ghent, MD  fluticasone (FLONASE) 50 MCG/ACT nasal spray SHAKE LIQUID AND USE 2 SPRAYS IN EACH NOSTRIL DAILY Patient taking differently: Place 1 spray into both nostrils daily as needed for allergies. 03/08/22   Tonia Ghent, MD  glimepiride (AMARYL) 2 MG tablet TAKE 2 TABLETS BY MOUTH IN THE MORNING THEN TAKE 1 TABLET BY MOUTH IN THE EVENING 09/20/21   Tonia Ghent, MD  JARDIANCE 10 MG TABS tablet TAKE 1 TABLET BY MOUTH DAILY Patient taking differently: Take 10 mg by mouth at bedtime. 10/12/21   Tonia Ghent, MD  levothyroxine (SYNTHROID) 100 MCG tablet TAKE 1 TABLET(100 MCG) BY MOUTH EVERY MORNING 10/12/21   Tonia Ghent, MD  loperamide (ANTI-DIARRHEAL) 2 MG tablet TAKE 1-2 TABLETS BY MOUTH TWICE DAILY AS NEEDED Patient taking differently:  Take 2 mg by mouth in the morning and at bedtime. 10/20/20   Tonia Ghent, MD  loratadine (CLARITIN) 10 MG tablet Take 10 mg by mouth daily.     [provider]  metFORMIN (GLUCOPHAGE) 1000 MG tablet TAKE 2 TABLETS BY MOUTH DAILY Patient taking differently: Take 1,000 mg by mouth every evening. 10/12/21   Tonia Ghent, MD  [facility] ULTRA test strip USE AS DIRECTED TO TEST BLOOD GLUCOSE DAILY 06/04/22   Tonia Ghent, MD  TRULICITY 4.5 YB/0.1BP SOPN INJECT 4.5MG ONCE A WEEK AS DIRECTED Patient taking differently: Inject 4.5 mg into the skin every Saturday. 12/21/21   Philemon Kingdom, MD  vitamin B-12 (CYANOCOBALAMIN) 500 MCG tablet Take 1 tablet (500 mcg total) by mouth 2 (two) times a week. 06/15/21   Tonia Ghent, MD    Family History Family History  Problem Relation Age of Onset   Uterine cancer Mother    Hypertension Mother    Stroke Mother    Colon cancer Mother    Colon polyps Mother     Cancer Mother        colon, endometrial   Diabetes Father    Obesity Father    Pneumonia Father    Cancer Father        skin - squamous    Breast cancer Sister    Diabetes Paternal Grandmother    Prostate cancer Neg Hx    Esophageal cancer Neg Hx     Social History Social History   Tobacco Use   Smoking status: Former    Types: Cigarettes    Quit date: 08/07/1991    Years since quitting: 30.8   Smokeless tobacco: Never   Tobacco comments:    quit in 1993  Vaping Use   Vaping Use: Never used  Substance Use Topics   Alcohol use: No   Drug use: No     Allergies   Ibuprofen   Review of Systems Review of Systems  Constitutional:  Positive for fever. Negative for chills.  HENT:  Positive for congestion, postnasal drip, rhinorrhea and sinus pressure. Negative for ear pain and sore throat.   Respiratory:  Positive for cough. Negative for shortness of breath.   Cardiovascular:  Negative for chest pain and palpitations.  Gastrointestinal:  Negative for diarrhea and vomiting.  All other systems reviewed and are negative.    Physical Exam Triage Vital Signs ED Triage Vitals  Enc Vitals Group     BP      Pulse      Resp      Temp      Temp src      SpO2      Weight      Height      Head Circumference      Peak Flow      Pain Score      Pain Loc      Pain Edu?      Excl. in [place]?    No data found.  Updated Vital Signs BP 130/75   Pulse 72   Temp 98.8 F (37.1 C)   Resp 20   Ht _0  (1.803 m)   Wt 230 lb (104.3 kg)   SpO2 97%   BMI 32.08 kg/m   Visual Acuity Right Eye Distance:   Left Eye Distance:   Bilateral Distance:    Right Eye Near:   Left Eye Near:    Bilateral Near:     Physical  Exam Vitals and nursing note reviewed.  Constitutional:      General: He is not in acute distress.    Appearance: Normal appearance. He is well-developed. He is not ill-appearing.  HENT:     Right Ear: Tympanic membrane normal.     Left Ear: Tympanic  membrane normal.     Nose: Congestion present.     Mouth/Throat:     Mouth: Mucous membranes are moist.     Pharynx: Oropharynx is clear.  Cardiovascular:     Rate and Rhythm: Normal rate and regular rhythm.     Heart sounds: Normal heart sounds.  Pulmonary:     Effort: Pulmonary effort is normal. No respiratory distress.     Breath sounds: Normal breath sounds.  Musculoskeletal:     Cervical back: Neck supple.  Skin:    General: Skin is warm and dry.  Neurological:     Mental Status: He is alert.  Psychiatric:        Mood and Affect: Mood normal.        Behavior: Behavior normal.      UC Treatments / Results  Labs (all labs ordered are listed, but only abnormal results are displayed) Labs Reviewed - No data to display  EKG   Radiology No results found.  Procedures Procedures (including critical care time)  Medications Ordered in UC Medications - No data to display  Initial Impression / Assessment and Plan / UC Course  I have reviewed the triage vital signs and the nursing notes.  Pertinent labs & imaging results that were available during my care of the patient were reviewed by me and considered in my medical decision making (see chart for details).   Acute sinusitis, cough.  Treating with amoxicillin.  Discussed symptomatic treatment including Tylenol, rest, hydration.  Instructed patient to follow up with PCP if symptoms are not improving.  He agrees to plan of care.    Final Clinical Impressions(s) / UC Diagnoses   Final diagnoses:  Acute non-recurrent maxillary sinusitis  Acute cough     Discharge Instructions      Take the amoxicillin as directed.  Follow up with your primary care provider.        ED Prescriptions     Medication Sig Dispense Auth. Provider   amoxicillin (AMOXIL) 875 MG tablet Take 1 tablet (875 mg total) by mouth 2 (two) times daily for 10 days. 20 tablet Sharion Balloon, NP      PDMP not reviewed this encounter.   Sharion Balloon, NP 06/10/22 [phone]

## 2022-06-15 ENCOUNTER — Inpatient Hospital Stay: Payer: BC Managed Care – PPO | Attending: Oncology | Admitting: Oncology

## 2022-06-15 VITALS — BP 144/69 | HR 88 | Temp 98.2°F | Resp 18 | Ht 71.0 in | Wt 218.8 lb

## 2022-06-15 DIAGNOSIS — C180 Malignant neoplasm of cecum: Secondary | ICD-10-CM | POA: Diagnosis not present

## 2022-06-15 DIAGNOSIS — Z79899 Other long term (current) drug therapy: Secondary | ICD-10-CM | POA: Diagnosis not present

## 2022-06-15 DIAGNOSIS — E119 Type 2 diabetes mellitus without complications: Secondary | ICD-10-CM | POA: Diagnosis not present

## 2022-06-15 DIAGNOSIS — Z8719 Personal history of other diseases of the digestive system: Secondary | ICD-10-CM | POA: Diagnosis not present

## 2022-06-15 NOTE — Progress Notes (Signed)
  Cory Harper OFFICE PROGRESS NOTE   Diagnosis: Colon cancer  INTERVAL HISTORY:   Cory Harper returns as scheduled.  He feels well.  Good appetite.  He has approximately 6 bowel movements per day.  He is followed by Dr. Rush Harper for pouch surveillance.  A pouchoscopy on 05/10/2022 revealed multiple polyps.  55 polyps were removed.  The pathology revealed fragments of tubular adenomas.  No high-grade dysplasia or malignancy.  He is scheduled for a repeat pouchoscopy at a 2-38-monthinterval. He developed a "sinus "infection last weekend.  He is completing a course of antibiotics.  His symptoms have partially improved.  Objective:  Vital signs in last 24 hours:  Blood pressure (!) 144/69, pulse 88, temperature 98.2 F (36.8 C), temperature source Oral, resp. rate 18, height '5\' 11"'$  (1.803 m), weight 218 lb 12.8 oz (99.2 kg), SpO2 98 %.    Lymphatics: No cervical, supraclavicular, axillary, or inguinal nodes Resp: Bilateral inspiratory/expiratory rhonchi and wheeze at the posterior chest, no respiratory distress Cardio: Regular rate and rhythm GI: No hepatosplenomegaly, no mass, nontender Vascular: Bilateral lower extremity varicosities, no edema    Lab Results:  Lab Results  Component Value Date   WBC 5.1 01/05/2022   HGB 13.2 01/05/2022   HCT 39.4 01/05/2022   MCV 90.3 01/05/2022   PLT 221.0 01/05/2022   NEUTROABS 2.8 01/05/2022    CMP  Lab Results  Component Value Date   NA 137 06/13/2021   K 4.5 06/13/2021   CL 102 06/13/2021   CO2 28 06/13/2021   GLUCOSE 133 (H) 06/13/2021   BUN 20 06/13/2021   CREATININE 1.09 06/13/2021   CALCIUM 9.5 06/13/2021   PROT 7.0 06/13/2021   ALBUMIN 3.9 06/13/2021   AST 14 06/13/2021   ALT 13 06/13/2021   ALKPHOS 75 06/13/2021   BILITOT 0.7 06/13/2021   GFRNONAA 68 (L) 09/22/2012   GFRAA 79 (L) 09/22/2012    Lab Results  Component Value Date   [name]1 1.45 06/16/2018   [name] <0.5 06/15/2016    Medications: I have  reviewed the patient's current medications.   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 Ileostomy takedown 09/18/2012.   2. Familial polyposis with multiple adenomatous polyps noted on the colectomy specimen 05/20/2012 -followed by Dr. MRush Landmarkand Dr. TMarcello Mooresfor surveillance of the J-pouch 3. 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   4. Diabetes.   5. [facility] of [facility]adenomatous polyps removed near the papilla, [facility] EGD 02/09/2019 21-2 mm mucosal nodules in the first portion of the duodenum biopsy-chronic active duodenitis, negative for dysplasia Upper endoscopy 02/05/2022-soft inflamed nodule below the GE junction removed-exam otherwise normal, biopsy negative for intestinal metaplasia or dysplasia    Disposition: [name] in clinical remission from colon cancer.  He has familial polyposis.  He will continue follow-up with GI for surveillance of the J-pouch and upper GI tract.  He would like to continue follow-up at the Cancer center.  He will return for an office visit in 1 year.  He will see Dr. DDamita Dunningsfor follow-up of the current upper respiratory infection.  GBetsy Coder MD  06/15/2022  9:42 AM

## 2022-06-21 ENCOUNTER — Encounter: Payer: Self-pay | Admitting: Family Medicine

## 2022-06-21 ENCOUNTER — Ambulatory Visit: Payer: BC Managed Care – PPO | Admitting: Family Medicine

## 2022-06-21 VITALS — BP 138/62 | HR 101 | Temp 97.9°F | Ht 71.0 in | Wt 221.0 lb

## 2022-06-21 DIAGNOSIS — Z8709 Personal history of other diseases of the respiratory system: Secondary | ICD-10-CM | POA: Diagnosis not present

## 2022-06-21 DIAGNOSIS — S80219A Abrasion, unspecified knee, initial encounter: Secondary | ICD-10-CM

## 2022-06-21 DIAGNOSIS — D1391 Familial adenomatous polyposis: Secondary | ICD-10-CM | POA: Diagnosis not present

## 2022-06-21 DIAGNOSIS — S80211A Abrasion, right knee, initial encounter: Secondary | ICD-10-CM | POA: Diagnosis not present

## 2022-06-21 MED ORDER — FERROUS SULFATE 325 (65 FE) MG PO TABS: 325.0000 mg | ORAL_TABLET | Freq: Every day | ORAL | Status: DC

## 2022-06-21 NOTE — Patient Instructions (Signed)
Your lungs sound clear.  Update me as needed.  Take care.  Glad to see you. We can do labs at your next visit.

## 2022-06-21 NOTE — Progress Notes (Signed)
He has GI f/u pending. He is trying to defer colostomy as long as possible.   He had endocrinology f/u pending.   He hasn't been able to exercise as much after falling, d/w pt.  Scraped R knee.  He was traveling to [place], had got out to get a cup of coffee and tripped over the curb.  No LOC.  Bruising resolving on L abd wall.  R knee scabbed.    Seen at [facility] re: sinus infection.  Tx'd with amoxil.  He couldn't sing at church prev but was able to sing now.  Fevers are gone now.  He doesn't feel a wheeze note.  Some sputum, improved.  No facial pain.    Meds, vitals, and allergies reviewed.   ROS: Per HPI unless specifically indicated in ROS section    GEN: nad, alert and oriented HEENT: ncat, MMM NECK: supple w/o LA CV: rrr.  PULM: ctab, no inc wob ABD: soft, +bs, nontender to palpation. SKIN: Bruising resolving on L abd wall.  R knee scabbed.

## 2022-06-24 DIAGNOSIS — Z8709 Personal history of other diseases of the respiratory system: Secondary | ICD-10-CM | POA: Insufficient documentation

## 2022-06-24 DIAGNOSIS — S80219A Abrasion, unspecified knee, initial encounter: Secondary | ICD-10-CM | POA: Insufficient documentation

## 2022-06-24 NOTE — Assessment & Plan Note (Signed)
With follow-up pending at GI clinic.

## 2022-06-24 NOTE — Assessment & Plan Note (Signed)
Should resolve.  He will update me as needed.

## 2022-06-24 NOTE — Assessment & Plan Note (Signed)
Improved in the meantime.  Lungs are clear.  He will update me as needed.

## 2022-07-10 ENCOUNTER — Encounter: Payer: Self-pay | Admitting: Family Medicine

## 2022-07-10 ENCOUNTER — Ambulatory Visit: Payer: BC Managed Care – PPO | Admitting: Family Medicine

## 2022-07-10 VITALS — BP 118/62 | HR 75 | Temp 97.6°F | Ht 71.0 in | Wt 222.0 lb

## 2022-07-10 DIAGNOSIS — Z862 Personal history of diseases of the blood and blood-forming organs and certain disorders involving the immune mechanism: Secondary | ICD-10-CM

## 2022-07-10 DIAGNOSIS — E119 Type 2 diabetes mellitus without complications: Secondary | ICD-10-CM

## 2022-07-10 DIAGNOSIS — E538 Deficiency of other specified B group vitamins: Secondary | ICD-10-CM

## 2022-07-10 DIAGNOSIS — E7800 Pure hypercholesterolemia, unspecified: Secondary | ICD-10-CM

## 2022-07-10 DIAGNOSIS — D1391 Familial adenomatous polyposis: Secondary | ICD-10-CM

## 2022-07-10 DIAGNOSIS — E890 Postprocedural hypothyroidism: Secondary | ICD-10-CM

## 2022-07-10 LAB — CBC WITH DIFFERENTIAL/PLATELET
Basophils Absolute: 0 10*3/uL (ref 0.0–0.1)
Basophils Relative: 0.8 % (ref 0.0–3.0)
Eosinophils Absolute: 0.2 10*3/uL (ref 0.0–0.7)
Eosinophils Relative: 4 % (ref 0.0–5.0)
HCT: 40.2 % (ref 39.0–52.0)
Hemoglobin: 13.4 g/dL (ref 13.0–17.0)
Lymphocytes Relative: 33.5 % (ref 12.0–46.0)
Lymphs Abs: 1.9 10*3/uL (ref 0.7–4.0)
MCHC: 33.4 g/dL (ref 30.0–36.0)
MCV: 90.3 fl (ref 78.0–100.0)
Monocytes Absolute: 0.3 10*3/uL (ref 0.1–1.0)
Monocytes Relative: 6.3 % (ref 3.0–12.0)
Neutro Abs: 3.1 10*3/uL (ref 1.4–7.7)
Neutrophils Relative %: 55.4 % (ref 43.0–77.0)
Platelets: 208 10*3/uL (ref 150.0–400.0)
RBC: 4.45 Mil/uL (ref 4.22–5.81)
RDW: 14.4 % (ref 11.5–15.5)
WBC: 5.5 10*3/uL (ref 4.0–10.5)

## 2022-07-10 LAB — COMPREHENSIVE METABOLIC PANEL
ALT: 14 U/L (ref 0–53)
AST: 13 U/L (ref 0–37)
Albumin: 3.9 g/dL (ref 3.5–5.2)
Alkaline Phosphatase: 91 U/L (ref 39–117)
BUN: 19 mg/dL (ref 6–23)
CO2: 28 mEq/L (ref 19–32)
Calcium: 9 mg/dL (ref 8.4–10.5)
Chloride: 101 mEq/L (ref 96–112)
Creatinine, Ser: 1.08 mg/dL (ref 0.40–1.50)
GFR: 72.3 mL/min (ref >60.00)
Glucose, Bld: 117 mg/dL — ABNORMAL HIGH (ref 70–99)
Potassium: 4.2 mEq/L (ref 3.5–5.1)
Sodium: 135 mEq/L (ref 135–145)
Total Bilirubin: 0.6 mg/dL (ref 0.2–1.2)
Total Protein: 6.9 g/dL (ref 6.0–8.3)

## 2022-07-10 LAB — MICROALBUMIN / CREATININE URINE RATIO
Creatinine,U: 35 mg/dL
Microalb Creat Ratio: 2 mg/g (ref 0.0–30.0)
Microalb, Ur: <0.7 mg/dL (ref 0.0–1.9)

## 2022-07-10 LAB — LIPID PANEL
Cholesterol: 132 mg/dL (ref 0–200)
HDL: 45.1 mg/dL (ref >39.00)
NonHDL: 86.52
Total CHOL/HDL Ratio: 3
Triglycerides: 236 mg/dL — ABNORMAL HIGH (ref 0.0–149.0)
VLDL: 47.2 mg/dL — ABNORMAL HIGH (ref 0.0–40.0)

## 2022-07-10 LAB — TSH: TSH: 3.7 u[IU]/mL (ref 0.35–5.50)

## 2022-07-10 LAB — LDL CHOLESTEROL, DIRECT: Direct LDL: 54 mg/dL

## 2022-07-10 LAB — IRON: Iron: 79 ug/dL (ref 42–165)

## 2022-07-10 LAB — FERRITIN: Ferritin: 55.4 ng/mL (ref 22.0–322.0)

## 2022-07-10 LAB — VITAMIN B12: Vitamin B-12: 491 pg/mL (ref 211–911)

## 2022-07-10 MED ORDER — VITAMIN B-12 1000 MCG PO TABS: 1000.0000 ug | ORAL_TABLET | ORAL | Status: DC

## 2022-07-10 NOTE — Patient Instructions (Addendum)
Go to the lab on the way out.   If you have mychart we'll likely use that to update you.    Take care.  Glad to see you. 

## 2022-07-10 NOTE — Progress Notes (Unsigned)
GI f/u pending.   Diabetes:  will see endo soon.   Anemia f/u.  Still on iron.  No blood seen in stool.  On weekly B12 replacement, 1038mg per week.  No abd pain.   Elevated Cholesterol: Using medications without problems:yes Muscle aches: no Diet compliance: d/w pt.  Discussed getting back on the bike for exercise.   Exercise: d/w pt.    Hypothyroidism.  No ADE on med.  Compliant. No neck mass.  TSh pending.   Meds, vitals, and allergies reviewed.  ROS: Per HPI unless specifically indicated in ROS section   GEN: nad, alert and oriented HEENT: ncat NECK: supple w/o LA CV: rrr. PULM: ctab, no inc wob ABD: soft, +bs EXT: no edema SKIN: well perfused.   Diabetic foot exam: Normal inspection No skin breakdown No calluses  Normal DP pulses Normal sensation to light touch and monofilament B 1st nails thickened

## 2022-07-11 NOTE — Assessment & Plan Note (Signed)
With GI follow-up pending.

## 2022-07-11 NOTE — Assessment & Plan Note (Signed)
Continue levothyroxine as is.  See notes on labs. 

## 2022-07-11 NOTE — Assessment & Plan Note (Signed)
Continue atorvastatin.  Discussed diet and exercise.

## 2022-07-11 NOTE — Assessment & Plan Note (Signed)
Recheck labs pending.  Will update endocrinology.

## 2022-07-11 NOTE — Assessment & Plan Note (Signed)
History of B12 deficiency.  Still on weekly replacement.  See notes on labs.

## 2022-07-13 LAB — FRUCTOSAMINE: Fructosamine: 448 umol/L — ABNORMAL HIGH (ref 205–285)

## 2022-07-20 ENCOUNTER — Ambulatory Visit: Payer: BC Managed Care – PPO | Admitting: Internal Medicine

## 2022-07-20 ENCOUNTER — Encounter: Payer: Self-pay | Admitting: Internal Medicine

## 2022-07-20 VITALS — BP 132/78 | HR 72 | Ht 71.0 in | Wt 220.2 lb

## 2022-07-20 DIAGNOSIS — E1122 Type 2 diabetes mellitus with diabetic chronic kidney disease: Secondary | ICD-10-CM | POA: Diagnosis not present

## 2022-07-20 DIAGNOSIS — E890 Postprocedural hypothyroidism: Secondary | ICD-10-CM | POA: Diagnosis not present

## 2022-07-20 DIAGNOSIS — E669 Obesity, unspecified: Secondary | ICD-10-CM

## 2022-07-20 DIAGNOSIS — N182 Chronic kidney disease, stage 2 (mild): Secondary | ICD-10-CM | POA: Diagnosis not present

## 2022-07-20 DIAGNOSIS — D34 Benign neoplasm of thyroid gland: Secondary | ICD-10-CM | POA: Diagnosis not present

## 2022-07-20 LAB — POCT GLUCOSE (DEVICE FOR HOME USE): Glucose Fasting, POC: 131 mg/dL — AB (ref 70–99)

## 2022-07-20 MED ORDER — TIRZEPATIDE 5 MG/0.5ML ~~LOC~~ SOAJ: 5.0000 mg | SUBCUTANEOUS | 3 refills | Status: DC

## 2022-07-20 NOTE — Patient Instructions (Addendum)
Please continue: - Metformin 2000 mg with dinner - Glimepiride 4 mg in am and 2 mg in pm - Jardiance 10 mg daily at night  Try to change from: - Trulicity 4.5 mg weekly to Mounjaro 5 mg weekly  Please continue levothyroxine 100 mcg daily.  Take the thyroid hormone every day, with water, at least 30 minutes before breakfast, separated by at least 4 hours from: - acid reflux medications - calcium - iron - multivitamins  Please return in 4 months with your sugar log.

## 2022-07-20 NOTE — Progress Notes (Signed)
Patient ID: Cory Harper, male   DOB: 1956-06-16, 66 y.o.   MRN: [id number]   HPI: Cory Harper is a 66 y.o.-year-old male, returning for follow-up for DM2, dx in 1998, non-insulin-dependent, uncontrolled, with long term complications (+ mild CKD). Last visit 11 months ago.  Interim history: No increased urination, blurry vision, nausea, chest pain. 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  He is still very busy and still not exercising.  Also, he continues to work long hours >> dinner is late. He did not obtain a CGM since last visit. He tells me he does not want a CGM.  Reviewed his HbA1c levels: 07/10/2022: HbA1c calculated from fructosamine was 9.2%, higher Lab Results  Component Value Date   HGBA1C 11.1 (H) 01/05/2022   HGBA1C 9.9 (A) 04/11/2021   HGBA1C 10.5 (A) 12/06/2020  09/01/2021: HbA1c calculated from fructosamine is 8.36%, slightly higher than before.   06/13/2021: HbA1c calculated from fructosamine is 7.9% 12/06/2020: HbA1c calculated from fructosamine is 7.8%, higher than before, but lower than the directly measured HbA1c. 04/01/2020: HbA1c calculated from fructosamine is 7.4%, correlating better with your sugars at home. 11/18/2019: HbA1c calculated from fructosamine is 7.5%. 06/17/2019: HbA1c calculated from fructosamine is 7.0%! 05/22/2018: HbA1c calculated from the fructosamine is much better than the measured one, at 7.6%.  He is on: - Metformin 1000 mg 2x a day with meals >> 2000 mg with dinner - Amaryl 4 mg in am and 2 mg in pm - Jardiance 10 mg daily at night to avoid daytime urinary frequency-started 10/8880 - Trulicity 4.5 mg weekly Tried Actos >> signif. Weight gain On Cinnamon. He was on Januvia, Ozempic.  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  No plans for retirement yet.Marland Kitchen  Pt checks his sugars 1-2 times a day: - am:  80, 93-137, 147 >> 102-155, 160 (at today's visit: 144) >> 135, 160-166 - 2h after b'fast:  108, 143, 180 >> 158 >> 122-155 >> n/c - before lunch:  140-160 >> 126-150, 155>> 130-155, 160 >> 161-165 - 2h after lunch: 140, 155 >> n/c >> 151-160 >> 152-157 >> 155 >> 161 - before dinner:  123 >> n/c >> 151, 160-164 >> n/c  - 2h after dinner: 140, 160 >> 155-160 >> 138-160 >> 150-161 >> 160, 161 - bedtime: 1 140-155 >> 147-160 >> 150-156 >> n/c>> 158, 166 - nighttime: n/c >> 180 >> n/c Lowest sugar was  80 >> 102 ; it is unclear at which CBG level he has hypoglycemia awareness. Highest sugar was 160 >> 161.  Glucometer: One Touch Ultra mini>> One Touch Ultra  Pt's meals are: - Breakfast: bowl of grits, [place] bacon before starts work, - Lunch: [place] or ham+cheese wrap or tuna salad - Dinner: baked chicken or pork chop, green beans - Snacks: crackers, desserts  Previously using a stationary bike in the evening -not recently.  -+ Mild CKD, last BUN/creatinine:  Lab Results  Component Value Date   BUN 19 07/10/2022   BUN 20 06/13/2021   CREATININE 1.08 07/10/2022   CREATININE 1.09 06/13/2021   Lab Results  Component Value Date   GFRNONAA 68 (L) 09/22/2012   + HL; last set of lipids: Lab Results  Component Value Date   CHOL 132 07/10/2022   HDL 45.10 07/10/2022   LDLCALC 34 06/27/2020   LDLDIRECT 54.0 07/10/2022   TRIG 236.0 (H) 07/10/2022   CHOLHDL 3 07/10/2022  On  Lipitor 20. On ASA 81.  - last eye exam was 03/2021: No DR reportedly.  -No numbness and tingling in his feet.  Last foot exam 07/10/2022.  Pt has no FH of DM.  He has familial polyposis and has a history of colon cancer >> has a J-pouch - he had a pouchoscopy in 07/2019.  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  Previous thyroid ultrasound report from 02/2018: 1. Post left thyroid lobectomy without evidence of locally residual or locally recurrent disease. 2. Normal appearance of the remaining  thyroid parenchyma.  Pt denies: - feeling nodules in neck - hoarseness - dysphagia - choking  Postsurgical hypothyroidism:  Pt is on levothyroxine 100 mcg daily, taken: - in am - fasting - at least 30 min from b'fast - no Ca, MVI, PPIs - on Fe 2x a day - am (30 min after LT4) and evening >> moved later in the day - not on Biotin  Reviewed his TFTs: Lab Results  Component Value Date   TSH 3.70 07/10/2022   TSH 3.59 12/27/2020   TSH 4.12 01/26/2020   TSH 3.33 06/17/2019   TSH 3.01 05/22/2018   TSH 3.46 07/25/2017   TSH 2.29 06/27/2016   TSH 1.65 06/14/2015   TSH 1.63 03/11/2014   TSH 2.22 01/19/2013   ROS: + see HPI + leg swelling  I reviewed pt's medications, allergies, PMH, social hx, family hx, and changes were documented in the history of present illness. Otherwise, unchanged from my initial visit note.  Past Medical History:  Diagnosis Date   Anemia    Arthritis    Cancer ([place]) 2013   stg II colon cancer s/p resection   Colonic mass    Diabetes mellitus    FAP (familial adenomatous polyposis)    Heart murmur    Hyperlipidemia    Hypertension    not on blood pressure meds since 11/13    Hypothyroidism    Nasal congestion    Past Surgical History:  Procedure Laterality Date   BIOPSY  01/15/2018   Procedure: BIOPSY;  Surgeon: Leighton Ruff, MD;  Location: [facility];  Service: Endoscopy;;   BIOPSY  07/10/2019   Procedure: BIOPSY;  Surgeon: Leighton Ruff, MD;  Location: [facility];  Service: General;;   BIOPSY  01/27/2021   Procedure: BIOPSY;  Surgeon: Leighton Ruff, MD;  Location: [facility];  Service: Endoscopy;;   BIOPSY  05/10/2022   Procedure: BIOPSY;  Surgeon: Irving Copas., MD;  Location: [facility];  Service: Gastroenterology;;   BOWEL RESECTION  05/20/2012   COLON RESECTION  05/20/2012   Procedure: COLON RESECTION LAPAROSCOPIC;  Surgeon: Adin Hector, MD;  Location: [facility];  Service: General;  Laterality: N/A;  Laparoscopic  Proctocolectomy, Ileal Pouch Anal Anastomoisis, Loop Ileostomy   ESOPHAGOGASTRODUODENOSCOPY (EGD) WITH PROPOFOL N/A 04/23/2013   Procedure: ESOPHAGOGASTRODUODENOSCOPY (EGD) WITH PROPOFOL;  Surgeon: Milus Banister, MD;  [facility]: [facility];  Service: Endoscopy;  Laterality: N/A;  side viewing scope   FLEXIBLE SIGMOIDOSCOPY  08/01/2012   Procedure: FLEXIBLE SIGMOIDOSCOPY;  Surgeon: Leighton Ruff, MD;  Location: [facility];  Service: Endoscopy;  Laterality: N/A;  use endoscope   FLEXIBLE SIGMOIDOSCOPY N/A 11/29/2014   Procedure: FLEXIBLE SIGMOIDOSCOPY/POUCHOSCOPY;  Surgeon: Leighton Ruff, MD;  [facility]: [facility];  Service: Endoscopy;  Laterality: N/A;   FLEXIBLE SIGMOIDOSCOPY N/A 05/10/2022   Procedure: FLEXIBLE SIGMOIDOSCOPY;  Surgeon: Irving Copas., MD;  [facility];  Service: Gastroenterology;  Laterality: N/A;   ILEOSTOMY  05/20/12   ILEOSTOMY CLOSURE N/A 09/18/2012   Procedure: Loop Ileostomy Takedown with EUA ;  Surgeon: Adin Hector, MD;  Location: [facility];  Service: General;  Laterality: N/A;  loop ileostomy takedown with EUA    KNEE ARTHROSCOPY  1998   Right knee   POLYPECTOMY  05/10/2022   Procedure: POLYPECTOMY;  Surgeon: Rush Landmark Telford Nab., MD;  [facility];  Service: Gastroenterology;;   POUCHOSCOPY  11/16/2013   Procedure: POUCHOSCOPY;  Surgeon: Leighton Ruff, MD;  [facility];  Service: Endoscopy;;   POUCHOSCOPY N/A 11/16/2015   Procedure: POUCHOSCOPY;  Surgeon: Leighton Ruff, MD;  [facility];  Service: Endoscopy;  Laterality: N/A;   POUCHOSCOPY N/A 12/20/2016   Procedure: POUCHOSCOPY;  Surgeon: Leighton Ruff, MD;  [facility];  Service: Endoscopy;  Laterality: N/A;   POUCHOSCOPY N/A 01/15/2018   Procedure: POUCHOSCOPY;  Surgeon: Leighton Ruff, MD;  [facility];  Service: Endoscopy;  Laterality: N/A;   POUCHOSCOPY N/A 07/10/2019   Procedure: POUCHOSCOPY;  Surgeon: Leighton Ruff, MD;  Location:  [facility];  Service: General;  Laterality: N/A;   POUCHOSCOPY N/A 01/27/2021   Procedure: POUCHOSCOPY;  Surgeon: Leighton Ruff, MD;  [facility];  Service: Endoscopy;  Laterality: N/A;   THYROID LOBECTOMY  05/20/2012   Procedure: THYROID LOBECTOMY;  Surgeon: Adin Hector, MD;  Location: [facility];  Service: General;  Laterality: Left;  LEFT THYROID LOBECTOMY   UPPER GASTROINTESTINAL ENDOSCOPY     WISDOM TOOTH EXTRACTION     Social History   Socioeconomic History   Marital status: Married    Spouse name: June   Number of children: 0  Social Needs  Occupational History   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[employer]: 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, works in the [employer] when able  Tobacco Use   Smoking status: Former Smoker    Last attempt to quit: 08/07/1991    Years since quitting: 25.9   Smokeless tobacco: Never Used   Tobacco comment: quit in 1993  Substance and Sexual Activity   Alcohol use: No   Drug use: No   Sexual activity: Not on file  Other Topics Concern   Not on file  Social History Narrative   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     aspirin 81 MG tablet Take 1 tablet (81 mg total) by mouth daily. 30 tablet    atorvastatin (LIPITOR) 20 MG tablet TAKE 1 TABLET(20 MG) BY MOUTH DAILY 90 tablet 3   Blood Glucose Monitoring Suppl (ONE TOUCH ULTRA SYSTEM KIT) W/DEVICE KIT Dispense 1 kit with 100 lancets and strips, with 3 rf on lancets and strips.  Check sugar daily.  Dx E11.9 1 each 0   cholecalciferol (VITAMIN D) 1000 UNITS tablet Take 1,000 Units by mouth daily.      cyanocobalamin (VITAMIN B12) 1000 MCG tablet Take 1 tablet (1,000 mcg total) by mouth once a week.      ferrous sulfate (FEROSUL) 325 (65 FE) MG tablet Take 1 tablet (325 mg total) by mouth daily.  fluticasone (FLONASE) 50 MCG/ACT nasal spray SHAKE LIQUID AND USE 2 SPRAYS IN EACH NOSTRIL DAILY 16 g 5   glimepiride (AMARYL) 2 MG tablet TAKE 2 TABLETS BY MOUTH IN THE MORNING THEN TAKE 1 TABLET BY MOUTH IN THE EVENING 270 tablet 3   JARDIANCE 10 MG TABS tablet TAKE 1 TABLET BY MOUTH DAILY 90 tablet 3   levothyroxine (SYNTHROID) 100 MCG tablet TAKE 1 TABLET(100 MCG) BY MOUTH EVERY MORNING 90 tablet 3   loperamide (ANTI-DIARRHEAL) 2 MG tablet TAKE 1-2 TABLETS BY MOUTH TWICE DAILY AS NEEDED 360 tablet 3   loratadine (CLARITIN) 10 MG tablet Take 10 mg by mouth daily.      metFORMIN (GLUCOPHAGE) 1000 MG tablet TAKE 2 TABLETS BY MOUTH DAILY 180 tablet 3   ONETOUCH ULTRA test strip USE AS DIRECTED TO TEST BLOOD GLUCOSE DAILY 176 strip 2   TRULICITY 4.5 HY/0.7PX SOPN INJECT 4.5MG ONCE A WEEK AS DIRECTED 6 mL 0   No current facility-administered medications on file prior to visit.   Allergies  Allergen Reactions   Ibuprofen Swelling    Made his joints swell with high doses   Family History  Problem Relation Age of Onset   Uterine cancer Mother    Hypertension Mother    Stroke Mother    Colon cancer Mother    Colon polyps Mother    Cancer Mother        colon, endometrial   Diabetes Father    Obesity Father    Pneumonia Father    Cancer Father        skin - squamous    Breast cancer Sister    Diabetes Paternal Grandmother    Prostate cancer Neg Hx    Esophageal cancer Neg Hx     PE: BP 132/78 (BP Location: Left Arm, Patient Position: Sitting, Cuff Size: Normal)   Pulse 72   Ht _0  (1.803 m)   Wt 220 lb 3.2 oz (99.9 kg)   SpO2 99%   BMI 30.71 kg/m  Wt Readings from Last 3 Encounters:  07/20/22 220 lb 3.2 oz (99.9 kg)  07/10/22 222 lb (100.7 kg)  06/21/22 221 lb (100.2 kg)   Constitutional: overweight, in NAD Eyes: EOMI, no exophthalmos ENT: no thyromegaly, no cervical  lymphadenopathy Cardiovascular: RRR, No MRG, + R>L LE swelling (pitting) - chronic Respiratory: CTA B Musculoskeletal: no deformities Skin: no rashes Neurological: no tremor with outstretched hands  ASSESSMENT: 1. DM2, non-insulin-dependent, uncontrolled, with complications - CKD stage 2  2. H/o Hurthle cell neoplasm  3.  Postsurgical hypothyroidism  4.  Obesity class I  PLAN:  1. Patient with longstanding, uncontrolled, type 2 diabetes, on oral antidiabetic regimen with metformin, glimepiride, Jardiance and also weekly GLP-1 receptor agonist.  His HbA1c levels are usually discrepant with blood sugars at home, which aligns better with his fructosamine levels.  Latest fructosamine level was elevated, corresponding to an HbA1c of 9.2%, higher than before. -At today's visit, he tells me that his sugars have been worse as he is now not exercising (however, he was not exercising at last visit, either) and eating dinner as late, after he comes home from work.  Also, he tells me he would not want a CGM, which was suggested at last visit.  He cannot explain why he does not want this.  Also, we are not able to use insulin for him.  This is a difficult situation... I did suggest to switch from Trulicity to [facility], which I hope is  covered for him.  Otherwise, we will continue the same regimen for now.  He tells me that he is planning to restart exercise and improve his diet in the new year. - I suggested to:  Patient Instructions  Please continue: - Metformin 2000 mg with dinner - Glimepiride 4 mg in am and 2 mg in pm - Jardiance 10 mg daily at night  Try to change from: - Trulicity 4.5 mg weekly to Mounjaro 5 mg weekly  Please continue levothyroxine 100 mcg daily.  Take the thyroid hormone every day, with water, at least 30 minutes before breakfast, separated by at least 4 hours from: - acid reflux medications - calcium - iron - multivitamins  Please return in 4 months with your sugar  log.  - advised to check sugars at different times of the day - 1x a day, rotating check times - advised for yearly eye exams >> he is UTD - return to clinic in 4 months  2. H/o Hurthle cell neoplasm -Latest ultrasound report showed no suspicious masses in the lower neck.  The final left thyroid pathology was benign. -On the ultrasound from 2017, he had an enlarged lymph node, however, this was not obvious on the latest ultrasound from 2019 -No problems with swallowing, no neck pressure, choking, shortness of breath with lying down -No further imaging needed unless he develops neck compression symptoms  3.  Postsurgical hypothyroidism - latest thyroid labs reviewed with pt. >> normal: Lab Results  Component Value Date   TSH 3.70 07/10/2022  - he continues on LT4 100 mcg daily - pt feels good on this dose. - we discussed about taking the thyroid hormone every day, with water, >30 minutes before breakfast, separated by >4 hours from acid reflux medications, calcium, iron, multivitamins. Pt. is taking it correctly.  4.  Obesity class I -continue SGLT 2 inhibitor and GLP-1 receptor agonist which should also help with weight loss -Before last visit, he lost 4 pounds -He lost 8 pounds since then  Philemon Kingdom, MD PhD [facility] Endocrinology

## 2022-07-24 ENCOUNTER — Encounter (HOSPITAL_COMMUNITY): Payer: Self-pay | Admitting: Gastroenterology

## 2022-07-26 ENCOUNTER — Telehealth: Payer: Self-pay

## 2022-07-26 ENCOUNTER — Other Ambulatory Visit (HOSPITAL_COMMUNITY): Payer: Self-pay

## 2022-07-26 NOTE — Telephone Encounter (Signed)
Pharmacy Patient Advocate Encounter   Received notification from [facility] that prior authorization for [facility] '5MG'$ /.5ML is needed.    PA submitted on 07/26/22 Key [id number] Status is pending  Karie Soda, [place] Patient Advocate Specialist Direct Number: [phone] Fax: [phone]

## 2022-07-27 ENCOUNTER — Other Ambulatory Visit (HOSPITAL_COMMUNITY): Payer: Self-pay

## 2022-07-27 NOTE — Telephone Encounter (Signed)
Patient Advocate Encounter  Prior Authorization for Mounjaro '5mg'$ /0.84m has been approved.    PA# PA Case ID #: [id number] dates: 07/26/22 through 07/27/23  Placed a call to [facility] notify of the approval.

## 2022-08-01 NOTE — Anesthesia Preprocedure Evaluation (Signed)
Anesthesia Evaluation  Patient identified by MRN, date of birth, ID band Patient awake    Reviewed: Allergy & Precautions, NPO status , Patient's Chart, lab work & pertinent test results  Airway Mallampati: II  TM Distance: >3 FB Neck ROM: Full    Dental no notable dental hx.    Pulmonary former smoker   Pulmonary exam normal        Cardiovascular hypertension,  Rhythm:Regular Rate:Normal     Neuro/Psych negative neurological ROS  negative psych ROS   GI/Hepatic negative GI ROS, Neg liver ROS,,,  Endo/Other  diabetes, Type 2, Oral Hypoglycemic AgentsHypothyroidism    Renal/GU negative Renal ROS  negative genitourinary   Musculoskeletal  (+) Arthritis , Osteoarthritis,    Abdominal Normal abdominal exam  (+)   Peds  Hematology  (+) Blood dyscrasia, anemia   Anesthesia Other Findings   Reproductive/Obstetrics                              Anesthesia Physical Anesthesia Plan  ASA: 3  Anesthesia Plan: MAC   Post-op Pain Management:    Induction: Intravenous  PONV Risk Score and Plan: 1 and Propofol infusion and Treatment may vary due to age or medical condition  Airway Management Planned: Simple Face Mask, Natural Airway and Nasal Cannula  Additional Equipment: None  Intra-op Plan:   Post-operative Plan:   Informed Consent: I have reviewed the patients History and Physical, chart, labs and discussed the procedure including the risks, benefits and alternatives for the proposed anesthesia with the patient or authorized representative who has indicated his/her understanding and acceptance.     Dental advisory given  Plan Discussed with: CRNA  Anesthesia Plan Comments:         Anesthesia Quick Evaluation

## 2022-08-02 ENCOUNTER — Ambulatory Visit (HOSPITAL_COMMUNITY): Payer: BC Managed Care – PPO | Admitting: Anesthesiology

## 2022-08-02 ENCOUNTER — Encounter (HOSPITAL_COMMUNITY): Payer: Self-pay | Admitting: Gastroenterology

## 2022-08-02 ENCOUNTER — Other Ambulatory Visit: Payer: Self-pay

## 2022-08-02 ENCOUNTER — Ambulatory Visit (HOSPITAL_COMMUNITY)
Admission: RE | Admit: 2022-08-02 | Discharge: 2022-08-02 | Disposition: A | Discharge status: Home / Self Care | Payer: BC Managed Care – PPO | Attending: Gastroenterology | Admitting: Gastroenterology

## 2022-08-02 ENCOUNTER — Encounter (HOSPITAL_COMMUNITY)
Admission: RE | Disposition: A | Discharge status: Home / Self Care | Payer: Self-pay | Source: Home / Self Care | Attending: Gastroenterology

## 2022-08-02 DIAGNOSIS — Z87891 Personal history of nicotine dependence: Secondary | ICD-10-CM | POA: Diagnosis not present

## 2022-08-02 DIAGNOSIS — Z86010 Personal history of colonic polyps: Secondary | ICD-10-CM | POA: Insufficient documentation

## 2022-08-02 DIAGNOSIS — E039 Hypothyroidism, unspecified: Secondary | ICD-10-CM | POA: Insufficient documentation

## 2022-08-02 DIAGNOSIS — D1339 Benign neoplasm of other parts of small intestine: Secondary | ICD-10-CM | POA: Diagnosis not present

## 2022-08-02 DIAGNOSIS — E119 Type 2 diabetes mellitus without complications: Secondary | ICD-10-CM | POA: Insufficient documentation

## 2022-08-02 DIAGNOSIS — M1990 Unspecified osteoarthritis, unspecified site: Secondary | ICD-10-CM | POA: Insufficient documentation

## 2022-08-02 DIAGNOSIS — R21 Rash and other nonspecific skin eruption: Secondary | ICD-10-CM | POA: Diagnosis not present

## 2022-08-02 DIAGNOSIS — Z83719 Family history of colon polyps, unspecified: Secondary | ICD-10-CM | POA: Insufficient documentation

## 2022-08-02 DIAGNOSIS — Z980 Intestinal bypass and anastomosis status: Secondary | ICD-10-CM | POA: Diagnosis not present

## 2022-08-02 DIAGNOSIS — K624 Stenosis of anus and rectum: Secondary | ICD-10-CM | POA: Diagnosis not present

## 2022-08-02 DIAGNOSIS — D128 Benign neoplasm of rectum: Secondary | ICD-10-CM

## 2022-08-02 DIAGNOSIS — Z800 Family history of malignant neoplasm of digestive organs: Secondary | ICD-10-CM | POA: Diagnosis not present

## 2022-08-02 DIAGNOSIS — D132 Benign neoplasm of duodenum: Secondary | ICD-10-CM

## 2022-08-02 DIAGNOSIS — I10 Essential (primary) hypertension: Secondary | ICD-10-CM | POA: Insufficient documentation

## 2022-08-02 DIAGNOSIS — Z7984 Long term (current) use of oral hypoglycemic drugs: Secondary | ICD-10-CM | POA: Diagnosis not present

## 2022-08-02 DIAGNOSIS — Z85038 Personal history of other malignant neoplasm of large intestine: Secondary | ICD-10-CM | POA: Insufficient documentation

## 2022-08-02 DIAGNOSIS — K6389 Other specified diseases of intestine: Secondary | ICD-10-CM | POA: Diagnosis not present

## 2022-08-02 DIAGNOSIS — Z1509 Genetic susceptibility to other malignant neoplasm: Secondary | ICD-10-CM | POA: Diagnosis not present

## 2022-08-02 DIAGNOSIS — Z09 Encounter for follow-up examination after completed treatment for conditions other than malignant neoplasm: Secondary | ICD-10-CM | POA: Insufficient documentation

## 2022-08-02 DIAGNOSIS — E785 Hyperlipidemia, unspecified: Secondary | ICD-10-CM | POA: Diagnosis not present

## 2022-08-02 HISTORY — PX: FOREIGN BODY REMOVAL: SHX962

## 2022-08-02 HISTORY — PX: POLYPECTOMY: SHX5525

## 2022-08-02 HISTORY — PX: ENDOSCOPIC MUCOSAL RESECTION: SHX6839

## 2022-08-02 HISTORY — PX: POUCHOSCOPY: SHX6321

## 2022-08-02 HISTORY — PX: BIOPSY: SHX5522

## 2022-08-02 LAB — GLUCOSE, CAPILLARY: Glucose-Capillary: 195 mg/dL — ABNORMAL HIGH (ref 70–99)

## 2022-08-02 SURGERY — ENDOSCOPY, POUCH, SMALL INTESTINE, DIAGNOSTIC
Anesthesia: Monitor Anesthesia Care

## 2022-08-02 MED ORDER — PROPOFOL 10 MG/ML IV BOLUS
INTRAVENOUS | Status: AC
  Filled 2022-08-02: qty 20

## 2022-08-02 MED ORDER — LIDOCAINE HCL URETHRAL/MUCOSAL 2 % EX GEL
CUTANEOUS | Status: AC
  Filled 2022-08-02: qty 30

## 2022-08-02 MED ORDER — ASPIRIN 81 MG PO TABS: 81.0000 mg | ORAL_TABLET | Freq: Every day | ORAL | Status: AC

## 2022-08-02 MED ORDER — PROPOFOL 10 MG/ML IV BOLUS
INTRAVENOUS | Status: DC | PRN
  Administered 2022-08-02: 20 mg via INTRAVENOUS
  Administered 2022-08-02: 30 mg via INTRAVENOUS

## 2022-08-02 MED ORDER — PROPOFOL 500 MG/50ML IV EMUL
INTRAVENOUS | Status: AC
  Filled 2022-08-02: qty 50

## 2022-08-02 MED ORDER — LIDOCAINE HCL URETHRAL/MUCOSAL 2 % EX GEL
CUTANEOUS | Status: DC | PRN
  Administered 2022-08-02: 1

## 2022-08-02 MED ORDER — LACTATED RINGERS IV SOLN
INTRAVENOUS | Status: AC | PRN
  Administered 2022-08-02: 1000 mL via INTRAVENOUS

## 2022-08-02 MED ORDER — PROPOFOL 500 MG/50ML IV EMUL
INTRAVENOUS | Status: DC | PRN
  Administered 2022-08-02: 125 ug/kg/min via INTRAVENOUS

## 2022-08-02 MED ORDER — SODIUM CHLORIDE 0.9 % IV SOLN: INTRAVENOUS | Status: DC

## 2022-08-02 NOTE — Anesthesia Procedure Notes (Signed)
Procedure Name: MAC Date/Time: 08/02/2022 8:30 AM  Performed by: Lollie Sails, CRNAPre-anesthesia Checklist: Patient identified, Emergency Drugs available, Suction available, Patient being monitored and Timeout performed Oxygen Delivery Method: Simple face mask Placement Confirmation: positive ETCO2

## 2022-08-02 NOTE — Op Note (Signed)
[facility] Patient Name: Cory Harper Procedure Date: 08/02/2022 MRN: [id number] Attending MD: Justice Britain , MD, [id number] Date of Birth: 1956/07/20 CSN: [id number] Age: 66 Admit Type: Outpatient Procedure:                Pouchoscopy Indications:              History of familial polyposis, Follow-up for                            history of adenomatous polyps in the rectum, For                            therapy of adenomatous polyps in the rectum Providers:                Justice Britain, MD, Jaci Carrel, RN,                            Luan Moore, Technician, [address]. [name] CRNA,                            CRNA Referring MD:             Leighton Ruff, MD, Milus Banister, MD, Michael Boston MD, MD, Elveria Rising. Damita Dunnings, MD Medicines:                Monitored Anesthesia Care Complications:            No immediate complications. Estimated Blood Loss:     Estimated blood loss was minimal. Procedure:                Pre-Anesthesia Assessment:                           - Prior to the procedure, a History and Physical                            was performed, and patient medications and                            allergies were reviewed. The patient's tolerance of                            previous anesthesia was also reviewed. The risks                            and benefits of the procedure and the sedation                            options and risks were discussed with the patient.                            All questions were answered, and informed consent  was obtained. Prior Anticoagulants: The patient has                            taken no anticoagulant or antiplatelet agents                            except for aspirin. ASA Grade Assessment: III - A                            patient with severe systemic disease. After                            reviewing the risks and benefits, the patient  was                            deemed in satisfactory condition to undergo the                            procedure.                           After obtaining informed consent, the endoscope was                            passed under direct vision. Throughout the                            procedure, the patient's blood pressure, pulse, and                            oxygen saturations were monitored continuously. The                            [id number]-[id number] ([id number]) Olympus endoscope was introduced                            through the ileoanal anastomosis via the anus and                            advanced to the ileoanal pouch and into the                            neo-terminal ileum. The procedure was performed                            without difficulty. The patient tolerated the                            procedure. The quality of the bowel preparation was                            adequate. Scope In: 8:36:17 AM Scope Out: 9:34:09 AM Total Procedure Duration: 0 hours 57 minutes 52 seconds  Findings:      The perianal exam findings  include a perianal rash.      The digital rectal exam findings include rectal stricture/narrowing.      The neo-terminal ileum appeared normal.      There was an ileal pouch 25 cm from the anal verge. This was       characterized by healthy appearing mucosa and a single intact staple.       Removal of the staple was accomplished with a regular forceps.      The ileoanal pouch contained >100 sessile and semi-sessile, non-bleeding       polyps. The polyps were 1 to 10 mm in diameter. 80 of these polyps were       removed with a cold snare. Resection and retrieval were complete.      The area just proximal to the rectal cuff contained one semi-sessile,       non-bleeding polyp. The polyp was 25 mm in diameter. Preparations were       made for attempt at mucosal resection. Demarcation of the lesion was       performed with high-definition white light and  narrow band imaging to       clearly identify the boundaries of the lesion. Due to the lesion being       in a near cavity due to previous surgery, I could not get a needle into       that area. So decision was made to use [employer] EMR as an       attempt at resection. Piecemeal mucosal resection using a snare was       performed. Resection and retrieval were felt to be complete. Resected       tissue margins were examined and clear of polyp tissue.      A localized area of mucosa in the rectal cuff was granular - previously       biopsied as dysplastic. However, less of a significant concern on       appearance today than just a few months ago (after the pre-rectal cuff       polyp was removed). Biopsies were taken with a cold forceps for       histology purposes. Impression:               - Rectal narrowing/stricture found on digital                            rectal exam. Perianal rash noted.                           - The examined portion of the neo-terimal ileum was                            normal.                           - Ileal pouch with healthy appearing mucosa and an                            intact staple were seen. Staple removed.                           - >100 polyps in the ileoanal pouch. 80 of these  were removed today with a cold snare. Resected and                            retrieved.                           - One pre-rectal cuff polyp within a cavity (due to                            surgical changes) was removed with Underwater lift                            mucosal resection. Resected and retrieved.                           - Granularity at the rectal cuff. Biopsied. Recommendation:           - The patient will be observed post-procedure,                            until all discharge criteria are met.                           - Discharge patient to home.                           - Patient has a contact number  available for                            emergencies. The signs and symptoms of potential                            delayed complications were discussed with the                            patient. Return to normal activities tomorrow.                            Written discharge instructions were provided to the                            patient.                           - Resume previous diet.                           - Await pathology results.                           - Repeat post-surgical lower GI endoscopy in 3-4                            months for surveillance if no evidence of  malignancy is found.                           - Hold aspirin for next 72 hours.                           - Restart other medications as able.                           - The findings and recommendations were discussed                            with the patient.                           - The findings and recommendations were discussed                            with the patient's family. Procedure Code(s):        --- Professional ---                           [id number], Endoscopic evaluation of small intestinal                            pouch (eg, Kock pouch, ileal reservoir [S or J]);                            with biopsy, single or multiple                           44799, Unlisted procedure, small intestine Diagnosis Code(s):        --- Professional ---                           K62.4, Stenosis of anus and rectum                           Z98.0, Intestinal bypass and anastomosis status                           D13.39, Benign neoplasm of other parts of small                            intestine                           K63.89, Other specified diseases of intestine                           Z86.010, Personal history of colonic polyps                           D12.8, Benign neoplasm of rectum CPT copyright 2022 American [employer]. All rights reserved. The  codes documented in this report are preliminary and upon coder review may  be revised to meet current  compliance requirements. Justice Britain, MD 08/02/2022 10:02:44 AM Number of Addenda: 0

## 2022-08-02 NOTE — Anesthesia Postprocedure Evaluation (Signed)
Anesthesia Post Note  Patient: Cory Harper  Procedure(s) Performed: POUCHOSCOPY POLYPECTOMY FOREIGN BODY REMOVAL BIOPSY ENDOSCOPIC MUCOSAL RESECTION     Patient location during evaluation: Endoscopy Anesthesia Type: MAC Level of consciousness: awake and alert Pain management: pain level controlled Vital Signs Assessment: post-procedure vital signs reviewed and stable Respiratory status: spontaneous breathing, nonlabored ventilation, respiratory function stable and patient connected to nasal cannula oxygen Cardiovascular status: stable and blood pressure returned to baseline Postop Assessment: no apparent nausea or vomiting Anesthetic complications: no   No notable events documented.  Last Vitals:  Vitals:   08/02/22 0951 08/02/22 1001  BP: 126/73 128/74  Pulse: 64 77  Resp: 16 12  Temp:    SpO2: 96% 96%    Last Pain:  Vitals:   08/02/22 1001  TempSrc:   PainSc: 0-No pain                 Belenda Cruise P Wylma Tatem

## 2022-08-02 NOTE — Transfer of Care (Signed)
Immediate Anesthesia Transfer of Care Note  Patient: Cory Harper  Procedure(s) Performed: POUCHOSCOPY POLYPECTOMY FOREIGN BODY REMOVAL BIOPSY ENDOSCOPIC MUCOSAL RESECTION  Patient Location: PACU and Endoscopy Unit  Anesthesia Type:MAC  Level of Consciousness: awake, alert , and patient cooperative  Airway & Oxygen Therapy: Patient Spontanous Breathing and Patient connected to face mask oxygen  Post-op Assessment: Report given to RN and Post -op Vital signs reviewed and stable  Post vital signs: Reviewed and stable  Last Vitals:  Vitals Value Taken Time  BP 117/67 08/02/22 0941  Temp 36.9 C 08/02/22 0941  Pulse 70 08/02/22 0941  Resp 14 08/02/22 0942  SpO2 100 % 08/02/22 0941  Vitals shown include unvalidated device data.  Last Pain:  Vitals:   08/02/22 0941  TempSrc: Tympanic  PainSc: Asleep         Complications: No notable events documented.

## 2022-08-02 NOTE — Discharge Instructions (Signed)
YOU HAD AN ENDOSCOPIC PROCEDURE TODAY: Refer to the procedure report and other information in the discharge instructions given to you for any specific questions about what was found during the examination. If this information does not answer your questions, please call [place] office at [phone] to clarify.  ° °YOU SHOULD EXPECT: Some feelings of bloating in the abdomen. Passage of more gas than usual. Walking can help get rid of the air that was put into your GI tract during the procedure and reduce the bloating. If you had a lower endoscopy (such as a colonoscopy or flexible sigmoidoscopy) you may notice spotting of blood in your stool or on the toilet paper. Some abdominal soreness may be present for a day or two, also. ° °DIET: Your first meal following the procedure should be a light meal and then it is ok to progress to your normal diet. A half-sandwich or bowl of soup is an example of a good first meal. Heavy or fried foods are harder to digest and may make you feel nauseous or bloated. Drink plenty of fluids but you should avoid alcoholic beverages for 24 hours. If you had a esophageal dilation, please see attached instructions for diet.   ° °ACTIVITY: Your care partner should take you home directly after the procedure. You should plan to take it easy, moving slowly for the rest of the day. You can resume normal activity the day after the procedure however YOU SHOULD NOT DRIVE, use power tools, machinery or perform tasks that involve climbing or major physical exertion for 24 hours (because of the sedation medicines used during the test).  ° °SYMPTOMS TO REPORT IMMEDIATELY: °A gastroenterologist can be reached at any hour. Please call [phone]  for any of the following symptoms:  °Following lower endoscopy (colonoscopy, flexible sigmoidoscopy) °Excessive amounts of blood in the stool  °Significant tenderness, worsening of abdominal pains  °Swelling of the abdomen that is new, acute  °Fever of 100° or  higher  °Following upper endoscopy (EGD, EUS, ERCP, esophageal dilation) °Vomiting of blood or coffee ground material  °New, significant abdominal pain  °New, significant chest pain or pain under the shoulder blades  °Painful or persistently difficult swallowing  °New shortness of breath  °Black, tarry-looking or red, bloody stools ° °FOLLOW UP:  °If any biopsies were taken you will be contacted by phone or by letter within the next 1-3 weeks. Call [phone]  if you have not heard about the biopsies in 3 weeks.  °Please also call with any specific questions about appointments or follow up tests. ° °

## 2022-08-02 NOTE — H&P (Signed)
GASTROENTEROLOGY PROCEDURE H&P NOTE   Primary Care Physician: Tonia Ghent, MD  HPI: Cory Harper is a 66 y.o. 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  Past Medical History:  Diagnosis Date   Anemia    Arthritis    Cancer ([place]) 2013   stg II colon cancer s/p resection   Colonic mass    Diabetes mellitus    FAP (familial adenomatous polyposis)    Heart murmur    Hyperlipidemia    Hypertension    not on blood pressure meds since 11/13    Hypothyroidism    Nasal congestion    Past Surgical History:  Procedure Laterality Date   BIOPSY  01/15/2018   Procedure: BIOPSY;  Surgeon: Leighton Ruff, MD;  Location: [facility];  Service: Endoscopy;;   BIOPSY  07/10/2019   Procedure: BIOPSY;  Surgeon: Leighton Ruff, MD;  Location: [facility];  Service: General;;   BIOPSY  01/27/2021   Procedure: BIOPSY;  Surgeon: Leighton Ruff, MD;  Location: [facility];  Service: Endoscopy;;   BIOPSY  05/10/2022   Procedure: BIOPSY;  Surgeon: Irving Copas., MD;  Location: [facility];  Service: Gastroenterology;;   BOWEL RESECTION  05/20/2012   COLON RESECTION  05/20/2012   Procedure: COLON RESECTION LAPAROSCOPIC;  Surgeon: Adin Hector, MD;  Location: [facility];  Service: General;  Laterality: N/A;  Laparoscopic Proctocolectomy, Ileal Pouch Anal Anastomoisis, Loop Ileostomy   ESOPHAGOGASTRODUODENOSCOPY (EGD) WITH PROPOFOL N/A 04/23/2013   Procedure: ESOPHAGOGASTRODUODENOSCOPY (EGD) WITH PROPOFOL;  Surgeon: Milus Banister, MD;  Location: [facility];  Service: Endoscopy;  Laterality: N/A;  side viewing scope   FLEXIBLE SIGMOIDOSCOPY  08/01/2012   Procedure: FLEXIBLE SIGMOIDOSCOPY;  Surgeon: Leighton Ruff, MD;  Location: [facility];  Service: Endoscopy;  Laterality: N/A;  use endoscope   FLEXIBLE SIGMOIDOSCOPY N/A 11/29/2014    Procedure: FLEXIBLE SIGMOIDOSCOPY/POUCHOSCOPY;  Surgeon: Leighton Ruff, MD;  Location: [facility];  Service: Endoscopy;  Laterality: N/A;   FLEXIBLE SIGMOIDOSCOPY N/A 05/10/2022   Procedure: FLEXIBLE SIGMOIDOSCOPY;  Surgeon: Rush Landmark Telford Nab., MD;  Location: [facility];  Service: Gastroenterology;  Laterality: N/A;   ILEOSTOMY  05/20/12   ILEOSTOMY CLOSURE N/A 09/18/2012   Procedure: Loop Ileostomy Takedown with EUA ;  Surgeon: Adin Hector, MD;  Location: [facility];  Service: General;  Laterality: N/A;  loop ileostomy takedown with EUA    KNEE ARTHROSCOPY  1998   Right knee   POLYPECTOMY  05/10/2022   Procedure: POLYPECTOMY;  Surgeon: Rush Landmark Telford Nab., MD;  Location: [facility];  Service: Gastroenterology;;   POUCHOSCOPY  11/16/2013   Procedure: POUCHOSCOPY;  Surgeon: Leighton Ruff, MD;  Location: [facility];  Service: Endoscopy;;   POUCHOSCOPY N/A 11/16/2015   Procedure: POUCHOSCOPY;  Surgeon: Leighton Ruff, MD;  Location: [facility];  Service: Endoscopy;  Laterality: N/A;   POUCHOSCOPY N/A 12/20/2016   Procedure: POUCHOSCOPY;  Surgeon: Leighton Ruff, MD;  Location: [facility];  Service: Endoscopy;  Laterality: N/A;   POUCHOSCOPY N/A 01/15/2018   Procedure: POUCHOSCOPY;  Surgeon: Leighton Ruff, MD;  Location: [facility];  Service: Endoscopy;  Laterality: N/A;   POUCHOSCOPY N/A 07/10/2019   Procedure: POUCHOSCOPY;  Surgeon: Leighton Ruff, MD;  Location: [facility];  Service: General;  Laterality: N/A;   POUCHOSCOPY N/A 01/27/2021   Procedure: POUCHOSCOPY;  Surgeon: Leighton Ruff, MD;  Location: [facility];  Service: Endoscopy;  Laterality: N/A;  THYROID LOBECTOMY  05/20/2012   Procedure: THYROID LOBECTOMY;  Surgeon: Adin Hector, MD;  Location: [facility];  Service: General;  Laterality: Left;  LEFT THYROID LOBECTOMY   UPPER GASTROINTESTINAL ENDOSCOPY     WISDOM TOOTH EXTRACTION     Current Facility-Administered Medications  Medication Dose Route Frequency  Provider Last Rate Last Admin   0.9 %  sodium chloride infusion   Intravenous Continuous Mansouraty, Telford Nab., MD        Current Facility-Administered Medications:    0.9 %  sodium chloride infusion, , Intravenous, Continuous, Mansouraty, Telford Nab., MD Allergies  Allergen Reactions   Ibuprofen Swelling    Made his joints swell with high doses   Family History  Problem Relation Age of Onset   Uterine cancer Mother    Hypertension Mother    Stroke Mother    Colon cancer Mother    Colon polyps Mother    Cancer Mother        colon, endometrial   Diabetes Father    Obesity Father    Pneumonia Father    Cancer Father        skin - squamous    Breast cancer Sister    Diabetes Paternal Grandmother    Prostate cancer Neg Hx    Esophageal cancer Neg Hx    Social History   Socioeconomic History   Marital status: Married    Spouse name: June   Number of children: 0   Years of education: Not on file   Highest education level: Not on file  Occupational History   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[employer]    Comment: 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, 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  Tobacco Use   Smoking status: Former    Types: Cigarettes    Quit date: 08/07/1991    Years since quitting: 31.0   Smokeless tobacco: Never   Tobacco comments:    quit in 1993  Vaping Use   Vaping Use: Never used  Substance and Sexual Activity   Alcohol use: No   Drug use: No   Sexual activity: Not on file  Other Topics Concern   Not on file  Social History Narrative   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[employer])   Recruitment consultant for [employer]   Married 1996   No kids   Social Determinants of Radio broadcast assistant Strain: Not on file  Food Insecurity: Not on file  Transportation Needs: Not on file  Physical Activity: Not on file  Stress: Not on file  Social Connections: Not on file  Intimate Partner Violence: Not on file    Physical Exam: Today's  Vitals   08/02/22 0734  BP: (!) 158/67  Pulse: 94  Resp: 12  Temp: 98.4 F (36.9 C)  TempSrc: Tympanic  SpO2: 100%  Weight: 99.9 kg  Height: '5\' 11"'$  (1.803 m)  PainSc: 0-No pain   Body mass index is 30.72 kg/m. GEN: NAD EYE: Sclerae anicteric ENT: MMM CV: Non-tachycardic GI: Soft, NT/ND NEURO:  Alert & Oriented x 3  Lab Results: No results for input(s): "WBC", "HGB", "HCT", "PLT" in the last 72 hours. BMET No results for input(s): "NA", "K", "CL", "CO2", "GLUCOSE", "BUN", "CREATININE", "CALCIUM" in the last 72 hours. LFT No results for input(s): "PROT", "ALBUMIN", "AST", "ALT", "ALKPHOS", "BILITOT", "BILIDIR", "IBILI" in the last 72 hours. PT/INR No results for input(s): "LABPROT", "INR" in the last 72 hours.   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  The risks and benefits of endoscopic evaluation/treatment were discussed with the patient and/or family; these include but are not limited to the risk of perforation, infection, bleeding, missed lesions, lack of diagnosis, severe illness requiring hospitalization, as well as anesthesia and sedation related illnesses.  The patient's history has been reviewed, patient examined, no change in status, and deemed stable for procedure.  The patient and/or family is agreeable to proceed.    Justice Britain, MD [facility] Office # [phone]

## 2022-08-03 ENCOUNTER — Encounter (HOSPITAL_COMMUNITY): Payer: Self-pay | Admitting: Gastroenterology

## 2022-08-03 LAB — SURGICAL PATHOLOGY

## 2022-08-04 ENCOUNTER — Encounter: Payer: Self-pay | Admitting: Gastroenterology

## 2022-08-06 ENCOUNTER — Encounter: Payer: Self-pay | Admitting: Emergency Medicine

## 2022-08-06 ENCOUNTER — Ambulatory Visit
Admission: EM | Admit: 2022-08-06 | Discharge: 2022-08-06 | Disposition: A | Discharge status: Home / Self Care | Payer: BC Managed Care – PPO | Attending: Urgent Care | Admitting: Urgent Care

## 2022-08-06 ENCOUNTER — Other Ambulatory Visit: Payer: Self-pay

## 2022-08-06 DIAGNOSIS — L03317 Cellulitis of buttock: Secondary | ICD-10-CM

## 2022-08-06 DIAGNOSIS — L0231 Cutaneous abscess of buttock: Secondary | ICD-10-CM

## 2022-08-06 MED ORDER — SULFAMETHOXAZOLE-TRIMETHOPRIM 800-160 MG PO TABS: 1.0000 | ORAL_TABLET | Freq: Two times a day (BID) | ORAL | 0 refills | Status: DC

## 2022-08-06 MED ORDER — SULFAMETHOXAZOLE-TRIMETHOPRIM 800-160 MG PO TABS: 1.0000 | ORAL_TABLET | Freq: Two times a day (BID) | ORAL | 0 refills | Status: AC

## 2022-08-06 NOTE — Discharge Instructions (Addendum)
Please follow-up as soon as possible with a GI or general surgeon to evaluate the source of the bacteria.

## 2022-08-06 NOTE — ED Provider Notes (Signed)
Cory Harper    CSN: [id number] Arrival date & time: 08/06/22  0816      History   Chief Complaint Chief Complaint  Patient presents with   Abscess    HPI Cory Harper is a 67 y.o. male.    Abscess   Patient is accompanied by his wife.  Presents to urgent care with swelling, redness and drainage from his buttock, right side.  He states he first noticed a "bump" last week.  He has been using Neosporin at the site and warm compresses.  Patient is status post proctocolectomy with J-pouch in place following colon cancer.  He endorses having bowel movements.  Through his rectum.  PMH includes DM 2 which does not appear to be very well managed based on 01/05/2022 hemoglobin A1c of 11.1.  His PCP calculates A1c from fructosamine and most recently calculated at 9.2%, which is worse than previously calculated.  Past Medical History:  Diagnosis Date   Anemia    Arthritis    Cancer ([place]) 2013   stg II colon cancer s/p resection   Colonic mass    Diabetes mellitus    FAP (familial adenomatous polyposis)    Heart murmur    Hyperlipidemia    Hypertension    not on blood pressure meds since 11/13    Hypothyroidism    Nasal congestion     Patient Active Problem List   Diagnosis Date Noted   Knee abrasion 06/24/2022   History of anemia 12/28/2020   Diabetes mellitus without complication ([place]) 08/65/7846   Routine general medical examination at a health care facility 06/22/2015   Advance care planning 06/22/2015   Hypothyroidism 06/25/2012   History of colon cancer 05/20/2012   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   Pure hypercholesterolemia 01/30/2012   B12 deficiency 09/27/2010   UNSPECIFIED VITAMIN D DEFICIENCY 09/27/2010   HYPERTENSION, BENIGN ESSENTIAL 08/04/2007   Obesity (BMI 30-39.9) 02/27/2007   ECZEMA 02/27/2007   Familial adenomatous polyposis coli, s/p proctocolectomy/J pouch 05/20/2012 02/27/2007     Past Surgical History:  Procedure Laterality Date   BIOPSY  01/15/2018   Procedure: BIOPSY;  Surgeon: Leighton Ruff, MD;  Location: [facility];  Service: Endoscopy;;   BIOPSY  07/10/2019   Procedure: BIOPSY;  Surgeon: Leighton Ruff, MD;  Location: [facility];  Service: General;;   BIOPSY  01/27/2021   Procedure: BIOPSY;  Surgeon: Leighton Ruff, MD;  Location: [facility];  Service: Endoscopy;;   BIOPSY  05/10/2022   Procedure: BIOPSY;  Surgeon: Irving Copas., MD;  Location: [facility];  Service: Gastroenterology;;   BIOPSY  08/02/2022   Procedure: BIOPSY;  Surgeon: Irving Copas., MD;  Location: [facility];  Service: Gastroenterology;;   BOWEL RESECTION  05/20/2012   COLON RESECTION  05/20/2012   Procedure: COLON RESECTION LAPAROSCOPIC;  Surgeon: Adin Hector, MD;  Location: [facility];  Service: General;  Laterality: N/A;  Laparoscopic Proctocolectomy, Ileal Pouch Anal Anastomoisis, Loop Ileostomy   ENDOSCOPIC MUCOSAL RESECTION  08/02/2022   Procedure: ENDOSCOPIC MUCOSAL RESECTION;  Surgeon: Irving Copas., MD;  [facility]: [facility];  Service: Gastroenterology;;   ESOPHAGOGASTRODUODENOSCOPY (EGD) WITH PROPOFOL N/A 04/23/2013   Procedure: ESOPHAGOGASTRODUODENOSCOPY (EGD) WITH PROPOFOL;  Surgeon: Milus Banister, MD;  Location: [facility];  Service: Endoscopy;  Laterality: N/A;  side viewing scope   FLEXIBLE SIGMOIDOSCOPY  08/01/2012   Procedure: FLEXIBLE SIGMOIDOSCOPY;  Surgeon: Leighton Ruff, MD;  [facility]: [facility];  Service: Endoscopy;  Laterality: N/A;  use endoscope   FLEXIBLE SIGMOIDOSCOPY N/A 11/29/2014   Procedure: FLEXIBLE SIGMOIDOSCOPY/POUCHOSCOPY;  Surgeon: Leighton Ruff, MD;  [facility];  Service: Endoscopy;  Laterality: N/A;   FLEXIBLE SIGMOIDOSCOPY N/A 05/10/2022   Procedure: FLEXIBLE SIGMOIDOSCOPY;  Surgeon: Rush Landmark Telford Nab., MD;  Location[facility];  Service: Gastroenterology;  Laterality: N/A;   FOREIGN  BODY REMOVAL  08/02/2022   Procedure: FOREIGN BODY REMOVAL;  Surgeon: Rush Landmark Telford Nab., MD;  Location: [facility];  Service: Gastroenterology;;   ILEOSTOMY  05/20/12   ILEOSTOMY CLOSURE N/A 09/18/2012   Procedure: Loop Ileostomy Takedown with EUA ;  Surgeon: Adin Hector, MD;  Location: [facility];  Service: General;  Laterality: N/A;  loop ileostomy takedown with EUA    KNEE ARTHROSCOPY  1998   Right knee   POLYPECTOMY  05/10/2022   Procedure: POLYPECTOMY;  Surgeon: Irving Copas., MD;  Location: [facility];  Service: Gastroenterology;;   POLYPECTOMY  08/02/2022   Procedure: POLYPECTOMY;  Surgeon: Irving Copas., MD;  Location: [facility];  Service: Gastroenterology;;   POUCHOSCOPY  11/16/2013   Procedure: POUCHOSCOPY;  Surgeon: Leighton Ruff, MD;  [facility];  Service: Endoscopy;;   POUCHOSCOPY N/A 11/16/2015   Procedure: POUCHOSCOPY;  Surgeon: Leighton Ruff, MD;  [facility];  Service: Endoscopy;  Laterality: N/A;   POUCHOSCOPY N/A 12/20/2016   Procedure: POUCHOSCOPY;  Surgeon: Leighton Ruff, MD;  [facility];  Service: Endoscopy;  Laterality: N/A;   POUCHOSCOPY N/A 01/15/2018   Procedure: POUCHOSCOPY;  Surgeon: Leighton Ruff, MD;  [facility];  Service: Endoscopy;  Laterality: N/A;   POUCHOSCOPY N/A 07/10/2019   Procedure: POUCHOSCOPY;  Surgeon: Leighton Ruff, MD;  Location: [facility];  Service: General;  Laterality: N/A;   POUCHOSCOPY N/A 01/27/2021   Procedure: POUCHOSCOPY;  Surgeon: Leighton Ruff, MD;  [facility];  Service: Endoscopy;  Laterality: N/A;   POUCHOSCOPY N/A 08/02/2022   Procedure: POUCHOSCOPY;  Surgeon: Mansouraty, Telford Nab., MD;  Location: [facility];  Service: Gastroenterology;  Laterality: N/A;   THYROID LOBECTOMY  05/20/2012   Procedure: THYROID LOBECTOMY;  Surgeon: Adin Hector, MD;  Location: [facility];  Service: General;  Laterality: Left;  LEFT THYROID LOBECTOMY   UPPER  GASTROINTESTINAL ENDOSCOPY     WISDOM TOOTH EXTRACTION         Home Medications    Prior to Admission medications   Medication Sig Start Date End Date Taking? Authorizing Provider  acetaminophen (TYLENOL) 500 MG tablet Take 1,000 mg by mouth every 6 (six) hours as needed for moderate pain.    [provider]  aspirin 81 MG tablet Take 1 tablet (81 mg total) by mouth daily. 08/05/22   Mansouraty, Telford Nab., MD  atorvastatin (LIPITOR) 20 MG tablet TAKE 1 TABLET(20 MG) BY MOUTH DAILY Patient taking differently: Take 20 mg by mouth at bedtime. 12/04/21   Tonia Ghent, MD  Blood Glucose Monitoring Suppl (ONE TOUCH ULTRA SYSTEM KIT) W/DEVICE KIT Dispense 1 kit with 100 lancets and strips, with 3 rf on lancets and strips.  Check sugar daily.  Dx E11.9 06/30/15   Tonia Ghent, MD  cholecalciferol (VITAMIN D) 1000 UNITS tablet Take 1,000 Units by mouth in the morning.    [provider]  cyanocobalamin (VITAMIN B12) 1000 MCG tablet Take 1 tablet (1,000 mcg total) by mouth once a week. Patient taking differently: Take 1,000 mcg by mouth every Saturday. 07/10/22   Tonia Ghent, MD  ferrous sulfate (FEROSUL) 325 (65  FE) MG tablet Take 1 tablet (325 mg total) by mouth daily. Patient taking differently: Take 325 mg by mouth at bedtime. 06/21/22   Tonia Ghent, MD  fluticasone (FLONASE) 50 MCG/ACT nasal spray SHAKE LIQUID AND USE 2 SPRAYS IN EACH NOSTRIL DAILY Patient taking differently: Place 2 sprays into both nostrils daily as needed for allergies. 03/08/22   Tonia Ghent, MD  glimepiride (AMARYL) 2 MG tablet TAKE 2 TABLETS BY MOUTH IN THE MORNING THEN TAKE 1 TABLET BY MOUTH IN THE EVENING 09/20/21   Tonia Ghent, MD  JARDIANCE 10 MG TABS tablet TAKE 1 TABLET BY MOUTH DAILY Patient taking differently: Take 10 mg by mouth at bedtime. 10/12/21   Tonia Ghent, MD  levothyroxine (SYNTHROID) 100 MCG tablet TAKE 1 TABLET(100 MCG) BY MOUTH EVERY MORNING 10/12/21   Tonia Ghent, MD  loperamide (ANTI-DIARRHEAL) 2 MG tablet TAKE 1-2 TABLETS BY MOUTH TWICE DAILY AS NEEDED Patient taking differently: Take 2 mg by mouth in the morning and at bedtime. 10/20/20   Tonia Ghent, MD  loratadine (CLARITIN) 10 MG tablet Take 10 mg by mouth in the morning.    [provider]  metFORMIN (GLUCOPHAGE) 1000 MG tablet TAKE 2 TABLETS BY MOUTH DAILY Patient taking differently: Take 1,000 mg by mouth daily with supper. TAKE 2 TABLETS BY MOUTH DAILY 10/12/21   Tonia Ghent, MD  [facility] ULTRA test strip USE AS DIRECTED TO TEST BLOOD GLUCOSE DAILY 06/04/22   Tonia Ghent, MD  Polyethyl Glycol-Propyl Glycol (LUBRICANT EYE DROPS) 0.4-0.3 % SOLN Place 1-2 drops into both eyes daily as needed (dry/irritated eyes.).    [provider]  tirzepatide Darcel Bayley) 5 MG/0.5ML Pen Inject 5 mg into the skin once a week. 07/20/22   Philemon Kingdom, MD  TRULICITY 4.5 ZE/0.9QZ SOPN INJECT 4.5MG ONCE A WEEK AS DIRECTED Patient taking differently: Inject 4.5 mg into the skin every Saturday. 12/21/21   Philemon Kingdom, MD    Family History Family History  Problem Relation Age of Onset   Uterine cancer Mother    Hypertension Mother    Stroke Mother    Colon cancer Mother    Colon polyps Mother    Cancer Mother        colon, endometrial   Diabetes Father    Obesity Father    Pneumonia Father    Cancer Father        skin - squamous    Breast cancer Sister    Diabetes Paternal Grandmother    Prostate cancer Neg Hx    Esophageal cancer Neg Hx     Social History Social History   Tobacco Use   Smoking status: Former    Types: Cigarettes    Quit date: 08/07/1991    Years since quitting: 31.0   Smokeless tobacco: Never   Tobacco comments:    quit in 1993  Vaping Use   Vaping Use: Never used  Substance Use Topics   Alcohol use: No   Drug use: No     Allergies   Ibuprofen   Review of Systems Review of Systems   Physical Exam Triage Vital  Signs ED Triage Vitals  Enc Vitals Group     BP 08/06/22 0827 120/70     Pulse Rate 08/06/22 0827 (!) 107     Resp 08/06/22 0827 16     Temp 08/06/22 0827 97.9 F (36.6 C)     Temp Source 08/06/22 0827 Temporal  SpO2 08/06/22 0827 95 %     Weight --      Height --      Head Circumference --      Peak Flow --      Pain Score 08/06/22 0836 2     Pain Loc --      Pain Edu? --      Excl. in [place]? --    No data found.  Updated Vital Signs BP 120/70 (BP Location: Left Arm)   Pulse (!) 107   Temp 97.9 F (36.6 C) (Temporal)   Resp 16   SpO2 95%   Visual Acuity Right Eye Distance:   Left Eye Distance:   Bilateral Distance:    Right Eye Near:   Left Eye Near:    Bilateral Near:     Physical Exam   UC Treatments / Results  Labs (all labs ordered are listed, but only abnormal results are displayed) Labs Reviewed - No data to display  EKG   Radiology No results found.  Procedures Incision and Drainage  Date/Time: 08/06/2022 8:59 AM  Performed by: Rose Phi, FNP Authorized by: Rose Phi, FNP   Consent:    Consent obtained:  Verbal   Consent given by:  Patient and spouse   Risks discussed:  Bleeding   Alternatives discussed:  No treatment Universal protocol:    Procedure explained and questions answered to patient or proxy's satisfaction: yes     Relevant documents present and verified: yes     Test results available : no     Imaging studies available: no     Required blood products, implants, devices, and special equipment available: no     Site/side marked: no     Immediately prior to procedure, a time out was called: yes     Patient identity confirmed:  Verbally with patient Location:    Type:  Abscess   Size:  3 cm   Location:  Anogenital   Anogenital location:  Gluteal cleft Pre-procedure details:    Skin preparation:  Povidone-iodine Sedation:    Sedation type:  None Anesthesia:    Anesthesia method:  Local infiltration    Local anesthetic:  Lidocaine 1% WITH epi Procedure type:    Complexity:  Simple Procedure details:    Ultrasound guidance: no     Needle aspiration: no     Incision types:  Stab incision   Incision depth:  Dermal   Wound management:  Probed and deloculated   Drainage:  Purulent   Drainage amount:  Copious   Wound treatment:  Wound left open   Packing materials:  None Post-procedure details:    Procedure completion:  Tolerated Comments:     Drainage is copious amounts of red-brown creamy appearing liquid.    (including critical care time)  Medications Ordered in UC Medications - No data to display  Initial Impression / Assessment and Plan / UC Course  I have reviewed the triage vital signs and the nursing notes.  Pertinent labs & imaging results that were available during my care of the patient were reviewed by me and considered in my medical decision making (see chart for details).   See procedure note.  Concern for possible communicating abscess/fistula given color/odor/consistency of drainage. Will provide bactrim x 10 days and recommend patient be seen by gen surg vs GI surgery to assess source of infection. Swab of wound was obtained to attempt to isolate bacteria.   Final Clinical Impressions(s) / UC Diagnoses  Final diagnoses:  None   Discharge Instructions   None    ED Prescriptions   None    PDMP not reviewed this encounter.   Rose Phi, [place] 08/06/22 [phone]

## 2022-08-06 NOTE — ED Triage Notes (Signed)
Abscess on right buttocks and is reported as draining.  Bump noticed last Wednesday.  Using neaosprin and warm compresses

## 2022-08-08 ENCOUNTER — Telehealth: Payer: Self-pay | Admitting: Gastroenterology

## 2022-08-08 NOTE — Telephone Encounter (Signed)
I have made the pt aware that our schedule is not our that far as of today. We will contact him as soon as the schedule allows.  He is in the system for a recall.

## 2022-08-08 NOTE — Telephone Encounter (Signed)
Patient is requesting to schedule his Flex Sig at the Hospital. Patient requested from March 25th through to the April 1st since he is off of work that week and will not have to take time off.

## 2022-08-10 ENCOUNTER — Encounter: Payer: Self-pay | Admitting: Family Medicine

## 2022-08-10 ENCOUNTER — Ambulatory Visit: Payer: BC Managed Care – PPO | Admitting: Family Medicine

## 2022-08-10 VITALS — BP 120/60 | HR 85 | Temp 97.8°F | Ht 71.0 in | Wt 218.0 lb

## 2022-08-10 DIAGNOSIS — L0231 Cutaneous abscess of buttock: Secondary | ICD-10-CM

## 2022-08-10 LAB — AEROBIC CULTURE W GRAM STAIN (SUPERFICIAL SPECIMEN)

## 2022-08-10 NOTE — Patient Instructions (Signed)
Keep going with warm compresses and septra.  If you don't hear from surgery in the meantime, then let me know on Monday.  Keep taking the septra in the meantime.  Take care.  Glad to see you.

## 2022-08-10 NOTE — Progress Notes (Signed)
Abscess on right buttock, near the rectum. Seen at [facility] on Monday and was lanced and drained some; still draining now. On bactrim x 5 days so far and has 5 more days left. No pain now.  No fevers.    Still on trulicity, hasn't started mounjaro yet.  He is checking on cost with the mounjaro.    Meds, vitals, and allergies reviewed.   ROS: Per HPI unless specifically indicated in ROS section   Nad Ncat ~3 cm area of granulation tissue, appears clean.  No fluctuant mass. No spreading erythema.  Just to the R of the rectum

## 2022-08-11 DIAGNOSIS — L0231 Cutaneous abscess of buttock: Secondary | ICD-10-CM | POA: Insufficient documentation

## 2022-08-11 NOTE — Assessment & Plan Note (Signed)
The concern is for potential fistula formation/proximity to the rectum.  Anatomy discussed with patient. Keep going with warm compresses and septra.  I sent a note to the surgery clinic and they are going to get him seen in the near future.  I greatly appreciate the help of all involved, especially the GI clinic in the surgery clinic. Keep taking the septra in the meantime.  At this point still okay for outpatient follow-up.

## 2022-09-11 ENCOUNTER — Other Ambulatory Visit: Payer: Self-pay | Admitting: Family Medicine

## 2022-10-02 ENCOUNTER — Other Ambulatory Visit: Payer: Self-pay | Admitting: Family Medicine

## 2022-10-11 ENCOUNTER — Other Ambulatory Visit: Payer: Self-pay | Admitting: Family Medicine

## 2022-10-12 ENCOUNTER — Ambulatory Visit
Admission: EM | Admit: 2022-10-12 | Discharge: 2022-10-12 | Disposition: A | Discharge status: Home / Self Care | Payer: BC Managed Care – PPO | Attending: Emergency Medicine | Admitting: Emergency Medicine

## 2022-10-12 DIAGNOSIS — L03012 Cellulitis of left finger: Secondary | ICD-10-CM | POA: Diagnosis not present

## 2022-10-12 MED ORDER — DOXYCYCLINE HYCLATE 100 MG PO CAPS: 100.0000 mg | ORAL_CAPSULE | Freq: Two times a day (BID) | ORAL | 0 refills | Status: AC

## 2022-10-12 NOTE — Discharge Instructions (Signed)
Take the doxycycline as directed.    Keep your wound clean and dry.  Wash it gently twice a day with soap and water.  Apply an antibiotic cream twice a day.    Follow up if you see signs of infection, such as increased pain, redness, pus-like drainage, warmth, fever, chills, or other concerning symptoms.

## 2022-10-12 NOTE — ED Provider Notes (Signed)
Cory Harper    CSN: [id number] Arrival date & time: 10/12/22  1828      History   Chief Complaint Chief Complaint  Patient presents with   Finger Injury    HPI Cory Harper is a 67 y.o. male.  Patient presents with 1 week history of pain, redness, swelling, drainage of his left middle finger beside the fingernail.  The area has been draining pus intermittently.   He has been treating this with peroxide and Neosporin.  He denies fever, chills, numbness, weakness, or other symptoms.  Patient was treated with Bactrim on 08/06/2022 for abscess of his buttock; he is being followed by general surgeon for this, last seen on 10/02/2022.  His medical history includes diabetes.    The history is provided by the patient and medical records.    Past Medical History:  Diagnosis Date   Anemia    Arthritis    Cancer ([place]) 2013   stg II colon cancer s/p resection   Colonic mass    Diabetes mellitus    FAP (familial adenomatous polyposis)    Heart murmur    Hyperlipidemia    Hypertension    not on blood pressure meds since 11/13    Hypothyroidism    Nasal congestion     Patient Active Problem List   Diagnosis Date Noted   Abscess of buttock 08/11/2022   Knee abrasion 06/24/2022   History of anemia 12/28/2020   Diabetes mellitus without complication ([place]) [id number]   Routine general medical examination at a health care facility 06/22/2015   Advance care planning 06/22/2015   Hypothyroidism 06/25/2012   History of colon cancer 05/20/2012   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   Pure hypercholesterolemia 01/30/2012   B12 deficiency 09/27/2010   UNSPECIFIED VITAMIN D DEFICIENCY 09/27/2010   HYPERTENSION, BENIGN ESSENTIAL 08/04/2007   Obesity (BMI 30-39.9) 02/27/2007   ECZEMA 02/27/2007   Familial adenomatous polyposis coli, s/p proctocolectomy/J pouch 05/20/2012 02/27/2007    Past Surgical History:  Procedure  Laterality Date   BIOPSY  01/15/2018   Procedure: BIOPSY;  Surgeon: Leighton Ruff, MD;  Location: [facility];  Service: Endoscopy;;   BIOPSY  07/10/2019   Procedure: BIOPSY;  Surgeon: Leighton Ruff, MD;  Location: [facility];  Service: General;;   BIOPSY  01/27/2021   Procedure: BIOPSY;  Surgeon: Leighton Ruff, MD;  [facility]: [facility];  Service: Endoscopy;;   BIOPSY  05/10/2022   Procedure: BIOPSY;  Surgeon: Irving Copas., MD;  Location: [facility];  Service: Gastroenterology;;   BIOPSY  08/02/2022   Procedure: BIOPSY;  Surgeon: Irving Copas., MD;  [facility]: [facility];  Service: Gastroenterology;;   BOWEL RESECTION  05/20/2012   COLON RESECTION  05/20/2012   Procedure: COLON RESECTION LAPAROSCOPIC;  Surgeon: Adin Hector, MD;  Location: [facility];  Service: General;  Laterality: N/A;  Laparoscopic Proctocolectomy, Ileal Pouch Anal Anastomoisis, Loop Ileostomy   ENDOSCOPIC MUCOSAL RESECTION  08/02/2022   Procedure: ENDOSCOPIC MUCOSAL RESECTION;  Surgeon: Irving Copas., MD;  [facility];  Service: Gastroenterology;;   ESOPHAGOGASTRODUODENOSCOPY (EGD) WITH PROPOFOL N/A 04/23/2013   Procedure: ESOPHAGOGASTRODUODENOSCOPY (EGD) WITH PROPOFOL;  Surgeon: Milus Banister, MD;  [facility];  Service: Endoscopy;  Laterality: N/A;  side viewing scope   FLEXIBLE SIGMOIDOSCOPY  08/01/2012   Procedure: FLEXIBLE SIGMOIDOSCOPY;  Surgeon: Leighton Ruff, MD;  [facility];  Service: Endoscopy;  Laterality: N/A;  use endoscope   FLEXIBLE  SIGMOIDOSCOPY N/A 11/29/2014   Procedure: FLEXIBLE SIGMOIDOSCOPY/POUCHOSCOPY;  Surgeon: Leighton Ruff, MD;  [facility];  Service: Endoscopy;  Laterality: N/A;   FLEXIBLE SIGMOIDOSCOPY N/A 05/10/2022   Procedure: FLEXIBLE SIGMOIDOSCOPY;  Surgeon: Rush Landmark Telford Nab., MD;  Location[facility];  Service: Gastroenterology;  Laterality: N/A;   FOREIGN BODY REMOVAL  08/02/2022   Procedure:  FOREIGN BODY REMOVAL;  Surgeon: Rush Landmark Telford Nab., MD;  Location: [facility];  Service: Gastroenterology;;   ILEOSTOMY  05/20/12   ILEOSTOMY CLOSURE N/A 09/18/2012   Procedure: Loop Ileostomy Takedown with EUA ;  Surgeon: Adin Hector, MD;  [facility];  Service: General;  Laterality: N/A;  loop ileostomy takedown with EUA    KNEE ARTHROSCOPY  1998   Right knee   POLYPECTOMY  05/10/2022   Procedure: POLYPECTOMY;  Surgeon: Irving Copas., MD;  Location: [facility];  Service: Gastroenterology;;   POLYPECTOMY  08/02/2022   Procedure: POLYPECTOMY;  Surgeon: Irving Copas., MD;  Location: [facility];  Service: Gastroenterology;;   POUCHOSCOPY  11/16/2013   Procedure: POUCHOSCOPY;  Surgeon: Leighton Ruff, MD;  [facility];  Service: Endoscopy;;   POUCHOSCOPY N/A 11/16/2015   Procedure: POUCHOSCOPY;  Surgeon: Leighton Ruff, MD;  [facility];  Service: Endoscopy;  Laterality: N/A;   POUCHOSCOPY N/A 12/20/2016   Procedure: POUCHOSCOPY;  Surgeon: Leighton Ruff, MD;  [facility];  Service: Endoscopy;  Laterality: N/A;   POUCHOSCOPY N/A 01/15/2018   Procedure: POUCHOSCOPY;  Surgeon: Leighton Ruff, MD;  [facility];  Service: Endoscopy;  Laterality: N/A;   POUCHOSCOPY N/A 07/10/2019   Procedure: POUCHOSCOPY;  Surgeon: Leighton Ruff, MD;  [facility];  Service: General;  Laterality: N/A;   POUCHOSCOPY N/A 01/27/2021   Procedure: POUCHOSCOPY;  Surgeon: Leighton Ruff, MD;  [facility];  Service: Endoscopy;  Laterality: N/A;   POUCHOSCOPY N/A 08/02/2022   Procedure: POUCHOSCOPY;  Surgeon: Mansouraty, Telford Nab., MD;  Location: [facility];  Service: Gastroenterology;  Laterality: N/A;   THYROID LOBECTOMY  05/20/2012   Procedure: THYROID LOBECTOMY;  Surgeon: Adin Hector, MD;  [facility];  Service: General;  Laterality: Left;  LEFT THYROID LOBECTOMY   UPPER GASTROINTESTINAL ENDOSCOPY     WISDOM  TOOTH EXTRACTION         Home Medications    Prior to Admission medications   Medication Sig Start Date End Date Taking? Authorizing Provider  doxycycline (VIBRAMYCIN) 100 MG capsule Take 1 capsule (100 mg total) by mouth 2 (two) times daily for 7 days. 10/12/22 10/19/22 Yes Sharion Balloon, NP  acetaminophen (TYLENOL) 500 MG tablet Take 1,000 mg by mouth every 6 (six) hours as needed for moderate pain.    [provider]  aspirin 81 MG tablet Take 1 tablet (81 mg total) by mouth daily. 08/05/22   Mansouraty, Telford Nab., MD  atorvastatin (LIPITOR) 20 MG tablet TAKE 1 TABLET(20 MG) BY MOUTH DAILY 12/04/21   Tonia Ghent, MD  Blood Glucose Monitoring Suppl (ONE TOUCH ULTRA SYSTEM KIT) W/DEVICE KIT Dispense 1 kit with 100 lancets and strips, with 3 rf on lancets and strips.  Check sugar daily.  Dx E11.9 06/30/15   Tonia Ghent, MD  cholecalciferol (VITAMIN D) 1000 UNITS tablet Take 1,000 Units by mouth in the morning.    [provider]  cyanocobalamin (VITAMIN B12) 1000 MCG tablet Take 1 tablet (1,000 mcg total) by mouth once a week. 07/10/22   Tonia Ghent, MD  ferrous sulfate (FEROSUL) 325 (65  FE) MG tablet Take 1 tablet (325 mg total) by mouth daily. 06/21/22   Tonia Ghent, MD  fluticasone [facility]) 50 MCG/ACT nasal spray SHAKE LIQUID AND USE 2 SPRAYS IN [facility] NOSTRIL DAILY 10/03/22   Tonia Ghent, MD  glimepiride (AMARYL) 2 MG tablet TAKE 2 TABLETS BY MOUTH IN THE MORNING THEN TAKE 1 TABLET BY MOUTH IN THE EVENING 09/12/22   Tonia Ghent, MD  JARDIANCE 10 MG TABS tablet TAKE 1 TABLET BY MOUTH DAILY 10/12/21   Tonia Ghent, MD  levothyroxine (SYNTHROID) 100 MCG tablet TAKE 1 TABLET(100 MCG) BY MOUTH EVERY MORNING 10/11/22   Tonia Ghent, MD  loperamide (ANTI-DIARRHEAL) 2 MG tablet TAKE 1-2 TABLETS BY MOUTH TWICE DAILY AS NEEDED 10/20/20   Tonia Ghent, MD  loratadine (CLARITIN) 10 MG tablet Take 10 mg by mouth in the morning.    [provider]   metFORMIN (GLUCOPHAGE) 1000 MG tablet TAKE 2 TABLETS BY MOUTH DAILY 10/03/22   Tonia Ghent, MD  [facility] ULTRA test strip USE AS DIRECTED TO TEST BLOOD GLUCOSE DAILY 06/04/22   Tonia Ghent, MD  tirzepatide [facility]) 5 MG/0.5ML Pen Inject 5 mg into the skin once a week. 07/20/22   Philemon Kingdom, MD  TRULICITY 4.5 0000000 SOPN INJECT 4.'5MG'$  ONCE A WEEK AS DIRECTED Patient taking differently: Inject 4.5 mg into the skin every Saturday. 12/21/21   Philemon Kingdom, MD    Family History Family History  Problem Relation Age of Onset   Uterine cancer Mother    Hypertension Mother    Stroke Mother    Colon cancer Mother    Colon polyps Mother    Cancer Mother        colon, endometrial   Diabetes Father    Obesity Father    Pneumonia Father    Cancer Father        skin - squamous    Breast cancer Sister    Diabetes Paternal Grandmother    Prostate cancer Neg Hx    Esophageal cancer Neg Hx     Social History Social History   Tobacco Use   Smoking status: Former    Types: Cigarettes    Quit date: 08/07/1991    Years since quitting: 31.2   Smokeless tobacco: Never   Tobacco comments:    quit in 1993  Vaping Use   Vaping Use: Never used  Substance Use Topics   Alcohol use: No   Drug use: No     Allergies   Ibuprofen   Review of Systems Review of Systems  Constitutional:  Negative for chills and fever.  Musculoskeletal:  Negative for arthralgias and joint swelling.  Skin:  Positive for color change and wound.  Neurological:  Negative for weakness and numbness.  All other systems reviewed and are negative.    Physical Exam Triage Vital Signs ED Triage Vitals [10/12/22 1838]  Enc Vitals Group     BP      Pulse Rate 96     Resp 18     Temp 98.1 F (36.7 C)     Temp src      SpO2 96 %     Weight      Height      Head Circumference      Peak Flow      Pain Score      Pain Loc      Pain Edu?      Excl. in [place]?  No data found.  Updated  Vital Signs BP 138/80   Pulse 96   Temp 98.1 F (36.7 C)   Resp 18   SpO2 96%   Visual Acuity Right Eye Distance:   Left Eye Distance:   Bilateral Distance:    Right Eye Near:   Left Eye Near:    Bilateral Near:     Physical Exam Vitals and nursing note reviewed.  Constitutional:      General: He is not in acute distress.    Appearance: He is well-developed. He is not ill-appearing.  HENT:     Mouth/Throat:     Mouth: Mucous membranes are moist.  Eyes:     Conjunctiva/sclera: Conjunctivae normal.  Cardiovascular:     Rate and Rhythm: Normal rate and regular rhythm.  Pulmonary:     Effort: Pulmonary effort is normal. No respiratory distress.  Musculoskeletal:     Cervical back: Neck supple.  Skin:    General: Skin is warm and dry.     Findings: Erythema and lesion present.     Comments: Left middle finger: small tender crusted lesion beside fingernail with surrounding erythema. No drainage currently.   Neurological:     General: No focal deficit present.     Mental Status: He is alert and oriented to person, place, and time.     Sensory: No sensory deficit.     Motor: No weakness.  Psychiatric:        Mood and Affect: Mood normal.        Behavior: Behavior normal.      UC Treatments / Results  Labs (all labs ordered are listed, but only abnormal results are displayed) Labs Reviewed - No data to display  EKG   Radiology No results found.  Procedures Procedures (including critical care time)  Medications Ordered in UC Medications - No data to display  Initial Impression / Assessment and Plan / UC Course  I have reviewed the triage vital signs and the nursing notes.  Pertinent labs & imaging results that were available during my care of the patient were reviewed by me and considered in my medical decision making (see chart for details).   Paronychia of left middle finger.  No I&D indicated as the area has already opened and drained.  Treating with  doxycycline.  Wound care instructions and signs of worsening infection discussed.  Instructed patient to follow-up right away if he notes signs of worsening infection.  Education provided on paronychia.  He agrees to plan of care.   Final Clinical Impressions(s) / UC Diagnoses   Final diagnoses:  Paronychia of left middle finger     Discharge Instructions      Take the doxycycline as directed.    Keep your wound clean and dry.  Wash it gently twice a day with soap and water.  Apply an antibiotic cream twice a day.    Follow up if you see signs of infection, such as increased pain, redness, pus-like drainage, warmth, fever, chills, or other concerning symptoms.        ED Prescriptions     Medication Sig Dispense Auth. Provider   doxycycline (VIBRAMYCIN) 100 MG capsule Take 1 capsule (100 mg total) by mouth 2 (two) times daily for 7 days. 14 capsule Sharion Balloon, NP      PDMP not reviewed this encounter.   Sharion Balloon, NP 10/12/22 [phone]

## 2022-10-12 NOTE — ED Triage Notes (Signed)
Patient to Urgent Care with complaints of left middle finger pain and swelling. Area has been draining some purulent discharge. Has been applying neosporin.   Symptoms started 1 week ago.

## 2022-10-15 ENCOUNTER — Other Ambulatory Visit: Payer: Self-pay | Admitting: Family Medicine

## 2022-11-06 ENCOUNTER — Telehealth: Payer: Self-pay | Admitting: Gastroenterology

## 2022-11-06 NOTE — Telephone Encounter (Signed)
Patient is calling to schedule his Flex Sig at the Hospital .Please advise

## 2022-11-07 ENCOUNTER — Other Ambulatory Visit: Payer: Self-pay

## 2022-11-07 DIAGNOSIS — D128 Benign neoplasm of rectum: Secondary | ICD-10-CM

## 2022-11-07 NOTE — Telephone Encounter (Signed)
Flex has been scheduled for 12/17/22 at 1030 am at [facility] with GM   Left message on machine to call back

## 2022-11-07 NOTE — Telephone Encounter (Signed)
Flex scheduled, pt instructed and medications reviewed.  Patient instructions mailed to home.  Patient to call with any questions or concerns.  

## 2022-11-09 ENCOUNTER — Other Ambulatory Visit: Payer: Self-pay

## 2022-11-09 DIAGNOSIS — K3189 Other diseases of stomach and duodenum: Secondary | ICD-10-CM | POA: Insufficient documentation

## 2022-11-09 DIAGNOSIS — D132 Benign neoplasm of duodenum: Secondary | ICD-10-CM

## 2022-11-09 NOTE — Telephone Encounter (Signed)
The pt has an EGD that was added- I have called ENDO scheduling and added CPT code as well as sent precert the add on. The pt has been advised

## 2022-11-09 NOTE — Telephone Encounter (Signed)
Inbound call from patient, requesting to speak to [facility] in regards to procedure scheduled. Stated he was told a he would also be scheduled for an EGD. Would like clarification. Patient states he can get a receive a call on Monday morning, if call cannot be returned before 1:30 pm. As he cannot answer the phone while at work.

## 2022-11-19 ENCOUNTER — Ambulatory Visit: Payer: BC Managed Care – PPO | Admitting: Internal Medicine

## 2022-11-28 ENCOUNTER — Other Ambulatory Visit: Payer: Self-pay | Admitting: Family Medicine

## 2022-12-05 ENCOUNTER — Other Ambulatory Visit (HOSPITAL_COMMUNITY): Payer: Self-pay

## 2022-12-05 MED ORDER — TIRZEPATIDE 5 MG/0.5ML ~~LOC~~ SOAJ
5.0000 mg | SUBCUTANEOUS | 3 refills | Status: DC
  Filled 2022-12-05 – 2022-12-06 (×2): qty 2, 28d supply, fill #0

## 2022-12-06 ENCOUNTER — Other Ambulatory Visit: Payer: Self-pay

## 2022-12-10 ENCOUNTER — Encounter (HOSPITAL_COMMUNITY): Payer: Self-pay | Admitting: Gastroenterology

## 2022-12-10 NOTE — Progress Notes (Signed)
Attempted to obtain medical history via telephone, unable to reach at this time. HIPAA compliant voicemail message left requesting return call to pre surgical testing department. 

## 2022-12-17 ENCOUNTER — Ambulatory Visit (HOSPITAL_COMMUNITY)
Admission: RE | Admit: 2022-12-17 | Discharge: 2022-12-17 | Disposition: A | Discharge status: Home / Self Care | Payer: BC Managed Care – PPO | Attending: Gastroenterology | Admitting: Gastroenterology

## 2022-12-17 ENCOUNTER — Encounter (HOSPITAL_COMMUNITY)
Admission: RE | Disposition: A | Discharge status: Home / Self Care | Payer: Self-pay | Source: Home / Self Care | Attending: Gastroenterology

## 2022-12-17 ENCOUNTER — Other Ambulatory Visit: Payer: Self-pay

## 2022-12-17 ENCOUNTER — Ambulatory Visit (HOSPITAL_COMMUNITY): Payer: BC Managed Care – PPO | Admitting: Certified Registered Nurse Anesthetist

## 2022-12-17 ENCOUNTER — Encounter (HOSPITAL_COMMUNITY): Payer: Self-pay | Admitting: Gastroenterology

## 2022-12-17 DIAGNOSIS — Z1509 Genetic susceptibility to other malignant neoplasm: Secondary | ICD-10-CM | POA: Diagnosis not present

## 2022-12-17 DIAGNOSIS — Z9049 Acquired absence of other specified parts of digestive tract: Secondary | ICD-10-CM

## 2022-12-17 DIAGNOSIS — E669 Obesity, unspecified: Secondary | ICD-10-CM | POA: Insufficient documentation

## 2022-12-17 DIAGNOSIS — Z85038 Personal history of other malignant neoplasm of large intestine: Secondary | ICD-10-CM | POA: Insufficient documentation

## 2022-12-17 DIAGNOSIS — K2289 Other specified disease of esophagus: Secondary | ICD-10-CM

## 2022-12-17 DIAGNOSIS — Z79899 Other long term (current) drug therapy: Secondary | ICD-10-CM | POA: Diagnosis not present

## 2022-12-17 DIAGNOSIS — Z08 Encounter for follow-up examination after completed treatment for malignant neoplasm: Secondary | ICD-10-CM | POA: Diagnosis not present

## 2022-12-17 DIAGNOSIS — E039 Hypothyroidism, unspecified: Secondary | ICD-10-CM | POA: Diagnosis not present

## 2022-12-17 DIAGNOSIS — D128 Benign neoplasm of rectum: Secondary | ICD-10-CM

## 2022-12-17 DIAGNOSIS — Z803 Family history of malignant neoplasm of breast: Secondary | ICD-10-CM | POA: Diagnosis not present

## 2022-12-17 DIAGNOSIS — Z980 Intestinal bypass and anastomosis status: Secondary | ICD-10-CM | POA: Insufficient documentation

## 2022-12-17 DIAGNOSIS — E119 Type 2 diabetes mellitus without complications: Secondary | ICD-10-CM | POA: Insufficient documentation

## 2022-12-17 DIAGNOSIS — K624 Stenosis of anus and rectum: Secondary | ICD-10-CM | POA: Insufficient documentation

## 2022-12-17 DIAGNOSIS — Z7984 Long term (current) use of oral hypoglycemic drugs: Secondary | ICD-10-CM | POA: Insufficient documentation

## 2022-12-17 DIAGNOSIS — Z83719 Family history of colon polyps, unspecified: Secondary | ICD-10-CM | POA: Insufficient documentation

## 2022-12-17 DIAGNOSIS — K649 Unspecified hemorrhoids: Secondary | ICD-10-CM | POA: Insufficient documentation

## 2022-12-17 DIAGNOSIS — E785 Hyperlipidemia, unspecified: Secondary | ICD-10-CM | POA: Insufficient documentation

## 2022-12-17 DIAGNOSIS — K209 Esophagitis, unspecified without bleeding: Secondary | ICD-10-CM | POA: Diagnosis not present

## 2022-12-17 DIAGNOSIS — D126 Benign neoplasm of colon, unspecified: Secondary | ICD-10-CM | POA: Diagnosis not present

## 2022-12-17 DIAGNOSIS — K3189 Other diseases of stomach and duodenum: Secondary | ICD-10-CM | POA: Diagnosis not present

## 2022-12-17 DIAGNOSIS — Z6830 Body mass index (BMI) 30.0-30.9, adult: Secondary | ICD-10-CM | POA: Diagnosis not present

## 2022-12-17 DIAGNOSIS — I10 Essential (primary) hypertension: Secondary | ICD-10-CM | POA: Diagnosis not present

## 2022-12-17 DIAGNOSIS — K317 Polyp of stomach and duodenum: Secondary | ICD-10-CM | POA: Diagnosis not present

## 2022-12-17 DIAGNOSIS — Z800 Family history of malignant neoplasm of digestive organs: Secondary | ICD-10-CM | POA: Insufficient documentation

## 2022-12-17 DIAGNOSIS — K449 Diaphragmatic hernia without obstruction or gangrene: Secondary | ICD-10-CM | POA: Insufficient documentation

## 2022-12-17 DIAGNOSIS — Z87891 Personal history of nicotine dependence: Secondary | ICD-10-CM | POA: Diagnosis not present

## 2022-12-17 DIAGNOSIS — Z09 Encounter for follow-up examination after completed treatment for conditions other than malignant neoplasm: Secondary | ICD-10-CM | POA: Diagnosis present

## 2022-12-17 DIAGNOSIS — Z808 Family history of malignant neoplasm of other organs or systems: Secondary | ICD-10-CM | POA: Diagnosis not present

## 2022-12-17 DIAGNOSIS — D1339 Benign neoplasm of other parts of small intestine: Secondary | ICD-10-CM

## 2022-12-17 DIAGNOSIS — K2282 Esophagogastric junction polyp: Secondary | ICD-10-CM | POA: Insufficient documentation

## 2022-12-17 HISTORY — PX: FLEXIBLE SIGMOIDOSCOPY: SHX5431

## 2022-12-17 HISTORY — PX: BIOPSY: SHX5522

## 2022-12-17 HISTORY — PX: POLYPECTOMY: SHX5525

## 2022-12-17 HISTORY — PX: ESOPHAGOGASTRODUODENOSCOPY (EGD) WITH PROPOFOL: SHX5813

## 2022-12-17 LAB — GLUCOSE, CAPILLARY
Glucose-Capillary: 247 mg/dL — ABNORMAL HIGH (ref 70–99)
Glucose-Capillary: 220 mg/dL — ABNORMAL HIGH (ref 70–99)

## 2022-12-17 SURGERY — SIGMOIDOSCOPY, FLEXIBLE
Anesthesia: Monitor Anesthesia Care

## 2022-12-17 MED ORDER — PROPOFOL 500 MG/50ML IV EMUL
INTRAVENOUS | Status: AC
  Filled 2022-12-17: qty 50

## 2022-12-17 MED ORDER — OMEPRAZOLE 40 MG PO CPDR: 40.0000 mg | DELAYED_RELEASE_CAPSULE | Freq: Two times a day (BID) | ORAL | 6 refills | Status: DC

## 2022-12-17 MED ORDER — CIPROFLOXACIN IN D5W 400 MG/200ML IV SOLN
INTRAVENOUS | Status: DC | PRN
  Administered 2022-12-17: 400 mg via INTRAVENOUS

## 2022-12-17 MED ORDER — SODIUM CHLORIDE 0.9 % IV SOLN: INTRAVENOUS | Status: DC

## 2022-12-17 MED ORDER — LIDOCAINE 2% (20 MG/ML) 5 ML SYRINGE
INTRAMUSCULAR | Status: DC | PRN
  Administered 2022-12-17: 100 mg via INTRAVENOUS

## 2022-12-17 MED ORDER — PROPOFOL 1000 MG/100ML IV EMUL
INTRAVENOUS | Status: AC
  Filled 2022-12-17: qty 100

## 2022-12-17 MED ORDER — CIPROFLOXACIN IN D5W 400 MG/200ML IV SOLN
INTRAVENOUS | Status: AC
  Filled 2022-12-17: qty 200

## 2022-12-17 MED ORDER — LACTATED RINGERS IV SOLN
INTRAVENOUS | Status: AC | PRN
  Administered 2022-12-17: 1000 mL via INTRAVENOUS

## 2022-12-17 MED ORDER — ONDANSETRON HCL 4 MG/2ML IJ SOLN
INTRAMUSCULAR | Status: DC | PRN
  Administered 2022-12-17: 4 mg via INTRAVENOUS

## 2022-12-17 MED ORDER — PROPOFOL 10 MG/ML IV BOLUS
INTRAVENOUS | Status: DC | PRN
  Administered 2022-12-17: 150 ug/kg/min via INTRAVENOUS
  Administered 2022-12-17: 40 mg via INTRAVENOUS
  Administered 2022-12-17: 20 mg via INTRAVENOUS

## 2022-12-17 MED ORDER — INSULIN ASPART 100 UNIT/ML IJ SOLN
0.0000 [IU] | INTRAMUSCULAR | Status: DC
  Administered 2022-12-17: 3 [IU] via SUBCUTANEOUS

## 2022-12-17 MED ORDER — TIRZEPATIDE 2.5 MG/0.5ML ~~LOC~~ SOAJ: 2.5000 mg | SUBCUTANEOUS | 5 refills | Status: DC

## 2022-12-17 MED ORDER — INSULIN ASPART 100 UNIT/ML IJ SOLN
INTRAMUSCULAR | Status: AC
  Filled 2022-12-17: qty 1

## 2022-12-17 SURGICAL SUPPLY — 15 items

## 2022-12-17 NOTE — Anesthesia Preprocedure Evaluation (Addendum)
Anesthesia Evaluation  Patient identified by MRN, date of birth, ID band Patient awake    Reviewed: Allergy & Precautions, NPO status , Patient's Chart, lab work & pertinent test results  Airway Mallampati: II  TM Distance: >3 FB Neck ROM: Full    Dental no notable dental hx.    Pulmonary former smoker   Pulmonary exam normal        Cardiovascular hypertension, Pt. on home beta blockers Normal cardiovascular exam     Neuro/Psych negative neurological ROS  negative psych ROS   GI/Hepatic negative GI ROS, Neg liver ROS,,,  Endo/Other  diabetes, Oral Hypoglycemic AgentsHypothyroidism    Renal/GU negative Renal ROS     Musculoskeletal  (+) Arthritis ,    Abdominal  (+) + obese  Peds  Hematology negative hematology ROS (+)   Anesthesia Other Findings rectal polyps  STOMACH NODULE    Reproductive/Obstetrics                             Anesthesia Physical Anesthesia Plan  ASA: 3  Anesthesia Plan: MAC   Post-op Pain Management:    Induction: Intravenous  PONV Risk Score and Plan: 1 and Propofol infusion and Treatment may vary due to age or medical condition  Airway Management Planned: Nasal Cannula  Additional Equipment:   Intra-op Plan:   Post-operative Plan:   Informed Consent: I have reviewed the patients History and Physical, chart, labs and discussed the procedure including the risks, benefits and alternatives for the proposed anesthesia with the patient or authorized representative who has indicated his/her understanding and acceptance.     Dental advisory given  Plan Discussed with: CRNA  Anesthesia Plan Comments:         Anesthesia Quick Evaluation

## 2022-12-17 NOTE — H&P (Addendum)
GASTROENTEROLOGY PROCEDURE H&P NOTE   Primary Care Physician: [name], MD  HPI: Cory Harper is a 67 y.o. male who presents for EGD/Pouchoscopy for follow up/surveillance of FAP.  Past Medical History:  Diagnosis Date   Anemia    Arthritis    Cancer (HCC) 2013   stg II colon cancer s/p resection   Colonic mass    Diabetes mellitus    FAP (familial adenomatous polyposis)    Heart murmur    Hyperlipidemia    Hypertension    not on blood pressure meds since 11/13    Hypothyroidism    Nasal congestion    Past Surgical History:  Procedure Laterality Date   BIOPSY  01/15/2018   Procedure: BIOPSY;  Surgeon: [name], MD;  Location: [facility];  Service: Endoscopy;;   BIOPSY  07/10/2019   Procedure: BIOPSY;  Surgeon: [name], MD;  Location: [facility];  Service: General;;   BIOPSY  01/27/2021   Procedure: BIOPSY;  Surgeon: [name], MD;  Location: [facility];  Service: Endoscopy;;   BIOPSY  05/10/2022   Procedure: BIOPSY;  Surgeon: Lemar Lofty., MD;  Location: [facility];  Service: Gastroenterology;;   BIOPSY  08/02/2022   Procedure: BIOPSY;  Surgeon: Lemar Lofty., MD;  Location: [facility];  Service: Gastroenterology;;   BOWEL RESECTION  05/20/2012   COLON RESECTION  05/20/2012   Procedure: COLON RESECTION LAPAROSCOPIC;  Surgeon: Ardeth Sportsman, MD;  Location: [facility];  Service: General;  Laterality: N/A;  Laparoscopic Proctocolectomy, Ileal Pouch Anal Anastomoisis, Loop Ileostomy   ENDOSCOPIC MUCOSAL RESECTION  08/02/2022   Procedure: ENDOSCOPIC MUCOSAL RESECTION;  Surgeon: Lemar Lofty., MD;  [facility]: [facility];  Service: Gastroenterology;;   ESOPHAGOGASTRODUODENOSCOPY (EGD) WITH PROPOFOL N/A 04/23/2013   Procedure: ESOPHAGOGASTRODUODENOSCOPY (EGD) WITH PROPOFOL;  Surgeon: Rachael Fee, MD;  Location: [facility];  Service: Endoscopy;  Laterality: N/A;  side viewing scope   FLEXIBLE SIGMOIDOSCOPY   08/01/2012   Procedure: FLEXIBLE SIGMOIDOSCOPY;  Surgeon: [name], MD;  [facility]: [facility];  Service: Endoscopy;  Laterality: N/A;  use endoscope   FLEXIBLE SIGMOIDOSCOPY N/A 11/29/2014   Procedure: FLEXIBLE SIGMOIDOSCOPY/POUCHOSCOPY;  Surgeon: [name], MD;  [facility];  Service: Endoscopy;  Laterality: N/A;   FLEXIBLE SIGMOIDOSCOPY N/A 05/10/2022   Procedure: FLEXIBLE SIGMOIDOSCOPY;  Surgeon: Meridee Score Netty Starring., MD;  Location: [facility];  Service: Gastroenterology;  Laterality: N/A;   FOREIGN BODY REMOVAL  08/02/2022   Procedure: FOREIGN BODY REMOVAL;  Surgeon: Meridee Score Netty Starring., MD;  Location: [facility];  Service: Gastroenterology;;   ILEOSTOMY  05/20/12   ILEOSTOMY CLOSURE N/A 09/18/2012   Procedure: Loop Ileostomy Takedown with EUA ;  Surgeon: Ardeth Sportsman, MD;  Location: [facility];  Service: General;  Laterality: N/A;  loop ileostomy takedown with EUA    KNEE ARTHROSCOPY  1998   Right knee   POLYPECTOMY  05/10/2022   Procedure: POLYPECTOMY;  Surgeon: Lemar Lofty., MD;  Location: [facility];  Service: Gastroenterology;;   POLYPECTOMY  08/02/2022   Procedure: POLYPECTOMY;  Surgeon: Lemar Lofty., MD;  Location: [facility];  Service: Gastroenterology;;   POUCHOSCOPY  11/16/2013   Procedure: POUCHOSCOPY;  Surgeon: [name], MD;  [facility]: [facility];  Service: Endoscopy;;   POUCHOSCOPY N/A 11/16/2015   Procedure: POUCHOSCOPY;  Surgeon: [name], MD;  [facility]: [facility];  Service: Endoscopy;  Laterality: N/A;   POUCHOSCOPY N/A 12/20/2016   Procedure: POUCHOSCOPY;  Surgeon: [name], MD;  [facility]: [facility];  Service: Endoscopy;  Laterality: N/A;   POUCHOSCOPY N/A 01/15/2018   Procedure: POUCHOSCOPY;  Surgeon: [name], MD;  [facility]: [facility];  Service: Endoscopy;  Laterality: N/A;   POUCHOSCOPY N/A 07/10/2019   Procedure: POUCHOSCOPY;  Surgeon: [name], MD;  Location: [facility];   Service: General;  Laterality: N/A;   POUCHOSCOPY N/A 01/27/2021   Procedure: POUCHOSCOPY;  Surgeon: [name], MD;  [facility]: [facility];  Service: Endoscopy;  Laterality: N/A;   POUCHOSCOPY N/A 08/02/2022   Procedure: POUCHOSCOPY;  Surgeon: Mansouraty, Netty Starring., MD;  Location: [facility];  Service: Gastroenterology;  Laterality: N/A;   THYROID LOBECTOMY  05/20/2012   Procedure: THYROID LOBECTOMY;  Surgeon: Ardeth Sportsman, MD;  Location: [facility];  Service: General;  Laterality: Left;  LEFT THYROID LOBECTOMY   UPPER GASTROINTESTINAL ENDOSCOPY     WISDOM TOOTH EXTRACTION     Current Facility-Administered Medications  Medication Dose Route Frequency Provider Last Rate Last Admin   0.9 %  sodium chloride infusion   Intravenous Continuous Mansouraty, Netty Starring., MD       insulin aspart (novoLOG) injection 0-9 Units  0-9 Units Subcutaneous Q4H Ellender, Catheryn Bacon, MD   3 Units at 12/17/22 1610   lactated ringers infusion    Continuous PRN Mansouraty, Netty Starring., MD 20 mL/hr at 12/17/22 1003 Continued from Pre-op at 12/17/22 1003    Current Facility-Administered Medications:    0.9 %  sodium chloride infusion, , Intravenous, Continuous, Mansouraty, Netty Starring., MD   insulin aspart (novoLOG) injection 0-9 Units, 0-9 Units, Subcutaneous, Q4H, Ellender, Catheryn Bacon, MD, 3 Units at 12/17/22 0943   lactated ringers infusion, , , Continuous PRN, Mansouraty, Netty Starring., MD, Last Rate: 20 mL/hr at 12/17/22 1003, Continued from Pre-op at 12/17/22 1003 Allergies  Allergen Reactions   Ibuprofen Swelling    Made his joints swell with high doses   Family History  Problem Relation Age of Onset   Uterine cancer Mother    Hypertension Mother    Stroke Mother    Colon cancer Mother    Colon polyps Mother    Cancer Mother        colon, endometrial   Diabetes Father    Obesity Father    Pneumonia Father    Cancer Father        skin - squamous    Breast cancer Sister    Diabetes Paternal  Grandmother    Prostate cancer Neg Hx    Esophageal cancer Neg Hx    Social History   Socioeconomic History   Marital status: Married    Spouse name: June   Number of children: 0   Years of education: Not on file   Highest education level: Not on file  Occupational History   Occupation: bus Air traffic controller: [employer]    Comment: 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, 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  Tobacco Use   Smoking status: Former    Types: Cigarettes    Quit date: 08/07/1991    Years since quitting: 31.3   Smokeless tobacco: Never   Tobacco comments:    quit in 1993  Vaping Use   Vaping Use: Never used  Substance and Sexual Activity   Alcohol use: No   Drug use: No   Sexual activity: Not on file  Other Topics Concern   Not on file  Social History Narrative   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 GEN: NAD EYE: Sclerae anicteric ENT: MMM CV: Non-tachycardic GI: Soft, NT/ND NEURO:  Alert & Oriented x 3  Lab Results: No results for input(s): "WBC", "HGB", "HCT", "PLT" in the last 72 hours. BMET No results for input(s): "NA", "K", "CL", "CO2", "GLUCOSE", "BUN", "CREATININE", "CALCIUM" in the last 72 hours. LFT No results for input(s): "PROT", "ALBUMIN", "AST", "ALT", "ALKPHOS", "BILITOT", "BILIDIR", "IBILI" in the last 72 hours. PT/INR No results for input(s): "LABPROT", "INR" in the last 72  hours.   Impression / Plan: This is a 67 y.o.male who presents for EGD/Pouchoscopy for follow up/surveillance of FAP.  The risks and benefits of endoscopic evaluation/treatment were discussed with the patient and/or family; these include but are not limited to the risk of perforation, infection, bleeding, missed lesions, lack of diagnosis, severe illness requiring hospitalization, as well as anesthesia and sedation related illnesses.  The patient's history has been reviewed, patient examined, no change in status, and deemed stable for procedure.  The patient and/or family is agreeable to proceed.    Corliss Parish, MD [facility] Office # [phone]

## 2022-12-17 NOTE — Discharge Instructions (Signed)

## 2022-12-17 NOTE — Transfer of Care (Signed)
Immediate Anesthesia Transfer of Care Note  Patient: Cory Harper  Procedure(s) Performed: FLEXIBLE SIGMOIDOSCOPY ESOPHAGOGASTRODUODENOSCOPY (EGD) WITH PROPOFOL BIOPSY POLYPECTOMY  Patient Location: Endoscopy Unit  Anesthesia Type:MAC  Level of Consciousness: awake, alert , and oriented  Airway & Oxygen Therapy: Patient Spontanous Breathing and Patient connected to face mask oxygen  Post-op Assessment: Report given to RN and Post -op Vital signs reviewed and stable  Post vital signs: Reviewed and stable  Last Vitals:  Vitals Value Taken Time  BP    Temp    Pulse 87 12/17/22 1257  Resp 16 12/17/22 1257  SpO2 100 % 12/17/22 1257  Vitals shown include unvalidated device data.  Last Pain:  Vitals:   12/17/22 0915  TempSrc: Oral  PainSc: 0-No pain         Complications: No notable events documented.

## 2022-12-17 NOTE — Op Note (Signed)
[facility] Patient Name: Cory Harper Procedure Date: 12/17/2022 MRN: [id number] Attending MD: Corliss Parish , MD, [id number] Date of Birth: [birth year] CSN: [id number] Age: 67 Admit Type: Outpatient Procedure:                Pouchoscopy Indications:              History of total colectomy Providers:                Corliss Parish, MD, Zoe Lan, RN, Marja Kays, Technician Referring MD:             [name], MD, Leighton Roach. Cheri Fowler                            Para March, MD Medicines:                Monitored Anesthesia Care, Cipro 400 mg IV (1 week                            after patient's last pouchoscopy in polyp removal                            he required antibiotics for a perineal abscess thus                            we gave antibiotics today) Complications:            No immediate complications. Estimated Blood Loss:     Estimated blood loss was minimal. Procedure:                Pre-Anesthesia Assessment:                           - Prior to the procedure, a History and Physical                            was performed, and patient medications and                            allergies were reviewed. The patient's tolerance of                            previous anesthesia was also reviewed. The risks                            and benefits of the procedure and the sedation                            options and risks were discussed with the patient.                            All questions were answered, and informed consent  was obtained. Prior Anticoagulants: The patient has                            taken no anticoagulant or antiplatelet agents                            except for NSAID medication. ASA Grade Assessment:                            III - A patient with severe systemic disease. After                            reviewing the risks and benefits, the patient was                             deemed in satisfactory condition to undergo the                            procedure.                           After obtaining informed consent, the endoscope was                            passed under direct vision. Throughout the                            procedure, the patient's blood pressure, pulse, and                            oxygen saturations were monitored continuously. The                            [id number]-[id number] ([id number]) Olympus therapeutic endoscope                            was introduced through the anus and advanced to the                            the neo-terminal ileum. After obtaining informed                            consent, the endoscope was passed under direct                            vision. Throughout the procedure, the patient's                            blood pressure, pulse, and oxygen saturations were                            monitored continuously.The procedure was performed  without difficulty. The patient tolerated the                            procedure. The quality of the bowel preparation was                            adequate. 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      The digital rectal exam findings include hemorrhoids and anal stenosis.       Pertinent negatives include no palpable rectal lesions.      The neo-terminal ileum appeared normal.      The rectal pouch contained > than 70 sessile, non-bleeding polyps. The       polyps were 1 to 8 mm in diameter. 53 of the polyps were removed with a       cold snare. Resection and retrieval were complete.      The anus contained a benign-appearing, intrinsic mild stenosis measuring       1 cm (in length) x 1.2 cm (inner diameter) that was traversed. Digital       dilation of the anal stenosis  was performed Impression:               - Hemorrhoids and rectal stricture found on digital                            rectal exam.                           - The examined portion of the neoterminal ileum was                            normal.                           - > than 70 ileal polyps, removed with a cold                            snare. Resected and retrieved.                           - Stricture at the anus traversed with therapeutic                            endoscope and digital dilation performed as well. Recommendation:           - The patient will be observed post-procedure,                            until all discharge criteria are met.                           - Discharge patient to home.                           - Patient has a  contact number available for                            emergencies. The signs and symptoms of potential                            delayed complications were discussed with the                            patient. Return to normal activities tomorrow.                            Written discharge instructions were provided to the                            patient.                           - Resume previous diet.                           - Await pathology results.                           - Repeat post-surgical lower GI endoscopy in 6                            months for surveillance. I am hopeful that the                            previous large polyp resection will decrease our                            need of pouch reversal/end ileostomy if we are able                            to continue to do more frequent surveillance. We                            have significantly decreased his polyp burden over                            the course of the last year and I think it is safe                            for [place] to continue to monitor unless something else                            develops.                           - The  findings and recommendations were discussed                            with the  patient.                           - The findings and recommendations were discussed                            with the patient's family. Procedure Code(s):        --- Professional ---                           [phone], Endoscopic evaluation of small intestinal                            pouch (eg, Kock pouch, ileal reservoir [S or J]);                            diagnostic, including collection of specimen(s) by                            brushing or washing, when performed (separate                            procedure)                           44799, Unlisted procedure, small intestine Diagnosis Code(s):        --- Professional ---                           K64.9, Unspecified hemorrhoids                           D13.39, Benign neoplasm of other parts of small                            intestine                           K62.4, Stenosis of anus and rectum                           Z90.49, Acquired absence of other specified parts                            of digestive tract CPT copyright 2022 American Medical Association. All rights reserved. The codes documented in this report are preliminary and upon coder review may  be revised to meet current compliance requirements. Corliss Parish, MD 12/17/2022 1:12:33 PM Number of Addenda: 0

## 2022-12-17 NOTE — Op Note (Signed)
[facility] Patient Name: Cory Harper Procedure Date: 12/17/2022 MRN: [id number] Attending MD: Corliss Parish , MD, [id number] Date of Birth: 1956/06/02 CSN: [id number] Age: 67 Admit Type: Outpatient Procedure:                Upper GI endoscopy Indications:              Familial Adenomatous Polyposis, Follow-up of EGJ                            nodule Providers:                Corliss Parish, MD, Zoe Lan, RN, Marja Kays, Technician Referring MD:             [name], MD Medicines:                Monitored Anesthesia Care Complications:            No immediate complications. Estimated Blood Loss:     Estimated blood loss was minimal. Procedure:                Pre-Anesthesia Assessment:                           - Prior to the procedure, a History and Physical                            was performed, and patient medications and                            allergies were reviewed. The patient's tolerance of                            previous anesthesia was also reviewed. The risks                            and benefits of the procedure and the sedation                            options and risks were discussed with the patient.                            All questions were answered, and informed consent                            was obtained. Prior Anticoagulants: The patient has                            taken no anticoagulant or antiplatelet agents                            except for NSAID medication. ASA Grade Assessment:                            III -  A patient with severe systemic disease. After                            reviewing the risks and benefits, the patient was                            deemed in satisfactory condition to undergo the                            procedure.                           After obtaining informed consent, the endoscope was                            passed under direct  vision. Throughout the                            procedure, the patient's blood pressure, pulse, and                            oxygen saturations were monitored continuously. The                            [id number]-[id number] ([id number]) Olympus therapeutic endoscope                            was introduced through the mouth, and advanced to                            the second part of duodenum. The [id number]                            ([id number]) Olympus duodenoscope was introduced                            through the mouth, and advanced to the area of                            papilla. The upper GI endoscopy was accomplished                            without difficulty. The patient tolerated the                            procedure. Scope In: Scope Out: Findings:      No gross lesions were noted in the proximal esophagus and in the mid       esophagus.      LA Grade B (one or more mucosal breaks greater than 5 mm, not extending       between the tops of two mucosal folds) esophagitis with no bleeding was       found in the distal esophagus.      The Z-line was irregular and was found 40 cm from the incisors.  A single 13 mm nodule was found at the gastroesophageal junction. The       nodule was Paris classification Ip (protruding, pedunculated). This was       previously biopsied and returned as inflammatory. Biopsies were taken       with a cold forceps for histology.      A 2 cm hiatal hernia was present.      Striped mildly erythematous mucosa without bleeding was found in the       entire examined stomach. Biopsies were taken with a cold forceps for       histology and Helicobacter pylori testing.      A medium post mucosectomy scar was found just distal to the D1/D2 angle       of the duodenum. There was what appeared to be polypoid tissue       inferiorly. The this area was removed with a cold snare due to concern       of adenomatous potential. Resection and retrieval were  complete.      The major papilla was otherwise normal.      No other gross lesions were noted in the duodenal bulb, in the first       portion of the duodenum and in the second portion of the duodenum. Impression:               - No gross lesions in the proximal esophagus and in                            the mid esophagus.                           - LA Grade B esophagitis with no bleeding distally.                           - Z-line irregular, 40 cm from the incisors.                           - Nodule found at the GE junction just below the                            esophagitis. Biopsied.                           - 2 cm hiatal hernia.                           - Erythematous mucosa in the stomach. Biopsied.                           - Duodenal scar just past D1/D2 angle. Appearance                            of adenomatous tissue resected.                           - Normal major papilla.                           -  No other gross lesions in the duodenal bulb, in                            the first portion of the duodenum and in the second                            portion of the duodenum. Moderate Sedation:      Not Applicable - Patient had care per Anesthesia. Recommendation:           - Proceed to scheduled pouchoscopy.                           - Observe patient's clinical course.                           - Initiate omeprazole 40 mg twice daily. Maintain                            this for 2 to 3 months then may decrease to once                            daily.                           - Await pathology results.                           - If the inflammatory nodule persists we will need                            to consider resection or if it is adenomatous then                            we will need to remove via EMR Band assist.                           - Will plan a 74-month follow-up EGD for follow-up                            of esophagitis (will be able to be  done                            concurrently with pouchoscopy).                           - The findings and recommendations were discussed                            with the patient.                           - The findings and recommendations were discussed  with the patient's family. Procedure Code(s):        --- Professional ---                           [phone], Esophagogastroduodenoscopy, flexible,                            transoral; with removal of tumor(s), polyp(s), or                            other lesion(s) by snare technique                           43239, 59, Esophagogastroduodenoscopy, flexible,                            transoral; with biopsy, single or multiple Diagnosis Code(s):        --- Professional ---                           K22.89, Other specified disease of esophagus                           K20.90, Esophagitis, unspecified without bleeding                           K44.9, Diaphragmatic hernia without obstruction or                            gangrene                           K31.89, Other diseases of stomach and duodenum                           D12.6, Benign neoplasm of colon, unspecified                           Z15.09, Genetic susceptibility to other malignant                            neoplasm                           K31.7, Polyp of stomach and duodenum CPT copyright 2022 American Medical Association. All rights reserved. The codes documented in this report are preliminary and upon coder review may  be revised to meet current compliance requirements. Corliss Parish, MD 12/17/2022 1:04:38 PM Number of Addenda: 0

## 2022-12-18 LAB — SURGICAL PATHOLOGY

## 2022-12-18 NOTE — Anesthesia Postprocedure Evaluation (Signed)
Anesthesia Post Note  Patient: Cory Harper  Procedure(s) Performed: FLEXIBLE SIGMOIDOSCOPY ESOPHAGOGASTRODUODENOSCOPY (EGD) WITH PROPOFOL BIOPSY POLYPECTOMY     Patient location during evaluation: Endoscopy Anesthesia Type: MAC Level of consciousness: awake Pain management: pain level controlled Vital Signs Assessment: post-procedure vital signs reviewed and stable Respiratory status: spontaneous breathing, nonlabored ventilation and respiratory function stable Cardiovascular status: blood pressure returned to baseline and stable Postop Assessment: no apparent nausea or vomiting Anesthetic complications: no   No notable events documented.  Last Vitals:  Vitals:   12/17/22 1310 12/17/22 1320  BP: (!) 118/57 (!) 136/58  Pulse: 88   Resp: 15   Temp:    SpO2: 97%     Last Pain:  Vitals:   12/17/22 1320  TempSrc:   PainSc: 0-No pain                 Shanea Karney P Shaquel Chavous

## 2022-12-20 ENCOUNTER — Encounter (HOSPITAL_COMMUNITY): Payer: Self-pay | Admitting: Gastroenterology

## 2022-12-23 ENCOUNTER — Encounter: Payer: Self-pay | Admitting: Gastroenterology

## 2022-12-24 ENCOUNTER — Encounter: Payer: Self-pay | Admitting: Internal Medicine

## 2022-12-24 ENCOUNTER — Ambulatory Visit: Payer: BC Managed Care – PPO | Admitting: Internal Medicine

## 2022-12-24 ENCOUNTER — Other Ambulatory Visit: Payer: Self-pay

## 2022-12-24 VITALS — BP 128/76 | HR 78 | Ht 71.0 in | Wt 217.6 lb

## 2022-12-24 DIAGNOSIS — D34 Benign neoplasm of thyroid gland: Secondary | ICD-10-CM

## 2022-12-24 DIAGNOSIS — E1122 Type 2 diabetes mellitus with diabetic chronic kidney disease: Secondary | ICD-10-CM

## 2022-12-24 DIAGNOSIS — Z7984 Long term (current) use of oral hypoglycemic drugs: Secondary | ICD-10-CM

## 2022-12-24 DIAGNOSIS — N182 Chronic kidney disease, stage 2 (mild): Secondary | ICD-10-CM

## 2022-12-24 DIAGNOSIS — Z7985 Long-term (current) use of injectable non-insulin antidiabetic drugs: Secondary | ICD-10-CM | POA: Diagnosis not present

## 2022-12-24 DIAGNOSIS — E669 Obesity, unspecified: Secondary | ICD-10-CM

## 2022-12-24 DIAGNOSIS — E119 Type 2 diabetes mellitus without complications: Secondary | ICD-10-CM

## 2022-12-24 DIAGNOSIS — E890 Postprocedural hypothyroidism: Secondary | ICD-10-CM

## 2022-12-24 LAB — POCT GLYCOSYLATED HEMOGLOBIN (HGB A1C): Hemoglobin A1C: 11.1 % — AB (ref 4.0–5.6)

## 2022-12-24 MED ORDER — TIRZEPATIDE 5 MG/0.5ML ~~LOC~~ SOAJ
5.0000 mg | SUBCUTANEOUS | 3 refills | Status: DC
  Filled 2022-12-24: qty 2, 28d supply, fill #0

## 2022-12-24 NOTE — Progress Notes (Signed)
Patient ID: Cory Harper, male   DOB: 1955/12/15, 67 y.o.   MRN: [id number]   HPI: Cory Harper is a 67 y.o.-year-old male, returning for follow-up for DM2, dx in 1998, non-insulin-dependent, uncontrolled, with long term complications (+ mild CKD).  Last visit 5 months ago  Interim history: No increased urination, blurry vision, nausea, chest pain. 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  He is still very busy and still not exercising.  Also, he continues to work long hours >> dinner is late. Since last visit, he had cellulitis and abscess on his buttock, and also paronychia, then abdominal inf.  He was treated with ABx for these. He was out of Mounjaro 5 mg for 2 weeks.  Last week he was able to start the 2.5 mg dose as the higher dose was not in stock.  Reviewed his HbA1c levels: 07/10/2022: HbA1c calculated from fructosamine was 9.2%, higher Lab Results  Component Value Date   HGBA1C 11.1 (H) 01/05/2022   HGBA1C 9.9 (A) 04/11/2021   HGBA1C 10.5 (A) 12/06/2020  09/01/2021: HbA1c calculated from fructosamine is 8.36%, slightly higher than before.   06/13/2021: HbA1c calculated from fructosamine is 7.9% 12/06/2020: HbA1c calculated from fructosamine is 7.8%, higher than before, but lower than the directly measured HbA1c. 04/01/2020: HbA1c calculated from fructosamine is 7.4%, correlating better with your sugars at home. 11/18/2019: HbA1c calculated from fructosamine is 7.5%. 06/17/2019: HbA1c calculated from fructosamine is 7.0%! 05/22/2018: HbA1c calculated from the fructosamine is much better than the measured one, at 7.6%.  He is on: - Metformin 1000 mg 2x a day with meals >> 2000 mg with dinner - Amaryl 4 mg in am and 2 mg in pm - Jardiance 10 mg daily at night to avoid daytime urinary frequency-started 03/2017 - Trulicity 4.5 mg weekly >> Mounjaro 5 mg weekly >> off now Tried Actos >> signif. Weight gain On Cinnamon. He was on Januvia, Ozempic.  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  No plans for retirement yet.  Pt checks his sugars 1-2 times a day -he declined a CGM. - am:  80, 93-137, 147 >> 102-155, 160  >> 135, 160-166 >> 125, 156-180 - 2h after b'fast: 108, 143, 180 >> 158 >> 122-155 >> n/c >> 158-220 - before lunch: 126-150, 155>> 130-155, 160 >> 161-165 >> 172-180 - 2h after lunch: n/c >> 151-160 >> 152-157 >> 155 >> 161 >> 180, 183 - before dinner:  123 >> n/c >> 151, 160-164 >> n/c  - 2h after dinner: 155-160 >> 138-160 >> 150-161 >> 160, 161 >> 180 - bedtime: 1 140-155 >> 147-160 >> 150-156 >> n/c>> 158, 166 >> 165 - nighttime: n/c >> 180 >> n/c Lowest sugar was  80 >> 102 >> 125 ; it is unclear at which CBG level he has hypoglycemia awareness. Highest sugar was 160 >> 161>> 220.  Glucometer: One Touch Ultra mini>> One Touch Ultra  Pt's meals are: - Breakfast: bowl of grits, [place] bacon before starts work, - Lunch: [place] or ham+cheese wrap or tuna salad - Dinner: baked chicken or pork chop, green beans - Snacks: crackers, desserts  Previously using a stationary bike in the evening -not recently.  -+ Mild CKD, last BUN/creatinine:  Lab Results  Component Value Date   BUN 19 07/10/2022   BUN 20 06/13/2021   CREATININE 1.08 07/10/2022   CREATININE 1.09 06/13/2021   Lab Results  Component  Value Date   GFRNONAA 68 (L) 09/22/2012   + HL; last set of lipids: Lab Results  Component Value Date   CHOL 132 07/10/2022   HDL 45.10 07/10/2022   LDLCALC 34 06/27/2020   LDLDIRECT 54.0 07/10/2022   TRIG 236.0 (H) 07/10/2022   CHOLHDL 3 07/10/2022  On Lipitor 20. On ASA 81.  - last eye exam was 04/06/2022: No DR.  -No numbness and tingling in his feet.  Last foot exam 07/10/2022.  Pt has no FH of DM.  He has familial polyposis and has a history of colon cancer >> has a J-pouch - he had a pouchoscopy in 07/2019.  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  Previous thyroid ultrasound report from 02/2018: 1. Post left thyroid lobectomy without evidence of locally residual or locally recurrent disease. 2. Normal appearance of the remaining thyroid parenchyma.  Pt denies: - feeling nodules in neck - hoarseness - dysphagia - choking  Postsurgical hypothyroidism:  Pt is on levothyroxine 100 mcg daily, taken: - in am - fasting - at least 30 min from b'fast - no Ca, MVI, PPIs - on Fe 2x a day - am (30 min after LT4) and evening >> moved later in the day - not on Biotin  Reviewed his TFTs: Lab Results  Component Value Date   TSH 3.70 07/10/2022   TSH 3.59 12/27/2020   TSH 4.12 01/26/2020   TSH 3.33 06/17/2019   TSH 3.01 05/22/2018   TSH 3.46 07/25/2017   TSH 2.29 06/27/2016   TSH 1.65 06/14/2015   TSH 1.63 03/11/2014   TSH 2.22 01/19/2013   ROS: + see HPI + leg swelling  I reviewed pt's medications, allergies, PMH, social hx, family hx, and changes were documented in the history of present illness. Otherwise, unchanged from my initial visit note.  Past Medical History:  Diagnosis Date   Anemia    Arthritis    Cancer (HCC) 2013   stg II colon cancer s/p resection   Colonic mass    Diabetes mellitus    FAP (familial adenomatous polyposis)    Heart murmur    Hyperlipidemia    Hypertension    not on blood pressure meds since 11/13    Hypothyroidism    Nasal congestion    Past Surgical History:  Procedure Laterality Date   BIOPSY  01/15/2018   Procedure: BIOPSY;  Surgeon: [name], MD;  Location: [facility];  Service: Endoscopy;;   BIOPSY  07/10/2019   Procedure: BIOPSY;  Surgeon: [name], MD;  Location: [facility];  Service: General;;   BIOPSY  01/27/2021   Procedure: BIOPSY;  Surgeon: [name], MD;  Location: [facility];  Service: Endoscopy;;   BIOPSY  05/10/2022   Procedure: BIOPSY;  Surgeon: Lemar Lofty., MD;  Location:  [facility];  Service: Gastroenterology;;   BIOPSY  08/02/2022   Procedure: BIOPSY;  Surgeon: Lemar Lofty., MD;  Location: [facility];  Service: Gastroenterology;;   BIOPSY  12/17/2022   Procedure: BIOPSY;  Surgeon: Lemar Lofty., MD;  [facility]: [facility];  Service: Gastroenterology;;   BOWEL RESECTION  05/20/2012   COLON RESECTION  05/20/2012   Procedure: COLON RESECTION LAPAROSCOPIC;  Surgeon: Ardeth Sportsman, MD;  Location: [facility];  Service: General;  Laterality: N/A;  Laparoscopic Proctocolectomy, Ileal Pouch Anal Anastomoisis, Loop Ileostomy   ENDOSCOPIC MUCOSAL RESECTION  08/02/2022   Procedure:  ENDOSCOPIC MUCOSAL RESECTION;  Surgeon: Meridee Score Netty Starring., MD;  Location: [facility];  Service: Gastroenterology;;   ESOPHAGOGASTRODUODENOSCOPY (EGD) WITH PROPOFOL N/A 04/23/2013   Procedure: ESOPHAGOGASTRODUODENOSCOPY (EGD) WITH PROPOFOL;  Surgeon: Rachael Fee, MD;  [facility]: [facility];  Service: Endoscopy;  Laterality: N/A;  side viewing scope   ESOPHAGOGASTRODUODENOSCOPY (EGD) WITH PROPOFOL N/A 12/17/2022   Procedure: ESOPHAGOGASTRODUODENOSCOPY (EGD) WITH PROPOFOL;  Surgeon: Meridee Score Netty Starring., MD;  Location: [facility];  Service: Gastroenterology;  Laterality: N/A;   FLEXIBLE SIGMOIDOSCOPY  08/01/2012   Procedure: FLEXIBLE SIGMOIDOSCOPY;  Surgeon: [name], MD;  [facility];  Service: Endoscopy;  Laterality: N/A;  use endoscope   FLEXIBLE SIGMOIDOSCOPY N/A 11/29/2014   Procedure: FLEXIBLE SIGMOIDOSCOPY/POUCHOSCOPY;  Surgeon: [name], MD;  [facility];  Service: Endoscopy;  Laterality: N/A;   FLEXIBLE SIGMOIDOSCOPY N/A 05/10/2022   Procedure: FLEXIBLE SIGMOIDOSCOPY;  Surgeon: Meridee Score Netty Starring., MD;  Location: [facility];  Service: Gastroenterology;  Laterality: N/A;   FLEXIBLE SIGMOIDOSCOPY N/A 12/17/2022   Procedure: FLEXIBLE SIGMOIDOSCOPY;  Surgeon: Meridee Score Netty Starring., MD;  Location: [facility];   Service: Gastroenterology;  Laterality: N/A;   FOREIGN BODY REMOVAL  08/02/2022   Procedure: FOREIGN BODY REMOVAL;  Surgeon: Meridee Score Netty Starring., MD;  Location: [facility];  Service: Gastroenterology;;   ILEOSTOMY  05/20/12   ILEOSTOMY CLOSURE N/A 09/18/2012   Procedure: Loop Ileostomy Takedown with EUA ;  Surgeon: Ardeth Sportsman, MD;  Location: [facility];  Service: General;  Laterality: N/A;  loop ileostomy takedown with EUA    KNEE ARTHROSCOPY  1998   Right knee   POLYPECTOMY  05/10/2022   Procedure: POLYPECTOMY;  Surgeon: Lemar Lofty., MD;  Location: [facility];  Service: Gastroenterology;;   POLYPECTOMY  08/02/2022   Procedure: POLYPECTOMY;  Surgeon: Lemar Lofty., MD;  Location: [facility];  Service: Gastroenterology;;   POLYPECTOMY  12/17/2022   Procedure: POLYPECTOMY;  Surgeon: Lemar Lofty., MD;  Location: [facility];  Service: Gastroenterology;;   POUCHOSCOPY  11/16/2013   Procedure: POUCHOSCOPY;  Surgeon: [name], MD;  [facility];  Service: Endoscopy;;   POUCHOSCOPY N/A 11/16/2015   Procedure: POUCHOSCOPY;  Surgeon: [name], MD;  [facility]: [facility];  Service: Endoscopy;  Laterality: N/A;   POUCHOSCOPY N/A 12/20/2016   Procedure: POUCHOSCOPY;  Surgeon: [name], MD;  [facility];  Service: Endoscopy;  Laterality: N/A;   POUCHOSCOPY N/A 01/15/2018   Procedure: POUCHOSCOPY;  Surgeon: [name], MD;  [facility]: [facility];  Service: Endoscopy;  Laterality: N/A;   POUCHOSCOPY N/A 07/10/2019   Procedure: POUCHOSCOPY;  Surgeon: [name], MD;  Location: [facility];  Service: General;  Laterality: N/A;   POUCHOSCOPY N/A 01/27/2021   Procedure: POUCHOSCOPY;  Surgeon: [name], MD;  [facility];  Service: Endoscopy;  Laterality: N/A;   POUCHOSCOPY N/A 08/02/2022   Procedure: POUCHOSCOPY;  Surgeon: Mansouraty, Netty Starring., MD;  Location: [facility];  Service: Gastroenterology;   Laterality: N/A;   THYROID LOBECTOMY  05/20/2012   Procedure: THYROID LOBECTOMY;  Surgeon: Ardeth Sportsman, MD;  Location: [facility];  Service: General;  Laterality: Left;  LEFT THYROID LOBECTOMY   UPPER GASTROINTESTINAL ENDOSCOPY     WISDOM TOOTH EXTRACTION     Social History   Socioeconomic History   Marital status: Married    Spouse name: June   Number of children: 0  Social Needs  Occupational History   Occupation: bus Air traffic controller: [employer]    Comment: 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, 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  Tobacco Use   Smoking status: Former Smoker    Last attempt to quit: 08/07/1991    Years since quitting: 25.9   Smokeless tobacco: Never Used   Tobacco comment: quit in 1993  Substance and Sexual Activity   Alcohol use: No   Drug use: No   Sexual activity: Not on file  Other Topics Concern   Not on file  Social History Narrative   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 2 mL 5   acetaminophen (TYLENOL) 500 MG tablet Take 500-1,000 mg by mouth every 6 (six) hours as needed for moderate pain.     aspirin 81 MG tablet Take 1 tablet (81 mg total) by mouth daily. 30 tablet    atorvastatin (LIPITOR) 20 MG tablet TAKE 1 TABLET(20 MG) BY MOUTH DAILY (Patient taking differently: Take 20 mg by mouth at bedtime.) 90 tablet 3   Blood Glucose Monitoring Suppl (ONE TOUCH ULTRA SYSTEM KIT) W/DEVICE KIT Dispense 1 kit with 100 lancets and strips, with 3 rf on lancets and strips.  Check sugar daily.  Dx E11.9 1 each 0   cholecalciferol (VITAMIN D) 1000 UNITS tablet Take 1,000 Units by mouth in the morning.     cyanocobalamin (VITAMIN B12) 1000 MCG tablet Take 1 tablet (1,000 mcg total) by mouth once a week.     ferrous sulfate  (FEROSUL) 325 (65 FE) MG tablet Take 1 tablet (325 mg total) by mouth daily. (Patient taking differently: Take 325 mg by mouth at bedtime.)     fluticasone (FLONASE) 50 MCG/ACT nasal spray SHAKE LIQUID AND USE 2 SPRAYS IN EACH NOSTRIL DAILY 48 g 1   glimepiride (AMARYL) 2 MG tablet TAKE 2 TABLETS BY MOUTH IN THE MORNING THEN TAKE 1 TABLET BY MOUTH IN THE EVENING 270 tablet 3   JARDIANCE 10 MG TABS tablet TAKE 1 TABLET BY MOUTH DAILY (Patient taking differently: Take 10 mg by mouth at bedtime.) 90 tablet 3   levothyroxine (SYNTHROID) 100 MCG tablet TAKE 1 TABLET(100 MCG) BY MOUTH EVERY MORNING 90 tablet 3   loperamide (ANTI-DIARRHEAL) 2 MG tablet TAKE 1-2 TABLETS BY MOUTH TWICE DAILY AS NEEDED (Patient taking differently: Take 2 mg by mouth 2 (two) times daily.) 360 tablet 3   loratadine (CLARITIN) 10 MG tablet Take 10 mg by mouth in the morning.     metFORMIN (GLUCOPHAGE) 1000 MG tablet TAKE 2 TABLETS BY MOUTH DAILY (Patient taking differently: Take 2,000 mg by mouth daily with supper.) 180 tablet 3   omeprazole (PRILOSEC) 40 MG capsule Take 1 capsule (40 mg total) by mouth 2 (two) times daily before a meal. 60 capsule 6   ONETOUCH ULTRA test strip USE AS DIRECTED TO TEST BLOOD GLUCOSE DAILY 100 strip 2   tirzepatide (MOUNJARO) 5 MG/0.5ML Pen Inject 5 mg into the skin once a week. 2 mL 3   TRULICITY 4.5 MG/0.5ML SOPN INJECT 4.5MG  ONCE A WEEK AS DIRECTED (Patient not taking: Reported on 12/13/2022) 6 mL 0   No current facility-administered medications on file prior to visit.   Allergies  Allergen Reactions   Ibuprofen Swelling    Made his joints swell with high doses   Family  History  Problem Relation Age of Onset   Uterine cancer Mother    Hypertension Mother    Stroke Mother    Colon cancer Mother    Colon polyps Mother    Cancer Mother        colon, endometrial   Diabetes Father    Obesity Father    Pneumonia Father    Cancer Father        skin - squamous    Breast cancer Sister     Diabetes Paternal Grandmother    Prostate cancer Neg Hx    Esophageal cancer Neg Hx    PE: BP 128/76 (BP Location: Left Arm, Patient Position: Sitting, Cuff Size: Normal)   Pulse 78   Ht 5\' 11"  (1.803 m)   Wt 217 lb 9.6 oz (98.7 kg)   SpO2 99%   BMI 30.35 kg/m  Wt Readings from Last 3 Encounters:  12/24/22 217 lb 9.6 oz (98.7 kg)  12/17/22 220 lb (99.8 kg)  08/10/22 218 lb (98.9 kg)   Constitutional: overweight, in NAD Eyes: EOMI, no exophthalmos ENT: no thyromegaly, no cervical lymphadenopathy Cardiovascular: RRR, No MRG, + R>L LE swelling (pitting) - chronic Respiratory: CTA B Musculoskeletal: no deformities Skin: no rashes Neurological: no tremor with outstretched hands  ASSESSMENT: 1. DM2, non-insulin-dependent, uncontrolled, with complications - CKD stage 2  2. H/o Hurthle cell neoplasm  3.  Postsurgical hypothyroidism  4.  Obesity class I  PLAN:  1. Patient with longstanding, uncontrolled, type 2 diabetes, uncontrolled, treated with oral antidiabetic regimen with metformin, sulfonylurea, SGLT2 inhibitor, and weekly GLP-1/GIP receptor agonist, with still poor control.  His HbA1c levels are usually very high but these are out of proportion with his blood sugars at home and the fructosamine levels. -At last visit I suggested to change from Trulicity to [facility] for a stronger effect.  He was able to do so.  Lately, he was not able to obtain the 5 mg of Mounjaro so he just started the 2.5 mg dose.  Looking back in his log at the sugars while on the 5 mg of Mounjaro, they were still elevated.  I advised him to stop downstairs at the pharmacy to pick up the 5 mg of Mounjaro and to do this for 1 month.  Afterwards, we should continue to increase the dose and I am hoping that we can avoid the use of insulin (which he cannot use due to the restrictions) at the higher Mounjaro doses. - I suggested to:  Patient Instructions  Please continue: - Metformin 2000 mg with dinner -  Glimepiride 4 mg in am and 2 mg in pm - Jardiance 10 mg daily at night  Please restart: - Mounjaro 5 mg weekly >> please let me know in 1 month to increase the dose.  Please continue levothyroxine 100 mcg daily.  Take the thyroid hormone every day, with water, at least 30 minutes before breakfast, separated by at least 4 hours from: - acid reflux medications - calcium - iron - multivitamins  Please return in 4 months with your sugar log.  - we checked his HbA1c: 11.1% (however, reviewing his blood sugars at home, HbA1c is closer to 8%). - advised to check sugars at different times of the day - 1x a day, rotating check times - advised for yearly eye exams >> he is UTD - return to clinic in 4 months  2. H/o Hurthle cell neoplasm -Latest ultrasound report showed no suspicious masses in the lower neck.  The final left thyroid pathology was benign -On the ultrasound from 2017, he had an enlarged lymph node, however, this was not obvious on the latest ultrasound from 2019 -No problem swallowing, no choking, neck pressure -No further imaging needed unless he develops neck compression symptoms  3.  Postsurgical hypothyroidism - latest thyroid labs reviewed with pt. >> normal: Lab Results  Component Value Date   TSH 3.70 07/10/2022  - he continues on LT4 100 mcg daily - pt feels good on this dose. - we discussed about taking the thyroid hormone every day, with water, >30 minutes before breakfast, separated by >4 hours from acid reflux medications, calcium, iron, multivitamins. Pt. is taking it correctly.  4.  Obesity class I -continue SGLT 2 inhibitor and will restart the GLP-1/GIP receptor agonist which should also help with weight loss -He lost 12 pounds before the last 2 visits combined -Weight is approximately stable since last visit  Carlus Pavlov, MD PhD [facility] Endocrinology

## 2022-12-24 NOTE — Patient Instructions (Signed)
Please continue: - Metformin 2000 mg with dinner - Glimepiride 4 mg in am and 2 mg in pm - Jardiance 10 mg daily at night  Please restart: - Mounjaro 5 mg weekly >> please let me know in 1 month to increase the dose.  Please continue levothyroxine 100 mcg daily.  Take the thyroid hormone every day, with water, at least 30 minutes before breakfast, separated by at least 4 hours from: - acid reflux medications - calcium - iron - multivitamins  Please return in 4 months with your sugar log.

## 2023-01-16 ENCOUNTER — Telehealth: Payer: Self-pay

## 2023-01-16 MED ORDER — TIRZEPATIDE 7.5 MG/0.5ML ~~LOC~~ SOAJ: 7.5000 mg | SUBCUTANEOUS | 0 refills | Status: DC

## 2023-01-16 NOTE — Telephone Encounter (Signed)
Patient called requesting Mounjaro 7.5 mg. At his last visit Dr. Elvera Lennox advised for him to call back in one month for an increased dose after being on the 5 mg x 1 month. Ok to send in the 7.5 mg dose now. He states he is feeling good on the 5 mg and is ready to increase.

## 2023-01-16 NOTE — Telephone Encounter (Signed)
75 mg sent.

## 2023-01-18 ENCOUNTER — Encounter (HOSPITAL_COMMUNITY): Payer: Self-pay | Admitting: Gastroenterology

## 2023-01-21 ENCOUNTER — Other Ambulatory Visit: Payer: Self-pay

## 2023-01-21 MED ORDER — TIRZEPATIDE 5 MG/0.5ML ~~LOC~~ SOAJ: 5.0000 mg | SUBCUTANEOUS | 3 refills | Status: DC

## 2023-02-08 ENCOUNTER — Other Ambulatory Visit: Payer: Self-pay | Admitting: Family Medicine

## 2023-02-08 NOTE — Telephone Encounter (Signed)
Patient scheduled.

## 2023-02-08 NOTE — Telephone Encounter (Signed)
Pt needs CPE or Welcome to Medicare if he has transitioned. Thanks.

## 2023-03-10 ENCOUNTER — Other Ambulatory Visit: Payer: Self-pay | Admitting: Family Medicine

## 2023-03-10 DIAGNOSIS — Z862 Personal history of diseases of the blood and blood-forming organs and certain disorders involving the immune mechanism: Secondary | ICD-10-CM

## 2023-03-10 DIAGNOSIS — E119 Type 2 diabetes mellitus without complications: Secondary | ICD-10-CM

## 2023-03-10 DIAGNOSIS — E538 Deficiency of other specified B group vitamins: Secondary | ICD-10-CM

## 2023-03-12 ENCOUNTER — Other Ambulatory Visit: Payer: BC Managed Care – PPO

## 2023-03-18 ENCOUNTER — Encounter: Payer: BC Managed Care – PPO | Admitting: Family Medicine

## 2023-03-21 ENCOUNTER — Encounter (INDEPENDENT_AMBULATORY_CARE_PROVIDER_SITE_OTHER): Payer: Self-pay

## 2023-04-01 ENCOUNTER — Other Ambulatory Visit: Payer: Self-pay | Admitting: Family Medicine

## 2023-04-09 ENCOUNTER — Telehealth: Payer: Self-pay | Admitting: Gastroenterology

## 2023-04-09 NOTE — Telephone Encounter (Signed)
Inbound call from patient stating that he wanted to make a follow up appointment with Dr. Meridee Score. Patient was scheduled for 12/26 at 1:30. I believe patient was wanting to scheduled his hospital endo. Is there anyway to discuss with him? Please advise.  Thank you

## 2023-04-10 NOTE — Telephone Encounter (Signed)
I spoke with the pt and he agrees to the 11/20 office visit to discuss endo flex that he is due for in November.  He will call if he has any further questions or concerns.

## 2023-04-10 NOTE — Telephone Encounter (Signed)
Appt has been changed to 06/26/23 at 930 am with GM.  He is due for  Endo Flex in Nov.   Left message on machine to call back

## 2023-04-16 ENCOUNTER — Other Ambulatory Visit (INDEPENDENT_AMBULATORY_CARE_PROVIDER_SITE_OTHER): Payer: BC Managed Care – PPO

## 2023-04-16 DIAGNOSIS — E119 Type 2 diabetes mellitus without complications: Secondary | ICD-10-CM

## 2023-04-16 DIAGNOSIS — E538 Deficiency of other specified B group vitamins: Secondary | ICD-10-CM | POA: Diagnosis not present

## 2023-04-16 DIAGNOSIS — Z862 Personal history of diseases of the blood and blood-forming organs and certain disorders involving the immune mechanism: Secondary | ICD-10-CM | POA: Diagnosis not present

## 2023-04-16 LAB — CBC WITH DIFFERENTIAL/PLATELET
Basophils Absolute: 0 10*3/uL (ref 0.0–0.1)
Basophils Relative: 1.2 % (ref 0.0–3.0)
Eosinophils Absolute: 0.1 10*3/uL (ref 0.0–0.7)
Eosinophils Relative: 2.9 % (ref 0.0–5.0)
HCT: 39.8 % (ref 39.0–52.0)
Hemoglobin: 13 g/dL (ref 13.0–17.0)
Lymphocytes Relative: 16.7 % (ref 12.0–46.0)
Lymphs Abs: 0.7 10*3/uL (ref 0.7–4.0)
MCHC: 32.6 g/dL (ref 30.0–36.0)
MCV: 91.5 fl (ref 78.0–100.0)
Monocytes Absolute: 0.7 10*3/uL (ref 0.1–1.0)
Monocytes Relative: 17.4 % — ABNORMAL HIGH (ref 3.0–12.0)
Neutro Abs: 2.5 10*3/uL (ref 1.4–7.7)
Neutrophils Relative %: 61.8 % (ref 43.0–77.0)
Platelets: 212 10*3/uL (ref 150.0–400.0)
RBC: 4.35 Mil/uL (ref 4.22–5.81)
RDW: 14.8 % (ref 11.5–15.5)
WBC: 4 10*3/uL (ref 4.0–10.5)

## 2023-04-16 LAB — COMPREHENSIVE METABOLIC PANEL
ALT: 13 U/L (ref 0–53)
AST: 13 U/L (ref 0–37)
Albumin: 3.6 g/dL (ref 3.5–5.2)
Alkaline Phosphatase: 68 U/L (ref 39–117)
BUN: 22 mg/dL (ref 6–23)
CO2: 24 meq/L (ref 19–32)
Calcium: 9 mg/dL (ref 8.4–10.5)
Chloride: 102 meq/L (ref 96–112)
Creatinine, Ser: 1.14 mg/dL (ref 0.40–1.50)
GFR: 67.39 mL/min (ref >60.00)
Glucose, Bld: 152 mg/dL — ABNORMAL HIGH (ref 70–99)
Potassium: 4.2 meq/L (ref 3.5–5.1)
Sodium: 134 meq/L — ABNORMAL LOW (ref 135–145)
Total Bilirubin: 0.5 mg/dL (ref 0.2–1.2)
Total Protein: 6.7 g/dL (ref 6.0–8.3)

## 2023-04-16 LAB — MICROALBUMIN / CREATININE URINE RATIO
Creatinine,U: 93.3 mg/dL
Microalb Creat Ratio: 0.9 mg/g (ref 0.0–30.0)
Microalb, Ur: 0.8 mg/dL (ref 0.0–1.9)

## 2023-04-16 LAB — IRON: Iron: 17 ug/dL — ABNORMAL LOW (ref 42–165)

## 2023-04-16 LAB — LIPID PANEL
Cholesterol: 120 mg/dL (ref 0–200)
HDL: 40.8 mg/dL (ref >39.00)
LDL Cholesterol: 43 mg/dL (ref 0–99)
NonHDL: 79.21
Total CHOL/HDL Ratio: 3
Triglycerides: 181 mg/dL — ABNORMAL HIGH (ref 0.0–149.0)
VLDL: 36.2 mg/dL (ref 0.0–40.0)

## 2023-04-16 LAB — FERRITIN: Ferritin: 67.7 ng/mL (ref 22.0–322.0)

## 2023-04-16 LAB — TSH: TSH: 2.85 u[IU]/mL (ref 0.35–5.50)

## 2023-04-18 LAB — FRUCTOSAMINE: Fructosamine: 323 umol/L — ABNORMAL HIGH (ref 205–285)

## 2023-04-22 ENCOUNTER — Ambulatory Visit (INDEPENDENT_AMBULATORY_CARE_PROVIDER_SITE_OTHER): Payer: BC Managed Care – PPO | Admitting: Family Medicine

## 2023-04-22 ENCOUNTER — Telehealth: Payer: Self-pay

## 2023-04-22 ENCOUNTER — Encounter: Payer: Self-pay | Admitting: Family Medicine

## 2023-04-22 VITALS — BP 118/80 | HR 80 | Temp 98.0°F | Ht 71.0 in | Wt 216.0 lb

## 2023-04-22 DIAGNOSIS — Z0000 Encounter for general adult medical examination without abnormal findings: Secondary | ICD-10-CM | POA: Diagnosis not present

## 2023-04-22 DIAGNOSIS — Z7189 Other specified counseling: Secondary | ICD-10-CM

## 2023-04-22 DIAGNOSIS — E890 Postprocedural hypothyroidism: Secondary | ICD-10-CM

## 2023-04-22 DIAGNOSIS — E7800 Pure hypercholesterolemia, unspecified: Secondary | ICD-10-CM

## 2023-04-22 DIAGNOSIS — Z862 Personal history of diseases of the blood and blood-forming organs and certain disorders involving the immune mechanism: Secondary | ICD-10-CM

## 2023-04-22 DIAGNOSIS — E119 Type 2 diabetes mellitus without complications: Secondary | ICD-10-CM

## 2023-04-22 NOTE — Telephone Encounter (Signed)
-----   Message from Crawford Givens sent at 04/22/2023 11:32 AM EDT ----- Please get eye clinic report from [facility].  Thanks.

## 2023-04-22 NOTE — Patient Instructions (Addendum)
Flu shot when feeling better.  Then pneumonia 20 shot later.   Take care.  Glad to see you.  Recheck in about 6 months, labs ahead of time.

## 2023-04-22 NOTE — Telephone Encounter (Signed)
Request faxed as directed.

## 2023-04-22 NOTE — Progress Notes (Unsigned)
CPE- See plan.  Routine anticipatory guidance given to patient.  See health maintenance.  The possibility exists that previously documented standard health maintenance information may have been brought forward from a previous encounter into this note.  If needed, that same information has been updated to reflect the current situation based on today's encounter.    covid vaccine prev done.   Tetanus 2020 Flu to be done this fall.   PNA d/w pt.   Shingles prev done.  J pouch eval by GI 2024 Prostate cancer screening and PSA options (with potential risks and benefits of testing vs not testing) were discussed along with recent recs/guidelines.  He declined testing PSA at this point. Living will d/w pt.  Wife designated if patient were incapacitated.   Diet and exercise d/w pt.  HIV and HCV prev checked 2017.    He had recent URI, improving, resolved.    Diabetes:   Using medications without difficulties: yes Hypoglycemic episodes:no Hyperglycemic episodes:no Feet problems: no Blood Sugars averaging: usually ~160 eye exam within last year: done last week.  [facility].    Elevated Cholesterol: Using medications without problems: yes Muscle aches: no  Diet compliance: d/w pt.  Exercise: d/w pt.  Labs d/w pt.    Hypothyroidism.  No ADE on med, no neck mass. Labs d/w pt.  Compliant.    H/o low iron.  HGB still wnl.    PMH and SH reviewed  Meds, vitals, and allergies reviewed.   ROS: Per HPI.  Unless specifically indicated otherwise in HPI, the patient denies:  General: fever. Eyes: acute vision changes ENT: sore throat Cardiovascular: chest pain Respiratory: SOB GI: vomiting GU: dysuria Musculoskeletal: acute back pain Derm: acute rash Neuro: acute motor dysfunction Psych: worsening mood Endocrine: polydipsia Heme: bleeding Allergy: hayfever  GEN: nad, alert and oriented HEENT: mucous membranes moist Sinuses not ttp.   NECK: supple w/o LA CV: rrr. PULM: ctab, no inc  wob ABD: soft, +bs EXT: trace BLE edema SKIN: no acute rash  Diabetic foot exam: Normal inspection No skin breakdown No calluses  Normal DP pulses Normal sensation to light touch and monofilament Nails normal

## 2023-04-24 NOTE — Assessment & Plan Note (Signed)
covid vaccine prev done.   Tetanus 2020 Flu to be done this fall.   PNA d/w pt.   Shingles prev done.  J pouch eval by GI 2024 Prostate cancer screening and PSA options (with potential risks and benefits of testing vs not testing) were discussed along with recent recs/guidelines.  He declined testing PSA at this point. Living will d/w pt.  Wife designated if patient were incapacitated.   Diet and exercise d/w pt.  HIV and HCV prev checked 2017.

## 2023-04-24 NOTE — Assessment & Plan Note (Signed)
Has endocrine follow-up pending.  Fructosamine converts to an A1c of 7.1.  No hypoglycemia.  Continue glimepiride Jardiance metformin and Mounjaro.

## 2023-04-24 NOTE — Assessment & Plan Note (Signed)
No ADE on med, no neck mass. Labs d/w pt.  Compliant.  Continue levothyroxine as is.

## 2023-04-24 NOTE — Assessment & Plan Note (Signed)
Continue atorvastatin.  Continue work on diet and exercise. 

## 2023-04-24 NOTE — Assessment & Plan Note (Signed)
H/o low iron.  HGB still wnl.  GI history noted and discussed.  Ferritin is still normal and with normal hemoglobin I would continue as is.  We can recheck periodically.

## 2023-04-24 NOTE — Assessment & Plan Note (Signed)
Living will d/w pt.  Wife designated if patient were incapacitated.   ?

## 2023-04-25 ENCOUNTER — Ambulatory Visit: Payer: BC Managed Care – PPO | Admitting: Internal Medicine

## 2023-05-07 ENCOUNTER — Other Ambulatory Visit: Payer: Self-pay | Admitting: Family Medicine

## 2023-05-15 ENCOUNTER — Other Ambulatory Visit: Payer: Self-pay

## 2023-05-15 MED ORDER — TIRZEPATIDE 7.5 MG/0.5ML ~~LOC~~ SOAJ: 7.5000 mg | SUBCUTANEOUS | 1 refills | Status: DC

## 2023-05-15 NOTE — Telephone Encounter (Signed)
Requested Prescriptions   Signed Prescriptions Disp Refills   tirzepatide (MOUNJARO) 7.5 MG/0.5ML Pen 6 mL 1    Sig: Inject 7.5 mg into the skin once a week.    Authorizing Provider: Carlus Pavlov    Ordering User: Pollie Meyer

## 2023-05-24 ENCOUNTER — Encounter: Payer: Self-pay | Admitting: Internal Medicine

## 2023-05-24 ENCOUNTER — Ambulatory Visit: Payer: BC Managed Care – PPO | Admitting: Internal Medicine

## 2023-05-24 VITALS — BP 120/70 | HR 68 | Ht 71.0 in | Wt 221.8 lb

## 2023-05-24 DIAGNOSIS — Z7985 Long-term (current) use of injectable non-insulin antidiabetic drugs: Secondary | ICD-10-CM | POA: Diagnosis not present

## 2023-05-24 DIAGNOSIS — E66811 Obesity, class 1: Secondary | ICD-10-CM | POA: Diagnosis not present

## 2023-05-24 DIAGNOSIS — E890 Postprocedural hypothyroidism: Secondary | ICD-10-CM | POA: Diagnosis not present

## 2023-05-24 DIAGNOSIS — D34 Benign neoplasm of thyroid gland: Secondary | ICD-10-CM

## 2023-05-24 DIAGNOSIS — E1122 Type 2 diabetes mellitus with diabetic chronic kidney disease: Secondary | ICD-10-CM | POA: Diagnosis not present

## 2023-05-24 DIAGNOSIS — Z7984 Long term (current) use of oral hypoglycemic drugs: Secondary | ICD-10-CM

## 2023-05-24 DIAGNOSIS — N182 Chronic kidney disease, stage 2 (mild): Secondary | ICD-10-CM | POA: Diagnosis not present

## 2023-05-24 NOTE — Progress Notes (Signed)
Patient ID: Cory Harper, male   DOB: 1956/03/16, 67 y.o.   MRN: [id number]   HPI: Cory Harper is a 67 y.o.-year-old male, returning for follow-up for DM2, dx in 1998, non-insulin-dependent, uncontrolled, with long term complications (+ mild CKD).  Last visit 5 months ago  Interim history: No increased urination, blurry vision, nausea, chest pain. 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  He is still very busy and still not exercising.  Also, he continues to work long hours: Wakes up at 3:30 am, starts the day at 4:45 am >> dinner is late.  Reviewed his HbA1c levels: 04/16/2023: HbA1c calculated from fructosamine is 7.1% Lab Results  Component Value Date   HGBA1C 11.1 (A) 12/24/2022   HGBA1C 11.1 (H) 01/05/2022   HGBA1C 9.9 (A) 04/11/2021  07/10/2022: HbA1c calculated from fructosamine was 9.2%, higher 09/01/2021: HbA1c calculated from fructosamine is 8.36%, slightly higher than before.   06/13/2021: HbA1c calculated from fructosamine is 7.9% 12/06/2020: HbA1c calculated from fructosamine is 7.8%, higher than before, but lower than the directly measured HbA1c. 04/01/2020: HbA1c calculated from fructosamine is 7.4%, correlating better with your sugars at home. 11/18/2019: HbA1c calculated from fructosamine is 7.5%. 06/17/2019: HbA1c calculated from fructosamine is 7.0%! 05/22/2018: HbA1c calculated from the fructosamine is much better than the measured one, at 7.6%.  He is on: - Metformin 1000 mg 2x a day with meals >> 2000 mg with dinner - Amaryl 4 mg in am and 2 mg in pm - Jardiance 10 mg daily at night to avoid daytime urinary frequency-started 03/2017 - Trulicity 4.5 mg weekly >> Mounjaro 5 mg weekly >> off >> Mounjaro 5 (started 02/2023) >> 7.5 mg weekly (started 03/2023) Tried Actos >> signif. Weight gain On Cinnamon. He was on Januvia, Ozempic.  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  No plans for retirement yet, but ~in 2.5  years.  Pt checks his sugars 1-2 times a day -he declined a CGM: - am:  135, 160-166 >> 125, 156-180 >> 86, 127-180 - 2h after b'fast: 122-155 >> n/c >> 158-220 >> 128-180 - before lunch: 130-155, 160 >> 161-165 >> 172-180 >> n/c - 2h after lunch: 155 >> 161 >> 180, 183 >> 175-180 - before dinner:  123 >> n/c >> 151, 160-164 >> n/c  - 2h after dinner: 150-161 >> 160, 161 >> 180 >> 171-178 - bedtime: 150-156 >> n/c>> 158, 166 >> 165 >> 169 - nighttime: n/c >> 180 >> n/c Lowest sugar was  80 >> 102 >> 125 >> 86; it is unclear at which CBG level he has hypoglycemia awareness. Highest sugar was 160 >> 161 >> 220 >> 180.  Glucometer: One Touch Ultra mini>> One Touch Ultra  Pt's meals are: - Breakfast: bowl of grits, [place] bacon before starts work, - Lunch: [place] or ham+cheese wrap or tuna salad - Dinner: baked chicken or pork chop, green beans - Snacks: crackers, desserts  Previously using a stationary bike in the evening -not recently.  -+ Mild CKD, last BUN/creatinine:  Lab Results  Component Value Date   BUN 22 04/16/2023   BUN 19 07/10/2022   CREATININE 1.14 04/16/2023   CREATININE 1.08 07/10/2022   Lab Results  Component Value Date   GFRNONAA 68 (L) 09/22/2012   Lab Results  Component Value Date   MICRALBCREAT 0.9 04/16/2023   MICRALBCREAT 2.0 07/10/2022   MICRALBCREAT 0.7 06/13/2021   MICRALBCREAT 0.9 06/27/2020  MICRALBCREAT 1.4 05/01/2018   MICRALBCREAT 0.5 03/12/2017   MICRALBCREAT 0.4 12/16/2015   MICRALBCREAT 2.3 02/18/2015   MICRALBCREAT 0.2 03/11/2014   MICRALBCREAT 0.7 04/27/2013   + HL; last set of lipids: Lab Results  Component Value Date   CHOL 120 04/16/2023   HDL 40.80 04/16/2023   LDLCALC 43 04/16/2023   LDLDIRECT 54.0 07/10/2022   TRIG 181.0 (H) 04/16/2023   CHOLHDL 3 04/16/2023  On Lipitor 20. On ASA 81.  - last eye exam was 04/16/2023: No DR.  -No numbness and tingling in his feet.  Last foot exam 04/22/2023.  Pt has no FH of  DM.  He has familial polyposis and has a history of colon cancer >> has a J-pouch - he had a pouchoscopy in 07/2019.  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  Thyroid ultrasound (02/2018): 1. Post left thyroid lobectomy without evidence of locally residual or locally recurrent disease. 2. Normal appearance of the remaining thyroid parenchyma.  Pt denies: - feeling nodules in neck - hoarseness - dysphagia - choking  Postsurgical hypothyroidism:  Pt is on levothyroxine 100 mcg daily, taken: - in am - fasting - at least 30 min from b'fast - no Ca, MVI, PPIs - on Fe 2x a day - am (30 min after LT4) and evening >> moved later in the day - not on Biotin  Reviewed his TFTs: Lab Results  Component Value Date   TSH 2.85 04/16/2023   TSH 3.70 07/10/2022   TSH 3.59 12/27/2020   TSH 4.12 01/26/2020   TSH 3.33 06/17/2019   TSH 3.01 05/22/2018   TSH 3.46 07/25/2017   TSH 2.29 06/27/2016   TSH 1.65 06/14/2015   TSH 1.63 03/11/2014   ROS: + see HPI + leg swelling  I reviewed pt's medications, allergies, PMH, social hx, family hx, and changes were documented in the history of present illness. Otherwise, unchanged from my initial visit note.  Past Medical History:  Diagnosis Date   Anemia    Arthritis    Cancer (HCC) 2013   stg II colon cancer s/p resection   Colonic mass    Diabetes mellitus    FAP (familial adenomatous polyposis)    Heart murmur    Hyperlipidemia    Hypertension    not on blood pressure meds since 11/13    Hypothyroidism    Nasal congestion    Past Surgical History:  Procedure Laterality Date   BIOPSY  01/15/2018   Procedure: BIOPSY;  Surgeon: [name], MD;  Location: [facility];  Service: Endoscopy;;   BIOPSY  07/10/2019   Procedure: BIOPSY;  Surgeon: [name], MD;  Location: [facility];  Service: General;;   BIOPSY  01/27/2021    Procedure: BIOPSY;  Surgeon: [name], MD;  [facility]: [facility];  Service: Endoscopy;;   BIOPSY  05/10/2022   Procedure: BIOPSY;  Surgeon: Lemar Lofty., MD;  Location: [facility];  Service: Gastroenterology;;   BIOPSY  08/02/2022   Procedure: BIOPSY;  Surgeon: Lemar Lofty., MD;  [facility]: [facility];  Service: Gastroenterology;;   BIOPSY  12/17/2022   Procedure: BIOPSY;  Surgeon: Lemar Lofty., MD;  [facility];  Service: Gastroenterology;;   BOWEL RESECTION  05/20/2012   COLON RESECTION  05/20/2012   Procedure: COLON RESECTION LAPAROSCOPIC;  Surgeon: Ardeth Sportsman, MD;  Location: [facility];  Service: General;  Laterality: N/A;  Laparoscopic  Proctocolectomy, Ileal Pouch Anal Anastomoisis, Loop Ileostomy   ENDOSCOPIC MUCOSAL RESECTION  08/02/2022   Procedure: ENDOSCOPIC MUCOSAL RESECTION;  Surgeon: Lemar Lofty., MD;  [facility];  Service: Gastroenterology;;   ESOPHAGOGASTRODUODENOSCOPY (EGD) WITH PROPOFOL N/A 04/23/2013   Procedure: ESOPHAGOGASTRODUODENOSCOPY (EGD) WITH PROPOFOL;  Surgeon: Rachael Fee, MD;  [facility];  Service: Endoscopy;  Laterality: N/A;  side viewing scope   ESOPHAGOGASTRODUODENOSCOPY (EGD) WITH PROPOFOL N/A 12/17/2022   Procedure: ESOPHAGOGASTRODUODENOSCOPY (EGD) WITH PROPOFOL;  Surgeon: Meridee Score Netty Starring., MD;  Location: [facility];  Service: Gastroenterology;  Laterality: N/A;   FLEXIBLE SIGMOIDOSCOPY  08/01/2012   Procedure: FLEXIBLE SIGMOIDOSCOPY;  Surgeon: [name], MD;  [facility];  Service: Endoscopy;  Laterality: N/A;  use endoscope   FLEXIBLE SIGMOIDOSCOPY N/A 11/29/2014   Procedure: FLEXIBLE SIGMOIDOSCOPY/POUCHOSCOPY;  Surgeon: [name], MD;  [facility];  Service: Endoscopy;  Laterality: N/A;   FLEXIBLE SIGMOIDOSCOPY N/A 05/10/2022   Procedure: FLEXIBLE SIGMOIDOSCOPY;  Surgeon: Meridee Score Netty Starring., MD;  Location: [facility];   Service: Gastroenterology;  Laterality: N/A;   FLEXIBLE SIGMOIDOSCOPY N/A 12/17/2022   Procedure: FLEXIBLE SIGMOIDOSCOPY;  Surgeon: Meridee Score Netty Starring., MD;  Location: [facility];  Service: Gastroenterology;  Laterality: N/A;   FOREIGN BODY REMOVAL  08/02/2022   Procedure: FOREIGN BODY REMOVAL;  Surgeon: Meridee Score Netty Starring., MD;  Location: [facility];  Service: Gastroenterology;;   ILEOSTOMY  05/20/12   ILEOSTOMY CLOSURE N/A 09/18/2012   Procedure: Loop Ileostomy Takedown with EUA ;  Surgeon: Ardeth Sportsman, MD;  Location: [facility];  Service: General;  Laterality: N/A;  loop ileostomy takedown with EUA    KNEE ARTHROSCOPY  1998   Right knee   POLYPECTOMY  05/10/2022   Procedure: POLYPECTOMY;  Surgeon: Lemar Lofty., MD;  Location: [facility];  Service: Gastroenterology;;   POLYPECTOMY  08/02/2022   Procedure: POLYPECTOMY;  Surgeon: Lemar Lofty., MD;  Location: [facility];  Service: Gastroenterology;;   POLYPECTOMY  12/17/2022   Procedure: POLYPECTOMY;  Surgeon: Lemar Lofty., MD;  Location: [facility];  Service: Gastroenterology;;   POUCHOSCOPY  11/16/2013   Procedure: POUCHOSCOPY;  Surgeon: [name], MD;  [facility];  Service: Endoscopy;;   POUCHOSCOPY N/A 11/16/2015   Procedure: POUCHOSCOPY;  Surgeon: [name], MD;  [facility];  Service: Endoscopy;  Laterality: N/A;   POUCHOSCOPY N/A 12/20/2016   Procedure: POUCHOSCOPY;  Surgeon: [name], MD;  [facility];  Service: Endoscopy;  Laterality: N/A;   POUCHOSCOPY N/A 01/15/2018   Procedure: POUCHOSCOPY;  Surgeon: [name], MD;  [facility];  Service: Endoscopy;  Laterality: N/A;   POUCHOSCOPY N/A 07/10/2019   Procedure: POUCHOSCOPY;  Surgeon: [name], MD;  Location: [facility];  Service: General;  Laterality: N/A;   POUCHOSCOPY N/A 01/27/2021   Procedure: POUCHOSCOPY;  Surgeon: [name], MD;  [facility];   Service: Endoscopy;  Laterality: N/A;   POUCHOSCOPY N/A 08/02/2022   Procedure: POUCHOSCOPY;  Surgeon: Mansouraty, Netty Starring., MD;  Location: [facility];  Service: Gastroenterology;  Laterality: N/A;   THYROID LOBECTOMY  05/20/2012   Procedure: THYROID LOBECTOMY;  Surgeon: Ardeth Sportsman, MD;  Location: [facility];  Service: General;  Laterality: Left;  LEFT THYROID LOBECTOMY   UPPER GASTROINTESTINAL ENDOSCOPY     WISDOM TOOTH EXTRACTION     Social History   Socioeconomic History   Marital status: Married    Spouse name: June   Number of children: 0  Social Needs  Occupational History   Occupation:  bus driver    Employer: [employer]    Comment: 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, 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  Tobacco Use   Smoking status: Former Smoker    Last attempt to quit: 08/07/1991    Years since quitting: 25.9   Smokeless tobacco: Never Used   Tobacco comment: quit in 1993  Substance and Sexual Activity   Alcohol use: No   Drug use: No   Sexual activity: Not on file  Other Topics Concern   Not on file  Social History Narrative   Radio DJ- [employer])   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     aspirin 81 MG tablet Take 1 tablet (81 mg total) by mouth daily. 30 tablet    atorvastatin (LIPITOR) 20 MG tablet TAKE 1 TABLET(20 MG) BY MOUTH DAILY 90 tablet 3   Blood Glucose Monitoring Suppl (ONE TOUCH ULTRA SYSTEM KIT) W/DEVICE KIT Dispense 1 kit with 100 lancets and strips, with 3 rf on lancets and strips.  Check sugar daily.  Dx E11.9 1 each 0   cholecalciferol (VITAMIN D) 1000 UNITS tablet Take 1,000 Units by mouth in the morning.     cyanocobalamin (VITAMIN B12) 1000 MCG tablet Take 1 tablet (1,000 mcg total) by mouth  once a week.     FEROSUL 325 (65 Fe) MG tablet TAKE 1 TABLET(325 MG) BY MOUTH AT BEDTIME 90 tablet 0   fluticasone (FLONASE) 50 MCG/ACT nasal spray SHAKE LIQUID AND USE 2 SPRAYS IN EACH NOSTRIL DAILY 48 g 1   glimepiride (AMARYL) 2 MG tablet TAKE 2 TABLETS BY MOUTH IN THE MORNING THEN TAKE 1 TABLET BY MOUTH IN THE EVENING 270 tablet 3   JARDIANCE 10 MG TABS tablet TAKE 1 TABLET BY MOUTH DAILY 90 tablet 3   levothyroxine (SYNTHROID) 100 MCG tablet TAKE 1 TABLET(100 MCG) BY MOUTH EVERY MORNING 90 tablet 3   loperamide (ANTI-DIARRHEAL) 2 MG tablet TAKE 1-2 TABLETS BY MOUTH TWICE DAILY AS NEEDED 360 tablet 3   loratadine (CLARITIN) 10 MG tablet Take 10 mg by mouth in the morning.     metFORMIN (GLUCOPHAGE) 1000 MG tablet TAKE 2 TABLETS BY MOUTH DAILY 180 tablet 3   omeprazole (PRILOSEC) 40 MG capsule Take 1 capsule (40 mg total) by mouth 2 (two) times daily before a meal. 60 capsule 6   ONETOUCH ULTRA test strip USE AS DIRECTED TO TEST BLOOD GLUCOSE DAILY 100 strip 2   tirzepatide (MOUNJARO) 7.5 MG/0.5ML Pen Inject 7.5 mg into the skin once a week. 6 mL 1   No current facility-administered medications on file prior to visit.   Allergies  Allergen Reactions   Ibuprofen Swelling    Made his joints swell with high doses   Family History  Problem Relation Age of Onset   Uterine cancer Mother    Hypertension Mother    Stroke Mother    Colon cancer Mother    Colon polyps Mother    Cancer Mother        colon, endometrial   Diabetes Father    Obesity Father    Pneumonia Father    Cancer Father        skin - squamous  Breast cancer Sister    Diabetes Paternal Grandmother    Prostate cancer Neg Hx    Esophageal cancer Neg Hx    PE: BP 120/70   Pulse 68   Ht 5\' 11"  (1.803 m)   Wt 221 lb 12.8 oz (100.6 kg)   SpO2 96%   BMI 30.93 kg/m  Wt Readings from Last 3 Encounters:  05/24/23 221 lb 12.8 oz (100.6 kg)  04/22/23 216 lb (98 kg)  12/24/22 217 lb 9.6 oz (98.7 kg)    Constitutional: overweight, in NAD Eyes: EOMI, no exophthalmos ENT: no thyromegaly, no cervical lymphadenopathy Cardiovascular: RRR, No MRG, + R>L LE swelling (pitting) - chronic Respiratory: CTA B Musculoskeletal: no deformities Skin: no rashes Neurological: no tremor with outstretched hands  ASSESSMENT: 1. DM2, non-insulin-dependent, uncontrolled, with complications - CKD stage 2  2. H/o Hurthle cell neoplasm  3.  Postsurgical hypothyroidism  4.  Obesity class I  PLAN:  1. Patient with longstanding, uncontrolled, type 2 diabetes, treated with oral antidiabetic regimen with metformin, sulfonylurea, SGLT2 inhibitor, and also weekly GLP-1/GIP receptor agonist -we stopped Trulicity and switched to [facility].  His HbA1c levels are usually very high, but these are out of proportion with his blood sugars at home and the fructosamine levels.  At last visit, his HbA1c was 11.1%, however, per review of blood sugars at home, it should not be close to 8%.  On 04/16/2023, he had a fructosamine level checked and the calculated HbA1c was actually 7.1%. - at today's visit, sugars appear to be slightly improved, but overall not much change compared to before.  I am surprised of the improvement in his HbA1c and because of this, I will advise him to continue the current regimen.  Because of his sugars remaining above target in the morning and before meals, I absolutely believe that he will need a low-dose of basal insulin, but unfortunately we could not start this at least not until he retired which he anticipates to be in approximately 2.5 years. - I suggested to:  Patient Instructions  Please continue: - Metformin 2000 mg with dinner - Glimepiride 4 mg in am and 2 mg in pm - Jardiance 10 mg daily at night - Mounjaro 7.5 mg weekly   Please continue levothyroxine 100 mcg daily.  Take the thyroid hormone every day, with water, at least 30 minutes before breakfast, separated by at least 4 hours  from: - acid reflux medications - calcium - iron - multivitamins  Please return in 4 months with your sugar log.  - advised to check sugars at different times of the day - 1-2x a day, rotating check times - advised for yearly eye exams >> he is UTD - return to clinic in 4 months  2. H/o Hurthle cell neoplasm -Latest ultrasound report showed no suspicious masses in the lower neck.  The final left thyroid pathology was benign -On the ultrasound from 2017, he had an enlarged lymph node, however, this was not obvious on the latest ultrasound from 2019 -No problems swallowing, choking, neck pressure -No further imaging needed unless he develops neck compression symptoms or masses felt on palpation of his neck  3.  Postsurgical hypothyroidism - latest thyroid labs reviewed with pt. >> normal: Lab Results  Component Value Date   TSH 2.85 04/16/2023  - he continues on LT4 100 mcg daily - pt feels good on this dose. - we discussed about taking the thyroid hormone every day, with water, >30 minutes before breakfast, separated  by >4 hours from acid reflux medications, calcium, iron, multivitamins. Pt. is taking it correctly.  4.  Obesity class I -continue SGLT 2 inhibitor and GLP-1/GIP receptor agonist which should also help with weight loss -we increased the dose of Mounjaro since last visit -Weight was stable at last visit, previously lost 12 pounds -He gained 4 pounds since last visit  Carlus Pavlov, MD PhD [facility] Endocrinology

## 2023-05-24 NOTE — Patient Instructions (Signed)
Please continue: - Metformin 2000 mg with dinner - Glimepiride 4 mg in am and 2 mg in pm - Jardiance 10 mg daily at night - Mounjaro 7.5 mg weekly   Please continue levothyroxine 100 mcg daily.  Take the thyroid hormone every day, with water, at least 30 minutes before breakfast, separated by at least 4 hours from: - acid reflux medications - calcium - iron - multivitamins  Please return in 4 months with your sugar log.

## 2023-06-17 ENCOUNTER — Inpatient Hospital Stay: Payer: BC Managed Care – PPO | Attending: Oncology | Admitting: Oncology

## 2023-06-17 VITALS — BP 124/64 | HR 89 | Temp 98.1°F | Resp 18 | Ht 71.0 in | Wt 225.2 lb

## 2023-06-17 DIAGNOSIS — Z79899 Other long term (current) drug therapy: Secondary | ICD-10-CM | POA: Insufficient documentation

## 2023-06-17 DIAGNOSIS — K5140 Inflammatory polyps of colon without complications: Secondary | ICD-10-CM | POA: Insufficient documentation

## 2023-06-17 DIAGNOSIS — E119 Type 2 diabetes mellitus without complications: Secondary | ICD-10-CM | POA: Insufficient documentation

## 2023-06-17 DIAGNOSIS — C180 Malignant neoplasm of cecum: Secondary | ICD-10-CM | POA: Insufficient documentation

## 2023-06-17 DIAGNOSIS — Z8719 Personal history of other diseases of the digestive system: Secondary | ICD-10-CM | POA: Insufficient documentation

## 2023-06-17 NOTE — Progress Notes (Signed)
  [facility] OFFICE PROGRESS NOTE   Diagnosis: Colon cancer  INTERVAL HISTORY:   Cory Harper returns as scheduled.  He feels well.  He continues to have multiple bowel movements per day.  He takes Imodium.  This provides partial relief.  He continues endoscopic surveillance with Dr. Meridee Score.  Multiple ileal polyps were removed in May.  Biopsy of the stomach and a GE junction nodule revealed no malignancy.  The ileal biopsies returned as tubular adenomas.    Objective:  Vital signs in last 24 hours:  Blood pressure 124/64, pulse 89, temperature 98.1 F (36.7 C), temperature source Oral, resp. rate 18, height 5\' 11"  (1.803 m), weight 225 lb 3.2 oz (102.2 kg), SpO2 98%.    Lymphatics: No cervical, supraclavicular, axillary, or inguinal nodes Resp: Lungs clear bilaterally Cardio: Regular rate and rhythm GI: No hepatosplenomegaly, nontender, no mass Vascular: No leg edema  Lab Results:  Lab Results  Component Value Date   WBC 4.0 04/16/2023   HGB 13.0 04/16/2023   HCT 39.8 04/16/2023   MCV 91.5 04/16/2023   PLT 212.0 04/16/2023   NEUTROABS 2.5 04/16/2023    CMP  Lab Results  Component Value Date   NA 134 (L) 04/16/2023   K 4.2 04/16/2023   CL 102 04/16/2023   CO2 24 04/16/2023   GLUCOSE 152 (H) 04/16/2023   BUN 22 04/16/2023   CREATININE 1.14 04/16/2023   CALCIUM 9.0 04/16/2023   PROT 6.7 04/16/2023   ALBUMIN 3.6 04/16/2023   AST 13 04/16/2023   ALT 13 04/16/2023   ALKPHOS 68 04/16/2023   BILITOT 0.5 04/16/2023   GFRNONAA 68 (L) 09/22/2012   GFRAA 79 (L) 09/22/2012    Lab Results  Component Value Date   [name]1 1.45 06/16/2018   [name] <0.5 06/15/2016     Medications: I have reviewed the patient's current medications.   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 Ileostomy takedown  09/18/2012.   2. Familial polyposis with multiple adenomatous polyps noted on the colectomy specimen 05/20/2012 -followed by Dr. Meridee Score and Dr. Maisie Fus for surveillance of the J-pouch 3. 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   4. Diabetes.   5. Status post endoscopic removal of a duodenal polyp at [facility] on 06/08/2013 and 10/25/2014, removal of adenoma at the minor papilla at [facility] 05/21/2017, duodenal polyps at [facility] 11/26/2017-adenomatous polyps removed near the papilla, [facility] EGD 02/09/2019 21-2 mm mucosal nodules in the first portion of the duodenum biopsy-chronic active duodenitis, negative for dysplasia Upper endoscopy 02/05/2022-soft inflamed nodule below the GE junction removed-exam otherwise normal, biopsy negative for intestinal metaplasia or dysplasia Upper endoscopy 12/17/2022-inflammatory polyp at the GE junction     Disposition: Cory Harper remains in clinical remission from colon cancer.  He is now greater than 10 years out from diagnosis.  He would like to continue yearly follow-up at the Cancer center.  He continues upper and lower endoscopic surveillance with Dr. Meridee Score. He will return for an office visit in 1 year.  I am available to see him as needed.  Thornton Papas, MD  06/17/2023  12:01 PM

## 2023-06-26 ENCOUNTER — Other Ambulatory Visit (HOSPITAL_COMMUNITY): Payer: Self-pay

## 2023-06-26 ENCOUNTER — Ambulatory Visit: Payer: BC Managed Care – PPO | Admitting: Gastroenterology

## 2023-06-26 ENCOUNTER — Encounter: Payer: Self-pay | Admitting: Gastroenterology

## 2023-06-26 VITALS — BP 124/60 | HR 92 | Ht 67.5 in | Wt 226.4 lb

## 2023-06-26 DIAGNOSIS — D1391 Familial adenomatous polyposis: Secondary | ICD-10-CM

## 2023-06-26 DIAGNOSIS — K209 Esophagitis, unspecified without bleeding: Secondary | ICD-10-CM | POA: Diagnosis not present

## 2023-06-26 DIAGNOSIS — Z85038 Personal history of other malignant neoplasm of large intestine: Secondary | ICD-10-CM

## 2023-06-26 DIAGNOSIS — Z860101 Personal history of adenomatous and serrated colon polyps: Secondary | ICD-10-CM | POA: Diagnosis not present

## 2023-06-26 NOTE — Progress Notes (Signed)
GASTROENTEROLOGY OUTPATIENT CLINIC VISIT   Primary Care Provider [name], MD [address] [place] [address] [phone]  Referring Provider [name], MD [address] [place],  [place] [address] [phone]  Patient Profile: Cory Harper is a 67 y.o. male with a pmh significant for Colon cancer (status post previous resection with finding of FAP), status post pouch with recurrent adenomas, esophagitis, family history colon cancer (mother), hyperlipidemia, diabetes.  The patient presents to the [facility] for an evaluation and management of problem(s) noted below:  Problem List 1. Familial adenomatous polyposis coli, s/p proctocolectomy/J pouch 05/20/2012   2. Hx of adenomatous colonic polyps   3. Esophagitis   4. History of colon cancer    Discussed the use of AI scribe software for clinical note transcription with the patient, who gave verbal consent to proceed.  History of Present Illness Please see prior GI notes for full details of HPI.  Interval History The patient presents for follow-up.  I last saw the patient in May for his EGD/Pouchoscopy.  I took over his care from my partner Dr. Christella Hartigan in the last few years.  He has a history of FAP noted after his colon cancer diagnosis and prior resection years ago.  He reports no new symptoms or discomfort since the last procedure.  We did find that he had esophagitis on his EGD, but he was asymptomatic.  He has been maintained on PPI therapy twice daily and now once daily.  Again, he has no symptoms of dysphagia or odynophagia.  He has also incorporated chewing gum after meals, a practice he learned could help with reflux.  No changes in his bowel habits.  No blood in his stools has been noted.  Recent blood work showed a normal hemoglobin and a slightly low Iron but normal ferritin.  This has not been rechecked.  He remembers discussion with Dr. Maisie Fus and really hopes to not  need a repeat pouch/ostomy and is open to continuing pouchoscopies and polyp removals as necessary.   GI Review of Systems Positive as above Negative for dysphagia, odynophagia, pyrosis, pain, nausea, vomiting, alteration of bowel habits, melena, hematochezia  Review of Systems General: Denies fevers/chills/weight loss unintentionally Cardiovascular: Denies chest pain Pulmonary: Denies shortness of breath Gastroenterological: See HPI Genitourinary: Denies darkened urine Hematological: Denies easy bruising/bleeding Dermatological: Denies jaundice Psychological: Mood is stable   Medications Current Outpatient Medications  Medication Sig Dispense Refill   acetaminophen (TYLENOL) 500 MG tablet Take 500-1,000 mg by mouth every 6 (six) hours as needed for moderate pain.     aspirin 81 MG tablet Take 1 tablet (81 mg total) by mouth daily. 30 tablet    atorvastatin (LIPITOR) 20 MG tablet TAKE 1 TABLET(20 MG) BY MOUTH DAILY 90 tablet 3   Blood Glucose Monitoring Suppl (ONE TOUCH ULTRA SYSTEM KIT) W/DEVICE KIT Dispense 1 kit with 100 lancets and strips, with 3 rf on lancets and strips.  Check sugar daily.  Dx E11.9 1 each 0   cholecalciferol (VITAMIN D) 1000 UNITS tablet Take 1,000 Units by mouth in the morning.     cyanocobalamin (VITAMIN B12) 1000 MCG tablet Take 1 tablet (1,000 mcg total) by mouth once a week.     FEROSUL 325 (65 Fe) MG tablet TAKE 1 TABLET(325 MG) BY MOUTH AT BEDTIME 90 tablet 0   fluticasone (FLONASE) 50 MCG/ACT nasal spray SHAKE LIQUID AND USE 2 SPRAYS IN EACH NOSTRIL DAILY 48 g 1  glimepiride (AMARYL) 2 MG tablet TAKE 2 TABLETS BY MOUTH IN THE MORNING THEN TAKE 1 TABLET BY MOUTH IN THE EVENING 270 tablet 3   JARDIANCE 10 MG TABS tablet TAKE 1 TABLET BY MOUTH DAILY 90 tablet 3   levothyroxine (SYNTHROID) 100 MCG tablet TAKE 1 TABLET(100 MCG) BY MOUTH EVERY MORNING 90 tablet 3   loperamide (ANTI-DIARRHEAL) 2 MG tablet TAKE 1-2 TABLETS BY MOUTH TWICE DAILY AS NEEDED 360  tablet 3   loratadine (CLARITIN) 10 MG tablet Take 10 mg by mouth in the morning.     metFORMIN (GLUCOPHAGE) 1000 MG tablet TAKE 2 TABLETS BY MOUTH DAILY 180 tablet 3   omeprazole (PRILOSEC) 40 MG capsule Take 1 capsule (40 mg total) by mouth 2 (two) times daily before a meal. 60 capsule 6   ONETOUCH ULTRA test strip USE AS DIRECTED TO TEST BLOOD GLUCOSE DAILY 100 strip 2   tirzepatide (MOUNJARO) 7.5 MG/0.5ML Pen Inject 7.5 mg into the skin once a week. 6 mL 1   No current facility-administered medications for this visit.    Allergies Allergies  Allergen Reactions   Ibuprofen Swelling    Made his joints swell with high doses    Histories Past Medical History:  Diagnosis Date   Anemia    Arthritis    Cancer (HCC) 2013   stg II colon cancer s/p resection   Colonic mass    Diabetes mellitus    FAP (familial adenomatous polyposis)    Heart murmur    Hyperlipidemia    Hypertension    not on blood pressure meds since 11/13    Hypothyroidism    Nasal congestion    Past Surgical History:  Procedure Laterality Date   BIOPSY  01/15/2018   Procedure: BIOPSY;  Surgeon: [name], MD;  [facility]: [facility];  Service: Endoscopy;;   BIOPSY  07/10/2019   Procedure: BIOPSY;  Surgeon: [name], MD;  [facility];  Service: General;;   BIOPSY  01/27/2021   Procedure: BIOPSY;  Surgeon: [name], MD;  [facility];  Service: Endoscopy;;   BIOPSY  05/10/2022   Procedure: BIOPSY;  Surgeon: Lemar Lofty., MD;  [facility];  Service: Gastroenterology;;   BIOPSY  08/02/2022   Procedure: BIOPSY;  Surgeon: Lemar Lofty., MD;  [facility];  Service: Gastroenterology;;   BIOPSY  12/17/2022   Procedure: BIOPSY;  Surgeon: Lemar Lofty., MD;  [facility];  Service: Gastroenterology;;   BOWEL RESECTION  05/20/2012   COLON RESECTION  05/20/2012   Procedure: COLON RESECTION LAPAROSCOPIC;  Surgeon: Ardeth Sportsman, MD;  [facility];  Service: General;  Laterality: N/A;  Laparoscopic Proctocolectomy, Ileal Pouch Anal Anastomoisis, Loop Ileostomy   ENDOSCOPIC MUCOSAL RESECTION  08/02/2022   Procedure: ENDOSCOPIC MUCOSAL RESECTION;  Surgeon: Lemar Lofty., MD;  [facility];  Service: Gastroenterology;;   ESOPHAGOGASTRODUODENOSCOPY (EGD) WITH PROPOFOL N/A 04/23/2013   Procedure: ESOPHAGOGASTRODUODENOSCOPY (EGD) WITH PROPOFOL;  Surgeon: Rachael Fee, MD;  [facility];  Service: Endoscopy;  Laterality: N/A;  side viewing scope   ESOPHAGOGASTRODUODENOSCOPY (EGD) WITH PROPOFOL N/A 12/17/2022   Procedure: ESOPHAGOGASTRODUODENOSCOPY (EGD) WITH PROPOFOL;  Surgeon: Meridee Score Netty Starring., MD;  Location: [facility];  Service: Gastroenterology;  Laterality: N/A;   FLEXIBLE SIGMOIDOSCOPY  08/01/2012   Procedure: FLEXIBLE SIGMOIDOSCOPY;  Surgeon: [name], MD;  [facility];  Service: Endoscopy;  Laterality: N/A;  use endoscope   FLEXIBLE SIGMOIDOSCOPY N/A 11/29/2014   Procedure: FLEXIBLE SIGMOIDOSCOPY/POUCHOSCOPY;  Surgeon:  [name], MD;  Location: [facility];  Service: Endoscopy;  Laterality: N/A;   FLEXIBLE SIGMOIDOSCOPY N/A 05/10/2022   Procedure: FLEXIBLE SIGMOIDOSCOPY;  Surgeon: Meridee Score Netty Starring., MD;  Location: [facility];  Service: Gastroenterology;  Laterality: N/A;   FLEXIBLE SIGMOIDOSCOPY N/A 12/17/2022   Procedure: FLEXIBLE SIGMOIDOSCOPY;  Surgeon: Meridee Score Netty Starring., MD;  Location: [facility];  Service: Gastroenterology;  Laterality: N/A;   FOREIGN BODY REMOVAL  08/02/2022   Procedure: FOREIGN BODY REMOVAL;  Surgeon: Meridee Score Netty Starring., MD;  Location: [facility];  Service: Gastroenterology;;   ILEOSTOMY  05/20/12   ILEOSTOMY CLOSURE N/A 09/18/2012   Procedure: Loop Ileostomy Takedown with EUA ;  Surgeon: Ardeth Sportsman, MD;  [facility];  Service: General;  Laterality: N/A;  loop ileostomy takedown with EUA    KNEE  ARTHROSCOPY  1998   Right knee   POLYPECTOMY  05/10/2022   Procedure: POLYPECTOMY;  Surgeon: Lemar Lofty., MD;  Location: [facility];  Service: Gastroenterology;;   POLYPECTOMY  08/02/2022   Procedure: POLYPECTOMY;  Surgeon: Lemar Lofty., MD;  Location: [facility];  Service: Gastroenterology;;   POLYPECTOMY  12/17/2022   Procedure: POLYPECTOMY;  Surgeon: Lemar Lofty., MD;  Location: [facility];  Service: Gastroenterology;;   POUCHOSCOPY  11/16/2013   Procedure: POUCHOSCOPY;  Surgeon: [name], MD;  [facility];  Service: Endoscopy;;   POUCHOSCOPY N/A 11/16/2015   Procedure: POUCHOSCOPY;  Surgeon: [name], MD;  [facility];  Service: Endoscopy;  Laterality: N/A;   POUCHOSCOPY N/A 12/20/2016   Procedure: POUCHOSCOPY;  Surgeon: [name], MD;  [facility];  Service: Endoscopy;  Laterality: N/A;   POUCHOSCOPY N/A 01/15/2018   Procedure: POUCHOSCOPY;  Surgeon: [name], MD;  [facility];  Service: Endoscopy;  Laterality: N/A;   POUCHOSCOPY N/A 07/10/2019   Procedure: POUCHOSCOPY;  Surgeon: [name], MD;  Location: [facility];  Service: General;  Laterality: N/A;   POUCHOSCOPY N/A 01/27/2021   Procedure: POUCHOSCOPY;  Surgeon: [name], MD;  [facility];  Service: Endoscopy;  Laterality: N/A;   POUCHOSCOPY N/A 08/02/2022   Procedure: POUCHOSCOPY;  Surgeon: Mansouraty, Netty Starring., MD;  Location: [facility];  Service: Gastroenterology;  Laterality: N/A;   THYROID LOBECTOMY  05/20/2012   Procedure: THYROID LOBECTOMY;  Surgeon: Ardeth Sportsman, MD;  [facility];  Service: General;  Laterality: Left;  LEFT THYROID LOBECTOMY   UPPER GASTROINTESTINAL ENDOSCOPY     WISDOM TOOTH EXTRACTION     Social History   Socioeconomic History   Marital status: Married    Spouse name: June   Number of children: 0   Years of education: Not on file   Highest education level: Not on  file  Occupational History   Occupation: bus driver    Employer: [employer]    Comment: 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, 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  Tobacco Use   Smoking status: Former    Current packs/day: 0.00    Types: Cigarettes    Quit date: 08/07/1991    Years since quitting: 31.9   Smokeless tobacco: Never   Tobacco comments:    quit in 1993  Vaping Use   Vaping status: Never Used  Substance and Sexual Activity   Alcohol use: No   Drug use: No   Sexual activity: Not on file  Other Topics Concern   Not on file  Social History Narrative   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[employer])   [employer]  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   Married 1996   No kids   Social Determinants of Health   Financial Resource Strain: Not on file  Food Insecurity: Not on file  Transportation Needs: Not on file  Physical Activity: Not on file  Stress: Not on file  Social Connections: Not on file  Intimate Partner Violence: Not on file   Family History  Problem Relation Age of Onset   Uterine cancer Mother    Hypertension Mother    Stroke Mother    Colon cancer Mother    Colon polyps Mother    Cancer Mother        colon, endometrial   Diabetes Father    Obesity Father    Pneumonia Father    Cancer Father        skin - squamous    Breast cancer Sister    Diabetes Paternal Grandmother    Prostate cancer Neg Hx    Esophageal cancer Neg Hx    Inflammatory bowel disease Neg Hx    Liver disease Neg Hx    Pancreatic cancer Neg Hx    Rectal cancer Neg Hx    I have reviewed his medical, social, and family history in detail and updated the electronic medical record as necessary.    PHYSICAL EXAMINATION  BP 124/60 (BP Location: Left Arm, Patient Position: Sitting, Cuff Size: Large)   Pulse 92   Ht 5' 7.5" (1.715 m) Comment: height measured without shoes  Wt 226 lb 6 oz (102.7 kg)   BMI 34.93 kg/m  Wt Readings from Last 3 Encounters:  06/26/23 226 lb 6 oz (102.7 kg)   06/17/23 225 lb 3.2 oz (102.2 kg)  05/24/23 221 lb 12.8 oz (100.6 kg)  GEN: NAD, appears stated age, doesn't appear chronically ill PSYCH: Cooperative, without pressured speech EYE: Conjunctivae pink, sclerae anicteric ENT: MMM CV: Nontachycardic RESP: No audible wheezing GI: NABS, soft, protuberant abdomen, rounded, surgical scars present, without rebound or guarding MSK/EXT: No significant lower extremity edema SKIN: No jaundice NEURO:  Alert & Oriented x 3, no focal deficits   REVIEW OF DATA  I reviewed the following data at the time of this encounter:  GI Procedures and Studies  May 2024 Pouchoscopy - Hemorrhoids and rectal stricture found on digital rectal exam. - The examined portion of the neoterminal ileum was normal. - > than 70 ileal polyps, removed with a cold snare. Resected and retrieved. - Stricture at the anus traversed with therapeutic endoscope and digital dilation performed as well.  May 2024 EGD - No gross lesions in the proximal esophagus and in the mid esophagus. - LA Grade B esophagitis with no bleeding distally. - Z-line irregular, 40 cm from the incisors. - Nodule found at the GE junction just below the esophagitis. Biopsied. - 2 cm hiatal hernia. - Erythematous mucosa in the stomach. Biopsied. - Duodenal scar just past D1/D2 angle. Appearance of adenomatous tissue resected. - Normal major papilla. - No other gross lesions in the duodenal bulb, in the first portion of the duodenum and in the second portion of the duodenum.  Pathology FINAL MICROSCOPIC DIAGNOSIS:  A. STOMACH, BIOPSY:  - Mild reactive gastropathy.  -Negative for H. pylori on HE stain  - No intestinal metaplasia, dysplasia, or malignancy.  B. GE JUNCTION NODULE, BIOPSY:  - Polypoid gastroesophageal junctional mucosa with nonspecific  acute/chronic inflammation, suggestive of inflammatory polyp  - Negative for intestinal metaplasia, dysplasia or malignancy  C. DUODENUM, BIOPSY:  - Benign small  bowel mucosa with mild nonspecific reactive changes  D. POUCH, POLYPECTOMY:  - Tubular adenoma.  - No high grade dysplasia or malignancy.   Laboratory Studies  Reviewed those in epic  Imaging Studies  No relevant studies to review   ASSESSMENT  Mr. Rymer is a 67 y.o. male with a pmh significant for Colon cancer (status post previous resection with finding of FAP), status post pouch with recurrent adenomas, esophagitis, family history colon cancer (mother), hyperlipidemia, diabetes.  The patient is seen today for evaluation and management of:  1. Familial adenomatous polyposis coli, s/p proctocolectomy/J pouch 05/20/2012   2. Hx of adenomatous colonic polyps   3. Esophagitis   4. History of colon cancer    The patient is hemodynamically and clinically stable at this time.  With this being said, he is due for [place] to reevaluate his pouch in the setting of his underlying FAP with recurring tubular adenomas.  We have been able to significantly decrease his burden over the course of the last year and a half but my hope is that we can continue to keep his burning low, knowing that he will always be at risk of having polyp development.  There have previously been a larger polyp towards the anorectum transition that I had concerns could be developing into dysplasia, but thankfully scar tissue look to be more present at that last pouchoscopy evaluation.  Time will tell but he would like to hold on having a pouch redo or ostomy and so we will do what we can in an effort of trying to prevent him from needing surgery if possible.  Although he has no symptoms of overt pyrosis he did have grade B esophagitis and he is now on once daily dosing of PPI and nephro trying to keep things healed.  Will plan to repeat his upper endoscopy at the time of his pouchoscopy.  Then if no significant issues, then we can push his endoscopy back to every year.  The risks and benefits of endoscopic evaluation were discussed with  the patient; these include but are not limited to the risk of perforation, infection, bleeding, missed lesions, lack of diagnosis, severe illness requiring hospitalization, as well as anesthesia and sedation related illnesses.  The patient and/or family is agreeable to proceed.  All patient questions were answered to the best of my ability, and the patient agrees to the aforementioned plan of action with follow-up as indicated.   PLAN  Proceed with scheduling EGD/pouchoscopy - Hold Mounjaro for 1 week before Pending results will discuss/decide if we can go back to yearly EGD Pending overall polyp burden will consider if we can transition him back to yearly pouchoscopy versus continuing 80-month pouchoscopy's Continue omeprazole 40 mg once daily   Orders Placed This Encounter  Procedures   Procedural/ Surgical Case Request: ESOPHAGOGASTRODUODENOSCOPY (EGD) WITH PROPOFOL, FLEXIBLE SIGMOIDOSCOPY   Ambulatory referral to Gastroenterology    New Prescriptions   No medications on file   Modified Medications   No medications on file    Planned Follow Up No follow-ups on file.   Total Time in Face-to-Face and in Coordination of Care for patient including independent/personal interpretation/review of prior testing, medical history, examination, medication adjustment, communicating results with the patient directly, and documentation within the EHR is 25 minutes.   Corliss Parish, MD [facility] Advanced Endoscopy Office # [phone]

## 2023-06-26 NOTE — Patient Instructions (Addendum)
You have been scheduled for an endoscopy. Please follow written instructions given to you at your visit today.  If you use inhalers (even only as needed), please bring them with you on the day of your procedure.  If you take any of the following medications, they will need to be adjusted prior to your procedure:   DO NOT TAKE 7 DAYS PRIOR TO TEST- Trulicity (dulaglutide) Ozempic, Wegovy (semaglutide) Mounjaro (tirzepatide) Bydureon Bcise (exanatide extended release)  DO NOT TAKE 1 DAY PRIOR TO YOUR TEST Rybelsus (semaglutide) Adlyxin (lixisenatide) Victoza (liraglutide) Byetta (exanatide) ___________________________________________________________________________    Due to recent changes in healthcare laws, you may see the results of your imaging and laboratory studies on MyChart before your provider has had a chance to review them.  We understand that in some cases there may be results that are confusing or concerning to you. Not all laboratory results come back in the same time frame and the provider may be waiting for multiple results in order to interpret others.  Please give [place] 48 hours in order for your provider to thoroughly review all the results before contacting the office for clarification of your results.   _______________________________________________________  If your blood pressure at your visit was 140/90 or greater, please contact your primary care physician to follow up on this.  _______________________________________________________  If you are age 66 or older, your body mass index should be between 23-30. Your Body mass index is 34.93 kg/m. If this is out of the aforementioned range listed, please consider follow up with your Primary Care Provider.  If you are age 66 or younger, your body mass index should be between 19-25. Your Body mass index is 34.93 kg/m. If this is out of the aformentioned range listed, please consider follow up with your Primary Care  Provider.   ________________________________________________________  The [facility] providers would like to encourage you to use [facility] to communicate with providers for non-urgent requests or questions.  Due to long hold times on the telephone, sending your provider a message by [facility] may be a faster and more efficient way to get a response.  Please allow 48 business hours for a response.  Please remember that this is for non-urgent requests.  _______________________________________________________  Thank you for choosing me and [facility].  Dr. Meridee Score

## 2023-06-27 ENCOUNTER — Other Ambulatory Visit: Payer: Self-pay | Admitting: Family Medicine

## 2023-08-01 ENCOUNTER — Ambulatory Visit: Payer: BC Managed Care – PPO | Admitting: Gastroenterology

## 2023-08-02 ENCOUNTER — Telehealth: Payer: Self-pay

## 2023-08-02 NOTE — Telephone Encounter (Signed)
 Pt needs a PA for [employer]

## 2023-08-05 ENCOUNTER — Telehealth: Payer: Self-pay

## 2023-08-05 ENCOUNTER — Other Ambulatory Visit: Payer: Self-pay | Admitting: Family Medicine

## 2023-08-05 ENCOUNTER — Other Ambulatory Visit (HOSPITAL_COMMUNITY): Payer: Self-pay

## 2023-08-05 NOTE — Telephone Encounter (Signed)
Pharmacy Patient Advocate Encounter   Received notification from Pt Calls Messages that prior authorization for Cory Harper is required/requested.   Insurance verification completed.   The patient is insured through [name] .   Per test claim: PA required; PA submitted to above mentioned insurance via CoverMyMeds Key/confirmation #/EOC [id number] Status is pending

## 2023-08-12 NOTE — Telephone Encounter (Signed)
 Pharmacy Patient Advocate Encounter  Received notification from [facility] that Prior Authorization for Cory Harper has been APPROVED through 08/03/26   PA #/Case ID/Reference #: [id number]

## 2023-09-02 ENCOUNTER — Other Ambulatory Visit: Payer: Self-pay | Admitting: Family Medicine

## 2023-09-17 ENCOUNTER — Other Ambulatory Visit: Payer: Self-pay | Admitting: Family Medicine

## 2023-09-23 ENCOUNTER — Encounter (HOSPITAL_COMMUNITY): Payer: Self-pay | Admitting: Gastroenterology

## 2023-09-24 ENCOUNTER — Telehealth: Payer: Self-pay | Admitting: Gastroenterology

## 2023-09-24 NOTE — Telephone Encounter (Signed)
 Procedure: EGD/Flex Procedure date:  09/30/23 Procedure location: [facility] Arrival Time: 6:30 am Spoke with the patient Y/N:  Yes 09/25/23 Any prep concerns? No  Has the patient obtained the prep from the pharmacy? Do you have a care partner and transportation: Yes Any additional concerns? No   I left a detailed message for the patient to return call  09/24/23 @ 3:57 pm

## 2023-09-26 ENCOUNTER — Ambulatory Visit: Payer: BC Managed Care – PPO | Admitting: Internal Medicine

## 2023-09-28 NOTE — Anesthesia Preprocedure Evaluation (Signed)
 Anesthesia Evaluation  Patient identified by MRN, date of birth, ID band Patient awake    Reviewed: Allergy & Precautions, H&P , NPO status , Patient's Chart, lab work & pertinent test results  Airway Mallampati: III  TM Distance: >3 FB Neck ROM: Full    Dental no notable dental hx. (+) Teeth Intact, Dental Advisory Given   Pulmonary former smoker   Pulmonary exam normal breath sounds clear to auscultation       Cardiovascular hypertension (no home meds for past 10 years, BP 142/69 preop), Normal cardiovascular exam Rhythm:Regular Rate:Normal     Neuro/Psych negative neurological ROS  negative psych ROS   GI/Hepatic Neg liver ROS,GERD  Medicated and Controlled,,Hx CRC   Endo/Other  diabetes, Well Controlled, Type 2, Oral Hypoglycemic AgentsHypothyroidism  Obesity BMI 34  Renal/GU negative Renal ROS  negative genitourinary   Musculoskeletal  (+) Arthritis , Osteoarthritis,    Abdominal  (+) + obese  Peds negative pediatric ROS (+)  Hematology negative hematology ROS (+)   Anesthesia Other Findings Mounjaro LD: 09/21/23  Reproductive/Obstetrics negative OB ROS                             Anesthesia Physical Anesthesia Plan  ASA: 3  Anesthesia Plan: MAC   Post-op Pain Management:    Induction:   PONV Risk Score and Plan: 2 and Propofol infusion and TIVA  Airway Management Planned: Natural Airway and Simple Face Mask  Additional Equipment: None  Intra-op Plan:   Post-operative Plan:   Informed Consent: I have reviewed the patients History and Physical, chart, labs and discussed the procedure including the risks, benefits and alternatives for the proposed anesthesia with the patient or authorized representative who has indicated his/her understanding and acceptance.       Plan Discussed with: CRNA  Anesthesia Plan Comments:        Anesthesia Quick Evaluation

## 2023-09-30 ENCOUNTER — Other Ambulatory Visit: Payer: Self-pay

## 2023-09-30 ENCOUNTER — Ambulatory Visit (HOSPITAL_BASED_OUTPATIENT_CLINIC_OR_DEPARTMENT_OTHER): Payer: Self-pay | Admitting: Anesthesiology

## 2023-09-30 ENCOUNTER — Ambulatory Visit (HOSPITAL_COMMUNITY)
Admission: RE | Admit: 2023-09-30 | Discharge: 2023-09-30 | Disposition: A | Discharge status: Home / Self Care | Payer: 59 | Attending: Gastroenterology | Admitting: Gastroenterology

## 2023-09-30 ENCOUNTER — Encounter (HOSPITAL_COMMUNITY)
Admission: RE | Disposition: A | Discharge status: Home / Self Care | Payer: Self-pay | Source: Home / Self Care | Attending: Gastroenterology

## 2023-09-30 ENCOUNTER — Encounter (HOSPITAL_COMMUNITY): Payer: Self-pay | Admitting: Gastroenterology

## 2023-09-30 ENCOUNTER — Ambulatory Visit (HOSPITAL_COMMUNITY): Payer: Self-pay | Admitting: Anesthesiology

## 2023-09-30 DIAGNOSIS — E039 Hypothyroidism, unspecified: Secondary | ICD-10-CM | POA: Diagnosis not present

## 2023-09-30 DIAGNOSIS — K219 Gastro-esophageal reflux disease without esophagitis: Secondary | ICD-10-CM | POA: Diagnosis not present

## 2023-09-30 DIAGNOSIS — D1391 Familial adenomatous polyposis: Secondary | ICD-10-CM

## 2023-09-30 DIAGNOSIS — E669 Obesity, unspecified: Secondary | ICD-10-CM | POA: Insufficient documentation

## 2023-09-30 DIAGNOSIS — K449 Diaphragmatic hernia without obstruction or gangrene: Secondary | ICD-10-CM | POA: Insufficient documentation

## 2023-09-30 DIAGNOSIS — K6289 Other specified diseases of anus and rectum: Secondary | ICD-10-CM

## 2023-09-30 DIAGNOSIS — K209 Esophagitis, unspecified without bleeding: Secondary | ICD-10-CM | POA: Diagnosis not present

## 2023-09-30 DIAGNOSIS — D128 Benign neoplasm of rectum: Secondary | ICD-10-CM | POA: Insufficient documentation

## 2023-09-30 DIAGNOSIS — Z87891 Personal history of nicotine dependence: Secondary | ICD-10-CM | POA: Insufficient documentation

## 2023-09-30 DIAGNOSIS — Z7984 Long term (current) use of oral hypoglycemic drugs: Secondary | ICD-10-CM | POA: Insufficient documentation

## 2023-09-30 DIAGNOSIS — Z79899 Other long term (current) drug therapy: Secondary | ICD-10-CM | POA: Insufficient documentation

## 2023-09-30 DIAGNOSIS — Z1509 Genetic susceptibility to other malignant neoplasm: Secondary | ICD-10-CM | POA: Insufficient documentation

## 2023-09-30 DIAGNOSIS — T185XXA Foreign body in anus and rectum, initial encounter: Secondary | ICD-10-CM

## 2023-09-30 DIAGNOSIS — Z800 Family history of malignant neoplasm of digestive organs: Secondary | ICD-10-CM | POA: Insufficient documentation

## 2023-09-30 DIAGNOSIS — K2282 Esophagogastric junction polyp: Secondary | ICD-10-CM | POA: Diagnosis not present

## 2023-09-30 DIAGNOSIS — Z9049 Acquired absence of other specified parts of digestive tract: Secondary | ICD-10-CM | POA: Diagnosis not present

## 2023-09-30 DIAGNOSIS — D132 Benign neoplasm of duodenum: Secondary | ICD-10-CM | POA: Insufficient documentation

## 2023-09-30 DIAGNOSIS — Z83719 Family history of colon polyps, unspecified: Secondary | ICD-10-CM | POA: Insufficient documentation

## 2023-09-30 DIAGNOSIS — T183XXA Foreign body in small intestine, initial encounter: Secondary | ICD-10-CM | POA: Diagnosis not present

## 2023-09-30 DIAGNOSIS — K317 Polyp of stomach and duodenum: Secondary | ICD-10-CM | POA: Insufficient documentation

## 2023-09-30 DIAGNOSIS — Z860101 Personal history of adenomatous and serrated colon polyps: Secondary | ICD-10-CM

## 2023-09-30 DIAGNOSIS — K3189 Other diseases of stomach and duodenum: Secondary | ICD-10-CM

## 2023-09-30 DIAGNOSIS — Z833 Family history of diabetes mellitus: Secondary | ICD-10-CM | POA: Insufficient documentation

## 2023-09-30 DIAGNOSIS — E119 Type 2 diabetes mellitus without complications: Secondary | ICD-10-CM | POA: Diagnosis not present

## 2023-09-30 DIAGNOSIS — K624 Stenosis of anus and rectum: Secondary | ICD-10-CM

## 2023-09-30 DIAGNOSIS — Z980 Intestinal bypass and anastomosis status: Secondary | ICD-10-CM | POA: Diagnosis not present

## 2023-09-30 DIAGNOSIS — I10 Essential (primary) hypertension: Secondary | ICD-10-CM | POA: Insufficient documentation

## 2023-09-30 DIAGNOSIS — Z85038 Personal history of other malignant neoplasm of large intestine: Secondary | ICD-10-CM

## 2023-09-30 DIAGNOSIS — Z6833 Body mass index (BMI) 33.0-33.9, adult: Secondary | ICD-10-CM | POA: Diagnosis not present

## 2023-09-30 DIAGNOSIS — K2289 Other specified disease of esophagus: Secondary | ICD-10-CM | POA: Diagnosis not present

## 2023-09-30 HISTORY — PX: ESOPHAGOGASTRODUODENOSCOPY (EGD) WITH PROPOFOL: SHX5813

## 2023-09-30 HISTORY — PX: POLYPECTOMY: SHX5525

## 2023-09-30 HISTORY — PX: FOREIGN BODY REMOVAL: SHX962

## 2023-09-30 HISTORY — PX: FLEXIBLE SIGMOIDOSCOPY: SHX5431

## 2023-09-30 LAB — GLUCOSE, CAPILLARY: Glucose-Capillary: 154 mg/dL — ABNORMAL HIGH (ref 70–99)

## 2023-09-30 SURGERY — ESOPHAGOGASTRODUODENOSCOPY (EGD) WITH PROPOFOL
Anesthesia: Monitor Anesthesia Care

## 2023-09-30 MED ORDER — SODIUM CHLORIDE 0.9 % IV SOLN: INTRAVENOUS | Status: DC

## 2023-09-30 MED ORDER — LIDOCAINE 2% (20 MG/ML) 5 ML SYRINGE
INTRAMUSCULAR | Status: DC | PRN
  Administered 2023-09-30: 100 mg via INTRAVENOUS

## 2023-09-30 MED ORDER — PROPOFOL 1000 MG/100ML IV EMUL
INTRAVENOUS | Status: AC
  Filled 2023-09-30: qty 100

## 2023-09-30 MED ORDER — CIPROFLOXACIN IN D5W 400 MG/200ML IV SOLN
INTRAVENOUS | Status: DC | PRN
  Administered 2023-09-30: 400 mg via INTRAVENOUS

## 2023-09-30 MED ORDER — CIPROFLOXACIN IN D5W 400 MG/200ML IV SOLN
INTRAVENOUS | Status: AC
Start: 2023-09-30 — End: ?
  Filled 2023-09-30: qty 200

## 2023-09-30 MED ORDER — PROPOFOL 500 MG/50ML IV EMUL
INTRAVENOUS | Status: DC | PRN
  Administered 2023-09-30: 100 mg via INTRAVENOUS
  Administered 2023-09-30: 100 ug/kg/min via INTRAVENOUS
  Administered 2023-09-30 (×2): 50 mg via INTRAVENOUS
  Administered 2023-09-30 (×2): 30 mg via INTRAVENOUS
  Administered 2023-09-30: 100 mg via INTRAVENOUS

## 2023-09-30 MED ORDER — PROPOFOL 500 MG/50ML IV EMUL
INTRAVENOUS | Status: AC
  Filled 2023-09-30: qty 50

## 2023-09-30 SURGICAL SUPPLY — 14 items

## 2023-09-30 NOTE — Op Note (Signed)
 [facility] Patient Name: Cory Harper Procedure Date: 09/30/2023 MRN: [id number] Attending MD: Corliss Parish , MD, [id number] Date of Birth: [birth year] CSN: [id number] Age: 68 Admit Type: Outpatient Procedure:                Upper GI endoscopy Indications:              Follow-up of esophagitis, Familial Adenomatous                            Polyposis Providers:                Corliss Parish, MD, Fransisca Connors, Kandice Robinsons, Technician Referring MD:              Medicines:                Monitored Anesthesia Care Complications:            No immediate complications. Estimated Blood Loss:     Estimated blood loss was minimal. Procedure:                Pre-Anesthesia Assessment:                           - Prior to the procedure, a History and Physical                            was performed, and patient medications and                            allergies were reviewed. The patient's tolerance of                            previous anesthesia was also reviewed. The risks                            and benefits of the procedure and the sedation                            options and risks were discussed with the patient.                            All questions were answered, and informed consent                            was obtained. Prior Anticoagulants: The patient has                            taken no anticoagulant or antiplatelet agents                            except for aspirin. ASA Grade Assessment: III - A                            patient with severe  systemic disease. After                            reviewing the risks and benefits, the patient was                            deemed in satisfactory condition to undergo the                            procedure.                           After obtaining informed consent, the endoscope was                            passed under direct vision. Throughout the                             procedure, the patient's blood pressure, pulse, and                            oxygen saturations were monitored continuously. The                            [id number]-[id number] ([id number] Olympus therapeutic endoscope                            was introduced through the mouth, and advanced to                            the second part of duodenum. The upper GI endoscopy                            was accomplished without difficulty. The patient                            tolerated the procedure. Scope In: Scope Out: Findings:      No gross lesions were noted in the entire esophagus.      The Z-line was irregular and was found 42 cm from the incisors.      A 1 cm hiatal hernia was present.      A single 12 mm semi-sessile polyp with no bleeding and no stigmata of       recent bleeding was found just below the gastroesophageal junction -       consistent with previously biopsied inflammatory polyp (unchanged from       prior).      Multiple small sessile polyps were found in the gastric fundus and in       the gastric body - consistent with fundic gland polyps.      Patchy mildly erythematous mucosa without bleeding was found in the       entire examined stomach (previously biopsied so not rebiopsied).      Four 6 to 10 mm sessile polyps were found in the first portion of the       duodenum and in the second portion of the duodenum. These  polyps were       removed with a cold snare. Resection and retrieval were complete.      The major papilla was normal with long tail as noted prior. Impression:               - No gross lesions in the entire esophagus. Z-line                            irregular, 42 cm from the incisors.                           - 1 cm hiatal hernia.                           - A single gastroesophageal junction polyp -                            consistent with inflammatory polyp (previously                            biopsied).                            - Multiple gastric polyps - fundic gland in                            appearance.                           - Erythematous mucosa in the stomach.                           - Four duodenal polyps. Resected and retrieved.                           - Normal major papilla with long tail noted as                            prior. Moderate Sedation:      Not Applicable - Patient had care per Anesthesia. Recommendation:           - Proceed to scheduled pouchoscopy.                           - Observe patient's clinical course.                           - Continue present medications otherwise.                           - Should patient develop evidence of iron                            deficiency in the future, or dysphagia symptoms,                            would consider removal of inflammatory polyp.                           -  Await pathology results.                           - Repeat upper endoscopy for surveillance.                           - Repeat upper endoscopy in 1 year most likely to                            be recommended. Will await final pathology.                           - The findings and recommendations were discussed                            with the patient.                           - The findings and recommendations were discussed                            with the patient's family. Procedure Code(s):        --- Professional ---                           [phone], Esophagogastroduodenoscopy, flexible,                            transoral; with removal of tumor(s), polyp(s), or                            other lesion(s) by snare technique Diagnosis Code(s):        --- Professional ---                           K22.89, Other specified disease of esophagus                           K44.9, Diaphragmatic hernia without obstruction or                            gangrene                           K22.82, Esophagogastric junction polyp                            K31.7, Polyp of stomach and duodenum                           K31.89, Other diseases of stomach and duodenum                           K20.90, Esophagitis, unspecified without bleeding                           D12.6, Benign neoplasm of colon,  unspecified                           Z15.09, Genetic susceptibility to other malignant                            neoplasm CPT copyright 2022 [employer]. All rights reserved. The codes documented in this report are preliminary and upon coder review may  be revised to meet current compliance requirements. Corliss Parish, MD 09/30/2023 9:30:16 AM Number of Addenda: 0

## 2023-09-30 NOTE — Op Note (Signed)
 [facility] Patient Name: Cory Harper Procedure Date: 09/30/2023 MRN: [id number] Attending MD: Corliss Parish , MD, [id number] Date of Birth: 09-17-1955 CSN: [id number] Age: 68 Admit Type: Outpatient Procedure:                Pouchoscopy Indications:              History of total colectomy, Personal history of                            malignant neoplasm of the colon, History of                            familial polyposis Providers:                Corliss Parish, MD, Fransisca Connors, Kandice Robinsons, Technician Referring MD:              Medicines:                Monitored Anesthesia Care, Cipro 400 mg IV (given                            due to prior history of perianal abscess) Complications:            No immediate complications. Estimated Blood Loss:     Estimated blood loss was minimal. Procedure:                Pre-Anesthesia Assessment:                           - Prior to the procedure, a History and Physical                            was performed, and patient medications and                            allergies were reviewed. The patient's tolerance of                            previous anesthesia was also reviewed. The risks                            and benefits of the procedure and the sedation                            options and risks were discussed with the patient.                            All questions were answered, and informed consent                            was obtained. Prior Anticoagulants: The patient has  taken no anticoagulant or antiplatelet agents                            except for aspirin. ASA Grade Assessment: III - A                            patient with severe systemic disease. After                            reviewing the risks and benefits, the patient was                            deemed in satisfactory condition to undergo the                             procedure.                           After obtaining informed consent, the endoscope was                            passed under direct vision. Throughout the                            procedure, the patient's blood pressure, pulse, and                            oxygen saturations were monitored continuously. The                            [id number]-[id number] ([id number]) Olympus therapeutic endoscope                            was introduced through the anus and advanced to the                            the neo-terminal ileum. After obtaining informed                            consent, the endoscope was passed under direct                            vision. Throughout the procedure, the patient's                            blood pressure, pulse, and oxygen saturations were                            monitored continuously.The procedure was performed                            without difficulty. The patient tolerated the  procedure. The quality of the bowel preparation was                            adequate. Scope In: 8:27:53 AM Scope Out: 9:12:43 AM Total Procedure Duration: 0 hours 44 minutes 50 seconds  Findings:      The digital rectal exam findings include anal stenosis (approximately 12       mm - unchanged from prior exam). Pertinent negatives include no palpable       rectal lesions.      A large amount of semi-liquid semi-solid stool was found in the ileal       pouch, making visualization difficult. Lavage of the area was performed       using copious amounts, resulting in clearance with adequate       visualization.      The neo-terminal ileum appeared normal.      The ileoanal pouch contained a suture. Removal of the suture was       accomplished with a regular forceps.      The ileoanal pouch contained greater than 50 sessile, non-bleeding       polyps. 40 of the polyps were 2 to 10 mm in diameter. These polyps were       removed with a cold snare.  Resection and retrieval were complete.      There was a rectal cuff was characterized by healthy appearing mucosa -       previous polypoid tissue not apparent today. Impression:               - Anal stenosis found on digital rectal exam -                            unchanged from prior exam.                           - Stool. Lavaged with adequate visualization                           - The examined portion of the ileum was normal.                           - Presence of ileal foreign body. Removal was                            successful.                           - Greater than 50 polyps in the ileoanal pouch,                            removed with a cold snare. Resected and retrieved.                           - Rectal cuff with healthy appearing mucosa seen -                            previous polypoid tissue not present. Recommendation:           -  The patient will be observed post-procedure,                            until all discharge criteria are met.                           - Discharge patient to home.                           - Patient has a contact number available for                            emergencies. The signs and symptoms of potential                            delayed complications were discussed with the                            patient. Return to normal activities tomorrow.                            Written discharge instructions were provided to the                            patient.                           - Resume previous diet.                           - Await pathology results.                           - Repeat post-surgical lower GI endoscopy in 6-12                            months for surveillance.                           - The findings and recommendations were discussed                            with the patient.                           - The findings and recommendations were discussed                            with the patient's  family. Procedure Code(s):        --- Professional ---                           [phone], Endoscopic evaluation of small intestinal                            pouch (eg, Kock pouch, ileal reservoir [S or J]);  diagnostic, including collection of specimen(s) by                            brushing or washing, when performed (separate                            procedure)                           44799, Unlisted procedure, small intestine Diagnosis Code(s):        --- Professional ---                           K62.4, Stenosis of anus and rectum                           D13.39, Benign neoplasm of other parts of small                            intestine                           Z98.0, Intestinal bypass and anastomosis status                           Z90.49, Acquired absence of other specified parts                            of digestive tract                           Z85.038, Personal history of other malignant                            neoplasm of large intestine                           Z86.010, Personal history of colonic polyps CPT copyright 2022 American [employer] Association. All rights reserved. The codes documented in this report are preliminary and upon coder review may  be revised to meet current compliance requirements. Corliss Parish, MD 09/30/2023 9:38:03 AM Number of Addenda: 0

## 2023-09-30 NOTE — Transfer of Care (Signed)
 Immediate Anesthesia Transfer of Care Note  Patient: Cory Harper  Procedure(s) Performed: ESOPHAGOGASTRODUODENOSCOPY (EGD) WITH PROPOFOL FLEXIBLE SIGMOIDOSCOPY POLYPECTOMY FOREIGN BODY REMOVAL  Patient Location: PACU and Endoscopy Unit  Anesthesia Type:MAC  Level of Consciousness: drowsy  Airway & Oxygen Therapy: Patient Spontanous Breathing and Patient connected to face mask oxygen  Post-op Assessment: Report given to RN and Post -op Vital signs reviewed and stable  Post vital signs: Reviewed and stable  Last Vitals:  Vitals Value Taken Time  BP 101/56 09/30/23 0920  Temp    Pulse 78 09/30/23 0922  Resp 0 09/30/23 0921  SpO2 100 % 09/30/23 0922  Vitals shown include unfiled device data.  Last Pain:  Vitals:   09/30/23 0651  TempSrc: Tympanic  PainSc: 0-No pain         Complications: No notable events documented.

## 2023-09-30 NOTE — Discharge Instructions (Signed)

## 2023-09-30 NOTE — Anesthesia Procedure Notes (Signed)
 Procedure Name: MAC Date/Time: 09/30/2023 8:00 AM  Performed by: Maurene Capes, CRNAPre-anesthesia Checklist: Patient identified, Emergency Drugs available, Suction available, Patient being monitored and Timeout performed Patient Re-evaluated:Patient Re-evaluated prior to induction Oxygen Delivery Method: Simple face mask Preoxygenation: Pre-oxygenation with 100% oxygen Induction Type: IV induction Placement Confirmation: positive ETCO2 Dental Injury: Teeth and Oropharynx as per pre-operative assessment

## 2023-09-30 NOTE — Anesthesia Postprocedure Evaluation (Signed)
 Anesthesia Post Note  Patient: ALVARO AUNGST  Procedure(s) Performed: ESOPHAGOGASTRODUODENOSCOPY (EGD) WITH PROPOFOL FLEXIBLE SIGMOIDOSCOPY POLYPECTOMY FOREIGN BODY REMOVAL     Patient location during evaluation: PACU Anesthesia Type: MAC Level of consciousness: awake and alert Pain management: pain level controlled Vital Signs Assessment: post-procedure vital signs reviewed and stable Respiratory status: spontaneous breathing, nonlabored ventilation and respiratory function stable Cardiovascular status: blood pressure returned to baseline and stable Postop Assessment: no apparent nausea or vomiting Anesthetic complications: no   No notable events documented.  Last Vitals:  Vitals:   09/30/23 0940 09/30/23 0950  BP: (!) 121/56 (!) 119/59  Pulse: 78 84  Resp: 15 18  Temp:    SpO2: 94% 96%    Last Pain:  Vitals:   09/30/23 0950  TempSrc:   PainSc: 0-No pain                 Lannie Fields

## 2023-09-30 NOTE — H&P (Signed)
 GASTROENTEROLOGY PROCEDURE H&P NOTE   Primary Care Physician: [name], MD  HPI: Cory Harper is a 68 y.o. male who presents for EGD/Pouchoscopy for evaluation of FAP and underlying dysplastic polyps.  Past Medical History:  Diagnosis Date   Anemia    Arthritis    Cancer (HCC) 2013   stg II colon cancer s/p resection   Colonic mass    Diabetes mellitus    FAP (familial adenomatous polyposis)    Heart murmur    Hyperlipidemia    Hypertension    not on blood pressure meds since 11/13    Hypothyroidism    Nasal congestion    Past Surgical History:  Procedure Laterality Date   BIOPSY  01/15/2018   Procedure: BIOPSY;  Surgeon: [name], MD;  Location: [facility];  Service: Endoscopy;;   BIOPSY  07/10/2019   Procedure: BIOPSY;  Surgeon: [name], MD;  Location: [facility];  Service: General;;   BIOPSY  01/27/2021   Procedure: BIOPSY;  Surgeon: [name], MD;  Location: [facility];  Service: Endoscopy;;   BIOPSY  05/10/2022   Procedure: BIOPSY;  Surgeon: Lemar Lofty., MD;  Location: [facility];  Service: Gastroenterology;;   BIOPSY  08/02/2022   Procedure: BIOPSY;  Surgeon: Lemar Lofty., MD;  Location: [facility];  Service: Gastroenterology;;   BIOPSY  12/17/2022   Procedure: BIOPSY;  Surgeon: Lemar Lofty., MD;  [facility]: [facility];  Service: Gastroenterology;;   BOWEL RESECTION  05/20/2012   COLON RESECTION  05/20/2012   Procedure: COLON RESECTION LAPAROSCOPIC;  Surgeon: Ardeth Sportsman, MD;  Location: [facility];  Service: General;  Laterality: N/A;  Laparoscopic Proctocolectomy, Ileal Pouch Anal Anastomoisis, Loop Ileostomy   ENDOSCOPIC MUCOSAL RESECTION  08/02/2022   Procedure: ENDOSCOPIC MUCOSAL RESECTION;  Surgeon: Lemar Lofty., MD;  [facility]: [facility];  Service: Gastroenterology;;   ESOPHAGOGASTRODUODENOSCOPY (EGD) WITH PROPOFOL N/A 04/23/2013   Procedure: ESOPHAGOGASTRODUODENOSCOPY (EGD)  WITH PROPOFOL;  Surgeon: Rachael Fee, MD;  [facility]: [facility];  Service: Endoscopy;  Laterality: N/A;  side viewing scope   ESOPHAGOGASTRODUODENOSCOPY (EGD) WITH PROPOFOL N/A 12/17/2022   Procedure: ESOPHAGOGASTRODUODENOSCOPY (EGD) WITH PROPOFOL;  Surgeon: Meridee Score Netty Starring., MD;  Location: [facility];  Service: Gastroenterology;  Laterality: N/A;   FLEXIBLE SIGMOIDOSCOPY  08/01/2012   Procedure: FLEXIBLE SIGMOIDOSCOPY;  Surgeon: [name], MD;  [facility];  Service: Endoscopy;  Laterality: N/A;  use endoscope   FLEXIBLE SIGMOIDOSCOPY N/A 11/29/2014   Procedure: FLEXIBLE SIGMOIDOSCOPY/POUCHOSCOPY;  Surgeon: [name], MD;  [facility];  Service: Endoscopy;  Laterality: N/A;   FLEXIBLE SIGMOIDOSCOPY N/A 05/10/2022   Procedure: FLEXIBLE SIGMOIDOSCOPY;  Surgeon: Meridee Score Netty Starring., MD;  Location: [facility];  Service: Gastroenterology;  Laterality: N/A;   FLEXIBLE SIGMOIDOSCOPY N/A 12/17/2022   Procedure: FLEXIBLE SIGMOIDOSCOPY;  Surgeon: Meridee Score Netty Starring., MD;  Location: [facility];  Service: Gastroenterology;  Laterality: N/A;   FOREIGN BODY REMOVAL  08/02/2022   Procedure: FOREIGN BODY REMOVAL;  Surgeon: Meridee Score Netty Starring., MD;  Location: [facility];  Service: Gastroenterology;;   ILEOSTOMY  05/20/12   ILEOSTOMY CLOSURE N/A 09/18/2012   Procedure: Loop Ileostomy Takedown with EUA ;  Surgeon: Ardeth Sportsman, MD;  Location: [facility];  Service: General;  Laterality: N/A;  loop ileostomy takedown with EUA    KNEE ARTHROSCOPY  1998   Right knee   POLYPECTOMY  05/10/2022   Procedure: POLYPECTOMY;  Surgeon: Lemar Lofty., MD;  [facility]: [facility];  Service: Gastroenterology;;   POLYPECTOMY  08/02/2022   Procedure: POLYPECTOMY;  Surgeon: Lemar Lofty., MD;  Location: [facility];  Service: Gastroenterology;;   POLYPECTOMY  12/17/2022   Procedure: POLYPECTOMY;  Surgeon: Lemar Lofty., MD;  Location: [facility];  Service: Gastroenterology;;   POUCHOSCOPY  11/16/2013   Procedure: POUCHOSCOPY;  Surgeon: [name], MD;  Location: [facility];  Service: Endoscopy;;   POUCHOSCOPY N/A 11/16/2015   Procedure: POUCHOSCOPY;  Surgeon: [name], MD;  [facility]: [facility];  Service: Endoscopy;  Laterality: N/A;   POUCHOSCOPY N/A 12/20/2016   Procedure: POUCHOSCOPY;  Surgeon: [name], MD;  [facility];  Service: Endoscopy;  Laterality: N/A;   POUCHOSCOPY N/A 01/15/2018   Procedure: POUCHOSCOPY;  Surgeon: [name], MD;  [facility];  Service: Endoscopy;  Laterality: N/A;   POUCHOSCOPY N/A 07/10/2019   Procedure: POUCHOSCOPY;  Surgeon: [name], MD;  Location: [facility];  Service: General;  Laterality: N/A;   POUCHOSCOPY N/A 01/27/2021   Procedure: POUCHOSCOPY;  Surgeon: [name], MD;  [facility];  Service: Endoscopy;  Laterality: N/A;   POUCHOSCOPY N/A 08/02/2022   Procedure: POUCHOSCOPY;  Surgeon: Mansouraty, Netty Starring., MD;  Location: [facility];  Service: Gastroenterology;  Laterality: N/A;   THYROID LOBECTOMY  05/20/2012   Procedure: THYROID LOBECTOMY;  Surgeon: Ardeth Sportsman, MD;  Location: [facility];  Service: General;  Laterality: Left;  LEFT THYROID LOBECTOMY   UPPER GASTROINTESTINAL ENDOSCOPY     WISDOM TOOTH EXTRACTION     Current Facility-Administered Medications  Medication Dose Route Frequency Provider Last Rate Last Admin   0.9 %  sodium chloride infusion   Intravenous Continuous Mansouraty, Netty Starring., MD        Current Facility-Administered Medications:    0.9 %  sodium chloride infusion, , Intravenous, Continuous, Mansouraty, Netty Starring., MD Allergies  Allergen Reactions   Ibuprofen Swelling    Made his joints swell with high doses   Family History  Problem Relation Age of Onset   Uterine cancer Mother    Hypertension Mother    Stroke Mother    Colon cancer Mother    Colon polyps Mother    Cancer  Mother        colon, endometrial   Diabetes Father    Obesity Father    Pneumonia Father    Cancer Father        skin - squamous    Breast cancer Sister    Diabetes Paternal Grandmother    Prostate cancer Neg Hx    Esophageal cancer Neg Hx    Inflammatory bowel disease Neg Hx    Liver disease Neg Hx    Pancreatic cancer Neg Hx    Rectal cancer Neg Hx    Social History   Socioeconomic History   Marital status: Married    Spouse name: June   Number of children: 0   Years of education: Not on file   Highest education level: Not on file  Occupational History   Occupation: bus Air traffic controller: [employer]    Comment: 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, 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  Tobacco Use   Smoking status: Former    Current packs/day: 0.00    Types: Cigarettes    Quit date: 08/07/1991    Years since quitting: 32.1   Smokeless tobacco: Never   Tobacco comments:    quit in 1993  Vaping Use   Vaping status: Never Used  Substance and Sexual Activity  Alcohol use: No   Drug use: No   Sexual activity: Not on file  Other Topics Concern   Not on file  Social History Narrative   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[employer])   Bus 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   Married 1996   No kids   Social Drivers of Corporate investment banker Strain: Not on file  Food Insecurity: Not on file  Transportation Needs: Not on file  Physical Activity: Not on file  Stress: Not on file  Social Connections: Not on file  Intimate Partner Violence: Not on file    Physical Exam: Today's Vitals   09/23/23 1122 09/30/23 0651  BP:  (!) 142/69  Pulse:  84  Resp:  13  Temp:  (!) 97.1 F (36.2 C)  TempSrc:  Tympanic  SpO2:  100%  Weight: 99.8 kg 99.8 kg  Height:  5' 7.5" (1.715 m)  PainSc:  0-No pain   Body mass index is 33.95 kg/m. GEN: NAD EYE: Sclerae anicteric ENT: MMM CV: Non-tachycardic GI: Soft, NT/ND NEURO:  Alert & Oriented x 3  Lab Results: No results  for input(s): "WBC", "HGB", "HCT", "PLT" in the last 72 hours. BMET No results for input(s): "NA", "K", "CL", "CO2", "GLUCOSE", "BUN", "CREATININE", "CALCIUM" in the last 72 hours. LFT No results for input(s): "PROT", "ALBUMIN", "AST", "ALT", "ALKPHOS", "BILITOT", "BILIDIR", "IBILI" in the last 72 hours. PT/INR No results for input(s): "LABPROT", "INR" in the last 72 hours.   Impression / Plan: This is a 68 y.o.male  who presents for EGD/Pouchoscopy for evaluation of FAP and underlying dysplastic polyps.  The risks and benefits of endoscopic evaluation/treatment were discussed with the patient and/or family; these include but are not limited to the risk of perforation, infection, bleeding, missed lesions, lack of diagnosis, severe illness requiring hospitalization, as well as anesthesia and sedation related illnesses.  The patient's history has been reviewed, patient examined, no change in status, and deemed stable for procedure.  The patient and/or family is agreeable to proceed.    Corliss Parish, MD [facility] Office # [phone]

## 2023-10-01 ENCOUNTER — Encounter: Payer: Self-pay | Admitting: Gastroenterology

## 2023-10-01 ENCOUNTER — Encounter (HOSPITAL_COMMUNITY): Payer: Self-pay | Admitting: Gastroenterology

## 2023-10-01 LAB — SURGICAL PATHOLOGY

## 2023-10-05 ENCOUNTER — Other Ambulatory Visit: Payer: Self-pay | Admitting: Family Medicine

## 2023-10-09 ENCOUNTER — Telehealth: Payer: Self-pay

## 2023-10-09 NOTE — Telephone Encounter (Signed)
 Pharmacy Patient Advocate Encounter   Received notification from CoverMyMeds that prior authorization for [facility] is required/requested.   Insurance verification completed.   The patient is insured through [facility] .   Per test claim: PA required; PA started via CoverMyMeds. KEY [id number] . Waiting for clinical questions to populate.

## 2023-10-10 NOTE — Telephone Encounter (Signed)
 Clinical info submitted.

## 2023-10-13 ENCOUNTER — Other Ambulatory Visit: Payer: Self-pay | Admitting: Family Medicine

## 2023-10-13 DIAGNOSIS — Z862 Personal history of diseases of the blood and blood-forming organs and certain disorders involving the immune mechanism: Secondary | ICD-10-CM

## 2023-10-13 DIAGNOSIS — E119 Type 2 diabetes mellitus without complications: Secondary | ICD-10-CM

## 2023-10-13 DIAGNOSIS — E538 Deficiency of other specified B group vitamins: Secondary | ICD-10-CM

## 2023-10-14 ENCOUNTER — Other Ambulatory Visit: Payer: Self-pay | Admitting: Family Medicine

## 2023-10-14 ENCOUNTER — Other Ambulatory Visit (INDEPENDENT_AMBULATORY_CARE_PROVIDER_SITE_OTHER): Payer: BC Managed Care – PPO

## 2023-10-14 DIAGNOSIS — E538 Deficiency of other specified B group vitamins: Secondary | ICD-10-CM

## 2023-10-14 DIAGNOSIS — Z862 Personal history of diseases of the blood and blood-forming organs and certain disorders involving the immune mechanism: Secondary | ICD-10-CM

## 2023-10-14 DIAGNOSIS — E119 Type 2 diabetes mellitus without complications: Secondary | ICD-10-CM | POA: Diagnosis not present

## 2023-10-14 LAB — CBC WITH DIFFERENTIAL/PLATELET
Basophils Absolute: 0.1 10*3/uL (ref 0.0–0.1)
Basophils Relative: 1 % (ref 0.0–3.0)
Eosinophils Absolute: 0.3 10*3/uL (ref 0.0–0.7)
Eosinophils Relative: 5.4 % — ABNORMAL HIGH (ref 0.0–5.0)
HCT: 40.8 % (ref 39.0–52.0)
Hemoglobin: 13.6 g/dL (ref 13.0–17.0)
Lymphocytes Relative: 29.1 % (ref 12.0–46.0)
Lymphs Abs: 1.5 10*3/uL (ref 0.7–4.0)
MCHC: 33.3 g/dL (ref 30.0–36.0)
MCV: 92.9 fl (ref 78.0–100.0)
Monocytes Absolute: 0.4 10*3/uL (ref 0.1–1.0)
Monocytes Relative: 6.8 % (ref 3.0–12.0)
Neutro Abs: 3.1 10*3/uL (ref 1.4–7.7)
Neutrophils Relative %: 57.7 % (ref 43.0–77.0)
Platelets: 268 10*3/uL (ref 150.0–400.0)
RBC: 4.39 Mil/uL (ref 4.22–5.81)
RDW: 14 % (ref 11.5–15.5)
WBC: 5.3 10*3/uL (ref 4.0–10.5)

## 2023-10-14 LAB — FERRITIN: Ferritin: 46.9 ng/mL (ref 22.0–322.0)

## 2023-10-14 LAB — IRON: Iron: 38 ug/dL — ABNORMAL LOW (ref 42–165)

## 2023-10-14 LAB — HEMOGLOBIN A1C: Hgb A1c MFr Bld: 10.4 % — ABNORMAL HIGH (ref 4.6–6.5)

## 2023-10-14 LAB — VITAMIN B12: Vitamin B-12: 265 pg/mL (ref 211–911)

## 2023-10-17 LAB — FRUCTOSAMINE: Fructosamine: 324 umol/L — ABNORMAL HIGH (ref 205–285)

## 2023-10-21 ENCOUNTER — Encounter: Payer: Self-pay | Admitting: Family Medicine

## 2023-10-21 ENCOUNTER — Ambulatory Visit: Payer: BC Managed Care – PPO | Admitting: Family Medicine

## 2023-10-21 VITALS — BP 126/70 | HR 89 | Temp 97.9°F | Ht 67.5 in | Wt 227.6 lb

## 2023-10-21 DIAGNOSIS — Z7985 Long-term (current) use of injectable non-insulin antidiabetic drugs: Secondary | ICD-10-CM

## 2023-10-21 DIAGNOSIS — E119 Type 2 diabetes mellitus without complications: Secondary | ICD-10-CM

## 2023-10-21 DIAGNOSIS — Z7984 Long term (current) use of oral hypoglycemic drugs: Secondary | ICD-10-CM

## 2023-10-21 DIAGNOSIS — Z862 Personal history of diseases of the blood and blood-forming organs and certain disorders involving the immune mechanism: Secondary | ICD-10-CM

## 2023-10-21 DIAGNOSIS — E538 Deficiency of other specified B group vitamins: Secondary | ICD-10-CM | POA: Diagnosis not present

## 2023-10-21 MED ORDER — FERROUS SULFATE 325 (65 FE) MG PO TABS: 325.0000 mg | ORAL_TABLET | Freq: Every day | ORAL | 3 refills | Status: AC

## 2023-10-21 MED ORDER — METFORMIN HCL 1000 MG PO TABS: 2000.0000 mg | ORAL_TABLET | Freq: Every day | ORAL | 3 refills | Status: AC

## 2023-10-21 MED ORDER — VITAMIN B-12 1000 MCG PO TABS: 1000.0000 ug | ORAL_TABLET | ORAL | Status: AC

## 2023-10-21 MED ORDER — LEVOTHYROXINE SODIUM 100 MCG PO TABS: 100.0000 ug | ORAL_TABLET | Freq: Every day | ORAL | 3 refills | Status: AC

## 2023-10-21 MED ORDER — ATORVASTATIN CALCIUM 20 MG PO TABS: 20.0000 mg | ORAL_TABLET | Freq: Every day | ORAL | 3 refills | Status: AC

## 2023-10-21 MED ORDER — OMEPRAZOLE 40 MG PO CPDR: 40.0000 mg | DELAYED_RELEASE_CAPSULE | Freq: Every day | ORAL | Status: DC

## 2023-10-21 MED ORDER — GLIMEPIRIDE 2 MG PO TABS: ORAL_TABLET | ORAL | 3 refills | Status: AC

## 2023-10-21 NOTE — Progress Notes (Unsigned)
 Diabetes:  Using medications without difficulties:yes Hypoglycemic episodes:no Hyperglycemic episodes:no Feet problems: no Blood Sugars averaging: usually ~140 eye exam within last year: yes Prev MALB d/w pt.  Ratio <20 and already taking jardiance metformin mounjaro and glimepiride.   He has endo f/u pending.   He is off aspirin in the meantime, will restart.  D/w pt about options, he could take it MWF if needed due to bruising.   H/o anemia on iron.  D/w pt about continued iron and inc B12 to twice a week.    Meds, vitals, and allergies reviewed.  ROS: Per HPI unless specifically indicated in ROS section   GEN: nad, alert and oriented HEENT: ncat NECK: supple w/o LA CV: rrr. PULM: ctab, no inc wob ABD: soft, +bs EXT: no edema SKIN: well perfused.

## 2023-10-21 NOTE — Patient Instructions (Signed)
 Try taking B12 twice a week and recheck in about 6 months with labs ahead of time.  Yearly visit this fall.  Take care.  Glad to see you.

## 2023-10-23 NOTE — Assessment & Plan Note (Signed)
 H/o anemia on iron.  D/w pt about continued iron and inc B12 to twice a week.  Labs discussed with patient.

## 2023-10-23 NOTE — Assessment & Plan Note (Signed)
 He can try taking B12 twice a week and recheck in about 6 months with labs ahead of time.  Yearly visit this fall.

## 2023-10-23 NOTE — Assessment & Plan Note (Signed)
 Prev MALB d/w pt.  Ratio <20 and already taking jardiance metformin mounjaro and glimepiride.   He has endo f/u pending.  No change in meds at this point.  Fructosamine can the A1c of 7.1.

## 2023-10-29 ENCOUNTER — Other Ambulatory Visit (HOSPITAL_COMMUNITY): Payer: Self-pay

## 2023-10-29 ENCOUNTER — Other Ambulatory Visit: Payer: Self-pay | Admitting: Internal Medicine

## 2023-10-31 ENCOUNTER — Telehealth: Payer: Self-pay | Admitting: Family Medicine

## 2023-10-31 NOTE — Telephone Encounter (Signed)
Please and thanks.

## 2023-10-31 NOTE — Telephone Encounter (Signed)
 After we verify patient not medicare ok to schedule injection as requested.

## 2023-10-31 NOTE — Telephone Encounter (Signed)
 Copied from CRM [id number]. Topic: Appointments - Scheduling Inquiry for Clinic >> Oct 31, 2023 10:51 AM Elizebeth Brooking wrote: Reason for CRM: Would like to get shingles shot schedule would like for someone to give him a callback regarding this

## 2023-11-01 NOTE — Telephone Encounter (Signed)
 Spoke to pt, pt has [place] for insurance, scheduled nv for 11/07/23

## 2023-11-01 NOTE — Telephone Encounter (Signed)
 I need to correct my prev statement.  He had shingrix prev and doesn't need to repeat.  Thanks.

## 2023-11-01 NOTE — Telephone Encounter (Signed)
 Per immunization record pt had shingrix at [facility] on 05/15/2018 and 07/16/2018. Pt is scheduled on nurse visit schedule 11/07/23 for shingrix. Please advise. l

## 2023-11-04 NOTE — Telephone Encounter (Signed)
 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 Pts wife (DPR signed) notified as instructed and she will let pt know and Mrs saldivar told me to cancel NV appt for shingrix injection. Done.

## 2023-11-07 ENCOUNTER — Ambulatory Visit

## 2023-11-26 ENCOUNTER — Ambulatory Visit: Payer: BC Managed Care – PPO | Admitting: Internal Medicine

## 2023-11-26 ENCOUNTER — Encounter: Payer: Self-pay | Admitting: Internal Medicine

## 2023-11-26 VITALS — BP 126/64 | HR 94 | Ht 67.5 in | Wt 226.2 lb

## 2023-11-26 DIAGNOSIS — E890 Postprocedural hypothyroidism: Secondary | ICD-10-CM

## 2023-11-26 DIAGNOSIS — N182 Chronic kidney disease, stage 2 (mild): Secondary | ICD-10-CM

## 2023-11-26 DIAGNOSIS — E1122 Type 2 diabetes mellitus with diabetic chronic kidney disease: Secondary | ICD-10-CM | POA: Diagnosis not present

## 2023-11-26 DIAGNOSIS — E66811 Obesity, class 1: Secondary | ICD-10-CM | POA: Diagnosis not present

## 2023-11-26 DIAGNOSIS — Z7984 Long term (current) use of oral hypoglycemic drugs: Secondary | ICD-10-CM | POA: Diagnosis not present

## 2023-11-26 DIAGNOSIS — D34 Benign neoplasm of thyroid gland: Secondary | ICD-10-CM

## 2023-11-26 MED ORDER — TIRZEPATIDE 10 MG/0.5ML ~~LOC~~ SOAJ: 10.0000 mg | SUBCUTANEOUS | 3 refills | Status: DC

## 2023-11-26 NOTE — Patient Instructions (Addendum)
 Please continue: - Metformin  2000 mg with dinner - Glimepiride  4 mg in am and 2 mg in pm - Jardiance  10 mg daily at night  Please increase: - Mounjaro  10 mg weekly    Please continue levothyroxine  100 mcg daily.   Take the thyroid  hormone every day, with water, at least 30 minutes before breakfast, separated by at least 4 hours from: - acid reflux medications - calcium  - iron  - multivitamins   Please return in ~5 months with your sugar log.

## 2023-11-26 NOTE — Progress Notes (Signed)
 Patient ID: Cory Harper, male   DOB: 1955/09/05, 68 y.o.   MRN: [id number]   HPI: Cory Harper is a 68 y.o.-year-old male, returning for follow-up for DM2, dx in 1998, non-insulin -dependent, uncontrolled, with long term complications (+ mild CKD).  Last visit 5 months ago  Interim history: No increased urination, blurry vision, nausea, chest pain. 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  He is still very busy and still not exercising.  Also, he continues to work long hours: Wakes up at 3:30 am, starts the day at 4:45 am >> dinner is late.  He plans to retire in 2 years.  Reviewed his HbA1c levels: 10/14/2023: HbA1c calculated from fructosamine is 7.1% Lab Results  Component Value Date   HGBA1C 10.4 (H) 10/14/2023   HGBA1C 11.1 (A) 12/24/2022   HGBA1C 11.1 (H) 01/05/2022  04/16/2023: HbA1c calculated from fructosamine is 7.1% 07/10/2022: HbA1c calculated from fructosamine was 9.2%, higher 09/01/2021: HbA1c calculated from fructosamine is 8.36%, slightly higher than before.   06/13/2021: HbA1c calculated from fructosamine is 7.9% 12/06/2020: HbA1c calculated from fructosamine is 7.8%, higher than before, but lower than the directly measured HbA1c. 04/01/2020: HbA1c calculated from fructosamine is 7.4%, correlating better with your sugars at home. 11/18/2019: HbA1c calculated from fructosamine is 7.5%. 06/17/2019: HbA1c calculated from fructosamine is 7.0%! 05/22/2018: HbA1c calculated from the fructosamine is much better than the measured one, at 7.6%.  He is on: - Metformin  1000 mg 2x a day with meals >> 2000 mg with dinner - Amaryl  4 mg in am and 2 mg in pm - Jardiance  10 mg daily at night to avoid daytime urinary frequency-started 03/2017 - Trulicity  4.5 mg weekly >> Mounjaro  5 mg weekly >> off >> Mounjaro  5 (started 02/2023) >> 7.5 mg weekly (started 03/2023) Tried Actos >> signif. Weight gain On Cinnamon. He was on Januvia , Ozempic .  He cannot be on insulin   injections as he is in school bus driver.  No plans for retirement yet, but ~in 2 years.  Pt checks his sugars 1-2 times a day -he declined a CGM: - am: 125, 156-180 >> 86, 127-180 >> 136-180, 192 - 2h after b'fast:  n/c >> 158-220 >> 128-180 >> n/c - before lunch: 161-165 >> 172-180 >> n/c >> 129-180 - 2h after lunch: 80, 183 >> 175-180 >> 178, 179 - before dinner: 123 >> n/c >> 151, 160-164 >> n/c  - 2h after dinner:160, 161 >> 180 >> 171-178 >> 175, 180 - bedtime: n/c>> 158, 166 >> 165 >> 169 >> n/c - nighttime: n/c >> 180 >> n/c Lowest sugar was  86 >> 129; it is unclear at which CBG level he has hypoglycemia awareness. Highest sugar was 220 >> 180 >> 192.  Glucometer: One Touch Ultra mini>> One Touch Ultra  Pt's meals are: - Breakfast: bowl of grits, [place] bacon before starts work, - Lunch: [place] or ham+cheese wrap or tuna salad - Dinner: baked chicken or pork chop, green beans - Snacks: crackers, desserts  Previously using a stationary bike in the evening -not recently.  -+ Mild CKD, last BUN/creatinine:  Lab Results  Component Value Date   BUN 22 04/16/2023   BUN 19 07/10/2022   CREATININE 1.14 04/16/2023   CREATININE 1.08 07/10/2022   Lab Results  Component Value Date   GFRNONAA 68 (L) 09/22/2012   Lab Results  Component Value Date   MICRALBCREAT 0.9 04/16/2023   MICRALBCREAT 2.0 07/10/2022   MICRALBCREAT 0.7 06/13/2021  MICRALBCREAT 0.9 06/27/2020   MICRALBCREAT 1.4 05/01/2018   MICRALBCREAT 0.5 03/12/2017   MICRALBCREAT 0.4 12/16/2015   MICRALBCREAT 2.3 02/18/2015   MICRALBCREAT 0.2 03/11/2014   MICRALBCREAT 0.7 04/27/2013   + HL; last set of lipids: Lab Results  Component Value Date   CHOL 120 04/16/2023   HDL 40.80 04/16/2023   LDLCALC 43 04/16/2023   LDLDIRECT 54.0 07/10/2022   TRIG 181.0 (H) 04/16/2023   CHOLHDL 3 04/16/2023  On Lipitor 20. On ASA 81.  - last eye exam was 04/16/2023: No DR.  -No numbness and tingling in his feet.  Last  foot exam 04/22/2023.  Pt has no FH of DM.  He has familial polyposis and has a history of colon cancer >> has a J-pouch - he had a pouchoscopy in 07/2019.  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  Thyroid  ultrasound (02/2018): 1. Post left thyroid  lobectomy without evidence of locally residual or locally recurrent disease. 2. Normal appearance of the remaining thyroid  parenchyma.  Pt denies: - feeling nodules in neck - hoarseness - dysphagia - choking  Postsurgical hypothyroidism:  Pt is on levothyroxine  100 mcg daily, taken: - in am - fasting - at least 30 min from b'fast - no Ca, MVI, PPIs - on Fe 2x a day - am (30 min after LT4) and evening >> moved later in the day - not on Biotin  Reviewed his TFTs: Lab Results  Component Value Date   TSH 2.85 04/16/2023   TSH 3.70 07/10/2022   TSH 3.59 12/27/2020   TSH 4.12 01/26/2020   TSH 3.33 06/17/2019   TSH 3.01 05/22/2018   TSH 3.46 07/25/2017   TSH 2.29 06/27/2016   TSH 1.65 06/14/2015   TSH 1.63 03/11/2014   ROS: + see HPI + leg swelling  I reviewed pt's medications, allergies, PMH, social hx, family hx, and changes were documented in the history of present illness. Otherwise, unchanged from my initial visit note.  Past Medical History:  Diagnosis Date   Anemia    Arthritis    Cancer (HCC) 2013   stg II colon cancer s/p resection   Colonic mass    Diabetes mellitus    FAP (familial adenomatous polyposis)    Heart murmur    Hyperlipidemia    Hypertension    not on blood pressure meds since 11/13    Hypothyroidism    Nasal congestion    Past Surgical History:  Procedure Laterality Date   BIOPSY  01/15/2018   Procedure: BIOPSY;  Surgeon: Joyce Nixon, MD;  Location: [facility];  Service: Endoscopy;;   BIOPSY  07/10/2019   Procedure: BIOPSY;  Surgeon: Joyce Nixon, MD;  Location: [facility];  Service:  General;;   BIOPSY  01/27/2021   Procedure: BIOPSY;  Surgeon: Joyce Nixon, MD;  Location: [facility];  Service: Endoscopy;;   BIOPSY  05/10/2022   Procedure: BIOPSY;  Surgeon: Normie Becton., MD;  Location: [facility];  Service: Gastroenterology;;   BIOPSY  08/02/2022   Procedure: BIOPSY;  Surgeon: Normie Becton., MD;  Location: [facility];  Service: Gastroenterology;;   BIOPSY  12/17/2022   Procedure: BIOPSY;  Surgeon: Normie Becton., MD;  [facility]: [facility];  Service: Gastroenterology;;   BOWEL RESECTION  05/20/2012   COLON RESECTION  05/20/2012   Procedure: COLON RESECTION LAPAROSCOPIC;  Surgeon: Eddye Goodie, MD;  Location: [facility];  Service: General;  Laterality: N/A;  Laparoscopic Proctocolectomy, Ileal Pouch Anal Anastomoisis, Loop Ileostomy   ENDOSCOPIC MUCOSAL RESECTION  08/02/2022   Procedure: ENDOSCOPIC MUCOSAL RESECTION;  Surgeon: Brice Campi Albino Alu., MD;  Location: [facility];  Service: Gastroenterology;;   ESOPHAGOGASTRODUODENOSCOPY (EGD) WITH PROPOFOL  N/A 04/23/2013   Procedure: ESOPHAGOGASTRODUODENOSCOPY (EGD) WITH PROPOFOL ;  Surgeon: Janel Medford, MD;  [facility]: [facility];  Service: Endoscopy;  Laterality: N/A;  side viewing scope   ESOPHAGOGASTRODUODENOSCOPY (EGD) WITH PROPOFOL  N/A 12/17/2022   Procedure: ESOPHAGOGASTRODUODENOSCOPY (EGD) WITH PROPOFOL ;  Surgeon: Brice Campi Albino Alu., MD;  [facility]: [facility];  Service: Gastroenterology;  Laterality: N/A;   ESOPHAGOGASTRODUODENOSCOPY (EGD) WITH PROPOFOL  N/A 09/30/2023   Procedure: ESOPHAGOGASTRODUODENOSCOPY (EGD) WITH PROPOFOL ;  Surgeon: Brice Campi Albino Alu., MD;  [facility]: [facility];  Service: Gastroenterology;  Laterality: N/A;   FLEXIBLE SIGMOIDOSCOPY  08/01/2012   Procedure: FLEXIBLE SIGMOIDOSCOPY;  Surgeon: Joyce Nixon, MD;  [facility]: [facility];  Service: Endoscopy;  Laterality: N/A;  use endoscope   FLEXIBLE SIGMOIDOSCOPY N/A 11/29/2014   Procedure: FLEXIBLE  SIGMOIDOSCOPY/POUCHOSCOPY;  Surgeon: Joyce Nixon, MD;  [facility];  Service: Endoscopy;  Laterality: N/A;   FLEXIBLE SIGMOIDOSCOPY N/A 05/10/2022   Procedure: FLEXIBLE SIGMOIDOSCOPY;  Surgeon: Brice Campi Albino Alu., MD;  Location: [facility];  Service: Gastroenterology;  Laterality: N/A;   FLEXIBLE SIGMOIDOSCOPY N/A 12/17/2022   Procedure: FLEXIBLE SIGMOIDOSCOPY;  Surgeon: Brice Campi Albino Alu., MD;  Location: [facility];  Service: Gastroenterology;  Laterality: N/A;   FLEXIBLE SIGMOIDOSCOPY N/A 09/30/2023   Procedure: FLEXIBLE SIGMOIDOSCOPY;  Surgeon: Brice Campi Albino Alu., MD;  Location: [facility];  Service: Gastroenterology;  Laterality: N/A;   FOREIGN BODY REMOVAL  08/02/2022   Procedure: FOREIGN BODY REMOVAL;  Surgeon: Brice Campi Albino Alu., MD;  Location: [facility];  Service: Gastroenterology;;   FOREIGN BODY REMOVAL  09/30/2023   Procedure: FOREIGN BODY REMOVAL;  Surgeon: Normie Becton., MD;  Location: [facility];  Service: Gastroenterology;;   ILEOSTOMY  05/20/12   ILEOSTOMY CLOSURE N/A 09/18/2012   Procedure: Loop Ileostomy Takedown with EUA ;  Surgeon: Eddye Goodie, MD;  Location: [facility];  Service: General;  Laterality: N/A;  loop ileostomy takedown with EUA    KNEE ARTHROSCOPY  1998   Right knee   POLYPECTOMY  05/10/2022   Procedure: POLYPECTOMY;  Surgeon: Normie Becton., MD;  Location: [facility];  Service: Gastroenterology;;   POLYPECTOMY  08/02/2022   Procedure: POLYPECTOMY;  Surgeon: Normie Becton., MD;  [facility];  Service: Gastroenterology;;   POLYPECTOMY  12/17/2022   Procedure: POLYPECTOMY;  Surgeon: Normie Becton., MD;  [facility];  Service: Gastroenterology;;   POLYPECTOMY  09/30/2023   Procedure: POLYPECTOMY;  Surgeon: Normie Becton., MD;  Location: [facility];  Service: Gastroenterology;;   POUCHOSCOPY  11/16/2013   Procedure: POUCHOSCOPY;  Surgeon: Joyce Nixon, MD;   [facility];  Service: Endoscopy;;   POUCHOSCOPY N/A 11/16/2015   Procedure: POUCHOSCOPY;  Surgeon: Joyce Nixon, MD;  Location: [facility];  Service: Endoscopy;  Laterality: N/A;   POUCHOSCOPY N/A 12/20/2016   Procedure: POUCHOSCOPY;  Surgeon: Joyce Nixon, MD;  [facility]: [facility];  Service: Endoscopy;  Laterality: N/A;   POUCHOSCOPY N/A 01/15/2018   Procedure: POUCHOSCOPY;  Surgeon: Joyce Nixon, MD;  Location: [facility];  Service: Endoscopy;  Laterality: N/A;   POUCHOSCOPY N/A 07/10/2019   Procedure: POUCHOSCOPY;  Surgeon: Joyce Nixon, MD;  Location: [facility];  Service: General;  Laterality: N/A;   POUCHOSCOPY N/A 01/27/2021   Procedure: POUCHOSCOPY;  Surgeon: Joyce Nixon, MD;  [facility];  Service: Endoscopy;  Laterality: N/A;   POUCHOSCOPY N/A 08/02/2022   Procedure: POUCHOSCOPY;  Surgeon: Mansouraty, Albino Alu., MD;  Location: [facility];  Service: Gastroenterology;  Laterality: N/A;   THYROID  LOBECTOMY  05/20/2012   Procedure: THYROID  LOBECTOMY;  Surgeon: Eddye Goodie, MD;  Location: [facility];  Service: General;  Laterality: Left;  LEFT THYROID  LOBECTOMY   UPPER GASTROINTESTINAL ENDOSCOPY     WISDOM TOOTH EXTRACTION     Social History   Socioeconomic History   Marital status: Married    Spouse name: June   Number of children: 0  Social Needs  Occupational History   Occupation: bus Air traffic controller: [employer]    Comment: 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, 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  Tobacco Use   Smoking status: Former Smoker    Last attempt to quit: 08/07/1991    Years since quitting: 25.9   Smokeless tobacco: Never Used   Tobacco comment: quit in 1993  Substance and Sexual Activity   Alcohol use: No   Drug use: No   Sexual activity: Not on file  Other Topics Concern   Not on file  Social History Narrative   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     aspirin  81 MG tablet Take 1 tablet (81 mg total) by mouth daily. 30 tablet    atorvastatin  (LIPITOR) 20 MG tablet Take 1 tablet (20 mg total) by mouth daily. 90 tablet 3   Blood Glucose Monitoring Suppl (ONE TOUCH ULTRA SYSTEM KIT) W/DEVICE KIT Dispense 1 kit with 100 lancets and strips, with 3 rf on lancets and strips.  Check sugar daily.  Dx E11.9 1 each 0   cholecalciferol  (VITAMIN D ) 1000 UNITS tablet Take 1,000 Units by mouth in the morning.     cyanocobalamin  (VITAMIN B12) 1000 MCG tablet Take 1 tablet (1,000 mcg total) by mouth 2 (two) times a week.     empagliflozin  (JARDIANCE ) 10 MG TABS tablet TAKE 1 TABLET BY MOUTH DAILY 90 tablet 0   ferrous sulfate  (FEROSUL) 325 (65 FE) MG tablet Take 1 tablet (325 mg total) by mouth daily. 90 tablet 3   fluticasone  (FLONASE ) 50 MCG/ACT nasal spray Harper LIQUID AND USE 2 SPRAYS IN EACH NOSTRIL DAILY 48 g 1   glimepiride  (AMARYL ) 2 MG tablet TAKE 2 TABLETS BY MOUTH IN THE MORNING THEN TAKE 1 TABLET BY MOUTH IN THE EVENING 270 tablet 3   levothyroxine  (SYNTHROID ) 100 MCG tablet Take 1 tablet (100 mcg total) by mouth daily before breakfast. 90 tablet 3   loperamide  (ANTI-DIARRHEAL) 2 MG tablet TAKE 1-2 TABLETS BY MOUTH TWICE DAILY AS NEEDED 360 tablet 3   loratadine  (CLARITIN ) 10 MG tablet Take 10 mg by mouth in the morning.     metFORMIN  (GLUCOPHAGE ) 1000 MG tablet Take 2 tablets (2,000 mg total) by mouth daily. 180 tablet 3   MOUNJARO  7.5 MG/0.5ML Pen INJECT 7.5 MG SUBCUTANEOUSLY ONCE WEEKLY 6 mL 1   omeprazole  (PRILOSEC) 40 MG capsule Take 1 capsule (40 mg total) by mouth daily.     ONETOUCH ULTRA test strip USE AS DIRECTED TO CHECK BLOOD GLUCOSE EVERY DAY 100 strip 2  No current facility-administered medications on file prior to visit.    Allergies  Allergen Reactions   Ibuprofen Swelling    Made his joints swell with high doses   Family History  Problem Relation Age of Onset   Uterine cancer Mother    Hypertension Mother    Stroke Mother    Colon cancer Mother    Colon polyps Mother    Cancer Mother        colon, endometrial   Diabetes Father    Obesity Father    Pneumonia Father    Cancer Father        skin - squamous    Breast cancer Sister    Diabetes Paternal Grandmother    Prostate cancer Neg Hx    Esophageal cancer Neg Hx    Inflammatory bowel disease Neg Hx    Liver disease Neg Hx    Pancreatic cancer Neg Hx    Rectal cancer Neg Hx    PE: BP 126/64   Pulse 94   Ht 5' 7.5" (1.715 m)   Wt 226 lb 3.2 oz (102.6 kg)   SpO2 98%   BMI 34.91 kg/m  Wt Readings from Last 10 Encounters:  11/26/23 226 lb 3.2 oz (102.6 kg)  10/21/23 227 lb 9.6 oz (103.2 kg)  09/30/23 220 lb 0.3 oz (99.8 kg)  06/26/23 226 lb 6 oz (102.7 kg)  06/17/23 225 lb 3.2 oz (102.2 kg)  05/24/23 221 lb 12.8 oz (100.6 kg)  04/22/23 216 lb (98 kg)  12/24/22 217 lb 9.6 oz (98.7 kg)  12/17/22 220 lb (99.8 kg)  08/10/22 218 lb (98.9 kg)   Constitutional: overweight, in NAD Eyes: EOMI, no exophthalmos ENT: no thyromegaly, no cervical lymphadenopathy Cardiovascular: Tachycardia, RR, No MRG, + R>L LE swelling (pitting) - chronic Respiratory: CTA B Musculoskeletal: no deformities Skin: no rashes Neurological: no tremor with outstretched hands  ASSESSMENT: 1. DM2, non-insulin -dependent, uncontrolled, with complications - CKD stage 2  2. H/o Hurthle cell neoplasm  3.  Postsurgical hypothyroidism  4.  Obesity class I  PLAN:  1. Patient with longstanding, uncontrolled, type 2 diabetes, treated with oral antidiabetic regimen with metformin , sulfonylurea, SGLT2 inhibitor, and also weekly GLP-1 receptor agonist.  We stopped Trulicity  and switched to Mounjaro .  This was recently approved with a PA until 07/2026.  Of note, his  fructosamine levels are usually better evaluating his glucose controlled and directly measured HbA1c.  Latest fructosamine level was from 10/14/2023 with a calculated HbA1c at approximately 7.1%, stable from last visit.  At last visit, sugars appeared to be slightly improved, so I did not change his regimen.  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  He is planning to retire in approximately 2 years. - at today's visit, sugars are above target in the morning, usually fluctuating in the 150s to 170s and they remain fairly stable in the rest of the day.  Unfortunately, we are not able to use insulin .  We discussed about the need to start exercising and he would like to start doing so before next visit.  We also can increase the Mounjaro  further, to 10 mg daily.  He tolerates well the lower dose. - I suggested to:  Patient Instructions  Please continue: - Metformin  2000 mg with dinner - Glimepiride  4 mg in am and 2 mg in pm - Jardiance  10 mg daily at night  Please increase: - Mounjaro  10 mg weekly    Please continue  levothyroxine  100 mcg daily.   Take the thyroid  hormone every day, with water, at least 30 minutes before breakfast, separated by at least 4 hours from: - acid reflux medications - calcium  - iron  - multivitamins   Please return in ~5 months with your sugar log.  - advised to check sugars at different times of the day - 1x a day, rotating check times - advised for yearly eye exams >> he is UTD - return to clinic in ~5 months  2. H/o Hurthle cell neoplasm -Latest ultrasound report showed no suspicious masses in the lower neck.  The final left thyroid  pathology was benign -On the ultrasound from 2017, he had an enlarged lymph node, however, this was not obvious on the latest ultrasound from 2019 - Denies problems swallowing, choking, neck pressure - No further imaging needed unless he develops neck compression symptoms or masses felt on palpation of his  neck  3.  Postsurgical hypothyroidism - latest thyroid  labs reviewed with pt. >> normal: Lab Results  Component Value Date   TSH 2.85 04/16/2023  - he continues on LT4 100 mcg daily - pt feels good on this dose. - we discussed about taking the thyroid  hormone every day, with water, >30 minutes before breakfast, separated by >4 hours from acid reflux medications, calcium , iron , multivitamins. Pt. is taking it correctly. - will check thyroid  tests at next visit  4.  Obesity class I -continue SGLT 2 inhibitor and GLP-1/GIP receptor agonist which should also help with weight loss -will increase the Mounjaro  dose now - He gained 4 pounds before last visit but gained 5 pounds since then  Emilie Harden, MD PhD [facility] Endocrinology

## 2023-11-27 ENCOUNTER — Encounter: Payer: Self-pay | Admitting: Internal Medicine

## 2023-11-27 LAB — MICROALBUMIN / CREATININE URINE RATIO
Creatinine, Urine: 39 mg/dL (ref 20–320)
Microalb, Ur: <0.2 mg/dL

## 2023-12-03 ENCOUNTER — Other Ambulatory Visit: Payer: Self-pay

## 2023-12-03 DIAGNOSIS — E1122 Type 2 diabetes mellitus with diabetic chronic kidney disease: Secondary | ICD-10-CM

## 2023-12-03 MED ORDER — TIRZEPATIDE 10 MG/0.5ML ~~LOC~~ SOAJ: 10.0000 mg | SUBCUTANEOUS | 3 refills | Status: AC

## 2023-12-03 NOTE — Telephone Encounter (Signed)
 Pt called stating that the pharmacy needed a dx code on the prescription.  It has been re-sent.   Requested Prescriptions   Signed Prescriptions Disp Refills   tirzepatide  (MOUNJARO ) 10 MG/0.5ML Pen 6 mL 3    Sig: Inject 10 mg into the skin once a week. Dx Code:E11.22    Authorizing Provider: Emilie Harden    Ordering User: Vernon Goodpasture

## 2023-12-25 ENCOUNTER — Other Ambulatory Visit (HOSPITAL_COMMUNITY): Payer: Self-pay

## 2024-01-02 ENCOUNTER — Other Ambulatory Visit: Payer: Self-pay | Admitting: Family Medicine

## 2024-01-08 ENCOUNTER — Other Ambulatory Visit: Payer: Self-pay | Admitting: Gastroenterology

## 2024-01-10 ENCOUNTER — Other Ambulatory Visit: Payer: Self-pay | Admitting: Family Medicine

## 2024-03-28 ENCOUNTER — Other Ambulatory Visit: Payer: Self-pay | Admitting: Gastroenterology

## 2024-04-12 ENCOUNTER — Other Ambulatory Visit: Payer: Self-pay | Admitting: Family Medicine

## 2024-04-12 DIAGNOSIS — E7800 Pure hypercholesterolemia, unspecified: Secondary | ICD-10-CM

## 2024-04-12 DIAGNOSIS — Z862 Personal history of diseases of the blood and blood-forming organs and certain disorders involving the immune mechanism: Secondary | ICD-10-CM

## 2024-04-12 DIAGNOSIS — E119 Type 2 diabetes mellitus without complications: Secondary | ICD-10-CM

## 2024-04-12 DIAGNOSIS — E538 Deficiency of other specified B group vitamins: Secondary | ICD-10-CM

## 2024-04-16 ENCOUNTER — Encounter: Payer: Self-pay | Admitting: Family Medicine

## 2024-04-16 ENCOUNTER — Other Ambulatory Visit

## 2024-04-16 LAB — HM DIABETES EYE EXAM

## 2024-04-23 ENCOUNTER — Encounter: Admitting: Family Medicine

## 2024-04-28 ENCOUNTER — Ambulatory Visit: Admitting: Internal Medicine

## 2024-04-28 ENCOUNTER — Telehealth: Payer: Self-pay | Admitting: Gastroenterology

## 2024-04-28 NOTE — Telephone Encounter (Signed)
 Inbound call from patient stating that he is requesting to schedule a procedure at the hospital. Patient is requesting a call back .Please advise.

## 2024-04-29 ENCOUNTER — Other Ambulatory Visit: Payer: Self-pay

## 2024-04-29 DIAGNOSIS — D1391 Familial adenomatous polyposis: Secondary | ICD-10-CM

## 2024-04-29 DIAGNOSIS — Z85038 Personal history of other malignant neoplasm of large intestine: Secondary | ICD-10-CM

## 2024-04-29 DIAGNOSIS — D132 Benign neoplasm of duodenum: Secondary | ICD-10-CM

## 2024-04-29 DIAGNOSIS — D128 Benign neoplasm of rectum: Secondary | ICD-10-CM

## 2024-04-29 DIAGNOSIS — Z860101 Personal history of adenomatous and serrated colon polyps: Secondary | ICD-10-CM

## 2024-04-29 MED ORDER — NA SULFATE-K SULFATE-MG SULF 17.5-3.13-1.6 GM/177ML PO SOLN: 1.0000 | Freq: Once | ORAL | 0 refills | Status: DC

## 2024-04-29 NOTE — Telephone Encounter (Signed)
 Endo colon has been set up for 06/23/24 at [facility] with GM at 945 am

## 2024-04-29 NOTE — Telephone Encounter (Signed)
 Spoke with the pt and discussed endo colon  The pt does use mag citrate for prep so new instructions have been sent and pt aware he does not need to have prep from pharmacy,

## 2024-05-06 ENCOUNTER — Other Ambulatory Visit

## 2024-05-06 DIAGNOSIS — E7800 Pure hypercholesterolemia, unspecified: Secondary | ICD-10-CM

## 2024-05-06 DIAGNOSIS — Z862 Personal history of diseases of the blood and blood-forming organs and certain disorders involving the immune mechanism: Secondary | ICD-10-CM | POA: Diagnosis not present

## 2024-05-06 DIAGNOSIS — E538 Deficiency of other specified B group vitamins: Secondary | ICD-10-CM | POA: Diagnosis not present

## 2024-05-06 DIAGNOSIS — E119 Type 2 diabetes mellitus without complications: Secondary | ICD-10-CM | POA: Diagnosis not present

## 2024-05-06 LAB — LIPID PANEL
Cholesterol: 122 mg/dL (ref 0–200)
HDL: 40.2 mg/dL (ref >39.00)
LDL Cholesterol: 45 mg/dL (ref 0–99)
NonHDL: 81.73
Total CHOL/HDL Ratio: 3
Triglycerides: 184 mg/dL — ABNORMAL HIGH (ref 0.0–149.0)
VLDL: 36.8 mg/dL (ref 0.0–40.0)

## 2024-05-06 LAB — CBC WITH DIFFERENTIAL/PLATELET
Basophils Absolute: 0 K/uL (ref 0.0–0.1)
Basophils Relative: 1 % (ref 0.0–3.0)
Eosinophils Absolute: 0.2 K/uL (ref 0.0–0.7)
Eosinophils Relative: 4.1 % (ref 0.0–5.0)
HCT: 39.7 % (ref 39.0–52.0)
Hemoglobin: 13.1 g/dL (ref 13.0–17.0)
Lymphocytes Relative: 31.5 % (ref 12.0–46.0)
Lymphs Abs: 1.5 K/uL (ref 0.7–4.0)
MCHC: 33.1 g/dL (ref 30.0–36.0)
MCV: 91.4 fl (ref 78.0–100.0)
Monocytes Absolute: 0.4 K/uL (ref 0.1–1.0)
Monocytes Relative: 8.3 % (ref 3.0–12.0)
Neutro Abs: 2.6 K/uL (ref 1.4–7.7)
Neutrophils Relative %: 55.1 % (ref 43.0–77.0)
Platelets: 215 K/uL (ref 150.0–400.0)
RBC: 4.35 Mil/uL (ref 4.22–5.81)
RDW: 14.2 % (ref 11.5–15.5)
WBC: 4.7 K/uL (ref 4.0–10.5)

## 2024-05-06 LAB — VITAMIN B12: Vitamin B-12: 465 pg/mL (ref 211–911)

## 2024-05-06 LAB — COMPREHENSIVE METABOLIC PANEL WITH GFR
ALT: 11 U/L (ref 0–53)
AST: 13 U/L (ref 0–37)
Albumin: 3.9 g/dL (ref 3.5–5.2)
Alkaline Phosphatase: 61 U/L (ref 39–117)
BUN: 24 mg/dL — ABNORMAL HIGH (ref 6–23)
CO2: 27 meq/L (ref 19–32)
Calcium: 9.4 mg/dL (ref 8.4–10.5)
Chloride: 102 meq/L (ref 96–112)
Creatinine, Ser: 1.25 mg/dL (ref 0.40–1.50)
GFR: 59.89 mL/min — ABNORMAL LOW (ref >60.00)
Glucose, Bld: 106 mg/dL — ABNORMAL HIGH (ref 70–99)
Potassium: 4.4 meq/L (ref 3.5–5.1)
Sodium: 136 meq/L (ref 135–145)
Total Bilirubin: 0.5 mg/dL (ref 0.2–1.2)
Total Protein: 6.8 g/dL (ref 6.0–8.3)

## 2024-05-06 LAB — TSH: TSH: 3.29 u[IU]/mL (ref 0.35–5.50)

## 2024-05-06 LAB — FERRITIN: Ferritin: 39.9 ng/mL (ref 22.0–322.0)

## 2024-05-06 LAB — HEMOGLOBIN A1C: Hgb A1c MFr Bld: 9.7 % — ABNORMAL HIGH (ref 4.6–6.5)

## 2024-05-07 ENCOUNTER — Ambulatory Visit: Payer: Self-pay | Admitting: Family Medicine

## 2024-05-10 LAB — FRUCTOSAMINE: Fructosamine: 299 umol/L — ABNORMAL HIGH (ref 205–285)

## 2024-05-12 ENCOUNTER — Other Ambulatory Visit: Payer: Self-pay | Admitting: Gastroenterology

## 2024-05-22 ENCOUNTER — Encounter: Payer: Self-pay | Admitting: Family Medicine

## 2024-05-22 ENCOUNTER — Ambulatory Visit: Admitting: Family Medicine

## 2024-05-22 VITALS — BP 126/62 | HR 66 | Temp 97.9°F | Ht 68.62 in | Wt 228.0 lb

## 2024-05-22 DIAGNOSIS — E785 Hyperlipidemia, unspecified: Secondary | ICD-10-CM

## 2024-05-22 DIAGNOSIS — Z7189 Other specified counseling: Secondary | ICD-10-CM

## 2024-05-22 DIAGNOSIS — Z0000 Encounter for general adult medical examination without abnormal findings: Secondary | ICD-10-CM

## 2024-05-22 DIAGNOSIS — E538 Deficiency of other specified B group vitamins: Secondary | ICD-10-CM

## 2024-05-22 DIAGNOSIS — E890 Postprocedural hypothyroidism: Secondary | ICD-10-CM

## 2024-05-22 DIAGNOSIS — Z23 Encounter for immunization: Secondary | ICD-10-CM | POA: Diagnosis not present

## 2024-05-22 DIAGNOSIS — E119 Type 2 diabetes mellitus without complications: Secondary | ICD-10-CM | POA: Diagnosis not present

## 2024-05-22 MED ORDER — EMPAGLIFLOZIN 10 MG PO TABS: 10.0000 mg | ORAL_TABLET | Freq: Every day | ORAL | 3 refills | Status: AC

## 2024-05-22 NOTE — Patient Instructions (Addendum)
 Flu shot today.  PNA-20 vaccine when possible here or at the pharmacy.  You could get it here at the next visit.  Recheck in about 6 months.

## 2024-05-22 NOTE — Progress Notes (Unsigned)
 covid vaccine prev done.   Tetanus 2020 Flu 2025 PNA d/w pt.   Shingles prev done.  J pouch eval by GI 2025 Prostate cancer screening and PSA options (with potential risks and benefits of testing vs not testing) were discussed along with recent recs/guidelines.  He declined testing PSA at this point. Living will d/w pt.  Wife designated if patient were incapacitated.   Diet and exercise d/w pt.  HIV and HCV prev checked 2017.    Diabetes:  Using medications without difficulties: yes Hypoglycemic episodes:no Hyperglycemic episodes:no Feet problems: no Blood Sugars averaging: ~100s eye exam within last year: yes Fructosamine converts to an A1c of approximately 6.7. Labs d/w pt.    Elevated Cholesterol: Using medications without problems: yes Muscle aches: no Diet compliance: d/w pt Exercise: d/w pt.    Hypothyroidism.  Compliant.  TSH wnl.  No ADE on med.   B12 wnl.  Labs d/w pt.    Prev mild URI sx resolved/improved.    PMH and SH reviewed  Meds, vitals, and allergies reviewed.   ROS: Per HPI unless specifically indicated in ROS section   GEN: nad, alert and oriented HEENT: mucous membranes moist NECK: supple w/o LA CV: rrr. PULM: ctab, no inc wob ABD: soft, +bs EXT: no edema SKIN: well perfused.    Diabetic foot exam: Normal inspection No skin breakdown No calluses  Normal DP pulses Normal sensation to light touch and monofilament Nails thickened.

## 2024-05-24 DIAGNOSIS — Z0000 Encounter for general adult medical examination without abnormal findings: Secondary | ICD-10-CM | POA: Insufficient documentation

## 2024-05-24 DIAGNOSIS — E785 Hyperlipidemia, unspecified: Secondary | ICD-10-CM | POA: Insufficient documentation

## 2024-05-24 NOTE — Assessment & Plan Note (Signed)
 covid vaccine prev done.   Tetanus 2020 Flu 2025 PNA d/w pt.   Shingles prev done.  J pouch eval by GI 2025 Prostate cancer screening and PSA options (with potential risks and benefits of testing vs not testing) were discussed along with recent recs/guidelines.  He declined testing PSA at this point. Living will d/w pt.  Wife designated if patient were incapacitated.   Diet and exercise d/w pt.  HIV and HCV prev checked 2017.

## 2024-05-24 NOTE — Assessment & Plan Note (Signed)
 Living will d/w pt.  Wife designated if patient were incapacitated.   ?

## 2024-05-24 NOTE — Assessment & Plan Note (Signed)
 Fructosamine converts to an A1c of approximately 6.7. Labs d/w pt.   Reasonable control based on home readings.  Continue with Jardiance  glimepiride  metformin  and Mounjaro .  See after visit summary.

## 2024-05-24 NOTE — Assessment & Plan Note (Signed)
 History of B12 deficiency.  Level normal.  Continue replacement as is.

## 2024-05-24 NOTE — Assessment & Plan Note (Signed)
Compliant.  TSH wnl.  No ADE on med.  Continue levothyroxine as is.

## 2024-05-24 NOTE — Assessment & Plan Note (Signed)
Continue atorvastatin.  Continue work on diet and exercise. 

## 2024-06-03 ENCOUNTER — Ambulatory Visit: Admitting: Internal Medicine

## 2024-06-16 ENCOUNTER — Inpatient Hospital Stay: Payer: BC Managed Care – PPO | Attending: Oncology | Admitting: Oncology

## 2024-06-16 ENCOUNTER — Encounter (HOSPITAL_COMMUNITY): Payer: Self-pay | Admitting: Gastroenterology

## 2024-06-16 VITALS — BP 140/73 | HR 90 | Temp 98.2°F | Resp 18 | Ht 68.0 in | Wt 230.9 lb

## 2024-06-16 DIAGNOSIS — C180 Malignant neoplasm of cecum: Secondary | ICD-10-CM | POA: Insufficient documentation

## 2024-06-16 DIAGNOSIS — Z8719 Personal history of other diseases of the digestive system: Secondary | ICD-10-CM | POA: Insufficient documentation

## 2024-06-16 DIAGNOSIS — D126 Benign neoplasm of colon, unspecified: Secondary | ICD-10-CM | POA: Diagnosis not present

## 2024-06-16 DIAGNOSIS — E119 Type 2 diabetes mellitus without complications: Secondary | ICD-10-CM | POA: Diagnosis not present

## 2024-06-16 DIAGNOSIS — K5140 Inflammatory polyps of colon without complications: Secondary | ICD-10-CM | POA: Insufficient documentation

## 2024-06-16 DIAGNOSIS — Z79899 Other long term (current) drug therapy: Secondary | ICD-10-CM | POA: Insufficient documentation

## 2024-06-16 NOTE — Progress Notes (Signed)
  [facility] OFFICE PROGRESS NOTE   Diagnosis: Colon cancer  INTERVAL HISTORY:   [name] returns as scheduled.  He feels well.  Good appetite.  He is working.  He has frequent bowel movements.  No bleeding.  He is scheduled for upper and lower endoscopic surveillance this week.  He underwent a pouchoscopy and upper endoscopy in February.  Polyps were removed from the duodenum and rectum.  Objective:  Vital signs in last 24 hours:  Blood pressure (!) 140/73, pulse 90, temperature 98.2 F (36.8 C), temperature source Temporal, resp. rate 18, height 5' 8 (1.727 m), weight 230 lb 14.4 oz (104.7 kg), SpO2 100%.   Lymphatics: No cervical, supraclavicular, axillary, or inguinal nodes Resp: Lungs clear bilaterally Cardio: Regular rate and rhythm GI: No hepatosplenomegaly, nontender, no mass Vascular: No leg edema  Lab Results:  Lab Results  Component Value Date   WBC 4.7 05/06/2024   HGB 13.1 05/06/2024   HCT 39.7 05/06/2024   MCV 91.4 05/06/2024   PLT 215.0 05/06/2024   NEUTROABS 2.6 05/06/2024    CMP  Lab Results  Component Value Date   NA 136 05/06/2024   K 4.4 05/06/2024   CL 102 05/06/2024   CO2 27 05/06/2024   GLUCOSE 106 (H) 05/06/2024   BUN 24 (H) 05/06/2024   CREATININE 1.25 05/06/2024   CALCIUM  9.4 05/06/2024   PROT 6.8 05/06/2024   ALBUMIN 3.9 05/06/2024   AST 13 05/06/2024   ALT 11 05/06/2024   ALKPHOS 61 05/06/2024   BILITOT 0.5 05/06/2024   GFRNONAA 68 (L) 09/22/2012   GFRAA 79 (L) 09/22/2012    Lab Results  Component Value Date   [name]1 1.45 06/16/2018   [name] <0.5 06/15/2016     Medications: I have reviewed the patient's current medications.   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 Ileostomy takedown 09/18/2012.   2. Familial polyposis with multiple adenomatous polyps  noted on the colectomy specimen 05/20/2012 -followed by Dr. Wilhelmenia and Dr. Debby for surveillance of the J-pouch 3. Left thyroid  low-grade Hurthle cell neoplasm, status post a left thyroidectomy on 05/20/2012.   4. Diabetes.   5. Status post endoscopic removal of a duodenal polyp at [facility] on 06/08/2013 and 10/25/2014, removal of adenoma at the minor papilla at [facility] 05/21/2017, duodenal polyps at [facility] 11/26/2017-adenomatous polyps removed near the papilla, [facility] EGD 02/09/2019 21-2 mm mucosal nodules in the first portion of the duodenum biopsy-chronic active duodenitis, negative for dysplasia Upper endoscopy 02/05/2022-soft inflamed nodule below the GE junction removed-exam otherwise normal, biopsy negative for intestinal metaplasia or dysplasia Upper endoscopy 12/17/2022-inflammatory polyp at the GE junction Upper endoscopy 09/30/2023-inflammatory polyp at the GE junction, polyps removed from the duodenum-duodenal adenoma-low-grade dysplasia     Disposition: Mr. Cassaday is in clinical remission from colon cancer.  He has familial polyposis.  He continues pouchoscopy and upper endoscopic surveillance with Dr. Wilhelmenia.  He would like to continue follow-up in the medical oncology clinic.  He will return for an office visit in 1 year.  Arley Hof, MD  06/16/2024  10:52 AM

## 2024-06-16 NOTE — Progress Notes (Signed)
 Attempted to obtain medical history for pre op call via telephone, unable to reach at this time. HIPAA compliant voicemail message left requesting return call to pre surgical testing department.

## 2024-06-18 ENCOUNTER — Telehealth: Payer: Self-pay

## 2024-06-18 NOTE — Telephone Encounter (Signed)
 Procedure:COLON Procedure date: 06/22/24 Procedure location: [facility] Arrival Time: 8:23 Spoke with the patient Y/N: Y Any prep concerns? N  Has the patient obtained the prep from the pharmacy ? Y Do you have a care partner and transportation: Y Any additional concerns? N

## 2024-06-23 ENCOUNTER — Ambulatory Visit (HOSPITAL_COMMUNITY): Admitting: Anesthesiology

## 2024-06-23 ENCOUNTER — Encounter (HOSPITAL_COMMUNITY)
Admission: RE | Disposition: A | Discharge status: Home / Self Care | Payer: Self-pay | Source: Home / Self Care | Attending: Gastroenterology

## 2024-06-23 ENCOUNTER — Other Ambulatory Visit: Payer: Self-pay

## 2024-06-23 ENCOUNTER — Encounter (HOSPITAL_COMMUNITY): Payer: Self-pay | Admitting: Gastroenterology

## 2024-06-23 ENCOUNTER — Ambulatory Visit (HOSPITAL_COMMUNITY)
Admission: RE | Admit: 2024-06-23 | Discharge: 2024-06-23 | Disposition: A | Discharge status: Home / Self Care | Attending: Gastroenterology | Admitting: Gastroenterology

## 2024-06-23 DIAGNOSIS — Z09 Encounter for follow-up examination after completed treatment for conditions other than malignant neoplasm: Secondary | ICD-10-CM | POA: Diagnosis not present

## 2024-06-23 DIAGNOSIS — Z1509 Genetic susceptibility to other malignant neoplasm: Secondary | ICD-10-CM | POA: Diagnosis not present

## 2024-06-23 DIAGNOSIS — I10 Essential (primary) hypertension: Secondary | ICD-10-CM | POA: Diagnosis not present

## 2024-06-23 DIAGNOSIS — Z7985 Long-term (current) use of injectable non-insulin antidiabetic drugs: Secondary | ICD-10-CM | POA: Insufficient documentation

## 2024-06-23 DIAGNOSIS — E119 Type 2 diabetes mellitus without complications: Secondary | ICD-10-CM | POA: Insufficient documentation

## 2024-06-23 DIAGNOSIS — R599 Enlarged lymph nodes, unspecified: Secondary | ICD-10-CM | POA: Insufficient documentation

## 2024-06-23 DIAGNOSIS — D129 Benign neoplasm of anus and anal canal: Secondary | ICD-10-CM

## 2024-06-23 DIAGNOSIS — D1391 Familial adenomatous polyposis: Secondary | ICD-10-CM | POA: Diagnosis not present

## 2024-06-23 DIAGNOSIS — E039 Hypothyroidism, unspecified: Secondary | ICD-10-CM | POA: Insufficient documentation

## 2024-06-23 DIAGNOSIS — Z860101 Personal history of adenomatous and serrated colon polyps: Secondary | ICD-10-CM | POA: Diagnosis not present

## 2024-06-23 DIAGNOSIS — Z85038 Personal history of other malignant neoplasm of large intestine: Secondary | ICD-10-CM | POA: Diagnosis not present

## 2024-06-23 DIAGNOSIS — Z7984 Long term (current) use of oral hypoglycemic drugs: Secondary | ICD-10-CM | POA: Insufficient documentation

## 2024-06-23 DIAGNOSIS — Z980 Intestinal bypass and anastomosis status: Secondary | ICD-10-CM | POA: Diagnosis not present

## 2024-06-23 DIAGNOSIS — K2289 Other specified disease of esophagus: Secondary | ICD-10-CM | POA: Diagnosis not present

## 2024-06-23 DIAGNOSIS — Z87891 Personal history of nicotine dependence: Secondary | ICD-10-CM | POA: Diagnosis not present

## 2024-06-23 DIAGNOSIS — D126 Benign neoplasm of colon, unspecified: Secondary | ICD-10-CM | POA: Diagnosis not present

## 2024-06-23 DIAGNOSIS — K624 Stenosis of anus and rectum: Secondary | ICD-10-CM | POA: Insufficient documentation

## 2024-06-23 DIAGNOSIS — K3189 Other diseases of stomach and duodenum: Secondary | ICD-10-CM | POA: Diagnosis not present

## 2024-06-23 DIAGNOSIS — K317 Polyp of stomach and duodenum: Secondary | ICD-10-CM | POA: Diagnosis not present

## 2024-06-23 DIAGNOSIS — D132 Benign neoplasm of duodenum: Secondary | ICD-10-CM

## 2024-06-23 DIAGNOSIS — Z9889 Other specified postprocedural states: Secondary | ICD-10-CM

## 2024-06-23 DIAGNOSIS — D1339 Benign neoplasm of other parts of small intestine: Secondary | ICD-10-CM | POA: Diagnosis not present

## 2024-06-23 DIAGNOSIS — K209 Esophagitis, unspecified without bleeding: Secondary | ICD-10-CM | POA: Diagnosis present

## 2024-06-23 HISTORY — PX: COLONOSCOPY: SHX5424

## 2024-06-23 HISTORY — PX: POLYPECTOMY: SHX149

## 2024-06-23 HISTORY — PX: ESOPHAGOGASTRODUODENOSCOPY: SHX5428

## 2024-06-23 LAB — GLUCOSE, CAPILLARY: Glucose-Capillary: 196 mg/dL — ABNORMAL HIGH (ref 70–99)

## 2024-06-23 SURGERY — COLONOSCOPY
Anesthesia: Monitor Anesthesia Care

## 2024-06-23 MED ORDER — PROPOFOL 500 MG/50ML IV EMUL
INTRAVENOUS | Status: DC | PRN
  Administered 2024-06-23: 50 mg via INTRAVENOUS
  Administered 2024-06-23: 100 ug/kg/min via INTRAVENOUS
  Administered 2024-06-23: 50 mg via INTRAVENOUS

## 2024-06-23 MED ORDER — CIPROFLOXACIN HCL 500 MG PO TABS: 500.0000 mg | ORAL_TABLET | Freq: Two times a day (BID) | ORAL | 0 refills | Status: AC

## 2024-06-23 MED ORDER — LIDOCAINE 2% (20 MG/ML) 5 ML SYRINGE
INTRAMUSCULAR | Status: DC | PRN
  Administered 2024-06-23: 60 mg via INTRAVENOUS

## 2024-06-23 MED ORDER — DEXMEDETOMIDINE HCL IN NACL 80 MCG/20ML IV SOLN
INTRAVENOUS | Status: DC | PRN
  Administered 2024-06-23: 12 ug via INTRAVENOUS

## 2024-06-23 MED ORDER — SODIUM CHLORIDE 0.9 % IV SOLN: INTRAVENOUS | Status: DC

## 2024-06-23 MED ORDER — PHENYLEPHRINE HCL (PRESSORS) 10 MG/ML IV SOLN
INTRAVENOUS | Status: DC | PRN
  Administered 2024-06-23: 80 ug via INTRAVENOUS

## 2024-06-23 NOTE — Op Note (Signed)
 [facility] Patient Name: Cory Harper Procedure Date: 06/23/2024 MRN: [id number] Attending MD: Aloha Finner , MD, [id number] Date of Birth: 1955-11-12 CSN: [id number] Age: 68 Admit Type: Ambulatory Procedure:                Upper GI endoscopy Indications:              Follow-up of polyps in the duodenum, Familial                            Adenomatous Polyposis Providers:                Aloha Finner, MD, Randall Lines, RN, Fairy Marina, Technician Referring MD:              Medicines:                Monitored Anesthesia Care Complications:            No immediate complications. Estimated Blood Loss:     Estimated blood loss was minimal. Procedure:                Pre-Anesthesia Assessment:                           - Prior to the procedure, a History and Physical                            was performed, and patient medications and                            allergies were reviewed. The patient's tolerance of                            previous anesthesia was also reviewed. The risks                            and benefits of the procedure and the sedation                            options and risks were discussed with the patient.                            All questions were answered, and informed consent                            was obtained. Prior Anticoagulants: The patient has                            taken no anticoagulant or antiplatelet agents                            except for aspirin . ASA Grade Assessment: III - A  patient with severe systemic disease. After                            reviewing the risks and benefits, the patient was                            deemed in satisfactory condition to undergo the                            procedure.                           After obtaining informed consent, the endoscope was                            passed under direct vision.  Throughout the                            procedure, the patient's blood pressure, pulse, and                            oxygen saturations were monitored continuously. The                            [id number]-Q190V ([id number]) Olympus duodenoscope was                            introduced through the mouth, and advanced to the                            second part of duodenum. The upper GI endoscopy was                            accomplished without difficulty. The patient                            tolerated the procedure. Scope In: Scope Out: Findings:      No gross lesions were noted in the entire esophagus.      The Z-line was irregular and was found 41 cm from the incisors.      A single 12 mm semi-sessile inflammatory polyp with no stigmata of       recent bleeding was found in the cardia. This has previously been       biopsied and is overall stable in size.      Multiple small semi-sessile polyps with no bleeding and no stigmata of       recent bleeding were found in the cardia, in the gastric fundus and in       the gastric body. These do not have appearance of adenomatous polyps but       rather fundic gland polyps.      No other gross lesions were noted in the entire examined stomach.      Diffuse mucosal changes characterized by altered texture were found in       the duodenal bulb - Brunner gland hyperplasia/Gastric heterotopia       present.  A single 8 mm sessile polyp was found in the second portion of the       duodenum. The polyp was removed with a cold snare. Resection and       retrieval were complete.      Multiple small post polypectomy scars were found in the first portion of       the duodenum and in the second portion of the duodenum.      The major papilla was normal with a long tail (unchanged in       characteristic from priors). Impression:               - No gross lesions in the entire esophagus. Z-line                            irregular, 41 cm from the  incisors.                           - An inflammatory gastric polyp in cardia                            (non-bleeding and no stigmata).                           - Multiple gastric polyps (fundic gland in                            appearance).                           - No other gross lesions in the entire stomach.                           - Mucosal changes in the duodenum bulb -                            heterotopia v Brunner gland hyperplasia.                           - A single duodenal polyp in D2. Resected and                            retrieved.                           - Duodenal scars.                           - Normal major papilla with enlongated tail. Moderate Sedation:      Not Applicable - Patient had care per Anesthesia. Recommendation:           - Proceed to scheduled Flexible Sigmoidoscopy.                           - Observe patient's clinical course.                           - Await pathology results.                           -  Repeat upper endoscopy in 1 year for                            surveillance. If pouchoscopy is done sooner, can                            consider doing this at same time.                           - The findings and recommendations were discussed                            with the patient.                           - The findings and recommendations were discussed                            with the patient's family. Procedure Code(s):        --- Professional ---                           [id number], Esophagogastroduodenoscopy, flexible,                            transoral; with removal of tumor(s), polyp(s), or                            other lesion(s) by snare technique Diagnosis Code(s):        --- Professional ---                           K22.89, Other specified disease of esophagus                           K31.7, Polyp of stomach and duodenum                           K31.89, Other diseases of stomach and duodenum                            D12.6, Benign neoplasm of colon, unspecified                           Z15.09, Genetic susceptibility to other malignant                            neoplasm CPT copyright 2022 American Medical Association. All rights reserved. The codes documented in this report are preliminary and upon coder review may  be revised to meet current compliance requirements. Aloha Finner, MD 06/23/2024 10:44:19 AM Number of Addenda: 0

## 2024-06-23 NOTE — H&P (Signed)
 GASTROENTEROLOGY PROCEDURE H&P NOTE   Primary Care Physician: Cleatus Arlyss RAMAN, MD  HPI: Cory Harper is a 68 y.o. male who presents for EGD/Pouchoscopy for evaluation in setting of FAP and previous polyps/adenomas.  Past Medical History:  Diagnosis Date   Anemia    Arthritis    Cancer (HCC) 2013   stg II colon cancer s/p resection   Colonic mass    Diabetes mellitus    FAP (familial adenomatous polyposis)    Heart murmur    Hyperlipidemia    Hypertension    not on blood pressure meds since 11/13    Hypothyroidism    Nasal congestion    Past Surgical History:  Procedure Laterality Date   BIOPSY  01/15/2018   Procedure: BIOPSY;  Surgeon: Debby Hila, MD;  [facility];  Service: Endoscopy;;   BIOPSY  07/10/2019   Procedure: BIOPSY;  Surgeon: Debby Hila, MD;  [facility];  Service: General;;   BIOPSY  01/27/2021   Procedure: BIOPSY;  Surgeon: Debby Hila, MD;  [facility];  Service: Endoscopy;;   BIOPSY  05/10/2022   Procedure: BIOPSY;  Surgeon: Wilhelmenia Aloha Raddle., MD;  [facility];  Service: Gastroenterology;;   BIOPSY  08/02/2022   Procedure: BIOPSY;  Surgeon: Wilhelmenia Aloha Raddle., MD;  [facility];  Service: Gastroenterology;;   BIOPSY  12/17/2022   Procedure: BIOPSY;  Surgeon: Wilhelmenia Aloha Raddle., MD;  [facility];  Service: Gastroenterology;;   BOWEL RESECTION  05/20/2012   COLON RESECTION  05/20/2012   Procedure: COLON RESECTION LAPAROSCOPIC;  Surgeon: Elspeth KYM Schultze, MD;  [facility];  Service: General;  Laterality: N/A;  Laparoscopic Proctocolectomy, Ileal Pouch Anal Anastomoisis, Loop Ileostomy   ENDOSCOPIC MUCOSAL RESECTION  08/02/2022   Procedure: ENDOSCOPIC MUCOSAL RESECTION;  Surgeon: Wilhelmenia Aloha Raddle., MD;  [facility];  Service: Gastroenterology;;   ESOPHAGOGASTRODUODENOSCOPY (EGD) WITH PROPOFOL  N/A 04/23/2013   Procedure: ESOPHAGOGASTRODUODENOSCOPY  (EGD) WITH PROPOFOL ;  Surgeon: Toribio SHAUNNA Cedar, MD;  [facility];  Service: Endoscopy;  Laterality: N/A;  side viewing scope   ESOPHAGOGASTRODUODENOSCOPY (EGD) WITH PROPOFOL  N/A 12/17/2022   Procedure: ESOPHAGOGASTRODUODENOSCOPY (EGD) WITH PROPOFOL ;  Surgeon: Wilhelmenia Aloha Raddle., MD;  [facility];  Service: Gastroenterology;  Laterality: N/A;   ESOPHAGOGASTRODUODENOSCOPY (EGD) WITH PROPOFOL  N/A 09/30/2023   Procedure: ESOPHAGOGASTRODUODENOSCOPY (EGD) WITH PROPOFOL ;  Surgeon: Wilhelmenia Aloha Raddle., MD;  [facility];  Service: Gastroenterology;  Laterality: N/A;   FLEXIBLE SIGMOIDOSCOPY  08/01/2012   Procedure: FLEXIBLE SIGMOIDOSCOPY;  Surgeon: Hila Debby, MD;  [facility];  Service: Endoscopy;  Laterality: N/A;  use endoscope   FLEXIBLE SIGMOIDOSCOPY N/A 11/29/2014   Procedure: FLEXIBLE SIGMOIDOSCOPY/POUCHOSCOPY;  Surgeon: Hila Debby, MD;  [facility];  Service: Endoscopy;  Laterality: N/A;   FLEXIBLE SIGMOIDOSCOPY N/A 05/10/2022   Procedure: FLEXIBLE SIGMOIDOSCOPY;  Surgeon: Wilhelmenia Aloha Raddle., MD;  Location: [facility];  Service: Gastroenterology;  Laterality: N/A;   FLEXIBLE SIGMOIDOSCOPY N/A 12/17/2022   Procedure: FLEXIBLE SIGMOIDOSCOPY;  Surgeon: Wilhelmenia Aloha Raddle., MD;  Location: [facility];  Service: Gastroenterology;  Laterality: N/A;   FLEXIBLE SIGMOIDOSCOPY N/A 09/30/2023   Procedure: FLEXIBLE SIGMOIDOSCOPY;  Surgeon: Wilhelmenia Aloha Raddle., MD;  Location: [facility];  Service: Gastroenterology;  Laterality: N/A;   FOREIGN BODY REMOVAL  08/02/2022   Procedure: FOREIGN BODY REMOVAL;  Surgeon: Wilhelmenia Aloha Raddle., MD;  Location: [facility];  Service: Gastroenterology;;   FOREIGN BODY REMOVAL  09/30/2023   Procedure: FOREIGN BODY REMOVAL;  Surgeon: Wilhelmenia Aloha Raddle., MD;  [facility];  Service: Gastroenterology;;   ILEOSTOMY  05/20/12   ILEOSTOMY CLOSURE N/A 09/18/2012   Procedure: Loop  Ileostomy Takedown with EUA ;  Surgeon: Elspeth KYM Schultze, MD;  [facility];  Service: General;  Laterality: N/A;  loop ileostomy takedown with EUA    KNEE ARTHROSCOPY  1998   Right knee   POLYPECTOMY  05/10/2022   Procedure: POLYPECTOMY;  Surgeon: Wilhelmenia Aloha Raddle., MD;  Location: [facility];  Service: Gastroenterology;;   POLYPECTOMY  08/02/2022   Procedure: POLYPECTOMY;  Surgeon: Wilhelmenia Aloha Raddle., MD;  Location: [facility];  Service: Gastroenterology;;   POLYPECTOMY  12/17/2022   Procedure: POLYPECTOMY;  Surgeon: Wilhelmenia Aloha Raddle., MD;  Location: [facility];  Service: Gastroenterology;;   POLYPECTOMY  09/30/2023   Procedure: POLYPECTOMY;  Surgeon: Wilhelmenia Aloha Raddle., MD;  Location: [facility];  Service: Gastroenterology;;   POUCHOSCOPY  11/16/2013   Procedure: POUCHOSCOPY;  Surgeon: Bernarda Ned, MD;  [facility];  Service: Endoscopy;;   POUCHOSCOPY N/A 11/16/2015   Procedure: POUCHOSCOPY;  Surgeon: Bernarda Ned, MD;  [facility];  Service: Endoscopy;  Laterality: N/A;   POUCHOSCOPY N/A 12/20/2016   Procedure: POUCHOSCOPY;  Surgeon: Ned Bernarda, MD;  [facility];  Service: Endoscopy;  Laterality: N/A;   POUCHOSCOPY N/A 01/15/2018   Procedure: POUCHOSCOPY;  Surgeon: Ned Bernarda, MD;  [facility];  Service: Endoscopy;  Laterality: N/A;   POUCHOSCOPY N/A 07/10/2019   Procedure: POUCHOSCOPY;  Surgeon: Ned Bernarda, MD;  Location: [facility];  Service: General;  Laterality: N/A;   POUCHOSCOPY N/A 01/27/2021   Procedure: POUCHOSCOPY;  Surgeon: Ned Bernarda, MD;  [facility];  Service: Endoscopy;  Laterality: N/A;   POUCHOSCOPY N/A 08/02/2022   Procedure: POUCHOSCOPY;  Surgeon: Mansouraty, Aloha Raddle., MD;  Location: [facility];  Service: Gastroenterology;  Laterality: N/A;   THYROID  LOBECTOMY  05/20/2012   Procedure: THYROID  LOBECTOMY;  Surgeon: Elspeth KYM Schultze, MD;  [facility];  Service:  General;  Laterality: Left;  LEFT THYROID  LOBECTOMY   UPPER GASTROINTESTINAL ENDOSCOPY     WISDOM TOOTH EXTRACTION     Current Facility-Administered Medications  Medication Dose Route Frequency Provider Last Rate Last Admin   0.9 %  sodium chloride  infusion   Intravenous Continuous Mansouraty, Aloha Raddle., MD        Current Facility-Administered Medications:    0.9 %  sodium chloride  infusion, , Intravenous, Continuous, Mansouraty, Aloha Raddle., MD Allergies  Allergen Reactions   Ibuprofen Swelling    Made his joints swell with high doses   Family History  Problem Relation Age of Onset   Uterine cancer Mother    Hypertension Mother    Stroke Mother    Colon cancer Mother    Colon polyps Mother    Cancer Mother        colon, endometrial   Diabetes Father    Obesity Father    Pneumonia Father    Cancer Father        skin - squamous    Breast cancer Sister    Diabetes Paternal Grandmother    Prostate cancer Neg Hx    Esophageal cancer Neg Hx    Inflammatory bowel disease Neg Hx    Liver disease Neg Hx    Pancreatic cancer Neg Hx    Rectal cancer Neg Hx    Social History   Socioeconomic History   Marital status: Married    Spouse name: June   Number of children: 0   Years of  education: Not on file   Highest education level: Not on file  Occupational History   Occupation: bus driver    Employer: [employer]    Comment: 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, 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  Tobacco Use   Smoking status: Former    Current packs/day: 0.00    Types: Cigarettes    Quit date: 08/07/1991    Years since quitting: 32.9   Smokeless tobacco: Never   Tobacco comments:    quit in 1993  Vaping Use   Vaping status: Never Used  Substance and Sexual Activity   Alcohol use: No   Drug use: No   Sexual activity: Not on file  Other Topics Concern   Not on file  Social History Narrative   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 GEN: NAD EYE: Sclerae anicteric ENT: MMM CV: Non-tachycardic GI: Soft, NT/ND NEURO:  Alert & Oriented x 3  Lab Results: No results for input(s): WBC, HGB, HCT, PLT in the last 72 hours. BMET No results for input(s): NA, K, CL, CO2, GLUCOSE, BUN, CREATININE, CALCIUM  in the last 72 hours. LFT No results for input(s): PROT, ALBUMIN, AST, ALT, ALKPHOS, BILITOT, BILIDIR, IBILI in the last 72 hours. PT/INR No results for input(s): LABPROT, INR in the last 72 hours.   Impression / Plan: This is a 68 y.o.male who presents for EGD/Pouchoscopy for evaluation in setting of FAP and previous polyps/adenomas.  The risks and benefits of endoscopic evaluation/treatment were discussed with the patient and/or family; these include but are not limited to the risk of perforation, infection, bleeding, missed lesions, lack of diagnosis, severe illness requiring hospitalization, as well as anesthesia and sedation related illnesses.  The patient's history has been reviewed, patient examined, no change in status, and deemed stable for procedure.  The patient and/or family was provided an opportunity to ask questions and all were answered.  The patient and/or family is agreeable to proceed.    Aloha Finner, MD [facility] Advanced [facility]oscopy Office # [phone]

## 2024-06-23 NOTE — Discharge Instructions (Signed)
YOU HAD AN ENDOSCOPIC PROCEDURE TODAY: Refer to the procedure report and other information in the discharge instructions given to you for any specific questions about what was found during the examination. If this information does not answer your questions, please call [place] office at [phone] to clarify.   YOU SHOULD EXPECT: Some feelings of bloating in the abdomen. Passage of more gas than usual. Walking can help get rid of the air that was put into your GI tract during the procedure and reduce the bloating. If you had a lower endoscopy (such as a colonoscopy or flexible sigmoidoscopy) you may notice spotting of blood in your stool or on the toilet paper. Some abdominal soreness may be present for a day or two, also.  DIET: Your first meal following the procedure should be a light meal and then it is ok to progress to your normal diet. A half-sandwich or bowl of soup is an example of a good first meal. Heavy or fried foods are harder to digest and may make you feel nauseous or bloated. Drink plenty of fluids but you should avoid alcoholic beverages for 24 hours.   ACTIVITY: Your care partner should take you home directly after the procedure. You should plan to take it easy, moving slowly for the rest of the day. You can resume normal activity the day after the procedure however YOU SHOULD NOT DRIVE, use power tools, machinery or perform tasks that involve climbing or major physical exertion for 24 hours (because of the sedation medicines used during the test).   SYMPTOMS TO REPORT IMMEDIATELY: A gastroenterologist can be reached at any hour. Please call [phone]  for any of the following symptoms:  Following lower endoscopy (colonoscopy, flexible sigmoidoscopy) Excessive amounts of blood in the stool  Significant tenderness, worsening of abdominal pains  Swelling of the abdomen that is new, acute  Fever of 100 or higher  Following upper endoscopy (EGD, EUS, ERCP, esophageal  dilation) Vomiting of blood or coffee ground material  New, significant abdominal pain  New, significant chest pain or pain under the shoulder blades  Painful or persistently difficult swallowing  New shortness of breath  Black, tarry-looking or red, bloody stools  FOLLOW UP:  If any biopsies were taken you will be contacted by phone or by letter within the next 1-3 weeks. Call [phone]  if you have not heard about the biopsies in 3 weeks.  Please also call with any specific questions about appointments or follow up tests.

## 2024-06-23 NOTE — Op Note (Signed)
 [facility] Patient Name: Cory Harper Procedure Date: 06/23/2024 MRN: [id number] Attending MD: Aloha Finner , MD, [id number] Date of Birth: Jul 13, 1956 CSN: [id number] Age: 68 Admit Type: Ambulatory Procedure:                Pouchoscopy Indications:              History of familial polyposis Providers:                Aloha Finner, MD, Randall Lines, RN, Fairy Marina, Technician Referring MD:              Medicines:                Monitored Anesthesia Care Complications:            No immediate complications. Estimated Blood Loss:     Estimated blood loss was minimal. Procedure:                Pre-Anesthesia Assessment:                           - Prior to the procedure, a History and Physical                            was performed, and patient medications and                            allergies were reviewed. The patient's tolerance of                            previous anesthesia was also reviewed. The risks                            and benefits of the procedure and the sedation                            options and risks were discussed with the patient.                            All questions were answered, and informed consent                            was obtained. Prior Anticoagulants: The patient has                            taken no anticoagulant or antiplatelet agents                            except for aspirin . ASA Grade Assessment: III - A                            patient with severe systemic disease. After  reviewing the risks and benefits, the patient was                            deemed in satisfactory condition to undergo the                            procedure.                           After obtaining informed consent, the endoscope was                            passed under direct vision. Throughout the                            procedure, the patient's blood  pressure, pulse, and                            oxygen saturations were monitored continuously. The                            [id number] ([id number]) Olympus endoscope was                            introduced through the anus and advanced to the the                            ileoanal pouch and into the neo-terminal ileum.                            After obtaining informed consent, the endoscope was                            passed under direct vision. Throughout the                            procedure, the patient's blood pressure, pulse, and                            oxygen saturations were monitored continuously.The                            procedure was performed without difficulty. The                            patient tolerated the procedure. The quality of the                            bowel preparation was adequate. Scope In: 10:12:59 AM Scope Out: 10:34:26 AM Total Procedure Duration: 0 hours 21 minutes 27 seconds  Findings:      The digital rectal exam findings include rectal/anal stricture.       Pertinent negatives include no palpable rectal lesions.      A large amount of semi-liquid stool was found in the ileoanal pouch,  making visualization difficult. Lavage of the area was performed using       copious amounts, resulting in clearance with adequate visualization.      The neo-terminal ileum appeared normal.      The ileoanal pouch contained greater than 50 sessile, non-bleeding       polyps. The polyps were 1 to 10 mm in diameter. 33 of these polyps were       removed with a cold snare. Resection and retrieval were complete.      There was a rectal cuff beginning at at 1 cm from the anal verge,       characterized by healthy appearing mucosa. Impression:               - Rectal/anal stricture found on digital rectal                            exam.                           - Stool in the ileoanal pouch. Lavaged with                            adequate  visualization.                           - The examined portion of the ileum was normal.                           - Greater than 50 polyps in the ileoanal pouch. 33                            of these removed with a cold snare. Resected and                            retrieved.                           - Rectal cuff with healthy appearing mucosa seen. Recommendation:           - The patient will be observed post-procedure,                            until all discharge criteria are met.                           - Discharge patient to home.                           - Patient has a contact number available for                            emergencies. The signs and symptoms of potential                            delayed complications were discussed with the  patient. Return to normal activities tomorrow.                            Written discharge instructions were provided to the                            patient.                           - Resume previous diet.                           - Await pathology results.                           - Repeat post-surgical lower GI endoscopy in 6-12                            months for surveillance is reasonable (polyp burden                            is under better control at this time).                           - Ciprofloxacin  500 mg twice daily for 3-days to                            decrease infectious risk.                           - The findings and recommendations were discussed                            with the patient.                           - The findings and recommendations were discussed                            with the patient's family. 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 All rights reserved. The codes documented in this report are preliminary and upon coder review may  be revised to meet current compliance requirements. Aloha Finner, MD 06/23/2024 10:51:54 AM Number of Addenda: 0

## 2024-06-23 NOTE — Anesthesia Preprocedure Evaluation (Addendum)
 Anesthesia Evaluation  Patient identified by MRN, date of birth, ID band Patient awake    Reviewed: Allergy & Precautions, NPO status , Patient's Chart, lab work & pertinent test results  Airway Mallampati: II  TM Distance: >3 FB Neck ROM: Full    Dental no notable dental hx.    Pulmonary former smoker   Pulmonary exam normal        Cardiovascular hypertension, Normal cardiovascular exam     Neuro/Psych    GI/Hepatic negative GI ROS, Neg liver ROS,,,  Endo/Other  diabetes, Oral Hypoglycemic AgentsHypothyroidism  Patient on GLP-1 Agonist  Renal/GU negative Renal ROS     Musculoskeletal   Abdominal  (+) + obese  Peds  Hematology negative hematology ROS (+)   Anesthesia Other Findings History of colon cancer  Esophagitis    Reproductive/Obstetrics                              Anesthesia Physical Anesthesia Plan  ASA: 3  Anesthesia Plan: MAC   Post-op Pain Management:    Induction:   PONV Risk Score and Plan: 1 and Propofol  infusion and Treatment may vary due to age or medical condition  Airway Management Planned: Nasal Cannula  Additional Equipment:   Intra-op Plan:   Post-operative Plan:   Informed Consent: I have reviewed the patients History and Physical, chart, labs and discussed the procedure including the risks, benefits and alternatives for the proposed anesthesia with the patient or authorized representative who has indicated his/her understanding and acceptance.     Dental advisory given  Plan Discussed with: CRNA  Anesthesia Plan Comments:          Anesthesia Quick Evaluation

## 2024-06-23 NOTE — Transfer of Care (Signed)
 Immediate Anesthesia Transfer of Care Note  Patient: Cory Harper  Procedure(s) Performed: COLONOSCOPY EGD (ESOPHAGOGASTRODUODENOSCOPY) POLYPECTOMY, INTESTINE  Patient Location: PACU  Anesthesia Type:MAC  Level of Consciousness: drowsy  Airway & Oxygen Therapy: Patient Spontanous Breathing and Patient connected to face mask oxygen  Post-op Assessment: Report given to RN and Post -op Vital signs reviewed and stable  Post vital signs: Reviewed and stable  Last Vitals:  Vitals Value Taken Time  BP 127/70 06/23/24 10:40  Temp 36.4 C 06/23/24 10:39  Pulse 76 06/23/24 10:44  Resp 12 06/23/24 10:44  SpO2 99 % 06/23/24 10:44  Vitals shown include unfiled device data.  Last Pain:  Vitals:   06/23/24 1039  TempSrc: Temporal  PainSc: Asleep         Complications: No notable events documented.

## 2024-06-24 NOTE — Anesthesia Postprocedure Evaluation (Signed)
 Anesthesia Post Note  Patient: Cory Harper  Procedure(s) Performed: COLONOSCOPY EGD (ESOPHAGOGASTRODUODENOSCOPY) POLYPECTOMY, INTESTINE     Patient location during evaluation: Endoscopy Anesthesia Type: MAC Level of consciousness: awake Pain management: pain level controlled Vital Signs Assessment: post-procedure vital signs reviewed and stable Respiratory status: spontaneous breathing, nonlabored ventilation and respiratory function stable Cardiovascular status: blood pressure returned to baseline and stable Postop Assessment: no apparent nausea or vomiting Anesthetic complications: no   No notable events documented.  Last Vitals:  Vitals:   06/23/24 1040 06/23/24 1050  BP: 127/70 114/62  Pulse: 79 75  Resp: 13 15  Temp:    SpO2: 97% 99%    Last Pain:  Vitals:   06/23/24 1050  TempSrc:   PainSc: 0-No pain                 Dameer Speiser P Armour Villanueva

## 2024-06-25 ENCOUNTER — Encounter (HOSPITAL_COMMUNITY): Payer: Self-pay | Admitting: Gastroenterology

## 2024-06-25 LAB — SURGICAL PATHOLOGY

## 2024-06-28 ENCOUNTER — Ambulatory Visit: Payer: Self-pay | Admitting: Family Medicine

## 2024-06-29 ENCOUNTER — Encounter: Payer: Self-pay | Admitting: Gastroenterology

## 2024-07-01 ENCOUNTER — Ambulatory Visit: Admitting: Internal Medicine

## 2024-07-01 ENCOUNTER — Encounter: Payer: Self-pay | Admitting: Internal Medicine

## 2024-07-01 VITALS — BP 124/64 | HR 87 | Ht 68.0 in | Wt 229.0 lb

## 2024-07-01 DIAGNOSIS — E1122 Type 2 diabetes mellitus with diabetic chronic kidney disease: Secondary | ICD-10-CM

## 2024-07-01 DIAGNOSIS — N182 Chronic kidney disease, stage 2 (mild): Secondary | ICD-10-CM | POA: Diagnosis not present

## 2024-07-01 DIAGNOSIS — D34 Benign neoplasm of thyroid gland: Secondary | ICD-10-CM

## 2024-07-01 DIAGNOSIS — E66811 Obesity, class 1: Secondary | ICD-10-CM

## 2024-07-01 DIAGNOSIS — E890 Postprocedural hypothyroidism: Secondary | ICD-10-CM

## 2024-07-01 DIAGNOSIS — Z7985 Long-term (current) use of injectable non-insulin antidiabetic drugs: Secondary | ICD-10-CM

## 2024-07-01 DIAGNOSIS — Z7984 Long term (current) use of oral hypoglycemic drugs: Secondary | ICD-10-CM

## 2024-07-01 NOTE — Patient Instructions (Addendum)
 Please continue: - Metformin  2000 mg with dinner - Glimepiride  4 mg in am and 2 mg in pm - Jardiance  10 mg daily at night - Mounjaro  10 mg weekly   Try to write down possible reasons for the higher blood sugars in am.  Check some sugars at bedtime.  Try to use the stationary bicycle consistently.   Please continue levothyroxine  100 mcg daily.   Take the thyroid  hormone every day, with water, at least 30 minutes before breakfast, separated by at least 4 hours from: - acid reflux medications - calcium  - iron  - multivitamins   Please return in 4-6 months with your sugar log.

## 2024-07-01 NOTE — Progress Notes (Signed)
 Patient ID: Cory Harper, male   DOB: 15-Jan-1956, 68 y.o.   MRN: [id number]   HPI: Cory Harper is a 68 y.o.-year-old male, returning for follow-up for DM2, dx in 1998, non-insulin -dependent, uncontrolled, with long term complications (+ mild CKD).  Last visit 7 months ago  Interim history: No increased urination, blurry vision, nausea, chest pain. 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  Still very busy but since last visit, he started to exercise some on hills stationary bike.  However, not consistently as he continues to work long hours: Wakes up at 3:30 am, starts the day at 4:45 am >> dinner is usually between 7 and 7:30 PM.  He plans to retire in 1.5-2 years.  Reviewed his HbA1c levels: 05/06/2024: A1c calculated from fructosamine is 6.7% Lab Results  Component Value Date   HGBA1C 9.7 (H) 05/06/2024   HGBA1C 10.4 (H) 10/14/2023   HGBA1C 11.1 (A) 12/24/2022  10/14/2023: HbA1c calculated from fructosamine is 7.1% 04/16/2023: HbA1c calculated from fructosamine is 7.1% 07/10/2022: HbA1c calculated from fructosamine was 9.2%, higher 09/01/2021: HbA1c calculated from fructosamine is 8.36%, slightly higher than before.   06/13/2021: HbA1c calculated from fructosamine is 7.9% 12/06/2020: HbA1c calculated from fructosamine is 7.8%, higher than before, but lower than the directly measured HbA1c. 04/01/2020: HbA1c calculated from fructosamine is 7.4%, correlating better with your sugars at home. 11/18/2019: HbA1c calculated from fructosamine is 7.5%. 06/17/2019: HbA1c calculated from fructosamine is 7.0%! 05/22/2018: HbA1c calculated from the fructosamine is much better than the measured one, at 7.6%.  He is on: - Metformin  1000 mg 2x a day with meals >> 2000 mg with dinner - Amaryl  4 mg in am and 2 mg in pm - Jardiance  10 mg daily at night to avoid daytime urinary frequency-started 03/2017 - Trulicity  4.5 mg weekly >> Mounjaro  5 mg weekly >> off >> Mounjaro  5 (started  02/2023) >> 7.5 mg weekly (started 03/2023) Tried Actos >> signif. Weight gain On Cinnamon. He was on Januvia , Ozempic . He cannot be on insulin  injections as he is in school bus driver.   Pt checks his sugars 1-2 times a day -he declined a CGM: - am: 125, 156-180 >> 86, 127-180 >> 136-180, 192 >> 86, 108, 125-180 - 2h after b'fast:  n/c >> 158-220 >> 128-180 >> n/c - before lunch: 161-165 >> 172-180 >> n/c >> 129-180 >> 170-175 - 2h after lunch: 80, 183 >> 175-180 >> 178, 179 >> n/c - before dinner: 123 >> n/c >> 151, 160-164 >> n/c  - 2h after dinner:160, 161 >> 180 >> 171-178 >> 175, 180 >> n/c - bedtime: n/c>> 158, 166 >> 165 >> 169 >> n/c - nighttime: n/c >> 180 >> n/c Lowest sugar was  86 >> 129 >> 86 (fasting for colonoscopy); it is unclear at which CBG level he has hypoglycemia awareness. Highest sugar was 220 >> 180 >> 192 >> 180.  Glucometer: One Touch Ultra mini>> One Touch Ultra  Pt's meals are: - Breakfast: bowl of grits, turkey bacon before starts work, - Lunch: turkey or ham+cheese wrap or tuna salad - Dinner: baked chicken or pork chop, green beans - Snacks: crackers, desserts  -+ Mild CKD, last BUN/creatinine:  Lab Results  Component Value Date   BUN 24 (H) 05/06/2024   BUN 22 04/16/2023   CREATININE 1.25 05/06/2024   CREATININE 1.14 04/16/2023   Lab Results  Component Value Date   GFRNONAA 68 (L) 09/22/2012   Lab Results  Component Value Date   MICRALBCREAT NOTE 11/26/2023   MICRALBCREAT 6.7 09/13/2009   MICRALBCREAT 3.8 04/08/2008   + HL; last set of lipids: Lab Results  Component Value Date   CHOL 122 05/06/2024   HDL 40.20 05/06/2024   LDLCALC 45 05/06/2024   LDLDIRECT 54.0 07/10/2022   TRIG 184.0 (H) 05/06/2024   CHOLHDL 3 05/06/2024  On Lipitor 20. On ASA 81.  - last eye exam was 04/16/2024: No DR.  -No numbness and tingling in his feet.  Last foot exam 05/22/2024.  Pt has no FH of DM.  He has familial polyposis and has a history of  colon cancer >> has a J-pouch - he had a pouchoscopy in 07/2019.  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  Thyroid  ultrasound (02/2018): 1. Post left thyroid  lobectomy without evidence of locally residual or locally recurrent disease. 2. Normal appearance of the remaining thyroid  parenchyma.  Pt denies: - feeling nodules in neck - hoarseness - dysphagia - choking  Postsurgical hypothyroidism:  Pt is on levothyroxine  100 mcg daily, taken: - in am - fasting - at least 30 min from b'fast - no Ca, MVI, PPIs - on Fe 2x a day - am (30 min after LT4) and evening >> moved later in the day - not on Biotin  Reviewed his TFTs: Lab Results  Component Value Date   TSH 3.29 05/06/2024   TSH 2.85 04/16/2023   TSH 3.70 07/10/2022   TSH 3.59 12/27/2020   TSH 4.12 01/26/2020   TSH 3.33 06/17/2019   TSH 3.01 05/22/2018   TSH 3.46 07/25/2017   TSH 2.29 06/27/2016   TSH 1.65 06/14/2015   ROS: + see HPI + leg swelling  I reviewed pt's medications, allergies, PMH, social hx, family hx, and changes were documented in the history of present illness. Otherwise, unchanged from my initial visit note.  Past Medical History:  Diagnosis Date   Anemia    Arthritis    Cancer (HCC) 2013   stg II colon cancer s/p resection   Colonic mass    Diabetes mellitus    FAP (familial adenomatous polyposis)    Heart murmur    Hyperlipidemia    Hypertension    not on blood pressure meds since 11/13    Hypothyroidism    Nasal congestion    Past Surgical History:  Procedure Laterality Date   BIOPSY  01/15/2018   Procedure: BIOPSY;  Surgeon: Debby Hila, MD;  Location: [facility];  Service: Endoscopy;;   BIOPSY  07/10/2019   Procedure: BIOPSY;  Surgeon: Debby Hila, MD;  [facility]: [facility];  Service: General;;   BIOPSY  01/27/2021   Procedure: BIOPSY;  Surgeon: Debby Hila, MD;  Location:  [facility];  Service: Endoscopy;;   BIOPSY  05/10/2022   Procedure: BIOPSY;  Surgeon: Wilhelmenia Aloha Raddle., MD;  Location: [facility];  Service: Gastroenterology;;   BIOPSY  08/02/2022   Procedure: BIOPSY;  Surgeon: Wilhelmenia Aloha Raddle., MD;  [facility];  Service: Gastroenterology;;   BIOPSY  12/17/2022   Procedure: BIOPSY;  Surgeon: Wilhelmenia Aloha Raddle., MD;  [facility];  Service: Gastroenterology;;   BOWEL RESECTION  05/20/2012   COLON RESECTION  05/20/2012   Procedure: COLON RESECTION LAPAROSCOPIC;  Surgeon: Elspeth KYM Schultze, MD;  [facility]: [facility];  Service: General;  Laterality: N/A;  Laparoscopic Proctocolectomy, Ileal Pouch Anal Anastomoisis, Loop Ileostomy   COLONOSCOPY  N/A 06/23/2024   Procedure: COLONOSCOPY;  Surgeon: Wilhelmenia Aloha Raddle., MD;  Location: [facility];  Service: Gastroenterology;  Laterality: N/A;   ENDOSCOPIC MUCOSAL RESECTION  08/02/2022   Procedure: ENDOSCOPIC MUCOSAL RESECTION;  Surgeon: Wilhelmenia Aloha Raddle., MD;  [facility];  Service: Gastroenterology;;   ESOPHAGOGASTRODUODENOSCOPY N/A 06/23/2024   Procedure: EGD (ESOPHAGOGASTRODUODENOSCOPY);  Surgeon: Wilhelmenia Aloha Raddle., MD;  Location: [facility];  Service: Gastroenterology;  Laterality: N/A;   ESOPHAGOGASTRODUODENOSCOPY (EGD) WITH PROPOFOL  N/A 04/23/2013   Procedure: ESOPHAGOGASTRODUODENOSCOPY (EGD) WITH PROPOFOL ;  Surgeon: Toribio SHAUNNA Cedar, MD;  [facility];  Service: Endoscopy;  Laterality: N/A;  side viewing scope   ESOPHAGOGASTRODUODENOSCOPY (EGD) WITH PROPOFOL  N/A 12/17/2022   Procedure: ESOPHAGOGASTRODUODENOSCOPY (EGD) WITH PROPOFOL ;  Surgeon: Wilhelmenia Aloha Raddle., MD;  [facility];  Service: Gastroenterology;  Laterality: N/A;   ESOPHAGOGASTRODUODENOSCOPY (EGD) WITH PROPOFOL  N/A 09/30/2023   Procedure: ESOPHAGOGASTRODUODENOSCOPY (EGD) WITH PROPOFOL ;  Surgeon: Wilhelmenia Aloha Raddle., MD;  [facility];  Service:  Gastroenterology;  Laterality: N/A;   FLEXIBLE SIGMOIDOSCOPY  08/01/2012   Procedure: FLEXIBLE SIGMOIDOSCOPY;  Surgeon: Bernarda Ned, MD;  [facility];  Service: Endoscopy;  Laterality: N/A;  use endoscope   FLEXIBLE SIGMOIDOSCOPY N/A 11/29/2014   Procedure: FLEXIBLE SIGMOIDOSCOPY/POUCHOSCOPY;  Surgeon: Bernarda Ned, MD;  [facility];  Service: Endoscopy;  Laterality: N/A;   FLEXIBLE SIGMOIDOSCOPY N/A 05/10/2022   Procedure: FLEXIBLE SIGMOIDOSCOPY;  Surgeon: Wilhelmenia Aloha Raddle., MD;  Location: [facility];  Service: Gastroenterology;  Laterality: N/A;   FLEXIBLE SIGMOIDOSCOPY N/A 12/17/2022   Procedure: FLEXIBLE SIGMOIDOSCOPY;  Surgeon: Wilhelmenia Aloha Raddle., MD;  Location: [facility];  Service: Gastroenterology;  Laterality: N/A;   FLEXIBLE SIGMOIDOSCOPY N/A 09/30/2023   Procedure: FLEXIBLE SIGMOIDOSCOPY;  Surgeon: Wilhelmenia Aloha Raddle., MD;  Location: [facility];  Service: Gastroenterology;  Laterality: N/A;   FOREIGN BODY REMOVAL  08/02/2022   Procedure: FOREIGN BODY REMOVAL;  Surgeon: Wilhelmenia Aloha Raddle., MD;  Location: [facility];  Service: Gastroenterology;;   FOREIGN BODY REMOVAL  09/30/2023   Procedure: FOREIGN BODY REMOVAL;  Surgeon: Wilhelmenia Aloha Raddle., MD;  Location: [facility];  Service: Gastroenterology;;   ILEOSTOMY  05/20/12   ILEOSTOMY CLOSURE N/A 09/18/2012   Procedure: Loop Ileostomy Takedown with EUA ;  Surgeon: Elspeth KYM Schultze, MD;  Location: [facility];  Service: General;  Laterality: N/A;  loop ileostomy takedown with EUA    KNEE ARTHROSCOPY  1998   Right knee   POLYPECTOMY  05/10/2022   Procedure: POLYPECTOMY;  Surgeon: Wilhelmenia Aloha Raddle., MD;  Location: [facility];  Service: Gastroenterology;;   POLYPECTOMY  08/02/2022   Procedure: POLYPECTOMY;  Surgeon: Wilhelmenia Aloha Raddle., MD;  [facility];  Service: Gastroenterology;;   POLYPECTOMY  12/17/2022   Procedure: POLYPECTOMY;  Surgeon: Wilhelmenia Aloha Raddle., MD;   [facility];  Service: Gastroenterology;;   POLYPECTOMY  09/30/2023   Procedure: POLYPECTOMY;  Surgeon: Wilhelmenia Aloha Raddle., MD;  [facility];  Service: Gastroenterology;;   POLYPECTOMY  06/23/2024   Procedure: POLYPECTOMY, INTESTINE;  Surgeon: Wilhelmenia Aloha Raddle., MD;  Location: [facility];  Service: Gastroenterology;;   POUCHOSCOPY  11/16/2013   Procedure: POUCHOSCOPY;  Surgeon: Bernarda Ned, MD;  [facility];  Service: Endoscopy;;   POUCHOSCOPY N/A 11/16/2015   Procedure: POUCHOSCOPY;  Surgeon: Bernarda Ned, MD;  [facility];  Service: Endoscopy;  Laterality: N/A;   POUCHOSCOPY N/A 12/20/2016   Procedure: POUCHOSCOPY;  Surgeon: Ned Bernarda, MD;  [facility];  Service: Endoscopy;  Laterality: N/A;   POUCHOSCOPY N/A 01/15/2018  Procedure: POUCHOSCOPY;  Surgeon: Debby Hila, MD;  Location: [facility];  Service: Endoscopy;  Laterality: N/A;   POUCHOSCOPY N/A 07/10/2019   Procedure: POUCHOSCOPY;  Surgeon: Debby Hila, MD;  Location: [facility];  Service: General;  Laterality: N/A;   POUCHOSCOPY N/A 01/27/2021   Procedure: POUCHOSCOPY;  Surgeon: Debby Hila, MD;  [facility];  Service: Endoscopy;  Laterality: N/A;   POUCHOSCOPY N/A 08/02/2022   Procedure: POUCHOSCOPY;  Surgeon: Mansouraty, Aloha Raddle., MD;  Location: [facility];  Service: Gastroenterology;  Laterality: N/A;   THYROID  LOBECTOMY  05/20/2012   Procedure: THYROID  LOBECTOMY;  Surgeon: Elspeth KYM Schultze, MD;  Location[facility];  Service: General;  Laterality: Left;  LEFT THYROID  LOBECTOMY   UPPER GASTROINTESTINAL ENDOSCOPY     WISDOM TOOTH EXTRACTION     Social History   Socioeconomic History   Marital status: Married    Spouse name: June   Number of children: 0  Social Needs  Occupational History   Occupation: bus Air Traffic Controller: [employer]    Comment: 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, 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  Tobacco Use   Smoking status: Former Smoker    Last attempt to quit: 08/07/1991    Years since quitting: 25.9   Smokeless tobacco: Never Used   Tobacco comment: quit in 1993  Substance and Sexual Activity   Alcohol use: No   Drug use: No   Sexual activity: Not on file  Other Topics Concern   Not on file  Social History Narrative   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     aspirin  81 MG tablet Take 1 tablet (81 mg total) by mouth daily. 30 tablet    atorvastatin  (LIPITOR) 20 MG tablet Take 1 tablet (20 mg total) by mouth daily. 90 tablet 3   Blood Glucose Monitoring Suppl (ONE TOUCH ULTRA SYSTEM KIT) W/DEVICE KIT Dispense 1 kit with 100 lancets and strips, with 3 rf on lancets and strips.  Check sugar daily.  Dx E11.9 1 each 0   cholecalciferol  (VITAMIN D ) 1000 UNITS tablet Take 1,000 Units by mouth in the morning.     cyanocobalamin  (VITAMIN B12) 1000 MCG tablet Take 1 tablet (1,000 mcg total) by mouth 2 (two) times a week.     empagliflozin  (JARDIANCE ) 10 MG TABS tablet Take 1 tablet (10 mg total) by mouth daily. 90 tablet 3   ferrous sulfate  (FEROSUL) 325 (65 FE) MG tablet Take 1 tablet (325 mg total) by mouth daily. 90 tablet 3   fluticasone  (FLONASE ) 50 MCG/ACT nasal spray SHAKE LIQUID AND USE 2 SPRAYS IN EACH NOSTRIL DAILY 48 g 1   glimepiride  (AMARYL ) 2 MG tablet TAKE 2 TABLETS BY MOUTH IN THE MORNING THEN TAKE 1 TABLET BY MOUTH IN THE EVENING 270 tablet 3   levothyroxine  (SYNTHROID ) 100 MCG tablet Take 1 tablet (100 mcg total) by mouth daily before breakfast. 90 tablet 3   loperamide  (ANTI-DIARRHEAL) 2 MG tablet TAKE 1-2 TABLETS BY MOUTH TWICE DAILY AS NEEDED 360 tablet 3   loratadine  (CLARITIN ) 10 MG tablet Take  10 mg by mouth in the morning.     metFORMIN  (GLUCOPHAGE ) 1000 MG  tablet Take 2 tablets (2,000 mg total) by mouth daily. 180 tablet 3   omeprazole  (PRILOSEC) 40 MG capsule TAKE 1 CAPSULE(40 MG) BY MOUTH TWICE DAILY BEFORE A MEAL 60 capsule 1   ONETOUCH ULTRA test strip USE AS DIRECTED TO CHECK BLOOD GLUCOSE EVERY DAY 100 strip 2   tirzepatide  (MOUNJARO ) 10 MG/0.5ML Pen Inject 10 mg into the skin once a week. Dx Code:E11.22 6 mL 3   No current facility-administered medications on file prior to visit.   Allergies  Allergen Reactions   Ibuprofen Swelling    Made his joints swell with high doses   Family History  Problem Relation Age of Onset   Uterine cancer Mother    Hypertension Mother    Stroke Mother    Colon cancer Mother    Colon polyps Mother    Cancer Mother        colon, endometrial   Diabetes Father    Obesity Father    Pneumonia Father    Cancer Father        skin - squamous    Breast cancer Sister    Diabetes Paternal Grandmother    Prostate cancer Neg Hx    Esophageal cancer Neg Hx    Inflammatory bowel disease Neg Hx    Liver disease Neg Hx    Pancreatic cancer Neg Hx    Rectal cancer Neg Hx    PE: BP 124/64   Pulse 87   Ht 5' 8 (1.727 m)   Wt 229 lb (103.9 kg)   SpO2 98%   BMI 34.82 kg/m  Wt Readings from Last 10 Encounters:  07/01/24 229 lb (103.9 kg)  06/23/24 230 lb (104.3 kg)  06/16/24 230 lb 14.4 oz (104.7 kg)  05/22/24 228 lb (103.4 kg)  11/26/23 226 lb 3.2 oz (102.6 kg)  10/21/23 227 lb 9.6 oz (103.2 kg)  09/30/23 220 lb 0.3 oz (99.8 kg)  06/26/23 226 lb 6 oz (102.7 kg)  06/17/23 225 lb 3.2 oz (102.2 kg)  05/24/23 221 lb 12.8 oz (100.6 kg)   Constitutional: overweight, in NAD Eyes: EOMI, no exophthalmos ENT: no thyromegaly, no cervical lymphadenopathy Cardiovascular: RRR, No MRG, + R>L LE swelling (pitting) - chronic Respiratory: CTA B Musculoskeletal: no deformities Skin: no rashes Neurological: no tremor with outstretched  hands  ASSESSMENT: 1. DM2, non-insulin -dependent, uncontrolled, with complications - CKD stage 2  2. H/o Hurthle cell neoplasm  3.  Postsurgical hypothyroidism  4.  Obesity class I  PLAN:  1. Patient with longstanding, uncontrolled, type 2 diabetes, treated with oral antidiabetic regimen with metformin , sulfonylurea, and SGLT2 inhibitor along with weekly GLP-1/GIP receptor agonist.  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 - At last visit, sugars were above target in the morning, usually fluctuating in the 150s to 170s and they remained fairly stable in the rest of the day.  I would have recommended basal insulin  but he declined due to driving restrictions.  In that case, I recommended to increase the Mounjaro  dose further, to 10 mg daily.  Family advised him to start consistent exercise.  HbA1c at that time was 9.7%, but we checked a fructosamine level at the same time and the isolated HbA1c was 6.7%. -At today's visit, sugars are still mildly elevated in the morning, between 150 and 180.  He does have instances where sugars are lower, but these are rare.  He had a blood sugar of 86 in the morning when he was fasting  for his colonoscopy.  He had several other instances when the sugars were lower than 130 and we discussed about trying to write down possible reasons for the elevated blood sugars in the morning and then work on the dinners to reduce these foods.  I also advised him to check some sugars at bedtime.  We again discussed about the importance of using the stationary bike more consistently.  I recommended to exercise for half an hour when he comes home, and then have dinner and relax.  He agrees to try this. - I suggested to:  Patient Instructions  Please continue: - Metformin  2000 mg with dinner - Glimepiride  4 mg in am and 2 mg in pm - Jardiance  10 mg daily at night - Mounjaro  10 mg weekly   Try to write down possible reasons for the higher blood sugars in am.  Check  some sugars at bedtime.  Try to use the stationary bicycle consistently.   Please continue levothyroxine  100 mcg daily.   Take the thyroid  hormone every day, with water, at least 30 minutes before breakfast, separated by at least 4 hours from: - acid reflux medications - calcium  - iron  - multivitamins   Please return in 4-6 months with your sugar log.  - advised to check sugars at different times of the day - 1-2x a day, rotating check times - advised for yearly eye exams >> he is UTD - return to clinic in 4-6 months  2. H/o Hurthle cell neoplasm -Latest ultrasound report showed no suspicious masses in the lower neck.  The final left thyroid  pathology was benign -On the ultrasound from 2017, he had an enlarged lymph node, however, this was not obvious on the latest ultrasound from 2019 - No problems swallowing, choking, neck pressure - No further imaging needed unless he develops neck compression symptoms or masses felt on palpation of his neck  3.  Postsurgical hypothyroidism - latest thyroid  labs reviewed with pt. >> normal: Lab Results  Component Value Date   TSH 3.29 05/06/2024  - he continues on LT4 100 mcg daily - pt feels good on this dose. - we discussed about taking the thyroid  hormone every day, with water, >30 minutes before breakfast, separated by >4 hours from acid reflux medications, calcium , iron , multivitamins. Pt. is taking it correctly.   4.  Obesity class I - will continue the SGLT2 inhibitor and GLP-1/GIP receptor agonist, which should also help with weight loss.  We increased the dose of Mounjaro  at last visit. - He gained 5 pounds before last visit and 3 pounds since then.  Lela Fendt, MD PhD [facility] Endocr[facility]

## 2024-07-06 ENCOUNTER — Encounter: Payer: Self-pay | Admitting: Family Medicine

## 2024-07-07 MED ORDER — FLUTICASONE PROPIONATE 50 MCG/ACT NA SUSP: 2.0000 | Freq: Every day | NASAL | 0 refills | Status: AC

## 2024-07-07 NOTE — Telephone Encounter (Signed)
 E-scribed refill to [facility] [address]

## 2024-07-14 NOTE — Telephone Encounter (Signed)
 Opened in error

## 2024-11-20 ENCOUNTER — Ambulatory Visit: Admitting: Family Medicine

## 2024-12-23 ENCOUNTER — Ambulatory Visit: Admitting: Internal Medicine

## 2025-06-16 ENCOUNTER — Inpatient Hospital Stay: Admitting: Oncology
